# Patient Record
Sex: Male | Born: 1940 | Race: White | Hispanic: No | Marital: Married | Smoking: Never smoker
Health system: Southern US, Community
[De-identification: ages and names within clinical notes are randomized; demographics above are authoritative.]

## PROBLEM LIST (undated history)

## (undated) DIAGNOSIS — Z950 Presence of cardiac pacemaker: Secondary | ICD-10-CM

## (undated) DIAGNOSIS — I340 Nonrheumatic mitral (valve) insufficiency: Secondary | ICD-10-CM

## (undated) DIAGNOSIS — I341 Nonrheumatic mitral (valve) prolapse: Secondary | ICD-10-CM

## (undated) DIAGNOSIS — M1990 Unspecified osteoarthritis, unspecified site: Secondary | ICD-10-CM

## (undated) DIAGNOSIS — I10 Essential (primary) hypertension: Secondary | ICD-10-CM

## (undated) DIAGNOSIS — E785 Hyperlipidemia, unspecified: Secondary | ICD-10-CM

## (undated) DIAGNOSIS — F0280 Dementia in other diseases classified elsewhere without behavioral disturbance: Secondary | ICD-10-CM

## (undated) DIAGNOSIS — R55 Syncope and collapse: Secondary | ICD-10-CM

## (undated) DIAGNOSIS — G4733 Obstructive sleep apnea (adult) (pediatric): Secondary | ICD-10-CM

## (undated) DIAGNOSIS — J45909 Unspecified asthma, uncomplicated: Secondary | ICD-10-CM

## (undated) DIAGNOSIS — R011 Cardiac murmur, unspecified: Secondary | ICD-10-CM

## (undated) HISTORY — PX: CATARACT EXTRACTION, BILATERAL: SHX1313

## (undated) HISTORY — DX: Obstructive sleep apnea (adult) (pediatric): G47.33

## (undated) HISTORY — DX: Nonrheumatic mitral (valve) prolapse: I34.1

## (undated) HISTORY — PX: TONSILLECTOMY: SUR1361

## (undated) HISTORY — DX: Hyperlipidemia, unspecified: E78.5

## (undated) HISTORY — PX: TOTAL KNEE ARTHROPLASTY: SHX125

## (undated) HISTORY — PX: HERNIA REPAIR: SHX51

## (undated) HISTORY — PX: TONSILECTOMY, ADENOIDECTOMY, BILATERAL MYRINGOTOMY AND TUBES: SHX2538

## (undated) HISTORY — DX: Nonrheumatic mitral (valve) insufficiency: I34.0

---

## 1998-02-07 ENCOUNTER — Encounter: Payer: Self-pay | Admitting: Orthopedic Surgery

## 1998-02-07 ENCOUNTER — Ambulatory Visit (HOSPITAL_COMMUNITY): Admission: RE | Admit: 1998-02-07 | Discharge: 1998-02-07 | Payer: Self-pay | Admitting: Orthopedic Surgery

## 1998-02-09 ENCOUNTER — Encounter: Payer: Self-pay | Admitting: Orthopedic Surgery

## 2002-03-01 ENCOUNTER — Ambulatory Visit (HOSPITAL_BASED_OUTPATIENT_CLINIC_OR_DEPARTMENT_OTHER): Admission: RE | Admit: 2002-03-01 | Discharge: 2002-03-01 | Payer: Self-pay | Admitting: Orthopedic Surgery

## 2008-04-26 ENCOUNTER — Encounter (INDEPENDENT_AMBULATORY_CARE_PROVIDER_SITE_OTHER): Payer: Self-pay | Admitting: *Deleted

## 2010-03-29 ENCOUNTER — Encounter: Payer: Self-pay | Admitting: Occupational Medicine

## 2010-07-31 ENCOUNTER — Encounter: Payer: Self-pay | Admitting: *Deleted

## 2010-07-31 ENCOUNTER — Other Ambulatory Visit: Payer: Self-pay | Admitting: *Deleted

## 2010-07-31 DIAGNOSIS — E785 Hyperlipidemia, unspecified: Secondary | ICD-10-CM | POA: Insufficient documentation

## 2010-07-31 DIAGNOSIS — I341 Nonrheumatic mitral (valve) prolapse: Secondary | ICD-10-CM | POA: Insufficient documentation

## 2010-07-31 DIAGNOSIS — G4700 Insomnia, unspecified: Secondary | ICD-10-CM

## 2010-07-31 NOTE — Telephone Encounter (Signed)
Faxed signed rx back

## 2010-08-01 ENCOUNTER — Ambulatory Visit (INDEPENDENT_AMBULATORY_CARE_PROVIDER_SITE_OTHER): Payer: PRIVATE HEALTH INSURANCE | Admitting: Cardiology

## 2010-08-01 ENCOUNTER — Encounter: Payer: Self-pay | Admitting: Cardiology

## 2010-08-01 VITALS — BP 124/76 | HR 54 | Wt 167.0 lb

## 2010-08-01 DIAGNOSIS — I059 Rheumatic mitral valve disease, unspecified: Secondary | ICD-10-CM

## 2010-08-01 DIAGNOSIS — E785 Hyperlipidemia, unspecified: Secondary | ICD-10-CM

## 2010-08-01 DIAGNOSIS — I4510 Unspecified right bundle-branch block: Secondary | ICD-10-CM

## 2010-08-01 DIAGNOSIS — I341 Nonrheumatic mitral (valve) prolapse: Secondary | ICD-10-CM

## 2010-08-01 MED ORDER — ZOLPIDEM TARTRATE 10 MG PO TABS: 10.0000 mg | ORAL_TABLET | Freq: Every evening | ORAL | Status: DC | PRN

## 2010-08-01 NOTE — Progress Notes (Signed)
Alexander [name] Date of Birth:  23-Sep-1940 [name] / [name] [address]   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  The patient has a long history of known mitral valve prolapse with midsystolic click and late systolic murmur.  His last echocardiogram 08/31/03 showed mitral valve prolapse of the anterior leaflet with mild mitral regurgitation.  He has normal LV systolic and diastolic function.  The patient does not have any history of ischemic heart disease.  He had a normal stress Cardiolite on 11/21/1998.  The patient exercises regularly.  He's having no cardiovascular symptoms.  He does have a history of elevated serum cholesterol and previously had been on Lipitor which was stopped because of myalgias in the legs.  The patient is attempting to control his cholesterol with diet and exercise.The patient Had an EKG today which shows sinus bradycardia and a right bundle branch block.  The right bundle branch block is new since 2009.  Current Outpatient Prescriptions  Medication Sig Dispense Refill  . aspirin 81 MG tablet Take 81 mg by mouth daily.        . Multiple Vitamin (MULTIVITAMIN) tablet Take 1 tablet by mouth daily. Taking occ.      . Omega-3 Fatty Acids (FISH OIL PO) Take by mouth.        . zolpidem (AMBIEN) 10 MG tablet Take 1 tablet (10 mg total) by mouth at bedtime as needed.  30 tablet  3  . atorvastatin (LIPITOR) 10 MG tablet Take 10 mg by mouth daily.          Allergies  Allergen Reactions  . Nuts     hazelnuts    Patient Active Problem List  Diagnoses  . MVP (mitral valve prolapse)  . Hyperlipidemia  . Right bundle branch block    History  Smoking status  . Never Smoker   Smokeless tobacco  . Not on file    History  Alcohol Use     No family history on file.  Review of Systems: The patient denies any heat or  cold intolerance.  No weight gain or weight loss.  The patient denies headaches or blurry vision.  There is no cough or sputum production.  The patient denies dizziness.  There is no hematuria or hematochezia.  The patient denies any muscle aches or arthritis.  The patient denies any rash.  The patient denies frequent falling or instability.  There is no history of depression or anxiety.  All other systems were reviewed and are negative.   Physical Exam: Filed Vitals:   08/01/10 1048  BP: 124/76  Pulse: 54  The general appearance reveals a well-developed well-nourished gentleman in no distress.Pupils equal and reactive.   Extraocular Movements are full.  There is no scleral icterus.  The mouth and pharynx are normal.  The neck is supple.  The carotids reveal no bruits.  The jugular venous pressure is normal.  The thyroid is not enlarged.  There is no lymphadenopathy.The chest is clear to percussion and auscultation. There are no rales or rhonchi. Expansion of the chest is symmetrical.The precordium is quiet.  The first Sound is normal second sound is physiologically split he has a midsystolic click with a grade 2/6 late systolic murmur of mitral valve regurgitation.  No diastolic murmur.  No gallop or rub.The abdomen  is soft and nontender. Bowel sounds are normal. The liver and spleen are not enlarged. There Are no abdominal masses. There are no bruits.The pedal pulses are good.  There is no phlebitis or edema.  There is no cyanosis or clubbing.Strength is normal and symmetrical in all extremities.  There is no lateralizing weakness.  There are no sensory deficits.The skin is warm and dry.  There is no rash.  EKG shows sinus bradycardia and right bundle branch block.  The QRS axis is normal.    Assessment / Plan: Continue same medication which is daily baby aspirin.  No new medications prescribed.  The patient will get a chest x-ray at [name] and send [place] the results.  Recheck here in one  year for office visit and EKG

## 2010-08-01 NOTE — Assessment & Plan Note (Signed)
The patient brought in recent lab work.  He does have a history of hypercholesterolemia.  He is intolerant of statins which caused leg discomfort.  His total cholesterol is 245 with LDL of 133 but his HDL is 99.  Triglycerides are 65.  Thyroid function is normal.  He tries to maintain his ideal body weight and also watch his diet carefully.  He has elected not to try other statins.  His family history is negative for premature coronary disease.

## 2010-08-01 NOTE — Assessment & Plan Note (Signed)
The patient has a long history of mitral valve prolapse.  He has not been experiencing any symptoms relative to his mitral valve prolapse.  He denies any palpitations dizziness or syncope.  He has good exercise tolerance and no chest pain or shortness of breath.  He exercises regularly by going to the gym and he also plays golf twice a week.

## 2010-08-01 NOTE — Assessment & Plan Note (Signed)
Routine EKG today shows sinus bradycardia and he has a new right bundle-branch block not present on the previous EKG in 2009.  The patient is having no symptoms referable to his right bundle-branch block

## 2010-08-09 ENCOUNTER — Encounter: Payer: Self-pay | Admitting: Diagnostic Radiology

## 2011-03-12 ENCOUNTER — Other Ambulatory Visit: Payer: Self-pay

## 2011-03-12 DIAGNOSIS — G4700 Insomnia, unspecified: Secondary | ICD-10-CM

## 2011-03-12 MED ORDER — ZOLPIDEM TARTRATE 10 MG PO TABS: 10.0000 mg | ORAL_TABLET | Freq: Every evening | ORAL | Status: DC | PRN

## 2011-03-14 ENCOUNTER — Other Ambulatory Visit: Payer: Self-pay | Admitting: *Deleted

## 2011-03-14 DIAGNOSIS — G4700 Insomnia, unspecified: Secondary | ICD-10-CM

## 2011-03-14 MED ORDER — ZOLPIDEM TARTRATE 10 MG PO TABS: 10.0000 mg | ORAL_TABLET | Freq: Every evening | ORAL | Status: DC | PRN

## 2011-03-14 NOTE — Telephone Encounter (Signed)
Refill on zolpidem 

## 2011-07-30 ENCOUNTER — Other Ambulatory Visit: Payer: Self-pay | Admitting: *Deleted

## 2011-07-30 DIAGNOSIS — G4700 Insomnia, unspecified: Secondary | ICD-10-CM

## 2011-08-04 MED ORDER — ZOLPIDEM TARTRATE 10 MG PO TABS: 10.0000 mg | ORAL_TABLET | Freq: Every evening | ORAL | Status: DC | PRN

## 2011-09-03 ENCOUNTER — Ambulatory Visit (INDEPENDENT_AMBULATORY_CARE_PROVIDER_SITE_OTHER): Payer: PRIVATE HEALTH INSURANCE | Admitting: Cardiology

## 2011-09-03 ENCOUNTER — Encounter: Payer: Self-pay | Admitting: Cardiology

## 2011-09-03 VITALS — BP 126/78 | Ht 69.0 in | Wt 173.0 lb

## 2011-09-03 DIAGNOSIS — I4510 Unspecified right bundle-branch block: Secondary | ICD-10-CM

## 2011-09-03 DIAGNOSIS — I059 Rheumatic mitral valve disease, unspecified: Secondary | ICD-10-CM

## 2011-09-03 DIAGNOSIS — E785 Hyperlipidemia, unspecified: Secondary | ICD-10-CM

## 2011-09-03 DIAGNOSIS — I341 Nonrheumatic mitral (valve) prolapse: Secondary | ICD-10-CM

## 2011-09-03 NOTE — Assessment & Plan Note (Signed)
He brought in copies of his recent lab work for [place] to look at.  Although his LDL is slightly high, he has a very high HDL.  She will continue on careful diet and we do not need to add another statin at this point

## 2011-09-03 NOTE — Progress Notes (Signed)
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  He has a past history of mitral valve prolapse which is asymptomatic.  He has a history of right bundle branch block on his EKG.  He had an echocardiogram in 08/31/03 showing mitral valve prolapse of the anterior leaflet with mild mitral regurgitation and with normal LV systolic and normal diastolic function.  He had a normal stress Cardiolite on 11/21/1998.  He has had a previously elevated cholesterol but is intolerant of Lipitor and he is controlling his cholesterol with diet and exercise.  Current Outpatient Prescriptions  Medication Sig Dispense Refill  . aspirin 81 MG tablet Take 81 mg by mouth daily.        . Multiple Vitamin (MULTIVITAMIN) tablet Take 1 tablet by mouth daily. Taking occ.      . Omega-3 Fatty Acids (FISH OIL PO) Take by mouth.        . zolpidem (AMBIEN) 10 MG tablet Take 1 tablet (10 mg total) by mouth at bedtime as needed.  90 tablet  1    Allergies  Allergen Reactions  . Nuts     hazelnuts    Patient Active Problem List  Diagnosis  . MVP (mitral valve prolapse)  . Hyperlipidemia  . Right bundle branch block    History  Smoking status  . Never Smoker   Smokeless tobacco  . Not on file    History  Alcohol Use     No family history on file.  Review of Systems: The patient denies any heat or cold intolerance.  No weight gain or weight loss.  The patient denies headaches or blurry vision.  There is no cough or sputum production.  The patient denies dizziness.  There is no hematuria or hematochezia.  The patient denies any muscle aches or arthritis.  The patient denies any rash.  The patient denies frequent falling or instability.  There is no history of depression or anxiety.  All other systems were reviewed and  are negative.   Physical Exam: Filed Vitals:   09/03/11 1124  BP: 126/78   the general appearance reveals a well-developed well-nourished gentleman in no distress.The head and neck exam reveals pupils equal and reactive.  Extraocular movements are full.  There is no scleral icterus.  The mouth and pharynx are normal.  The neck is supple.  The carotids reveal no bruits.  The jugular venous pressure is normal.  The  thyroid is not enlarged.  There is no lymphadenopathy.  The chest is clear to percussion and auscultation.  There are no rales or rhonchi.  Expansion of the chest is symmetrical.  The precordium is quiet.  The first heart sound is normal.  The second heart sound is physiologically split.  There is a sharp midsystolic click followed by a late systolic apical murmur of mitral valve prolapse.  There is no abnormal lift or heave.  The abdomen is soft and nontender.  The bowel sounds are normal.  The liver and spleen are not enlarged.  There are no abdominal masses.  There are no abdominal bruits.  Extremities reveal good pedal pulses.  There is no phlebitis or edema.  There is no cyanosis or clubbing.  Strength is normal and symmetrical in all extremities.  There is no lateralizing weakness.  There are no sensory  deficits.  The skin is warm and dry.  There is no rash.   EKG shows sinus bradycardia and right bundle branch block, unchanged.  Assessment / Plan: Continue same medication.  Recheck in one year for office visit and EKG and we will plan to get a followup chest x-ray after his next visit also.  His last x-ray was done in the [facility]

## 2011-09-03 NOTE — Assessment & Plan Note (Signed)
The patient has not been having any cardiac symptoms.  He works out regularly at [employer] with no problems.  He plays golf on his afternoons off.  He is still working part-time up in the [employer]

## 2011-09-03 NOTE — Patient Instructions (Addendum)
Your physician recommends that you continue on your current medications as directed. Please refer to the Current Medication list given to you today.  Your physician wants you to follow-up in: 1 year. You will receive a reminder letter in the mail two months in advance. If you don't receive a letter, please call our office to schedule the follow-up appointment.  

## 2011-09-09 ENCOUNTER — Encounter: Payer: Self-pay | Admitting: Cardiology

## 2012-01-22 ENCOUNTER — Ambulatory Visit (AMBULATORY_SURGERY_CENTER): Payer: PRIVATE HEALTH INSURANCE | Admitting: *Deleted

## 2012-01-22 VITALS — Ht 70.0 in | Wt 180.0 lb

## 2012-01-22 DIAGNOSIS — Z1211 Encounter for screening for malignant neoplasm of colon: Secondary | ICD-10-CM

## 2012-01-22 MED ORDER — MOVIPREP 100 G PO SOLR: ORAL | Status: DC

## 2012-01-28 ENCOUNTER — Other Ambulatory Visit: Payer: Self-pay | Admitting: *Deleted

## 2012-01-28 DIAGNOSIS — G4700 Insomnia, unspecified: Secondary | ICD-10-CM

## 2012-01-28 MED ORDER — ZOLPIDEM TARTRATE 10 MG PO TABS: 10.0000 mg | ORAL_TABLET | Freq: Every evening | ORAL | Status: DC | PRN

## 2012-01-28 NOTE — Telephone Encounter (Signed)
Patient's wife called for refill of Ambien from Dr Patty Sermons. Will forward request to nurse and Dr for further rx approval.   Amado Coe, CMA

## 2012-02-12 ENCOUNTER — Ambulatory Visit (AMBULATORY_SURGERY_CENTER): Payer: PRIVATE HEALTH INSURANCE | Admitting: Internal Medicine

## 2012-02-12 ENCOUNTER — Other Ambulatory Visit: Payer: Self-pay

## 2012-02-12 ENCOUNTER — Encounter: Payer: Self-pay | Admitting: Internal Medicine

## 2012-02-12 VITALS — BP 125/73 | HR 55 | Temp 96.6°F | Resp 12 | Ht 70.0 in | Wt 180.0 lb

## 2012-02-12 DIAGNOSIS — Z1211 Encounter for screening for malignant neoplasm of colon: Secondary | ICD-10-CM

## 2012-02-12 MED ORDER — PEG-KCL-NACL-NASULF-NA ASC-C 100 G PO SOLR: 1.0000 | Freq: Once | ORAL | Status: AC

## 2012-02-12 MED ORDER — SODIUM CHLORIDE 0.9 % IV SOLN: 500.0000 mL | INTRAVENOUS | Status: DC

## 2012-02-12 NOTE — Progress Notes (Signed)
1027 a/ox3 pleased report to [employer]

## 2012-02-12 NOTE — Progress Notes (Signed)
No complaints noted in the recovery room. Maw   

## 2012-02-12 NOTE — Progress Notes (Signed)
Patient did not experience any of the following events: a burn prior to discharge; a fall within the facility; wrong site/side/patient/procedure/implant event; or a hospital transfer or hospital admission upon discharge from the facility. (G8907) Patient did not have preoperative order for IV antibiotic SSI prophylaxis. (G8918)  

## 2012-02-12 NOTE — Patient Instructions (Addendum)
Handouts were given to your care partner on diverticulosis and high fiber diet.  You may resume your current medications today.  Please call if any questions or concerns.    YOU HAD AN ENDOSCOPIC PROCEDURE TODAY AT THE [facility]: Refer to the procedure report that was given to you for any specific questions about what was found during the examination.  If the procedure report does not answer your questions, please call your gastroenterologist to clarify.  If you requested that your care partner not be given the details of your procedure findings, then the procedure report has been included in a sealed envelope for you to review at your convenience later.  YOU SHOULD EXPECT: Some feelings of bloating in the abdomen. Passage of more gas than usual.  Walking can help get rid of the air that was put into your GI tract during the procedure and reduce the bloating. If you had a lower endoscopy (such as a colonoscopy or flexible sigmoidoscopy) you may notice spotting of blood in your stool or on the toilet paper. If you underwent a bowel prep for your procedure, then you may not have a normal bowel movement for a few days.  DIET: Your first meal following the procedure should be a light meal and then it is ok to progress to your normal diet.  A half-sandwich or bowl of soup is an example of a good first meal.  Heavy or fried foods are harder to digest and may make you feel nauseous or bloated.  Likewise meals heavy in dairy and vegetables can cause extra gas to form and this can also increase the bloating.  Drink plenty of fluids but you should avoid alcoholic beverages for 24 hours.  ACTIVITY: Your care partner should take you home directly after the procedure.  You should plan to take it easy, moving slowly for the rest of the day.  You can resume normal activity the day after the procedure however you should NOT DRIVE or use heavy machinery for 24 hours (because of the sedation medicines used  during the test).    SYMPTOMS TO REPORT IMMEDIATELY: A gastroenterologist can be reached at any hour.  During normal business hours, 8:30 AM to 5:00 PM Monday through Friday, call [phone].  After hours and on weekends, please call the GI answering service at [phone] who will take a message and have the physician on call contact you.   Following lower endoscopy (colonoscopy or flexible sigmoidoscopy):  Excessive amounts of blood in the stool  Significant tenderness or worsening of abdominal pains  Swelling of the abdomen that is new, acute  Fever of 100F or higher    FOLLOW UP: If any biopsies were taken you will be contacted by phone or by letter within the next 1-3 weeks.  Call your gastroenterologist if you have not heard about the biopsies in 3 weeks.  Our staff will call the home number listed on your records the next business day following your procedure to check on you and address any questions or concerns that you may have at that time regarding the information given to you following your procedure. This is a courtesy call and so if there is no answer at the home number and we have not heard from you through the emergency physician on call, we will assume that you have returned to your regular daily activities without incident.  SIGNATURES/CONFIDENTIALITY: You and/or your care partner have signed paperwork which will be entered into your   electronic medical record.  These signatures attest to the fact that that the information above on your After Visit Summary has been reviewed and is understood.  Full responsibility of the confidentiality of this discharge information lies with you and/or your care-partner. 

## 2012-02-12 NOTE — Op Note (Signed)
[facility] [address] [place], [address]   COLONOSCOPY PROCEDURE REPORT  PATIENT: Alexander [name], Alexander [name]  MR#: [id number] BIRTHDATE: 17-Apr-1940 , 71  yrs. old GENDER: Male ENDOSCOPIST: Hart Carwin, MD REFERRED BY:  recall colonoscopy PROCEDURE DATE:  02/12/2012 PROCEDURE:   Colonoscopy, screening ASA CLASS:   Class I INDICATIONS:Average risk patient for colon cancer and prior screening colonoscopy in 06/2001 was normal. MEDICATIONS: MAC sedation, administered by CRNA and Propofol (Diprivan) 330 mg IV  DESCRIPTION OF PROCEDURE:   After the risks and benefits and of the procedure were explained, informed consent was obtained.  A digital rectal exam revealed no abnormalities of the rectum.    The LB [id number]  endoscope was introduced through the anus and advanced to the cecum, which was identified by both the appendix and ileocecal valve .  The quality of the prep was excellent, using MoviPrep .  The instrument was then slowly withdrawn as the colon was fully examined.     COLON FINDINGS: Mild diverticulosis was noted in the sigmoid colon. Retroflexed views revealed no abnormalities.     The scope was then withdrawn from the patient and the procedure completed.  COMPLICATIONS: There were no complications. ENDOSCOPIC IMPRESSION: Mild diverticulosis was noted in the sigmoid colon , several shallow diverticuli  RECOMMENDATIONS: High fiber diet   REPEAT EXAM: In 10 year(s)  for Colonoscopy.  cc:  _______________________________ eSignedHart Carwin, MD 02/12/2012 10:28 AM

## 2012-02-13 ENCOUNTER — Telehealth: Payer: Self-pay | Admitting: *Deleted

## 2012-02-13 NOTE — Telephone Encounter (Signed)
No answer, left message to call if questions or concerns. 

## 2012-07-22 ENCOUNTER — Other Ambulatory Visit: Payer: Self-pay | Admitting: *Deleted

## 2012-07-22 DIAGNOSIS — G4700 Insomnia, unspecified: Secondary | ICD-10-CM

## 2012-07-22 MED ORDER — ZOLPIDEM TARTRATE 10 MG PO TABS: 10.0000 mg | ORAL_TABLET | Freq: Every evening | ORAL | Status: DC | PRN

## 2012-09-16 ENCOUNTER — Ambulatory Visit (INDEPENDENT_AMBULATORY_CARE_PROVIDER_SITE_OTHER): Payer: PRIVATE HEALTH INSURANCE | Admitting: Cardiology

## 2012-09-16 ENCOUNTER — Encounter: Payer: Self-pay | Admitting: Cardiology

## 2012-09-16 VITALS — BP 126/78 | HR 60 | Ht 70.0 in | Wt 172.4 lb

## 2012-09-16 DIAGNOSIS — I341 Nonrheumatic mitral (valve) prolapse: Secondary | ICD-10-CM

## 2012-09-16 DIAGNOSIS — E785 Hyperlipidemia, unspecified: Secondary | ICD-10-CM

## 2012-09-16 DIAGNOSIS — I059 Rheumatic mitral valve disease, unspecified: Secondary | ICD-10-CM

## 2012-09-16 DIAGNOSIS — I4510 Unspecified right bundle-branch block: Secondary | ICD-10-CM

## 2012-09-16 NOTE — Assessment & Plan Note (Signed)
Patient is intolerant of statins.  He is maintaining his cholesterol levels with careful diet and exercise.  His weight is down 1 pound since last visit.  He will be getting fasting lab work next week and he will get [place] the results.

## 2012-09-16 NOTE — Progress Notes (Signed)
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 He has a past history of mitral valve prolapse which is asymptomatic. He has a history of right bundle branch block on his EKG. He had an echocardiogram in 08/31/03 showing mitral valve prolapse of the anterior leaflet with mild mitral regurgitation and with normal LV systolic and normal diastolic function. He had a normal stress Cardiolite on 11/21/1998. He has had a previously elevated cholesterol but is intolerant of Lipitor and he is controlling his cholesterol with diet and exercise.  Since last visit he has had no new cardiac symptoms.  He is working part-time 3 days a week in [employer] in [place].  Current Outpatient Prescriptions  Medication Sig Dispense Refill  . ibuprofen (ADVIL,MOTRIN) 200 MG tablet Take 400 mg by mouth as needed.      . Multiple Vitamin (MULTIVITAMIN) tablet Take 1 tablet by mouth daily. Taking occ.      . zolpidem (AMBIEN) 10 MG tablet Take 1 tablet (10 mg total) by mouth at bedtime as needed.  90 tablet  1   No current facility-administered medications for this visit.    Allergies  Allergen Reactions  . Nuts     hazelnuts    Patient Active Problem List   Diagnosis Date Noted  . Right bundle branch block 08/01/2010  . MVP (mitral valve prolapse)   . Hyperlipidemia     History  Smoking status  . Never Smoker   Smokeless tobacco  . Never Used    History  Alcohol Use  . 3.0 oz/week  . 5 Glasses of wine per week    No family history on file.  Review of Systems: The patient denies any heat or cold intolerance.  No weight gain or weight loss.  The patient denies headaches or blurry vision.  There is no cough or sputum production.  The patient denies dizziness.  There is no hematuria or  hematochezia.  The patient denies any muscle aches or arthritis.  The patient denies any rash.  The patient denies frequent falling or instability.  There is no history of depression or anxiety.  All other systems were reviewed and are negative.   Physical Exam: Filed Vitals:   09/16/12 0918  BP: 126/78  Pulse: 60   the general appearance reveals a well-developed well-nourished gentleman in no distress.The head and neck exam reveals pupils equal and reactive.  Extraocular movements are full.  There is no scleral icterus.  The mouth and pharynx are normal.  The neck is supple.  The carotids reveal no bruits.  The jugular venous pressure is normal.  The  thyroid is not enlarged.  There is no lymphadenopathy.  The chest is clear to percussion and auscultation.  There are no rales or rhonchi.  Expansion of the chest is symmetrical.  The precordium is quiet.  The first heart sound is normal.  The second heart sound is physiologically split.  There is a short midsystolic click followed by a grade 2/6 late systolic murmur of mitral valve prolapse. There is no abnormal lift or heave.  The abdomen is soft and nontender.  The bowel sounds are normal.  The liver and spleen are not enlarged.  There are no abdominal masses.  There are no abdominal bruits.  Extremities reveal good pedal pulses.  There  is no phlebitis or edema.  There is no cyanosis or clubbing.  Strength is normal and symmetrical in all extremities.  There is no lateralizing weakness.  There are no sensory deficits.  The skin is warm and dry.  There is no rash.   EKG shows normal sinus rhythm at 60 per minute.  There is a pattern of right bundle branch block unchanged since 09/03/11   Assessment / Plan: Continue on same regimen.  Recheck in one year for followup office visit and EKG.

## 2012-09-16 NOTE — Patient Instructions (Addendum)
Your physician recommends that you continue on your current medications as directed. Please refer to the Current Medication list given to you today.  Your physician wants you to follow-up in: 1 year. You will receive a reminder letter in the mail two months in advance. If you don't receive a letter, please call our office to schedule the follow-up appointment.  

## 2012-09-16 NOTE — Assessment & Plan Note (Signed)
No chest pain or shortness of breath.  No palpitations.  No dizziness or syncope.  He exercises vigorously without symptoms.  He uses an elliptical machine several times a week as well as free weights.  No treadmill because of a bad knee.

## 2012-09-16 NOTE — Assessment & Plan Note (Signed)
The patient has not been experiencing any symptoms from his right bundle branch block.  No palpitations or PVCs

## 2013-01-07 ENCOUNTER — Other Ambulatory Visit: Payer: Self-pay | Admitting: *Deleted

## 2013-01-07 DIAGNOSIS — G4700 Insomnia, unspecified: Secondary | ICD-10-CM

## 2013-01-07 MED ORDER — ZOLPIDEM TARTRATE 10 MG PO TABS: 10.0000 mg | ORAL_TABLET | Freq: Every evening | ORAL | Status: DC | PRN

## 2013-07-12 ENCOUNTER — Telehealth: Payer: Self-pay | Admitting: *Deleted

## 2013-07-12 DIAGNOSIS — G4700 Insomnia, unspecified: Secondary | ICD-10-CM

## 2013-07-12 NOTE — Telephone Encounter (Signed)
[facility] requests ambien refill for patient. Thanks, MI

## 2013-07-14 MED ORDER — ZOLPIDEM TARTRATE 10 MG PO TABS: 10.0000 mg | ORAL_TABLET | Freq: Every evening | ORAL | Status: DC | PRN

## 2013-07-15 ENCOUNTER — Other Ambulatory Visit: Payer: Self-pay | Admitting: *Deleted

## 2013-07-15 DIAGNOSIS — G4700 Insomnia, unspecified: Secondary | ICD-10-CM

## 2013-07-15 MED ORDER — ZOLPIDEM TARTRATE 10 MG PO TABS: 10.0000 mg | ORAL_TABLET | Freq: Every evening | ORAL | Status: DC | PRN

## 2013-09-02 ENCOUNTER — Encounter: Payer: Self-pay | Admitting: Cardiology

## 2013-09-07 ENCOUNTER — Encounter: Payer: Self-pay | Admitting: Cardiology

## 2013-09-07 ENCOUNTER — Ambulatory Visit (INDEPENDENT_AMBULATORY_CARE_PROVIDER_SITE_OTHER): Payer: PRIVATE HEALTH INSURANCE | Admitting: Cardiology

## 2013-09-07 VITALS — BP 126/76 | HR 62 | Ht 70.0 in | Wt 172.0 lb

## 2013-09-07 DIAGNOSIS — I059 Rheumatic mitral valve disease, unspecified: Secondary | ICD-10-CM

## 2013-09-07 DIAGNOSIS — E785 Hyperlipidemia, unspecified: Secondary | ICD-10-CM

## 2013-09-07 DIAGNOSIS — I341 Nonrheumatic mitral (valve) prolapse: Secondary | ICD-10-CM

## 2013-09-07 DIAGNOSIS — I4510 Unspecified right bundle-branch block: Secondary | ICD-10-CM

## 2013-09-07 NOTE — Assessment & Plan Note (Signed)
He is intolerant of statins.  His LDL remains elevated but his HDL is also high.  Continue prudent diet and exercise.  His weight is unchanged from last year

## 2013-09-07 NOTE — Assessment & Plan Note (Signed)
EKG today shows right bundle branch block unchanged.  No symptoms.

## 2013-09-07 NOTE — Assessment & Plan Note (Signed)
The patient has not been having any palpitations or chest pain.  No shortness of breath

## 2013-09-07 NOTE — Patient Instructions (Signed)
Your physician recommends that you continue on your current medications as directed. Please refer to the Current Medication list given to you today.  Your physician wants you to follow-up in: 1 year ov You will receive a reminder letter in the mail two months in advance. If you don't receive a letter, please call our office to schedule the follow-up appointment.  

## 2013-09-07 NOTE — Progress Notes (Signed)
Deatra Canter Date of Birth:  25-Aug-1940 [facility] [address] 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 He has a past history of mitral valve prolapse which is asymptomatic. He has a history of right bundle branch block on his EKG. He had an echocardiogram in 08/31/03 showing mitral valve prolapse of the anterior leaflet with mild mitral regurgitation and with normal LV systolic and normal diastolic function. He had a normal stress Cardiolite on 11/21/1998. He has had a previously elevated cholesterol but is intolerant of Lipitor and he is controlling his cholesterol with diet and exercise. Since last visit he has had no new cardiac symptoms. 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  Since last visit he has been in good health.  He saw a urologist about a rising PSA which came back down after a six-week treatment with antibiotics and Flomax. The patient has had some problems with his left elbow and is scheduled for orthopedic surgery on his left elbow tomorrow to be done by Dr. Amedeo Plenty.   Current Outpatient Prescriptions  Medication Sig Dispense Refill  . ibuprofen (ADVIL,MOTRIN) 200 MG tablet Take 400 mg by mouth as needed.      . Multiple Vitamin (MULTI VITAMIN DAILY PO) Take by mouth.      . zolpidem (AMBIEN) 10 MG tablet Take 1 tablet (10 mg total) by mouth at bedtime as needed.  90 tablet  1   No current facility-administered medications for this visit.    Allergies  Allergen Reactions  . Nuts     hazelnuts    Patient Active Problem List   Diagnosis Date Noted  . Right bundle branch block 08/01/2010  . MVP (mitral valve prolapse)   . Hyperlipidemia     History  Smoking status  . Never Smoker   Smokeless tobacco  . Never Used    History  Alcohol Use  . 3.0 oz/week  . 5 Glasses of wine per week     No family history on file.  Review of Systems: The patient denies any heat or cold intolerance.  No weight gain or weight loss.  The patient denies headaches or blurry vision.  There is no cough or sputum production.  The patient denies dizziness.  There is no hematuria or hematochezia.  The patient denies any muscle aches or arthritis.  The patient denies any rash.  The patient denies frequent falling or instability.  There is no history of depression or anxiety.  All other systems were reviewed and are negative.   Physical Exam: Filed Vitals:   09/07/13 1010  BP: 126/76  Pulse: 62   the general appearance reveals a well-developed well-nourished gentleman in no distress.The head and neck exam reveals pupils equal and reactive.  Extraocular movements are full.  There is no scleral icterus.  The mouth and pharynx are normal.  The neck is supple.  The carotids reveal no bruits.  The jugular venous pressure is normal.  The  thyroid is not enlarged.  There is no lymphadenopathy.  The chest is clear to percussion and auscultation.  There are no rales or rhonchi.  Expansion of the chest is symmetrical.  The precordium is quiet.  The first heart sound is normal.  The second heart sound is physiologically split.  There is a midsystolic click with grade 2/6 late  systolic murmur of mitral regurgitation consistent with mitral valve prolapse.  There is no abnormal lift or heave.  The abdomen is soft and nontender.  The bowel sounds are normal.  The liver and spleen are not enlarged.  There are no abdominal masses.  There are no abdominal bruits.  Extremities reveal good pedal pulses.  There is no phlebitis or edema.  There is no cyanosis or clubbing.  Strength is normal and symmetrical in all extremities.  There is no lateralizing weakness.  There are no sensory deficits.  The skin is warm and dry.  There is no rash.   EKG shows normal sinus rhythm and right bundle branch block and is unchanged from  09/20/12   Assessment / Plan: 1. mitral valve prolapse, asymptomatic 2. old right bundle branch 3. hypercholesterolemia.  Intolerant of statins. 4.  Left elbow pain.  Scheduled for surgery tomorrow.  Plan: Continue same medication.  Recheck in one year for office visit and EKG.  We also gave him a prescription for him to get a shingles shot and also a Prevnar 13 pneumonia shot at his convenience.

## 2014-03-09 ENCOUNTER — Other Ambulatory Visit: Payer: Self-pay | Admitting: Neurosurgery

## 2014-03-09 ENCOUNTER — Telehealth: Payer: Self-pay | Admitting: *Deleted

## 2014-03-09 ENCOUNTER — Other Ambulatory Visit: Payer: Self-pay | Admitting: Cardiology

## 2014-03-09 ENCOUNTER — Ambulatory Visit
Admission: RE | Admit: 2014-03-09 | Discharge: 2014-03-09 | Disposition: A | Discharge status: Home / Self Care | Payer: PRIVATE HEALTH INSURANCE | Source: Ambulatory Visit | Attending: Cardiology | Admitting: Cardiology

## 2014-03-09 DIAGNOSIS — M545 Low back pain, unspecified: Secondary | ICD-10-CM

## 2014-03-09 MED ORDER — METHYLPREDNISOLONE (PAK) 4 MG PO TABS: ORAL_TABLET | ORAL | Status: DC

## 2014-03-09 NOTE — Telephone Encounter (Signed)
Dr. Mare Ferrari calls today to have pt scheduled for outpatient MRI of the lumbar spine. Diagnosis: acute lumbar sciatica. Dr. Mare Ferrari ordered medrol dose pack to pt pharmacy Alexander [name]  I spoke with pt & he is aware of the MRI & the medrol dose pack.  Order placed & call to patient. Alexander Chin RN

## 2014-03-13 ENCOUNTER — Other Ambulatory Visit: Payer: Self-pay | Admitting: Neurosurgery

## 2014-03-13 DIAGNOSIS — M545 Low back pain, unspecified: Secondary | ICD-10-CM

## 2014-03-14 ENCOUNTER — Ambulatory Visit
Admission: RE | Admit: 2014-03-14 | Discharge: 2014-03-14 | Disposition: A | Discharge status: Home / Self Care | Payer: PRIVATE HEALTH INSURANCE | Source: Ambulatory Visit | Attending: Neurosurgery | Admitting: Neurosurgery

## 2014-03-14 DIAGNOSIS — M545 Low back pain, unspecified: Secondary | ICD-10-CM

## 2014-03-14 MED ORDER — METHYLPREDNISOLONE ACETATE 40 MG/ML INJ SUSP (RADIOLOG
120.0000 mg | Freq: Once | INTRAMUSCULAR | Status: AC
  Administered 2014-03-14: 120 mg via EPIDURAL

## 2014-03-14 MED ORDER — IOHEXOL 180 MG/ML  SOLN
1.0000 mL | Freq: Once | INTRAMUSCULAR | Status: AC | PRN
  Administered 2014-03-14: 1 mL via EPIDURAL

## 2014-03-14 NOTE — Discharge Instructions (Signed)

## 2014-03-15 ENCOUNTER — Other Ambulatory Visit: Payer: PRIVATE HEALTH INSURANCE

## 2014-03-17 ENCOUNTER — Inpatient Hospital Stay: Admission: RE | Admit: 2014-03-17 | Payer: PRIVATE HEALTH INSURANCE | Source: Ambulatory Visit

## 2014-04-18 ENCOUNTER — Other Ambulatory Visit: Payer: Self-pay | Admitting: Cardiology

## 2014-04-18 DIAGNOSIS — G4700 Insomnia, unspecified: Secondary | ICD-10-CM

## 2014-04-18 DIAGNOSIS — R110 Nausea: Secondary | ICD-10-CM

## 2014-04-18 MED ORDER — ONDANSETRON HCL 8 MG PO TABS: 8.0000 mg | ORAL_TABLET | Freq: Three times a day (TID) | ORAL | Status: DC | PRN

## 2014-04-18 MED ORDER — ZOLPIDEM TARTRATE 10 MG PO TABS: 10.0000 mg | ORAL_TABLET | Freq: Every evening | ORAL | Status: DC | PRN

## 2014-04-18 NOTE — Telephone Encounter (Signed)
Patient has been having nausea recently with pain medication he has been taking Discussed with  Dr. Mare Ferrari and ok to Rx Zofran 8 mg as needed for nausea and vomiting

## 2014-04-18 NOTE — Telephone Encounter (Signed)
New message      Talk to B and E.  She would not tell me what she wanted; however, she will not be available from 3-4:15.

## 2014-04-20 ENCOUNTER — Ambulatory Visit (INDEPENDENT_AMBULATORY_CARE_PROVIDER_SITE_OTHER): Payer: PRIVATE HEALTH INSURANCE | Admitting: Cardiology

## 2014-04-20 ENCOUNTER — Encounter: Payer: Self-pay | Admitting: Cardiology

## 2014-04-20 VITALS — BP 138/80 | HR 71 | Ht 70.0 in | Wt 170.0 lb

## 2014-04-20 DIAGNOSIS — I341 Nonrheumatic mitral (valve) prolapse: Secondary | ICD-10-CM

## 2014-04-20 NOTE — Patient Instructions (Signed)
Your physician recommends that you continue on your current medications as directed. Please refer to the Current Medication list given to you today.  Your physician wants you to follow-up in: 6 month ov/ekg  You will receive a reminder letter in the mail two months in advance. If you don't receive a letter, please call our office to schedule the follow-up appointment.   WATCH YOUR SALT INTAKE

## 2014-04-20 NOTE — Progress Notes (Signed)
Cardiology Office Note   Date:  04/20/2014   ID:  Alexander [name], DOB [birth year], MRN [id number]  PCP:  Alexander [name]  Cardiologist:   Alexander [name]   No chief complaint on file.     History of Present Illness: Alexander [name] is a 74 y.o. male who presents for a work in follow-up office visit.  He has a past history of mitral valve prolapse which is asymptomatic. He has a history of right bundle branch block on his EKG. He had an echocardiogram in 08/31/03 showing mitral valve prolapse of the anterior leaflet with mild mitral regurgitation and with normal LV systolic and normal diastolic function. He had a normal stress Cardiolite on 11/21/1998. He has had a previously elevated cholesterol but is intolerant of Lipitor and he is controlling his cholesterol with diet and exercise. Since last visit he has had no new cardiac symptoms. 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 Since we last saw him he developed severe back pain.  He was home at bedrest for 3 weeks and did not improve.  He finally underwent lumbar surgery by Dr. Vertell Limber with removal of a disc fragment that was pressing on the nerve.  During his illness he also has been on several rounds of Medrol Dosepak as well as epidural steroids.  He returned to work earlier this week.  He was found to have elevated blood pressure and comes in for evaluation.  Today his blood pressure is normal.  He is not on any blood pressure medications.  He has not been having any chest pain or shortness of breath.  He's been walking his dog about an hour a day for exercise.  His weight is down 2 pounds since last visit.  Past Medical History  Diagnosis Date  . MVP (mitral valve prolapse)     mild  . Hyperlipidemia     Past Surgical History  Procedure Laterality Date  . Tonsilectomy, adenoidectomy, bilateral myringotomy and tubes      as child  . Knee surgery      arthroscopic  left knee      Current Outpatient Prescriptions  Medication Sig Dispense Refill  . ibuprofen (ADVIL,MOTRIN) 200 MG tablet Take 400 mg by mouth as needed.    . Multiple Vitamin (MULTI VITAMIN DAILY PO) Take by mouth.    . ondansetron (ZOFRAN) 8 MG tablet Take 1 tablet (8 mg total) by mouth every 8 (eight) hours as needed for nausea or vomiting. 30 tablet 0  . zolpidem (AMBIEN) 10 MG tablet Take 1 tablet (10 mg total) by mouth at bedtime as needed. 90 tablet 1   No current facility-administered medications for this visit.    Allergies:   Nuts    Social History:  The patient  reports that he has never smoked. He has never used smokeless tobacco. He reports that he drinks about 3.0 oz of alcohol per week. He reports that he does not use illicit drugs.   Family History:  The patient's family history is not on file.    ROS:  Please see the history of present illness.   Otherwise, review of systems are positive for none.   All other systems are reviewed and negative.    PHYSICAL EXAM: VS:  BP 138/80 mmHg  Pulse 71  Ht 5\' 10"  (1.778 m)  Wt 170 lb (77.111 kg)  BMI 24.39 kg/m2 , BMI Body mass index is 24.39 kg/(m^2). GEN:  Well nourished, well developed, in no acute distress HEENT: normal Neck: no JVD, carotid bruits, or masses Cardiac: RRR; no , rubs, or gallops,no edema.  There is a grade 2/6 mid and late systolic murmur of mitral regurgitation at apex  Respiratory:  clear to auscultation bilaterally, normal work of breathing GI: soft, nontender, nondistended, + BS MS: no deformity or atrophy Skin: warm and dry, no rash Neuro:  Strength and sensation are intact Psych: euthymic mood, full affect   EKG:  EKG is ordered today. The ekg ordered today demonstrates normal sinus rhythm and right bundle branch block   Recent Labs: No results found for requested labs within last 365 days.    Lipid Panel No results found for: CHOL, TRIG, HDL, CHOLHDL, VLDL, LDLCALC, LDLDIRECT    Wt Readings  from Last 3 Encounters:  04/20/14 170 lb (77.111 kg)  09/07/13 172 lb (78.019 kg)  09/16/12 172 lb 6.4 oz (78.2 kg)         ASSESSMENT AND PLAN:  1.  Labile hypertension probably secondary to recent exogenous steroids, improving on its own as steroid effect wears off. 2.  Mitral valve prolapse 3.  Right bundle branch block, chronic 4.  Insomnia 5.  Hyperlipidemia   Current medicines are reviewed at length with the patient today.  The patient does not have concerns regarding medicines.  The following changes have been made:  no change  Labs/ tests ordered today include:  Orders Placed This Encounter  Procedures  . EKG 12-Lead     Disposition:   FU with Alexander [name] in 6 months for office visit and EKG.  Continue on a low salt diet.  Blood pressure will be expected to continue to improve without additional medication.   Signed, Alexander [name]  04/20/2014 1:36 PM    [facility] [place], [place], [place]  [address] Phone: [phone]; Fax: [phone]

## 2014-08-09 ENCOUNTER — Encounter: Payer: Self-pay | Admitting: Cardiology

## 2014-08-09 ENCOUNTER — Ambulatory Visit (INDEPENDENT_AMBULATORY_CARE_PROVIDER_SITE_OTHER): Payer: PRIVATE HEALTH INSURANCE | Admitting: Cardiology

## 2014-08-09 VITALS — BP 142/78 | HR 69 | Ht 70.0 in | Wt 172.1 lb

## 2014-08-09 DIAGNOSIS — E785 Hyperlipidemia, unspecified: Secondary | ICD-10-CM | POA: Diagnosis not present

## 2014-08-09 DIAGNOSIS — I10 Essential (primary) hypertension: Secondary | ICD-10-CM

## 2014-08-09 DIAGNOSIS — I341 Nonrheumatic mitral (valve) prolapse: Secondary | ICD-10-CM

## 2014-08-09 DIAGNOSIS — I4510 Unspecified right bundle-branch block: Secondary | ICD-10-CM | POA: Diagnosis not present

## 2014-08-09 MED ORDER — ROSUVASTATIN CALCIUM 5 MG PO TABS: 5.0000 mg | ORAL_TABLET | Freq: Every day | ORAL | Status: DC

## 2014-08-09 MED ORDER — LOSARTAN POTASSIUM 25 MG PO TABS: 25.0000 mg | ORAL_TABLET | Freq: Every day | ORAL | Status: DC

## 2014-08-09 NOTE — Progress Notes (Signed)
Cardiology Office Note   Date:  08/09/2014   ID:  Alexander [name], DOB 1940/12/12, MRN [id number]  PCP:  Warren Danes, MD  Cardiologist: Darlin Coco MD  No chief complaint on file.     History of Present Illness: Alexander [name] is a 74 y.o. male who presents for cardiology follow-up  He has a past history of mitral valve prolapse which is asymptomatic. He has a history of right bundle branch block on his EKG. He had an echocardiogram in 08/31/03 showing mitral valve prolapse of the anterior leaflet with mild mitral regurgitation and with normal LV systolic and normal diastolic function. He had a normal stress Cardiolite on 11/21/1998. He has had a previously elevated cholesterol but is intolerant of Lipitor and he is controlling his cholesterol with diet and exercise. Since last visit he has had no new cardiac symptoms.  He brought in recent labs dated 08/08/14.  He has hypercholesterolemia with LDL 127.  His HDL is also nicely high at 102.  Patient is normal.  He is interested in going on a low-dose statin.  Lipitor previously caused myalgias. His blood pressure has been remaining borderline high.  He is no longer on steroids for his back pain. His back surgeon does not want him to exercise for 6 months postop.  He has not exercised now for the past 4 months. Past Medical History  Diagnosis Date  . MVP (mitral valve prolapse)     mild  . Hyperlipidemia     Past Surgical History  Procedure Laterality Date  . Tonsilectomy, adenoidectomy, bilateral myringotomy and tubes      as child  . Knee surgery      arthroscopic  left knee     Current Outpatient Prescriptions  Medication Sig Dispense Refill  . ibuprofen (ADVIL,MOTRIN) 200 MG tablet Take 400 mg by mouth as needed (for pain).     . ondansetron (ZOFRAN) 8 MG tablet Take 1 tablet (8 mg total) by mouth every 8 (eight) hours as needed for nausea or vomiting. 30 tablet 0  . zolpidem (AMBIEN) 10 MG tablet Take 10 mg  by mouth at bedtime as needed for sleep (for sleep).    . losartan (COZAAR) 25 MG tablet Take 1 tablet (25 mg total) by mouth daily. 30 tablet 5  . rosuvastatin (CRESTOR) 5 MG tablet Take 1 tablet (5 mg total) by mouth daily. 30 tablet 5   No current facility-administered medications for this visit.    Allergies:   Nuts    Social History:  The patient  reports that he has never smoked. He has never used smokeless tobacco. He reports that he drinks about 3.0 oz of alcohol per week. He reports that he does not use illicit drugs.   Family History:  The patient's family history includes Alzheimer's disease in his mother; Hypertension in his father.    ROS:  Please see the history of present illness.   Otherwise, review of systems are positive for none.   All other systems are reviewed and negative.    PHYSICAL EXAM: VS:  BP 142/78 mmHg  Pulse 69  Ht 5\' 10"  (1.778 m)  Wt 172 lb 1.9 oz (78.073 kg)  BMI 24.70 kg/m2 , BMI Body mass index is 24.7 kg/(m^2). GEN: Well nourished, well developed, in no acute distress HEENT: normal Neck: no JVD, carotid bruits, or masses Cardiac: RRR; there is a grade 2/6 late systolic murmur of mitral valve prolapse at apex Respiratory:  clear  to auscultation bilaterally, normal work of breathing GI: soft, nontender, nondistended, + BS MS: no deformity or atrophy Skin: warm and dry, no rash Neuro:  Strength and sensation are intact Psych: euthymic mood, full affect   EKG:  EKG is ordered today. The ekg ordered today demonstrates normal sinus rhythm.  Right bundle branch block.   Recent Labs: No results found for requested labs within last 365 days.    Lipid Panel No results found for: CHOL, TRIG, HDL, CHOLHDL, VLDL, LDLCALC, LDLDIRECT    Wt Readings from Last 3 Encounters:  08/09/14 172 lb 1.9 oz (78.073 kg)  04/20/14 170 lb (77.111 kg)  09/07/13 172 lb (78.019 kg)        ASSESSMENT AND PLAN:  ASSESSMENT AND PLAN:  1. Essential  hypertension.  Systolic blood pressures are staying above 140 2. Mitral valve prolapse 3. Right bundle branch block, chronic 4. Insomnia 5. Hyperlipidemia   Current medicines are reviewed at length with the patient today. The patient does not have concerns regarding medicines.  The following changes have been made: Begin losartan 25 mg daily and Crestor 5 mg daily  Labs/ tests ordered today include:  Orders Placed This Encounter  Procedures  . EKG 12-Lead           Disposition: Recheck in 3 months for follow-up office visit.  He will get fasting lipids and electrolytes ahead of time and hepatic function tests.         Orders Placed This Encounter  Procedures  . EKG 12-Lead       Signed, Darlin Coco MD 08/09/2014 5:19 PM    [facility] [place], [place], [place]  [address] Phone: [phone]; Fax: [phone]

## 2014-08-09 NOTE — Patient Instructions (Signed)
Medication Instructions:  START LOSARTAN 25 MG ONE DAILY  START CRESTOR 5 MG ONE DAILY  Labwork: NONE  Testing/Procedures: NOND  Follow-Up: Your physician recommends that you schedule a follow-up appointment in: 3 MONTH OV

## 2014-10-03 ENCOUNTER — Other Ambulatory Visit: Payer: Self-pay | Admitting: Cardiology

## 2014-10-03 DIAGNOSIS — G4700 Insomnia, unspecified: Secondary | ICD-10-CM

## 2014-11-14 ENCOUNTER — Encounter: Payer: Self-pay | Admitting: *Deleted

## 2014-11-15 ENCOUNTER — Encounter: Payer: Self-pay | Admitting: Cardiology

## 2014-11-15 ENCOUNTER — Ambulatory Visit (INDEPENDENT_AMBULATORY_CARE_PROVIDER_SITE_OTHER): Payer: PRIVATE HEALTH INSURANCE | Admitting: Cardiology

## 2014-11-15 VITALS — BP 118/68 | HR 82 | Ht 69.5 in | Wt 173.6 lb

## 2014-11-15 DIAGNOSIS — E785 Hyperlipidemia, unspecified: Secondary | ICD-10-CM

## 2014-11-15 DIAGNOSIS — I341 Nonrheumatic mitral (valve) prolapse: Secondary | ICD-10-CM | POA: Diagnosis not present

## 2014-11-15 DIAGNOSIS — I4510 Unspecified right bundle-branch block: Secondary | ICD-10-CM | POA: Diagnosis not present

## 2014-11-15 NOTE — Progress Notes (Signed)
Cardiology Office Note   Date:  11/15/2014   ID:  Alexander [name], DOB December 20, 1940, MRN [id number]  PCP:  Warren Danes, MD  Cardiologist: Darlin Coco MD  No chief complaint on file.     History of Present Illness: Alexander [name] is a 74 y.o. male who presents for follow-up office visit.  He has a past history of mitral valve prolapse which is asymptomatic. He has a history of right bundle branch block on his EKG. He had an echocardiogram in 08/31/03 showing mitral valve prolapse of the anterior leaflet with mild mitral regurgitation and with normal LV systolic and normal diastolic function. He had a normal stress Cardiolite on 11/21/1998. He has had a previously elevated cholesterol but is intolerant of Lipitor and he is controlling his cholesterol with diet and exercise and he is able to tolerate Crestor 5 mg daily. Since last visit he has had no new cardiac symptoms. He is not having any myalgias from the Crestor He brought in recent labs dated 08/08/14. He has hypercholesterolemia with LDL 127. His HDL is also nicely high at 102. Patient is normal. He is interested in going on a low-dose statin. Lipitor previously caused myalgias. His blood pressure has been remaining borderline high. He is no longer on steroids for his back pain. His back surgeon does not want him to exercise for 6 months postop. He has not exercised now for the past 4 months.  Past Medical History  Diagnosis Date  . MVP (mitral valve prolapse)     mild  . Hyperlipidemia     Past Surgical History  Procedure Laterality Date  . Tonsilectomy, adenoidectomy, bilateral myringotomy and tubes      as child  . Total knee arthroplasty Left      Current Outpatient Prescriptions  Medication Sig Dispense Refill  . ibuprofen (ADVIL,MOTRIN) 200 MG tablet Take 400 mg by mouth as needed (for pain).     Marland Kitchen losartan (COZAAR) 25 MG tablet Take 1 tablet (25 mg total) by mouth daily. 30 tablet 5  .  ondansetron (ZOFRAN) 8 MG tablet Take 1 tablet (8 mg total) by mouth every 8 (eight) hours as needed for nausea or vomiting. 30 tablet 0  . rosuvastatin (CRESTOR) 5 MG tablet Take 1 tablet (5 mg total) by mouth daily. 30 tablet 5  . zolpidem (AMBIEN) 10 MG tablet Take 10 mg by mouth at bedtime as needed for sleep (sleep).     No current facility-administered medications for this visit.    Allergies:   Nuts    Social History:  The patient  reports that he has never smoked. He has never used smokeless tobacco. He reports that he drinks about 3.0 oz of alcohol per week. He reports that he does not use illicit drugs.   Family History:  The patient's family history includes Alzheimer's disease in his mother; Hypertension in his father.    ROS:  Please see the history of present illness.   Otherwise, review of systems are positive for none.   All other systems are reviewed and negative.    PHYSICAL EXAM: VS:  BP 118/68 mmHg  Pulse 82  Ht 5' 9.5" (1.765 m)  Wt 173 lb 9.6 oz (78.744 kg)  BMI 25.28 kg/m2  SpO2 97% , BMI Body mass index is 25.28 kg/(m^2). GEN: Well nourished, well developed, in no acute distress HEENT: normal Neck: no JVD, carotid bruits, or masses Cardiac: RRR; there is a midsystolic click followed by a  grade 2/6 late systolic murmur of mitral regurgitation Respiratory:  clear to auscultation bilaterally, normal work of breathing GI: soft, nontender, nondistended, + BS MS: no deformity or atrophy Skin: warm and dry, no rash Neuro:  Strength and sensation are intact Psych: euthymic mood, full affect   EKG:  EKG is not ordered today.    Recent Labs: No results found for requested labs within last 365 days.  He brought in outside labs which are satisfactory.  His LDL is 72 and HDL 93 and total cholesterol 178.  Options studies are normal.  Renal function and potassium are normal on losartan which was begun at his last visit for high blood pressure.  Lipid Panel No  results found for: CHOL, TRIG, HDL, CHOLHDL, VLDL, LDLCALC, LDLDIRECT    Wt Readings from Last 3 Encounters:  11/15/14 173 lb 9.6 oz (78.744 kg)  08/09/14 172 lb 1.9 oz (78.073 kg)  04/20/14 170 lb (77.111 kg)      Other studies Reviewed: Outside labs from [facility] dated 11/14/2014   ASSESSMENT AND PLAN:  1. Essential hypertension. Good response to low-dose losartan.  Blood pressure now normal 2. Mitral valve prolapse, asymptomatic 3. Right bundle branch block, chronic 4. Insomnia 5. Hyperlipidemia, good response to Crestor   Current medicines are reviewed at length with the patient today. The patient does not have concerns regarding medicines.  The following changes have been made: Begin losartan 25 mg daily and Crestor 5 mg daily   Current medicines are reviewed at length with the patient today.  The patient does not have concerns regarding medicines.  The following changes have been made:  no change  Labs/ tests ordered today include:  No orders of the defined types were placed in this encounter.    Disposition: Continue current medication.  Recheck in one year for office visit and EKG.  Normally he brings in his labs from [employer] where he works part-time  Signed, Darlin Coco MD 11/15/2014 6:06 PM    [facility], [place], [place]  [address] Phone: [phone]; Fax: [phone]

## 2014-11-15 NOTE — Patient Instructions (Signed)
Medication Instructions:  Your physician recommends that you continue on your current medications as directed. Please refer to the Current Medication list given to you today.  Labwork: none  Testing/Procedures: none  Follow-Up: Your physician wants you to follow-up in: 1 year ov/ekg You will receive a reminder letter in the mail two months in advance. If you don't receive a letter, please call our office to schedule the follow-up appointment.     

## 2014-12-22 ENCOUNTER — Other Ambulatory Visit: Payer: Self-pay | Admitting: *Deleted

## 2014-12-22 DIAGNOSIS — G4700 Insomnia, unspecified: Secondary | ICD-10-CM

## 2014-12-23 MED ORDER — ZOLPIDEM TARTRATE 10 MG PO TABS: 10.0000 mg | ORAL_TABLET | Freq: Every evening | ORAL | Status: DC | PRN

## 2015-02-28 ENCOUNTER — Other Ambulatory Visit: Payer: Self-pay | Admitting: Cardiology

## 2015-05-02 ENCOUNTER — Other Ambulatory Visit: Payer: Self-pay | Admitting: Cardiology

## 2015-05-02 DIAGNOSIS — G4700 Insomnia, unspecified: Secondary | ICD-10-CM

## 2015-10-23 ENCOUNTER — Other Ambulatory Visit: Payer: Self-pay | Admitting: General Surgery

## 2016-04-30 ENCOUNTER — Other Ambulatory Visit: Payer: Self-pay | Admitting: Internal Medicine

## 2016-04-30 DIAGNOSIS — I341 Nonrheumatic mitral (valve) prolapse: Secondary | ICD-10-CM

## 2016-05-21 ENCOUNTER — Ambulatory Visit (HOSPITAL_COMMUNITY): Payer: PRIVATE HEALTH INSURANCE | Attending: Cardiovascular Disease

## 2016-05-21 ENCOUNTER — Other Ambulatory Visit: Payer: Self-pay

## 2016-05-21 DIAGNOSIS — I341 Nonrheumatic mitral (valve) prolapse: Secondary | ICD-10-CM | POA: Diagnosis not present

## 2016-05-21 DIAGNOSIS — I517 Cardiomegaly: Secondary | ICD-10-CM | POA: Diagnosis not present

## 2016-05-21 DIAGNOSIS — I340 Nonrheumatic mitral (valve) insufficiency: Secondary | ICD-10-CM | POA: Diagnosis present

## 2016-05-21 DIAGNOSIS — I4510 Unspecified right bundle-branch block: Secondary | ICD-10-CM | POA: Diagnosis not present

## 2016-05-21 DIAGNOSIS — E785 Hyperlipidemia, unspecified: Secondary | ICD-10-CM | POA: Diagnosis not present

## 2016-09-03 ENCOUNTER — Ambulatory Visit (INDEPENDENT_AMBULATORY_CARE_PROVIDER_SITE_OTHER): Payer: Commercial Managed Care - PPO | Admitting: Cardiology

## 2016-09-03 ENCOUNTER — Encounter: Payer: Self-pay | Admitting: Cardiology

## 2016-09-03 VITALS — BP 124/62 | HR 61 | Ht 69.5 in | Wt 172.0 lb

## 2016-09-03 DIAGNOSIS — E784 Other hyperlipidemia: Secondary | ICD-10-CM

## 2016-09-03 DIAGNOSIS — I10 Essential (primary) hypertension: Secondary | ICD-10-CM | POA: Diagnosis not present

## 2016-09-03 DIAGNOSIS — E7849 Other hyperlipidemia: Secondary | ICD-10-CM

## 2016-09-03 DIAGNOSIS — I4510 Unspecified right bundle-branch block: Secondary | ICD-10-CM

## 2016-09-03 DIAGNOSIS — I341 Nonrheumatic mitral (valve) prolapse: Secondary | ICD-10-CM | POA: Diagnosis not present

## 2016-09-03 NOTE — Patient Instructions (Signed)

## 2016-09-03 NOTE — Progress Notes (Signed)
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 The patient has been followed by Dr. Mare [name] for years. He continues to work 3 days a week. He does avid cardio exercise 3 times a week and denies any shortness of breath, chest pain. He has never experienced palpitations dizziness or syncope. He had an echocardiogram performed in March of this year that showed normal LVEF 03-21% grade 1 diastolic dysfunction, mitral valve prolapse with only trivial mitral regurgitation, moderately dilated left atrium, normal sites and function of the right ventricle and normal right-sided pressures. Patient denies lower extremity edema orthopnea or paroxysmal nocturnal dyspnea. He has been tolerating Crestor 5 mg daily well.  Past Medical History:  Diagnosis Date  . Hyperlipidemia   . MVP (mitral valve prolapse)    mild    Past Surgical History:  Procedure Laterality Date  . TONSILECTOMY, ADENOIDECTOMY, BILATERAL MYRINGOTOMY AND TUBES     as child  . TOTAL KNEE ARTHROPLASTY Left     Current Medications: Outpatient Medications Prior to Visit  Medication Sig Dispense Refill  . ibuprofen (ADVIL,MOTRIN) 200 MG tablet Take 400 mg by mouth as needed (for pain).     Marland Kitchen losartan (COZAAR) 25 MG tablet TAKE ONE TABLET BY MOUTH ONCE DAILY 30 tablet 10  . ondansetron (ZOFRAN) 8 MG tablet Take 1 tablet (8 mg total) by mouth every 8 (eight) hours as needed for nausea or vomiting. 30 tablet 0  . rosuvastatin (CRESTOR) 5 MG tablet TAKE ONE TABLET EACH DAY 30 tablet 10  . zolpidem (AMBIEN) 10 MG tablet TAKE ONE TABLET AT BEDTIME AS NEEDED 90 tablet 0    No facility-administered medications prior to visit.     Allergies:   Nuts   Social History   Social History  . Marital status: Married    Spouse name: N/A  . Number of children: N/A  . Years of education: N/A   Social History Main Topics  . Smoking status: Never Smoker  . Smokeless tobacco: Never Used  . Alcohol use 3.0 oz/week    5 Glasses of wine per week  . Drug use: No  . Sexual activity: Not Asked   Other Topics Concern  . None   Social History Narrative  . None    Family History:  The patient's family history includes Alzheimer's disease in his mother; Hypertension in his father.   ROS:   Please see the history of present illness.    ROS All other systems reviewed and are negative.  PHYSICAL EXAM:   VS:  BP 124/62   Pulse 61   Ht 5' 9.5" (1.765 m)   Wt 172 lb (78 kg)   BMI 25.04 kg/m    GEN: Well nourished, well developed, in no acute distress  HEENT: normal  Neck: no JVD, carotid bruits, or masses Cardiac: RRR; 2 out of 6 mid to end murmurs, rubs, or gallops,no edema  Respiratory:  clear to auscultation bilaterally, normal work of breathing GI: soft, nontender, nondistended, + BS MS: no deformity or atrophy  Skin: warm and dry, no rash Neuro:  Alert and Oriented x 3,  Strength and sensation are intact Psych: euthymic mood, full affect  Wt Readings from Last 3 Encounters:  09/03/16 172 lb (78 kg)  11/15/14 173 lb 9.6 oz (78.7 kg)  08/09/14 172 lb 1.9 oz (78.1 kg)    Studies/Labs Reviewed:   EKG:  EKG is ordered today.  The ekg ordered today demonstrates Sinus rhythm with right bundle branch block. This is unchanged from prior EKG in 2016. This was personally reviewed.  Recent Labs: No results found for requested labs within last 8760 hours.   Lipid Panel No results found for: CHOL, TRIG, HDL, CHOLHDL, VLDL, LDLCALC, LDLDIRECT  Additional studies/ records that were reviewed today include:   TTE: 05/2016  - Left ventricle: The cavity size  was mildly dilated. Systolic   function was normal. The estimated ejection fraction was in the   range of 60% to 65%. Wall motion was normal; there were no   regional wall motion abnormalities. Doppler parameters are   consistent with abnormal left ventricular relaxation (grade 1   diastolic dysfunction). Doppler parameters are consistent with   indeterminate ventricular filling pressure. - Aortic valve: Transvalvular velocity was within the normal range.   There was no stenosis. There was no regurgitation. 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 Mobility was not   restricted. Prolapse, involving the anterior leaflet and the   posterior leaflet. - Left atrium: The atrium was moderately dilated. - Right ventricle: The cavity size was normal. Wall thickness was   normal. Systolic function was normal. - Atrial septum: No defect or patent foramen ovale was identified   by color flow Doppler.   ASSESSMENT:    1. MVP (mitral valve prolapse)   2. Other hyperlipidemia   3. Right bundle branch block   4. Essential hypertension     PLAN:  In order of problems listed above:  1. MVP, 2 out of 6 and systolic murmur, there is only trivial mitral regurgitation, he is completely asymptomatic and has no palpitations no dyspnea on exertion, we will plan to repeat echocardiogram in 2 years unless his symptoms change. 2. Right bundle branch block, chronic unchanged from prior. 3. Hypertension - well controlled 4. Hyperlipidemia, on Crestor 5 mg daily, followed by Dr. Henrene Pastor knee, all lipids at goal in 2016 and per patient also this year.    Medication Adjustments/Labs and Tests Ordered: Current medicines are reviewed at length with the patient today.  Concerns regarding medicines are outlined above.  Medication changes, Labs and Tests ordered today are listed in the Patient Instructions below. Patient Instructions   Medication Instructions:   Your physician recommends that you continue on your current medications as directed. Please refer to the Current Medication list given to you today.    Follow-Up:  Your physician wants you to follow-up in: [place] will receive a reminder letter in the mail two months in advance. If you don't receive a letter, please call our office to schedule the follow-up appointment.        If you need a refill on your cardiac medications before your next appointment, please call your pharmacy.       Signed, Alexander Dawley, MD  09/03/2016 4:10 PM    [facility] [address], [place], [place]  [address] Phone: [phone]; Fax: [phone]

## 2017-03-24 ENCOUNTER — Telehealth: Payer: Self-pay | Admitting: Cardiology

## 2017-03-24 DIAGNOSIS — I341 Nonrheumatic mitral (valve) prolapse: Secondary | ICD-10-CM

## 2017-03-24 DIAGNOSIS — E7849 Other hyperlipidemia: Secondary | ICD-10-CM

## 2017-03-24 DIAGNOSIS — I4510 Unspecified right bundle-branch block: Secondary | ICD-10-CM

## 2017-03-24 NOTE — Telephone Encounter (Signed)
New message    Patient calling states he spoke with Dr [name] about having a test done for Calcuim scoring? There is no order for testing?

## 2017-03-24 NOTE — Telephone Encounter (Signed)
Spoke with the pt and he would like to proceed with getting a calcium score done.  Informed the pt that I will place the order in the system and have a [facility] scheduler call him back to arrange this appt.  Pt states he would like for this to be scheduled for sometime after Apr 20, 2017. Informed the pt that I will endorse this to the schedulers. Also endorsed to the pt that its $150 out of pocket cost.  Pt verbalized understanding and agrees with this plan.

## 2017-03-24 NOTE — Telephone Encounter (Signed)
-----   Message from [name], MD sent at 03/23/2017  1:07 PM EST ----- Regarding: RE: calcium score That would be great, Please order at any time, just tell him its 150 $ out of pocket  ----- Message ----- From: Nuala Alpha, LPN Sent: 7/71/1657  12:59 PM To: [name], MD Subject: calcium score                                  This pt wants to have a calcium score done, just as his wife did with [place] recently.  He's due to see you in June for his yearly, and wanted to know should he get this done now or wait until then? Please advise!  Thanks,   [facility]

## 2017-03-25 NOTE — Telephone Encounter (Signed)
Pts calcium score is scheduled for 04/22/17 at 0830. Pt made aware of appt date and time by CT scheduling.

## 2017-04-22 ENCOUNTER — Ambulatory Visit (INDEPENDENT_AMBULATORY_CARE_PROVIDER_SITE_OTHER)
Admission: RE | Admit: 2017-04-22 | Discharge: 2017-04-22 | Disposition: A | Discharge status: Home / Self Care | Payer: Self-pay | Source: Ambulatory Visit | Attending: Cardiology | Admitting: Cardiology

## 2017-04-22 DIAGNOSIS — E7849 Other hyperlipidemia: Secondary | ICD-10-CM

## 2017-04-22 DIAGNOSIS — I341 Nonrheumatic mitral (valve) prolapse: Secondary | ICD-10-CM

## 2017-04-22 DIAGNOSIS — I4510 Unspecified right bundle-branch block: Secondary | ICD-10-CM

## 2017-05-05 ENCOUNTER — Telehealth: Payer: Self-pay | Admitting: Cardiology

## 2017-05-05 DIAGNOSIS — R911 Solitary pulmonary nodule: Secondary | ICD-10-CM

## 2017-05-05 NOTE — Telephone Encounter (Signed)
New message  Pt verbalized that he is calling for the RN or Dr.Nelson  Pt said that he had a CT done and that his left lung showed some nodules and   He wan tot know if previous cardiologist Dr.Brackbill had it noted.

## 2017-05-05 NOTE — Telephone Encounter (Signed)
Will route this concern to Dr [name] for further review of previous CT.  Will follow-up with the pt accordingly thereafter.

## 2017-05-06 DIAGNOSIS — R911 Solitary pulmonary nodule: Secondary | ICD-10-CM | POA: Insufficient documentation

## 2017-05-06 NOTE — Telephone Encounter (Signed)
Spoke with the pt and informed him of Dr Francesca Oman recommendations.  Informed the pt that we could do a chest CT w/o contrast for a year out for follow-up, if he would be interested in this.  Pt states he would like to have a follow-up chest CT W/O done in 1 year, for follow-up.  Informed the pt that I will place the order in the system and send our [facility] schedulers a message to call the pt back and tentatively schedule this for 1 year out.  Pt verbalized understanding and agrees with this plan.

## 2017-05-06 NOTE — Telephone Encounter (Signed)
CT scan report states  Left lower lobe 5 mm pulmonary nodule. No follow-up needed if patient is low-risk. Non-contrast chest CT can be considered in 12 months if patient is high-risk. Based on the fact that he has never smoked, he is considered a low risk and no follow up is needed, if he wants to repeat it in 12 months just to be sure, that would be ok as well.

## 2017-05-22 NOTE — Telephone Encounter (Signed)
RE: chest CT without contrast to be done in 1 year out  Received: Collins, NT  Signe Colt, Charlcie Cradle, LPN        Karlene Einstein   It is up to the patient if he wants to schedule now or next year. I have called this pt 3 times to schedule his CT however he still has not called me back.   Thank you   Erline Levine

## 2017-06-17 DIAGNOSIS — I700 Atherosclerosis of aorta: Secondary | ICD-10-CM | POA: Diagnosis not present

## 2017-06-17 DIAGNOSIS — Z125 Encounter for screening for malignant neoplasm of prostate: Secondary | ICD-10-CM | POA: Diagnosis not present

## 2017-06-17 DIAGNOSIS — E7849 Other hyperlipidemia: Secondary | ICD-10-CM | POA: Diagnosis not present

## 2017-06-17 DIAGNOSIS — Z6825 Body mass index (BMI) 25.0-25.9, adult: Secondary | ICD-10-CM | POA: Diagnosis not present

## 2017-06-17 DIAGNOSIS — M1990 Unspecified osteoarthritis, unspecified site: Secondary | ICD-10-CM | POA: Diagnosis not present

## 2017-06-17 DIAGNOSIS — R82998 Other abnormal findings in urine: Secondary | ICD-10-CM | POA: Diagnosis not present

## 2017-06-17 DIAGNOSIS — H6123 Impacted cerumen, bilateral: Secondary | ICD-10-CM | POA: Diagnosis not present

## 2017-06-17 DIAGNOSIS — I77810 Thoracic aortic ectasia: Secondary | ICD-10-CM | POA: Diagnosis not present

## 2017-06-17 DIAGNOSIS — I10 Essential (primary) hypertension: Secondary | ICD-10-CM | POA: Diagnosis not present

## 2017-06-17 DIAGNOSIS — I341 Nonrheumatic mitral (valve) prolapse: Secondary | ICD-10-CM | POA: Diagnosis not present

## 2017-06-17 DIAGNOSIS — Z0000 Encounter for general adult medical examination without abnormal findings: Secondary | ICD-10-CM | POA: Diagnosis not present

## 2017-06-17 DIAGNOSIS — R918 Other nonspecific abnormal finding of lung field: Secondary | ICD-10-CM | POA: Diagnosis not present

## 2017-06-22 DIAGNOSIS — Z1212 Encounter for screening for malignant neoplasm of rectum: Secondary | ICD-10-CM | POA: Diagnosis not present

## 2017-08-05 ENCOUNTER — Telehealth: Payer: Self-pay | Admitting: Cardiology

## 2017-08-05 NOTE — Telephone Encounter (Signed)
Dr Alphonzo Cruise received a letter to come in to see Dr [name] but no appt was available. Pt wanted me to sent message to Dr [name] to see what does he need to do b/c he only wants to see her.

## 2017-08-05 NOTE — Telephone Encounter (Signed)
Pt scheduled to see Dr [name] for next Monday 08/10/17 at 0920.  Pt aware to arrive 15 mins prior to this appt.  Pt verbalized understanding and agrees with appt date and time.

## 2017-08-10 ENCOUNTER — Ambulatory Visit (INDEPENDENT_AMBULATORY_CARE_PROVIDER_SITE_OTHER): Payer: Medicare Other | Admitting: Cardiology

## 2017-08-10 ENCOUNTER — Encounter: Payer: Self-pay | Admitting: Cardiology

## 2017-08-10 VITALS — BP 120/80 | HR 57 | Ht 69.0 in | Wt 167.8 lb

## 2017-08-10 DIAGNOSIS — I341 Nonrheumatic mitral (valve) prolapse: Secondary | ICD-10-CM | POA: Diagnosis not present

## 2017-08-10 DIAGNOSIS — I4510 Unspecified right bundle-branch block: Secondary | ICD-10-CM | POA: Diagnosis not present

## 2017-08-10 DIAGNOSIS — I2584 Coronary atherosclerosis due to calcified coronary lesion: Secondary | ICD-10-CM

## 2017-08-10 DIAGNOSIS — I2510 Atherosclerotic heart disease of native coronary artery without angina pectoris: Secondary | ICD-10-CM

## 2017-08-10 NOTE — Progress Notes (Signed)
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 The patient has been followed by Dr. Mare Ferrari for years. He continues to work 3 days a week. He does avid cardio exercise 3 times a week and denies any shortness of breath, chest pain. He has never experienced palpitations dizziness or syncope. He had an echocardiogram performed in March of this year that showed normal LVEF 46-27% grade 1 diastolic dysfunction, mitral valve prolapse with only trivial mitral regurgitation, moderately dilated left atrium, normal sites and function of the right ventricle and normal right-sided pressures. Patient denies lower extremity edema orthopnea or paroxysmal nocturnal dyspnea. He has been tolerating Crestor 5 mg daily well.  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  No lower extremity edema orthopnea proximal nocturnal dyspnea no claudications.  He is tolerating Crestor well.  Past Medical History:  Diagnosis Date  . Hyperlipidemia   . MVP (mitral valve prolapse)    mild   Past Surgical History:  Procedure Laterality Date  . TONSILECTOMY, ADENOIDECTOMY, BILATERAL MYRINGOTOMY AND TUBES     as child  . TOTAL KNEE ARTHROPLASTY Left    Current Medications: Outpatient Medications Prior to Visit  Medication Sig Dispense Refill  . ibuprofen (ADVIL,MOTRIN) 200 MG tablet Take 400 mg by mouth as needed (for pain).     Marland Kitchen losartan (COZAAR) 25 MG tablet TAKE ONE TABLET BY MOUTH ONCE DAILY 30 tablet 10  .  ondansetron (ZOFRAN) 8 MG tablet Take 1 tablet (8 mg total) by mouth every 8 (eight) hours as needed for nausea or vomiting. 30 tablet 0  . rosuvastatin (CRESTOR) 5 MG tablet TAKE ONE TABLET EACH DAY 30 tablet 10  . zolpidem (AMBIEN) 10 MG tablet TAKE ONE TABLET AT BEDTIME AS NEEDED 90 tablet 0   No facility-administered medications prior to visit.     Allergies:   Nuts   Social History   Socioeconomic History  . Marital status: Married    Spouse name: Not on file  . Number of children: Not on file  . Years of education: Not on file  . Highest education level: Not on file  Occupational History  . Not on file  Social Needs  . Financial resource strain: Not on file  . Food insecurity:    Worry: Not on file    Inability: Not on file  . Transportation needs:    Medical: Not on file    Non-medical: Not on file  Tobacco Use  . Smoking status: Never Smoker  . Smokeless tobacco: Never Used  Substance and Sexual Activity  . Alcohol use: Yes    Alcohol/week: 3.0 oz    Types: 5 Glasses of wine per week  . Drug use: No  . Sexual activity: Not on file  Lifestyle  . Physical activity:    Days per week: Not on file    Minutes per session: Not on file  . Stress: Not on file  Relationships  . Social connections:  Talks on phone: Not on file    Gets together: Not on file    Attends religious service: Not on file    Active member of club or organization: Not on file    Attends meetings of clubs or organizations: Not on file    Relationship status: Not on file  Other Topics Concern  . Not on file  Social History Narrative  . Not on file    Family History:  The patient's family history includes Alzheimer's disease in his mother; Hypertension in his father.   ROS:   Please see the history of present illness.    ROS All other systems reviewed and are negative.  PHYSICAL EXAM:   VS:  BP 120/80   Pulse (!) 57   Ht 5\' 9"  (1.753 m)   Wt 167 lb 12.8 oz (76.1 kg)   SpO2 97%    BMI 24.78 kg/m    GEN: Well nourished, well developed, in no acute distress  HEENT: normal  Neck: no JVD, carotid bruits, or masses Cardiac: RRR; 2 out of 6 mid to end murmurs, rubs, or gallops,no edema  Respiratory:  clear to auscultation bilaterally, normal work of breathing GI: soft, nontender, nondistended, + BS MS: no deformity or atrophy  Skin: warm and dry, no rash Neuro:  Alert and Oriented x 3, Strength and sensation are intact Psych: euthymic mood, full affect  Wt Readings from Last 3 Encounters:  08/10/17 167 lb 12.8 oz (76.1 kg)  09/03/16 172 lb (78 kg)  11/15/14 173 lb 9.6 oz (78.7 kg)    Studies/Labs Reviewed:   EKG:  EKG is ordered today.  It shows sinus bradycardia, right bundle branch block, unchanged from prior.  This was personally reviewed.  Recent Labs: No results found for requested labs within last 8760 hours.   Lipid Panel No results found for: CHOL, TRIG, HDL, CHOLHDL, VLDL, LDLCALC, LDLDIRECT  Additional studies/ records that were reviewed today include:   TTE: 05/2016  - Left ventricle: The cavity size was mildly dilated. Systolic   function was normal. The estimated ejection fraction was in the   range of 60% to 65%. Wall motion was normal; there were no   regional wall motion abnormalities. Doppler parameters are   consistent with abnormal left ventricular relaxation (grade 1   diastolic dysfunction). Doppler parameters are consistent with   indeterminate ventricular filling pressure. - Aortic valve: Transvalvular velocity was within the normal range.   There was no stenosis. There was no regurgitation. 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 Mobility was not   restricted. Prolapse, involving the anterior leaflet and the   posterior leaflet. - Left atrium: The atrium was moderately dilated. - Right ventricle: The cavity size was normal. Wall thickness was   normal.  Systolic function was normal. - Atrial septum: No defect or patent foramen ovale was identified   by color flow Doppler.   ASSESSMENT:    1. MVP (mitral valve prolapse)   2. Right bundle branch block   3. Coronary artery calcification     PLAN:  In order of problems listed above:  1. MVP, 2 out of 6 and systolic murmur, there is only trivial mitral regurgitation, he is completely asymptomatic and has no palpitations no dyspnea on exertion, we will plan to repeat echocardiogram in 2 years unless his symptoms change.  I would repeat echocardiogram prior to the next annual exam in 2020. 2.  Right bundle branch block, chronic unchanged from prior. 3. Hypertension - well controlled 4. Hyperlipidemia, on Crestor 5 mg daily, followed by Dr. Henrene Pastor knee, all lipids at goal in 2016 and per patient also this year. 5. Coronary calcium score of 94. This was 75 percentile for age and sex matched control.  He is already on Crestor, I would continue the same management given low percentile for age and sex high level of exercise and good diet. 6.  Ascending aortic diameter of 39 mm, he is explained that this is still within normal limits, we will recheck on his echocardiogram next year.    Medication Adjustments/Labs and Tests Ordered: Current medicines are reviewed at length with the patient today.  Concerns regarding medicines are outlined above.  Medication changes, Labs and Tests ordered today are listed in the Patient Instructions below. Patient Instructions  Medication Instructions:   Your physician recommends that you continue on your current medications as directed. Please refer to the Current Medication list given to you today.     Testing/Procedures:  Your physician has requested that you have an echocardiogram. Echocardiography is a painless test that uses sound waves to create images of your heart. It provides your doctor with information about the size and shape of your heart and how  well your heart's chambers and valves are working. This procedure takes approximately one hour. There are no restrictions for this procedure. PLEASE SCHEDULE THIS PRIOR TO YOUR ONE YEAR FOLLOW-UP APPOINTMENT WITH DR [name]     Follow-Up:  Your physician wants you to follow-up in: [place] will receive a reminder letter in the mail two months in advance. If you don't receive a letter, please call our office to schedule the follow-up appointment. PLEASE HAVE YOUR ECHO SCHEDULED PRIOR TO YOUR ONE YEAR FOLLOW-UP APPOINTMENT WITH DR [name]        If you need a refill on your cardiac medications before your next appointment, please call your pharmacy.      Signed, [name], MD  08/10/2017 10:33 AM    [facility] [address], [place], [place]  [address] Phone: [phone]; Fax: [phone]

## 2017-08-10 NOTE — Patient Instructions (Signed)
Medication Instructions:   Your physician recommends that you continue on your current medications as directed. Please refer to the Current Medication list given to you today.     Testing/Procedures:  Your physician has requested that you have an echocardiogram. Echocardiography is a painless test that uses sound waves to create images of your heart. It provides your doctor with information about the size and shape of your heart and how well your heart's chambers and valves are working. This procedure takes approximately one hour. There are no restrictions for this procedure. PLEASE SCHEDULE THIS PRIOR TO YOUR ONE YEAR FOLLOW-UP APPOINTMENT WITH DR [name]     Follow-Up:  Your physician wants you to follow-up in: [place] will receive a reminder letter in the mail two months in advance. If you don't receive a letter, please call our office to schedule the follow-up appointment. PLEASE HAVE YOUR ECHO SCHEDULED PRIOR TO YOUR ONE YEAR FOLLOW-UP APPOINTMENT WITH DR [name]        If you need a refill on your cardiac medications before your next appointment, please call your pharmacy.

## 2017-08-31 DIAGNOSIS — L821 Other seborrheic keratosis: Secondary | ICD-10-CM | POA: Diagnosis not present

## 2017-08-31 DIAGNOSIS — D692 Other nonthrombocytopenic purpura: Secondary | ICD-10-CM | POA: Diagnosis not present

## 2017-08-31 DIAGNOSIS — D23112 Other benign neoplasm of skin of right lower eyelid, including canthus: Secondary | ICD-10-CM | POA: Diagnosis not present

## 2017-10-10 ENCOUNTER — Emergency Department (HOSPITAL_COMMUNITY)
Admission: EM | Admit: 2017-10-10 | Discharge: 2017-10-10 | Disposition: A | Discharge status: Home / Self Care | Payer: Medicare Other | Source: Home / Self Care | Attending: Emergency Medicine | Admitting: Emergency Medicine

## 2017-10-10 ENCOUNTER — Other Ambulatory Visit: Payer: Self-pay

## 2017-10-10 ENCOUNTER — Encounter (HOSPITAL_COMMUNITY): Payer: Self-pay | Admitting: *Deleted

## 2017-10-10 DIAGNOSIS — I4510 Unspecified right bundle-branch block: Secondary | ICD-10-CM | POA: Diagnosis not present

## 2017-10-10 DIAGNOSIS — S20211A Contusion of right front wall of thorax, initial encounter: Secondary | ICD-10-CM | POA: Diagnosis not present

## 2017-10-10 DIAGNOSIS — I10 Essential (primary) hypertension: Secondary | ICD-10-CM

## 2017-10-10 DIAGNOSIS — S0101XA Laceration without foreign body of scalp, initial encounter: Secondary | ICD-10-CM | POA: Diagnosis not present

## 2017-10-10 DIAGNOSIS — Z96652 Presence of left artificial knee joint: Secondary | ICD-10-CM | POA: Insufficient documentation

## 2017-10-10 DIAGNOSIS — E785 Hyperlipidemia, unspecified: Secondary | ICD-10-CM | POA: Diagnosis not present

## 2017-10-10 DIAGNOSIS — Z66 Do not resuscitate: Secondary | ICD-10-CM | POA: Diagnosis not present

## 2017-10-10 DIAGNOSIS — R55 Syncope and collapse: Secondary | ICD-10-CM | POA: Insufficient documentation

## 2017-10-10 DIAGNOSIS — I341 Nonrheumatic mitral (valve) prolapse: Secondary | ICD-10-CM | POA: Diagnosis not present

## 2017-10-10 HISTORY — DX: Essential (primary) hypertension: I10

## 2017-10-10 LAB — TROPONIN I: Troponin I: <0.03 ng/mL (ref <0.03)

## 2017-10-10 LAB — CBC
HCT: 39.2 % (ref 39.0–52.0)
Hemoglobin: 13.4 g/dL (ref 13.0–17.0)
MCH: 31.7 pg (ref 26.0–34.0)
MCHC: 34.2 g/dL (ref 30.0–36.0)
MCV: 92.7 fL (ref 78.0–100.0)
Platelets: 210 10*3/uL (ref 150–400)
RBC: 4.23 MIL/uL (ref 4.22–5.81)
RDW: 13.4 % (ref 11.5–15.5)
WBC: 8.1 10*3/uL (ref 4.0–10.5)

## 2017-10-10 LAB — URINALYSIS, ROUTINE W REFLEX MICROSCOPIC
Bilirubin Urine: NEGATIVE
Glucose, UA: NEGATIVE mg/dL
Hgb urine dipstick: NEGATIVE
Ketones, ur: NEGATIVE mg/dL
Leukocytes, UA: NEGATIVE
Nitrite: NEGATIVE
Protein, ur: NEGATIVE mg/dL
Specific Gravity, Urine: 1.021 (ref 1.005–1.030)
pH: 5 (ref 5.0–8.0)

## 2017-10-10 LAB — BASIC METABOLIC PANEL
Anion gap: 8 (ref 5–15)
BUN: 19 mg/dL (ref 8–23)
CO2: 27 mmol/L (ref 22–32)
Calcium: 9 mg/dL (ref 8.9–10.3)
Chloride: 106 mmol/L (ref 98–111)
Creatinine, Ser: 0.84 mg/dL (ref 0.61–1.24)
GFR calc Af Amer: >60 mL/min (ref >60)
GFR calc non Af Amer: >60 mL/min (ref >60)
Glucose, Bld: 102 mg/dL — ABNORMAL HIGH (ref 70–99)
Potassium: 3.8 mmol/L (ref 3.5–5.1)
Sodium: 141 mmol/L (ref 135–145)

## 2017-10-10 LAB — CBG MONITORING, ED: Glucose-Capillary: 92 mg/dL (ref 70–99)

## 2017-10-10 NOTE — ED Triage Notes (Signed)
Pt arrives ambulatory to triage. Reports he was working outside this morning and felt like he was passing out so he lowered himself to the ground and laid down, the sensation subsided. Later in the afternoon he was in a standing position and had a witnessed syncopal episode lasting approx 4-5 minutes. He currently denies any pain at this time or any other symptoms. Alert, oriented and clear speech.

## 2017-10-10 NOTE — ED Provider Notes (Signed)
[facility] DEPT Provider Note   CSN: [id number] Arrival date & time: 10/10/17  1904     History   Chief Complaint Chief Complaint  Patient presents with  . Loss of Consciousness    HPI Alexander [name] is a 77 y.o. male.  77 yo M with a chief complaint of a syncopal event.  The patient had a near syncopal event this morning and then passed out this afternoon.  This morning the patient was working on something in a hot area and he felt lightheaded and then he laid down on until the symptoms resolved.  This afternoon he was at a wedding ceremony started feeling like he may pass out and then did.  Was unconscious for short time had some mild tremors.  No confusion afterwards except to the events.  Denies headache neck pain chest pain abdominal pain shortness of breath cough fevers vomiting or diarrhea.  Denies dark stool or blood in stool.  No issue like this before.  The history is provided by the patient.  Loss of Consciousness   This is a new problem. The current episode started less than 1 hour ago. The problem occurs rarely. The problem has been resolved. There was no loss of consciousness. Pertinent negatives include abdominal pain, chest pain, confusion, congestion, fever, headaches, palpitations and vomiting. He has tried nothing for the symptoms. The treatment provided no relief.    Past Medical History:  Diagnosis Date  . Hyperlipidemia   . Hypertension   . MVP (mitral valve prolapse)    mild    Patient Active Problem List   Diagnosis Date Noted  . Left lower lobe pulmonary nodule 05/06/2017  . Right bundle branch block 08/01/2010  . MVP (mitral valve prolapse)   . Hyperlipidemia     Past Surgical History:  Procedure Laterality Date  . TONSILECTOMY, ADENOIDECTOMY, BILATERAL MYRINGOTOMY AND TUBES     as child  . TOTAL KNEE ARTHROPLASTY Left         Home Medications    Prior to Admission medications   Medication Sig Start  Date End Date Taking? Authorizing Provider  ibuprofen (ADVIL,MOTRIN) 200 MG tablet Take 400 mg by mouth as needed (for pain).     [provider]  losartan (COZAAR) 25 MG tablet TAKE ONE TABLET BY MOUTH ONCE DAILY 02/28/15   Darlin Coco, MD  ondansetron (ZOFRAN) 8 MG tablet Take 1 tablet (8 mg total) by mouth every 8 (eight) hours as needed for nausea or vomiting. 04/18/14   Darlin Coco, MD  rosuvastatin (CRESTOR) 5 MG tablet TAKE ONE TABLET EACH DAY 02/28/15   Darlin Coco, MD  zolpidem (AMBIEN) 10 MG tablet TAKE ONE TABLET AT BEDTIME AS NEEDED 05/02/15   Darlin Coco, MD    Family History Family History  Problem Relation Age of Onset  . Alzheimer's disease Mother   . Hypertension Father     Social History Social History   Tobacco Use  . Smoking status: Never Smoker  . Smokeless tobacco: Never Used  Substance Use Topics  . Alcohol use: Yes    Alcohol/week: 3.0 oz    Types: 5 Glasses of wine per week  . Drug use: No     Allergies   Nuts   Review of Systems Review of Systems  Constitutional: Negative for chills and fever.  HENT: Negative for congestion and facial swelling.   Eyes: Negative for discharge and visual disturbance.  Respiratory: Negative for shortness of breath.   Cardiovascular:  Positive for syncope. Negative for chest pain and palpitations.  Gastrointestinal: Negative for abdominal pain, diarrhea and vomiting.  Musculoskeletal: Negative for arthralgias and myalgias.  Skin: Negative for color change and rash.  Neurological: Positive for syncope. Negative for tremors and headaches.  Psychiatric/Behavioral: Negative for confusion and dysphoric mood.     Physical Exam Updated Vital Signs BP (!) 149/58 (BP Location: Right Arm)   Pulse (!) 53   Temp (!) 97.5 F (36.4 C) (Oral)   Resp 16   SpO2 97%   Physical Exam  Constitutional: He is oriented to person, place, and time. He appears well-developed and well-nourished.  HENT:   Head: Normocephalic and atraumatic.  Eyes: Pupils are equal, round, and reactive to light. EOM are normal.  Neck: Normal range of motion. Neck supple. No JVD present.  Cardiovascular: Normal rate and regular rhythm. Exam reveals no gallop and no friction rub.  Murmur (4/6 murmur best over the mitral pole) heard. Pulmonary/Chest: No respiratory distress. He has no wheezes.  Abdominal: He exhibits no distension and no mass. There is no tenderness. There is no rebound and no guarding.  Musculoskeletal: Normal range of motion.  Neurological: He is alert and oriented to person, place, and time.  Skin: No rash noted. No pallor.  Psychiatric: He has a normal mood and affect. His behavior is normal.  Nursing note and vitals reviewed.    ED Treatments / Results  Labs (all labs ordered are listed, but only abnormal results are displayed) Labs Reviewed  BASIC METABOLIC PANEL - Abnormal; Notable for the following components:      Result Value   Glucose, Bld 102 (*)    All other components within normal limits  CBC  URINALYSIS, ROUTINE W REFLEX MICROSCOPIC  TROPONIN I  CBG MONITORING, ED    EKG EKG Interpretation  Date/Time:  Saturday October 10 2017 19:38:26 EDT Ventricular Rate:  58 PR Interval:    QRS Duration: 140 QT Interval:  437 QTC Calculation: 430 R Axis:   93 Text Interpretation:  Sinus rhythm RBBB and LPFB Baseline wander in lead(s) I II aVR aVF No old tracing to compare Confirmed by Deno Etienne [phone]) on 10/10/2017 8:13:18 PM   Radiology No results found.  Procedures Procedures (including critical care time)  Medications Ordered in ED Medications - No data to display   Initial Impression / Assessment and Plan / ED Course  I have reviewed the triage vital signs and the nursing notes.  Pertinent labs & imaging results that were available during my care of the patient were reviewed by me and considered in my medical decision making (see chart for details).     77  yo M with a chief complaint of a syncopal event.  Most likely this is vasovagal by history.  Labs here are reassuring glucose mildly elevated otherwise metabolic panel is benign.  UA negative for dehydration or infection.  CBC without anemia.  His EKG initially seemed concerning however it is unchanged over the course of at least the year when I look back at his old radiology visits.  Patient now feels back to his baseline.  We will have him take fluids by mouth as he is declined IV access.  Check a troponin.  Trop negative, patient continues to feel well.  D/c home.   11:32 PM:  I have discussed the diagnosis/risks/treatment options with the patient and family and believe the pt to be eligible for discharge home to follow-up with PCP, cards. We also discussed  returning to the ED immediately if new or worsening sx occur. We discussed the sx which are most concerning (e.g., sudden worsening pain, fever, inability to tolerate by mouth) that necessitate immediate return. Medications administered to the patient during their visit and any new prescriptions provided to the patient are listed below.  Medications given during this visit Medications - No data to display    The patient appears reasonably screen and/or stabilized for discharge and I doubt any other medical condition or other [facility] requiring further screening, evaluation, or treatment in the ED at this time prior to discharge.    Final Clinical Impressions(s) / ED Diagnoses   Final diagnoses:  Vasovagal syncope    ED Discharge Orders    None       Deno Etienne, DO 10/10/17 2332

## 2017-10-10 NOTE — ED Notes (Signed)
Lab added troponin on.

## 2017-10-10 NOTE — Discharge Instructions (Signed)
Eat and drink well for the next couple of days.  Return for worsening symptoms, chest pain, abdominal aipn.

## 2017-10-11 ENCOUNTER — Other Ambulatory Visit: Payer: Self-pay

## 2017-10-11 ENCOUNTER — Encounter (HOSPITAL_COMMUNITY): Payer: Self-pay | Admitting: Emergency Medicine

## 2017-10-11 ENCOUNTER — Observation Stay (HOSPITAL_COMMUNITY): Payer: Medicare Other

## 2017-10-11 ENCOUNTER — Inpatient Hospital Stay (HOSPITAL_COMMUNITY)
Admission: EM | Admit: 2017-10-11 | Discharge: 2017-10-13 | DRG: 244 | Disposition: A | Discharge status: Home / Self Care | Payer: Medicare Other | Attending: Cardiology | Admitting: Cardiology

## 2017-10-11 DIAGNOSIS — Z95818 Presence of other cardiac implants and grafts: Secondary | ICD-10-CM

## 2017-10-11 DIAGNOSIS — E785 Hyperlipidemia, unspecified: Secondary | ICD-10-CM | POA: Diagnosis present

## 2017-10-11 DIAGNOSIS — Z66 Do not resuscitate: Secondary | ICD-10-CM | POA: Diagnosis present

## 2017-10-11 DIAGNOSIS — I455 Other specified heart block: Secondary | ICD-10-CM

## 2017-10-11 DIAGNOSIS — S20219A Contusion of unspecified front wall of thorax, initial encounter: Secondary | ICD-10-CM

## 2017-10-11 DIAGNOSIS — S299XXA Unspecified injury of thorax, initial encounter: Secondary | ICD-10-CM | POA: Diagnosis not present

## 2017-10-11 DIAGNOSIS — I10 Essential (primary) hypertension: Secondary | ICD-10-CM | POA: Diagnosis not present

## 2017-10-11 DIAGNOSIS — S0990XA Unspecified injury of head, initial encounter: Secondary | ICD-10-CM | POA: Diagnosis not present

## 2017-10-11 DIAGNOSIS — I4510 Unspecified right bundle-branch block: Principal | ICD-10-CM | POA: Diagnosis present

## 2017-10-11 DIAGNOSIS — I341 Nonrheumatic mitral (valve) prolapse: Secondary | ICD-10-CM

## 2017-10-11 DIAGNOSIS — S20211A Contusion of right front wall of thorax, initial encounter: Secondary | ICD-10-CM | POA: Diagnosis not present

## 2017-10-11 DIAGNOSIS — Z96652 Presence of left artificial knee joint: Secondary | ICD-10-CM | POA: Diagnosis present

## 2017-10-11 DIAGNOSIS — R55 Syncope and collapse: Secondary | ICD-10-CM | POA: Diagnosis not present

## 2017-10-11 DIAGNOSIS — S0101XA Laceration without foreign body of scalp, initial encounter: Secondary | ICD-10-CM | POA: Diagnosis not present

## 2017-10-11 DIAGNOSIS — Z79899 Other long term (current) drug therapy: Secondary | ICD-10-CM

## 2017-10-11 HISTORY — DX: Syncope and collapse: R55

## 2017-10-11 MED ORDER — ENOXAPARIN SODIUM 40 MG/0.4ML ~~LOC~~ SOLN
40.0000 mg | SUBCUTANEOUS | Status: DC
  Filled 2017-10-11: qty 0.4

## 2017-10-11 MED ORDER — SODIUM CHLORIDE 0.9 % IV SOLN
INTRAVENOUS | Status: AC
  Administered 2017-10-11 – 2017-10-12 (×2): via INTRAVENOUS

## 2017-10-11 MED ORDER — ONDANSETRON HCL 4 MG PO TABS: 4.0000 mg | ORAL_TABLET | Freq: Four times a day (QID) | ORAL | Status: DC | PRN

## 2017-10-11 MED ORDER — SENNOSIDES-DOCUSATE SODIUM 8.6-50 MG PO TABS: 1.0000 | ORAL_TABLET | Freq: Every evening | ORAL | Status: DC | PRN

## 2017-10-11 MED ORDER — SODIUM CHLORIDE 0.9% FLUSH: 3.0000 mL | Freq: Two times a day (BID) | INTRAVENOUS | Status: DC

## 2017-10-11 MED ORDER — ZOLPIDEM TARTRATE 5 MG PO TABS
5.0000 mg | ORAL_TABLET | Freq: Every evening | ORAL | Status: DC | PRN
  Administered 2017-10-13: 5 mg via ORAL
  Filled 2017-10-11: qty 1

## 2017-10-11 MED ORDER — ONDANSETRON HCL 4 MG/2ML IJ SOLN: 4.0000 mg | Freq: Four times a day (QID) | INTRAMUSCULAR | Status: DC | PRN

## 2017-10-11 MED ORDER — LIDOCAINE HCL (PF) 1 % IJ SOLN
5.0000 mL | Freq: Once | INTRAMUSCULAR | Status: AC
  Administered 2017-10-11: 5 mL
  Filled 2017-10-11: qty 5

## 2017-10-11 NOTE — Progress Notes (Signed)
Pt requesting to walk the halls. MD paged. Stated that it would be better to wait until tomorrow after tests and/or cardiology visits the patient. Pt made aware.

## 2017-10-11 NOTE — H&P (Signed)
History and Physical    Alexander [name] KNL:[id number] DOB: 01-Jul-1940 DOA: 10/11/2017  PCP: [name], MD Patient coming from: home  Chief Complaint: recurrent syncope  HPI: Alexander [name] is a very pleasant 77 y.o. male with medical history significant MV prolapse, htn, chronic RBBB, hyperlipidemia presents to the emergency Department chief complaint of multiple syncope episodes. [facility] are asked to admit  Information is obtained from the patient and the chart.patient reports having 4 syncopal episodes in the last 24 hours. He states he went [place] yesterday evening same. Workup consisted of lap work, troponin, EKG all negative so he went home. He reports that one of the episodes was after he was moving the grill on his porch. He states it was hot he felt lightheaded he sat down and the symptoms soft. Later that day he was singing at a wedding he said he "felt like I was given a pass out" and then he did. Reportedly was unconscious for only a short period of time had some mild tremors. He denies any headache dizziness visual disturbances. No report of any confusion after these events. He denies chest pain palpitation shortness of breath diaphoresis nausea vomiting. There is no incontinence. One of the episodes occurred when he was a passenger in a car. This morning he awakened with his bathroom and according to him just woke up on the floor. He has no recollection of the time just before he passed out. He feels like he struck his chest is he's having some pain in the right anterior chest. In addition he has a laceration on top of his head. He states he's been eating and drinking normally no dysuria hematuria frequency or urgency. No diarrhea constipation melena or bright red blood per rectum. Been no change in his medications  Of not daughter reports she checked orthostatics at home and he was not orthostatic   ED Course: in the emergency department he's afebrile  hemodynamically stable and not hypoxic.  Review of Systems: As per HPI otherwise all other systems reviewed and are negative.   Ambulatory Status: he lives at home with his wife he is a retired Doctor, general practice. Independent with ADLs  Past Medical History:  Diagnosis Date  . Hyperlipidemia   . Hypertension   . MVP (mitral valve prolapse)    mild  . Syncope     Past Surgical History:  Procedure Laterality Date  . TONSILECTOMY, ADENOIDECTOMY, BILATERAL MYRINGOTOMY AND TUBES     as child  . TOTAL KNEE ARTHROPLASTY Left     Social History   Socioeconomic History  . Marital status: Married    Spouse name: Not on file  . Number of children: Not on file  . Years of education: Not on file  . Highest education level: Not on file  Occupational History  . Not on file  Social Needs  . Financial resource strain: Not on file  . Food insecurity:    Worry: Not on file    Inability: Not on file  . Transportation needs:    Medical: Not on file    Non-medical: Not on file  Tobacco Use  . Smoking status: Never Smoker  . Smokeless tobacco: Never Used  Substance and Sexual Activity  . Alcohol use: Yes    Alcohol/week: 3.0 oz    Types: 5 Glasses of wine per week  . Drug use: No  . Sexual activity: Not on file  Lifestyle  . Physical activity:    Days per  week: Not on file    Minutes per session: Not on file  . Stress: Not on file  Relationships  . Social connections:    Talks on phone: Not on file    Gets together: Not on file    Attends religious service: Not on file    Active member of club or organization: Not on file    Attends meetings of clubs or organizations: Not on file    Relationship status: Not on file  . Intimate partner violence:    Fear of current or ex partner: Not on file    Emotionally abused: Not on file    Physically abused: Not on file    Forced sexual activity: Not on file  Other Topics Concern  . Not on file  Social History Narrative  . Not on  file    Allergies  Allergen Reactions  . Nuts Hives, Itching and Swelling    Hazelnuts; tongue swelling    Family History  Problem Relation Age of Onset  . Alzheimer's disease Mother   . Hypertension Father     Prior to Admission medications   Medication Sig Start Date End Date Taking? Authorizing Provider  ibuprofen (ADVIL,MOTRIN) 200 MG tablet Take 400 mg by mouth as needed (for pain).     [provider]  losartan (COZAAR) 25 MG tablet TAKE ONE TABLET BY MOUTH ONCE DAILY 02/28/15   Darlin Coco, MD  ondansetron (ZOFRAN) 8 MG tablet Take 1 tablet (8 mg total) by mouth every 8 (eight) hours as needed for nausea or vomiting. 04/18/14   Darlin Coco, MD  rosuvastatin (CRESTOR) 5 MG tablet TAKE ONE TABLET EACH DAY 02/28/15   Darlin Coco, MD  zolpidem (AMBIEN) 10 MG tablet TAKE ONE TABLET AT BEDTIME AS NEEDED 05/02/15   Darlin Coco, MD    Physical Exam: Vitals:   10/11/17 1415 10/11/17 1420 10/11/17 1422 10/11/17 1430  BP:    134/69  Pulse: 65 (!) 54 (!) 57 (!) 53  Resp: 15 17 17 11   Temp:      TempSrc:      SpO2: 100% 99% 98% 99%     General:  Appears calm and comfortable sitting up in bed, 3cm laceration top of head with staples Eyes:  PERRL, EOMI, normal lids, iris ENT:  grossly normal hearing, lips & tongue, mucous membranes of his mouth are moist and pink Neck:  no LAD, masses or thyromegaly Cardiovascular:  Bradycardia but regular, +murmur. No LE edema.  Respiratory:  CTA bilaterally, no w/r/r. Normal respiratory effort. Abdomen:  soft, ntnd, positive bowel sounds throughout no guarding or rebounding Skin:  no rash or induration seen on limited exam, all abrasion right cheek just under the eye Musculoskeletal:  grossly normal tone BUE/BLE, good ROM, no bony abnormality Psychiatric:  grossly normal mood and affect, speech fluent and appropriate, AOx3 Neurologic:  CN 2-12 grossly intact, moves all extremities in coordinated fashion, sensation  intact, alert and oriented 3 speech clear facial symmetry bilateral grip 5 out of 5 lower extremity strength 5 out of 5 tongue midline  Labs on Admission: I have personally reviewed following labs and imaging studies  CBC: Recent Labs  Lab 10/10/17 1957  WBC 8.1  HGB 13.4  HCT 39.2  MCV 92.7  PLT 376   Basic Metabolic Panel: Recent Labs  Lab 10/10/17 1957  NA 141  K 3.8  CL 106  CO2 27  GLUCOSE 102*  BUN 19  CREATININE 0.84  CALCIUM 9.0  GFR: CrCl cannot be calculated (Unknown ideal weight.). Liver Function Tests: No results for input(s): AST, ALT, ALKPHOS, BILITOT, PROT, ALBUMIN in the last 168 hours. No results for input(s): LIPASE, AMYLASE in the last 168 hours. No results for input(s): AMMONIA in the last 168 hours. Coagulation Profile: No results for input(s): INR, PROTIME in the last 168 hours. Cardiac Enzymes: Recent Labs  Lab 10/10/17 1957  TROPONINI <0.03   BNP (last 3 results) No results for input(s): PROBNP in the last 8760 hours. HbA1C: No results for input(s): HGBA1C in the last 72 hours. CBG: Recent Labs  Lab 10/10/17 1945  GLUCAP 92   Lipid Profile: No results for input(s): CHOL, HDL, LDLCALC, TRIG, CHOLHDL, LDLDIRECT in the last 72 hours. Thyroid Function Tests: No results for input(s): TSH, T4TOTAL, FREET4, T3FREE, THYROIDAB in the last 72 hours. Anemia Panel: No results for input(s): VITAMINB12, FOLATE, FERRITIN, TIBC, IRON, RETICCTPCT in the last 72 hours. Urine analysis:    Component Value Date/Time   COLORURINE YELLOW 10/10/2017 2032   APPEARANCEUR CLEAR 10/10/2017 2032   LABSPEC 1.021 10/10/2017 2032   PHURINE 5.0 10/10/2017 2032   GLUCOSEU NEGATIVE 10/10/2017 2032   HGBUR NEGATIVE 10/10/2017 2032   BILIRUBINUR NEGATIVE 10/10/2017 2032   [place] NEGATIVE 10/10/2017 2032   PROTEINUR NEGATIVE 10/10/2017 2032   NITRITE NEGATIVE 10/10/2017 2032   LEUKOCYTESUR NEGATIVE 10/10/2017 2032    Creatinine Clearance: CrCl cannot  be calculated (Unknown ideal weight.).  Sepsis Labs: @LABRCNTIP (procalcitonin:4,lacticidven:4) )No results found for this or any previous visit (from the past 240 hour(s)).   Radiological Exams on Admission: No results found.  EKG: Independently reviewed. Sinus bradycardia Right bundle branch block Abnormal ECG No significant change since last tracing  Assessment/Plan Principal Problem:   Syncope Active Problems:   MVP (mitral valve prolapse)   Right bundle branch block   Hypertension   Hyperlipidemia   #1. Syncope. Multiple episodes over the last 24 hours. No prodome. No recent illness. Etiology unclear. He does have a history of mitral valve prolapse chronic right bundle branch block. EKG without acute changes. No s/sx infection, no metabolic derangement. Neuro exam unremarkable.  -admit to telemetry -obtain orthostatic vital sign -obtain 2decho -CT head -gentle IV fluid -may need cards follow up and/or 30 day holter monitor  #2. Hypertension. Controlled in the emergency department. Home meds include losartan -continue losartan  3. Hyperlipidemia. Home meds include crestor.  -continue crestor  #4. Chest pain after fall. Troponin neg yesterday -follow chest/rib xray -supportive therapy -mobilize  #5. mv prolapse. Echo 05/2016 with EF 71%, systolic function normal wall motion normal grade 1 diastolic dysfunction. -echo as noted above    DVT prophylaxis: lovenox  Code Status: dnr  Family Communication: daughter at bedside  Disposition Plan: home  Consults called: none  Admission status: obs   Dyanne Carrel M MD [facility]  If 7PM-7AM, please contact night-coverage [email or web address] Password [email or web address]  10/11/2017, 2:48 PM

## 2017-10-11 NOTE — Progress Notes (Signed)
Dr Josefa Half daughter, Caryl Pina MD, calls at shift change. Both the patient and daughter request that the pt be allowed to walk down the hall to induce syncopal episode so that it can occur while pt is being monitored.   Pt/family informed that we cannot 'induce' syncope without orders from a physician.   As this note is being entered, pt calls to oncoming nurse requesting to walk down the hall. Oncoming nurse discussing with charge nurse.

## 2017-10-11 NOTE — Progress Notes (Signed)
Report received from ED 

## 2017-10-11 NOTE — ED Notes (Signed)
Patient transported to X-ray 

## 2017-10-11 NOTE — ED Triage Notes (Signed)
Pt presents to ED for assessment after having 4 syncopal episodes in 24 hours.  Patient seen last night at [facility] for same, delta trops performed, CT, EKGs, all negative.  Pt with no hx except RBBB.  Patient hit his head today after last syncopal episode with 77mm lac to his anterior head noted, bleeding controlled.  Pt's daughter medical personnel, states orthostatics at home were normal, each episode lasts approx 2-4 minutes, can happen after standing or while sitting, no blood thinners.  Patient, when he fell today, also hit his right chest, with some sensitivity with palpation.  Patient/daughter requested no protocol orders until patient is seen by EDP.
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
## 2017-10-11 NOTE — Progress Notes (Signed)
[place]. No Diet Order, ? cardiology consult? no callback from NP earlier page.

## 2017-10-11 NOTE — Progress Notes (Signed)
Patient's daughter at bedside states that the orthostatics were negative at home this morning; some resistance in suggesting they be done again. Will ask tech to try, prior to initiating the NS infusion.

## 2017-10-11 NOTE — Progress Notes (Signed)
Page to K.Black NP regarding orders for patient.

## 2017-10-12 ENCOUNTER — Encounter (HOSPITAL_COMMUNITY)
Admission: EM | Disposition: A | Discharge status: Home / Self Care | Payer: Self-pay | Source: Home / Self Care | Attending: Cardiology

## 2017-10-12 ENCOUNTER — Observation Stay (HOSPITAL_BASED_OUTPATIENT_CLINIC_OR_DEPARTMENT_OTHER): Payer: Medicare Other

## 2017-10-12 DIAGNOSIS — J90 Pleural effusion, not elsewhere classified: Secondary | ICD-10-CM | POA: Diagnosis not present

## 2017-10-12 DIAGNOSIS — Z66 Do not resuscitate: Secondary | ICD-10-CM | POA: Diagnosis not present

## 2017-10-12 DIAGNOSIS — I10 Essential (primary) hypertension: Secondary | ICD-10-CM | POA: Diagnosis not present

## 2017-10-12 DIAGNOSIS — I361 Nonrheumatic tricuspid (valve) insufficiency: Secondary | ICD-10-CM | POA: Diagnosis not present

## 2017-10-12 DIAGNOSIS — Z96652 Presence of left artificial knee joint: Secondary | ICD-10-CM | POA: Diagnosis not present

## 2017-10-12 DIAGNOSIS — E785 Hyperlipidemia, unspecified: Secondary | ICD-10-CM | POA: Diagnosis not present

## 2017-10-12 DIAGNOSIS — I455 Other specified heart block: Secondary | ICD-10-CM | POA: Diagnosis not present

## 2017-10-12 DIAGNOSIS — I4510 Unspecified right bundle-branch block: Secondary | ICD-10-CM | POA: Diagnosis not present

## 2017-10-12 DIAGNOSIS — I495 Sick sinus syndrome: Secondary | ICD-10-CM | POA: Diagnosis not present

## 2017-10-12 DIAGNOSIS — I341 Nonrheumatic mitral (valve) prolapse: Secondary | ICD-10-CM | POA: Diagnosis not present

## 2017-10-12 DIAGNOSIS — Z79899 Other long term (current) drug therapy: Secondary | ICD-10-CM | POA: Diagnosis not present

## 2017-10-12 HISTORY — PX: PACEMAKER IMPLANT: EP1218

## 2017-10-12 LAB — ECHOCARDIOGRAM COMPLETE
Height: 69 in
Weight: 2648 oz

## 2017-10-12 LAB — GLUCOSE, CAPILLARY: Glucose-Capillary: 99 mg/dL (ref 70–99)

## 2017-10-12 LAB — MAGNESIUM: Magnesium: 2 mg/dL (ref 1.7–2.4)

## 2017-10-12 LAB — SURGICAL PCR SCREEN
MRSA, PCR: NEGATIVE
Staphylococcus aureus: NEGATIVE

## 2017-10-12 LAB — D-DIMER, QUANTITATIVE (NOT AT ARMC): D-Dimer, Quant: 0.85 ug/mL-FEU — ABNORMAL HIGH (ref 0.00–0.50)

## 2017-10-12 LAB — TSH: TSH: 1.345 u[IU]/mL (ref 0.350–4.500)

## 2017-10-12 SURGERY — PACEMAKER IMPLANT

## 2017-10-12 MED ORDER — GENTAMICIN SULFATE 40 MG/ML IJ SOLN
INTRAMUSCULAR | Status: AC
  Filled 2017-10-12: qty 2

## 2017-10-12 MED ORDER — CHLORHEXIDINE GLUCONATE 4 % EX LIQD
60.0000 mL | Freq: Once | CUTANEOUS | Status: DC
  Administered 2017-10-12: 4 via TOPICAL

## 2017-10-12 MED ORDER — MIDAZOLAM HCL 5 MG/5ML IJ SOLN
INTRAMUSCULAR | Status: AC
  Filled 2017-10-12: qty 5

## 2017-10-12 MED ORDER — SODIUM CHLORIDE 0.9 % IV SOLN: INTRAVENOUS | Status: DC

## 2017-10-12 MED ORDER — SODIUM CHLORIDE 0.9% FLUSH: 3.0000 mL | INTRAVENOUS | Status: DC | PRN

## 2017-10-12 MED ORDER — CEFAZOLIN SODIUM-DEXTROSE 1-4 GM/50ML-% IV SOLN
1.0000 g | Freq: Four times a day (QID) | INTRAVENOUS | Status: AC
  Administered 2017-10-12 – 2017-10-13 (×3): 1 g via INTRAVENOUS
  Filled 2017-10-12 (×3): qty 50

## 2017-10-12 MED ORDER — IOPAMIDOL (ISOVUE-370) INJECTION 76%
INTRAVENOUS | Status: AC
  Filled 2017-10-12: qty 50

## 2017-10-12 MED ORDER — CEFAZOLIN SODIUM-DEXTROSE 2-4 GM/100ML-% IV SOLN
INTRAVENOUS | Status: AC
  Filled 2017-10-12: qty 100

## 2017-10-12 MED ORDER — CEFAZOLIN SODIUM-DEXTROSE 2-4 GM/100ML-% IV SOLN
2.0000 g | INTRAVENOUS | Status: AC
  Administered 2017-10-12: 2 g via INTRAVENOUS

## 2017-10-12 MED ORDER — FENTANYL CITRATE (PF) 100 MCG/2ML IJ SOLN
INTRAMUSCULAR | Status: AC
  Filled 2017-10-12: qty 2

## 2017-10-12 MED ORDER — HEPARIN (PORCINE) IN NACL 1000-0.9 UT/500ML-% IV SOLN
INTRAVENOUS | Status: AC
  Filled 2017-10-12: qty 500

## 2017-10-12 MED ORDER — LIDOCAINE HCL (PF) 1 % IJ SOLN
INTRAMUSCULAR | Status: DC | PRN
  Administered 2017-10-12: 60 mL

## 2017-10-12 MED ORDER — IOPAMIDOL (ISOVUE-370) INJECTION 76%
INTRAVENOUS | Status: DC | PRN
  Administered 2017-10-12: 15 mL via INTRAVENOUS

## 2017-10-12 MED ORDER — MIDAZOLAM HCL 5 MG/5ML IJ SOLN
INTRAMUSCULAR | Status: DC | PRN
  Administered 2017-10-12 (×3): 1 mg via INTRAVENOUS

## 2017-10-12 MED ORDER — SODIUM CHLORIDE 0.9 % IV SOLN
80.0000 mg | INTRAVENOUS | Status: AC
  Administered 2017-10-12: 80 mg

## 2017-10-12 MED ORDER — LIDOCAINE HCL 1 % IJ SOLN
INTRAMUSCULAR | Status: AC
  Filled 2017-10-12: qty 60

## 2017-10-12 MED ORDER — ACETAMINOPHEN 325 MG PO TABS
325.0000 mg | ORAL_TABLET | ORAL | Status: DC | PRN
  Administered 2017-10-12 – 2017-10-13 (×3): 650 mg via ORAL
  Filled 2017-10-12 (×3): qty 2

## 2017-10-12 MED ORDER — SODIUM CHLORIDE 0.9% FLUSH: 3.0000 mL | Freq: Two times a day (BID) | INTRAVENOUS | Status: DC

## 2017-10-12 MED ORDER — HEPARIN (PORCINE) IN NACL 1000-0.9 UT/500ML-% IV SOLN
INTRAVENOUS | Status: DC | PRN
  Administered 2017-10-12: 500 mL

## 2017-10-12 MED ORDER — FENTANYL CITRATE (PF) 100 MCG/2ML IJ SOLN
INTRAMUSCULAR | Status: DC | PRN
  Administered 2017-10-12 (×2): 25 ug via INTRAVENOUS
  Administered 2017-10-12: 12.5 ug via INTRAVENOUS

## 2017-10-12 MED ORDER — CHLORHEXIDINE GLUCONATE 4 % EX LIQD
60.0000 mL | Freq: Once | CUTANEOUS | Status: DC
  Filled 2017-10-12: qty 15

## 2017-10-12 MED ORDER — SODIUM CHLORIDE 0.9 % IV SOLN: 250.0000 mL | INTRAVENOUS | Status: DC

## 2017-10-12 SURGICAL SUPPLY — 9 items
CABLE SURGICAL S-101-97-12 (CABLE) ×3 IMPLANT
IPG PACE AZUR XT DR MRI W1DR01 (Pacemaker) ×1 IMPLANT
KIT MICROPUNCTURE NIT STIFF (SHEATH) ×3 IMPLANT
LEAD CAPSURE NOVUS 5076-52CM (Lead) ×3 IMPLANT
LEAD CAPSURE NOVUS 5076-58CM (Lead) ×3 IMPLANT
PACE AZURE XT DR MRI W1DR01 (Pacemaker) ×3 IMPLANT
PAD DEFIB LIFELINK (PAD) ×3 IMPLANT
SHEATH CLASSIC 7F (SHEATH) ×6 IMPLANT
TRAY PACEMAKER INSERTION (PACKS) ×3 IMPLANT

## 2017-10-12 NOTE — Progress Notes (Signed)
Pt back from cath lab. Pt A&Ox4, left arm in sling, surgicial incision to left chest is CDI. No complaints at this time. Pt educated about post pacemaker precautions. Pt understands without assistance. Updated with POC and post surgery instructions. Pt resting in bed. VSS, WCTM.

## 2017-10-12 NOTE — Progress Notes (Signed)
  Echocardiogram 2D Echocardiogram has been performed.  Alexander [name] 10/12/2017, 10:33 AM
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
## 2017-10-12 NOTE — Plan of Care (Signed)
  Problem: Clinical Measurements: Goal: Respiratory complications will improve Outcome: Progressing   Problem: Activity: Goal: Risk for activity intolerance will decrease Outcome: Progressing   Problem: Nutrition: Goal: Adequate nutrition will be maintained Outcome: Progressing   Problem: Pain Managment: Goal: General experience of comfort will improve Outcome: Progressing   Problem: Skin Integrity: Goal: Risk for impaired skin integrity will decrease Outcome: Progressing   

## 2017-10-12 NOTE — Progress Notes (Signed)
Patient is in cath lab to get pacemaker for syncope/pauses.  Plan per cardiology. Eulogio Bear DO

## 2017-10-12 NOTE — Progress Notes (Signed)
Pt had 13 second pause followed by a 9 second pause. Notified by CCMD. Pt was asleep but was easily aroused. MD notified.

## 2017-10-13 ENCOUNTER — Encounter (HOSPITAL_COMMUNITY): Payer: Self-pay | Admitting: Cardiology

## 2017-10-13 ENCOUNTER — Inpatient Hospital Stay (HOSPITAL_COMMUNITY): Payer: Medicare Other

## 2017-10-13 LAB — GLUCOSE, CAPILLARY: Glucose-Capillary: 91 mg/dL (ref 70–99)

## 2017-10-13 NOTE — Discharge Summary (Addendum)
ELECTROPHYSIOLOGY PROCEDURE DISCHARGE SUMMARY    Patient ID: Alexander [name],  MRN: [id number], DOB/AGE: 1940-04-13 77 y.o.  Admit date: 10/11/2017 Discharge date: 10/13/2017  Primary Care Physician: [name], MD Primary Cardiologist: Dr. [name] Electrophysiologist: new, Dr. Curt Bears  Primary Discharge Diagnosis:  1. Recurrent syncope 2. Sinus arrest  Secondary Discharge Diagnosis:  1. HTN 2. HLD 3. RBBB  Allergies  Allergen Reactions  . Nuts Hives, Itching and Swelling    Possible reaction to hazelnuts (tongue swelling)     Procedures This Admission:  1.  Implantation of a MDT dual chamber PPM on 10/12/17 by Dr Curt Bears.  The patient received a  There were no immediate post procedure complications. 2.  CXR on 10/13/17 demonstrated no pneumothorax status post device implantation.   Brief HPI: Alexander [name] is a 77 y.o. male was 2/2 to recurrent syncope.  He had between Sat and Sunday the patient has had 3 full syncopal events, and one near syncope.  The first he attributed to being out in the hot weather, cleaning his grill, felt lightheaded, sat , though persisted and layed down until it passed.  He suspected being dehydrate.  The second he was standing at a wedding, and fainted with little if any warning, was helped to ended up seated on the pew.  The third on the way home from the wedding as a passenger.  His wife was driving, she said he made a noise, and was out, had some shaking arm movements, and then was awake.  After this they went to [facility] ER, was evaluate with no clear abnormalities went home.  Yesterday AM, fainted in the bathroom, striking his head.  (Head CT without ICH/injury)    Hospital Course:  The patient was brought to [facility] admitted, required laceration repair in the ER.  The 3 episodes of syncope had no warning, the patient reported no post syncope symptoms, felt confused about what had happened in that moment though no nausea, no persistent  weakness, no CP or SOB.  He had no pre-syncopal symptoms or warning either.  The patient had traveld the week prior, d-dimer was minimally elevated, though no symptoms, exam findings, EKG or echo suggestions of PE.  TTE noted normal LVEF 60-65%, bileaflet MVP with moderate myxomatous changes and posteriorly directed jet There was mild to moderate regurgitation.  He follows with Dr. [name] who is aware of echo findings and patient will continue f/u with her as usual.  Overnight he had an episode of S arrest, 13 followed by 10 seconds, and total of 25 seconds prior to returning to NSR from his scape rhythm.  Though this was nocturnal, given his presentation of recurrent syncope that sounded of Carrier syncope, in d/w the patient (who is a recently retired Doctor, general practice, his wife and his daughter (a Market researcher [place] MD), PPM implant vs monitoring was discussed, and decided to proceed with PPM implantation.  No reversible causes were found and underwent implantation of a PPM with details as outlined above.  He was monitored on telemetry overnight which demonstrated SR/ occ A pacing.  His resting Sinus HR pre-op was in the 50's, his base pacing rate reduced to 50bpm.  Left chest was without hematoma or ecchymosis.  The device was interrogated and found to be functioning normally.  CXR was obtained and demonstrated no pneumothorax status post device implantation.  Wound care, arm mobility, and restrictions were reviewed with the patient.  The patient feels well this morning, no  SOB no CP, he was examined by Dr. Curt Bears and considered stable for discharge to home.   Discussed staple removal to be done in 7-10 days.  To keep this wound clean and dry as well. The patient was instructed no driving for 6 months, made aware of [place] law.   Physical Exam: Vitals:   10/12/17 1900 10/12/17 1950 10/12/17 2320 10/13/17 0338  BP: 125/60 131/75 (!) 120/57 118/62  Pulse: (!) 59 61 66 61  Resp:  18 16 18     Temp:  97.7 F (36.5 C) 97.8 F (36.6 C) (!) 97.5 F (36.4 C)  TempSrc:  Oral Oral Oral  SpO2: 97% 95% 96% 96%  Weight:    170 lb 4.8 oz (77.2 kg)  Height:        GEN- The patient is well appearing, alert and oriented x 3 today.   HEENT: normocephalic, scalp laceration is clean dry, staples intact; sclera clear, conjunctiva pink; hearing intact; oropharynx clear; neck supple, no JVP Lungs- CTA b/, normal work of breathing.  No wheezes, rales, rhonchi Heart- RRR, no murmurs, rubs or gallops, PMI not laterally displaced GI- soft, non-tender, non-distended Extremities- no clubbing, cyanosis, or edema MS- no significant deformity or atrophy Skin- warm and dry, no rash or lesion, left chest without hematoma/ecchymosis Psych- euthymic mood, full affect Neuro- no gross deficits   Labs:   Lab Results  Component Value Date   WBC 8.1 10/10/2017   HGB 13.4 10/10/2017   HCT 39.2 10/10/2017   MCV 92.7 10/10/2017   PLT 210 10/10/2017    Recent Labs  Lab 10/10/17 1957  NA 141  K 3.8  CL 106  CO2 27  BUN 19  CREATININE 0.84  CALCIUM 9.0  GLUCOSE 102*    Discharge Medications:  Allergies as of 10/13/2017      Reactions   Nuts Hives, Itching, Swelling   Possible reaction to hazelnuts (tongue swelling)      Medication List    STOP taking these medications   ondansetron 8 MG tablet Commonly known as:  ZOFRAN     TAKE these medications   ibuprofen 200 MG tablet Commonly known as:  ADVIL,MOTRIN Take 400 mg by mouth daily as needed for headache (pain).   losartan 25 MG tablet Commonly known as:  COZAAR TAKE ONE TABLET BY MOUTH ONCE DAILY   multivitamin with minerals Tabs tablet Take 1 tablet by mouth daily.   rosuvastatin 5 MG tablet Commonly known as:  CRESTOR TAKE ONE TABLET EACH DAY   zolpidem 10 MG tablet Commonly known as:  AMBIEN TAKE ONE TABLET AT BEDTIME AS NEEDED What changed:    how much to take  how to take this  when to take this        Disposition:  Home Discharge Instructions    Diet - low sodium heart healthy   Complete by:  As directed    Increase activity slowly   Complete by:  As directed      Follow-up Information    [facility] Follow up on 10/22/2017.   Specialty:  Cardiology Why:  9:00AM, wound check visit Contact information: [address] [place]       Constance Haw, MD Follow up on 01/12/2018.   Specialty:  Cardiology Why:  11:30AM Contact information: [place] [address] [phone]           Duration of Discharge  Encounter: Greater than 30 minutes including physician time.  Signed, Tommye Standard, PA-C 10/13/2017 9:45 AM   I have seen and examined this patient with Tommye Standard.  Agree with above, note added to reflect my findings.  On exam, RRR, no murmurs, lungs clear.  Letter to the hospital after multiple episodes of syncope.  He was found to have sinus arrest with a 23-second pause overnight.  He had a Medtronic dual-chamber pacemaker implanted.  Chest x-ray and interrogation without issue.  Plan for discharge with follow-up in device clinic.  Will M. Camnitz MD 10/13/2017 10:51 AM

## 2017-10-13 NOTE — Discharge Instructions (Signed)
Keep head/scalp laceration/wound clean and dry Please call your primary are doctor to have staples removed 7-10 days (from placement)      Supplemental Discharge Instructions for  Pacemaker/Defibrillator Patients  Activity No heavy lifting or vigorous activity with your left/right arm for 6 to 8 weeks.  Do not raise your left/right arm above your head for one week.  Gradually raise your affected arm as drawn below.              10/16/17                      10/17/17                    10/18/17                 10/19/17 __  NO DRIVING for 6 months.  WOUND CARE - Keep the wound area clean and dry.  Do not get this area wet, no showers until cleared to at your wound check visit . - The tape/steri-strips on your wound will fall off; do not pull them off.  No bandage is needed on the site.  DO  NOT apply any creams, oils, or ointments to the wound area. - If you notice any drainage or discharge from the wound, any swelling or bruising at the site, or you develop a fever > 101? F after you are discharged home, call the office at once.  Special Instructions - You are still able to use cellular telephones; use the ear opposite the side where you have your pacemaker/defibrillator.  Avoid carrying your cellular phone near your device. - When traveling through airports, show security personnel your identification card to avoid being screened in the metal detectors.  Ask the security personnel to use the hand wand. - Avoid arc welding equipment, MRI testing (magnetic resonance imaging), TENS units (transcutaneous nerve stimulators).  Call the office for questions about other devices. - Avoid electrical appliances that are in poor condition or are not properly grounded. - Microwave ovens are safe to be near or to operate.  Additional information for defibrillator patients should your device go off: - If your device goes off ONCE and you feel fine afterward, notify the device clinic nurses. - If your  device goes off ONCE and you do not feel well afterward, call 911. - If your device goes off TWICE, call 911. - If your device goes off THREE times in one day, call 911.  DO NOT DRIVE YOURSELF OR A FAMILY MEMBER WITH A DEFIBRILLATOR TO THE HOSPITAL--CALL 911.

## 2017-10-22 ENCOUNTER — Ambulatory Visit (INDEPENDENT_AMBULATORY_CARE_PROVIDER_SITE_OTHER): Payer: Medicare Other | Admitting: *Deleted

## 2017-10-22 ENCOUNTER — Telehealth: Payer: Self-pay | Admitting: *Deleted

## 2017-10-22 DIAGNOSIS — Z950 Presence of cardiac pacemaker: Secondary | ICD-10-CM | POA: Diagnosis not present

## 2017-10-22 DIAGNOSIS — I455 Other specified heart block: Secondary | ICD-10-CM

## 2017-10-22 LAB — CUP PACEART INCLINIC DEVICE CHECK
Battery Remaining Longevity: 171 mo
Battery Voltage: 3.18 V
Brady Statistic AP VP Percent: 0.01 %
Brady Statistic AP VS Percent: 22.78 %
Brady Statistic AS VP Percent: 0.03 %
Brady Statistic AS VS Percent: 77.18 %
Brady Statistic RA Percent Paced: 22.97 %
Brady Statistic RV Percent Paced: 0.04 %
Date Time Interrogation Session: 20190815100355
Implantable Lead Implant Date: 20190805
Implantable Lead Implant Date: 20190805
Implantable Lead Location: 753859
Implantable Lead Location: 753860
Implantable Lead Model: 5076
Implantable Lead Model: 5076
Implantable Pulse Generator Implant Date: 20190805
Lead Channel Impedance Value: 342 Ohm
Lead Channel Impedance Value: 437 Ohm
Lead Channel Impedance Value: 456 Ohm
Lead Channel Impedance Value: 646 Ohm
Lead Channel Pacing Threshold Amplitude: 0.5 V
Lead Channel Pacing Threshold Amplitude: 0.75 V
Lead Channel Pacing Threshold Pulse Width: 0.4 ms
Lead Channel Pacing Threshold Pulse Width: 0.4 ms
Lead Channel Sensing Intrinsic Amplitude: 5.1 mV
Lead Channel Sensing Intrinsic Amplitude: 6.9 mV
Lead Channel Setting Pacing Amplitude: 3.5 V
Lead Channel Setting Pacing Amplitude: 3.5 V
Lead Channel Setting Pacing Pulse Width: 0.4 ms
Lead Channel Setting Sensing Sensitivity: 1.2 mV

## 2017-10-22 NOTE — Telephone Encounter (Signed)
Patient made aware. He is appreciative of information and denies questions or concerns at this time.

## 2017-10-22 NOTE — Telephone Encounter (Signed)
-----   Message from Will Meredith Leeds, MD sent at 10/22/2017  1:13 PM EDT ----- Regarding: RE: driving restrictions He should be fine to drive with his pacemaker in place. ----- Message ----- From: Mechele Dawley, RN Sent: 10/22/2017  11:05 AM EDT To: Constance Haw, MD Subject: driving restrictions                           Dr. Curt Bears--  Please advise when patient can resume driving. D/C instructions say 6 months per [place] DMV (implanted for sinus arrest and syncope). Is this accurate?  Thanks, Raquel Sarna

## 2017-10-22 NOTE — Progress Notes (Signed)
Wound check appointment. Steri-strips removed. Wound without redness or edema. Incision edges approximated, wound well healed. Normal device function. RA threshold, sensing, and impedance consistent with implant measurements. RV threshold, sensing, and impedance trends stable since implant, R-waves measured 6.13mV at implant through device, 4.9-5.43mV in office today. Device programmed at 3.5V with auto capture programmed on for extra safety margin until 3 month visit. Histogram distribution appropriate for patient and level of activity. No mode switches or high ventricular rates noted. Patient educated about wound care, arm mobility, lifting restrictions, and [facility] app. ROV with WC on 01/12/18.  Patient requests clarification regarding driving restrictions. Will review with Dr. Curt Bears.

## 2017-10-26 ENCOUNTER — Ambulatory Visit: Payer: Medicare Other

## 2017-11-12 ENCOUNTER — Encounter: Payer: Self-pay | Admitting: Cardiology

## 2017-11-13 ENCOUNTER — Encounter: Payer: Self-pay | Admitting: Cardiology

## 2017-11-13 ENCOUNTER — Ambulatory Visit (INDEPENDENT_AMBULATORY_CARE_PROVIDER_SITE_OTHER): Payer: Medicare Other | Admitting: Cardiology

## 2017-11-13 VITALS — BP 146/64 | HR 73 | Ht 69.0 in | Wt 175.4 lb

## 2017-11-13 DIAGNOSIS — I2584 Coronary atherosclerosis due to calcified coronary lesion: Secondary | ICD-10-CM | POA: Diagnosis not present

## 2017-11-13 DIAGNOSIS — E7849 Other hyperlipidemia: Secondary | ICD-10-CM | POA: Diagnosis not present

## 2017-11-13 DIAGNOSIS — I10 Essential (primary) hypertension: Secondary | ICD-10-CM | POA: Diagnosis not present

## 2017-11-13 DIAGNOSIS — I2510 Atherosclerotic heart disease of native coronary artery without angina pectoris: Secondary | ICD-10-CM

## 2017-11-13 DIAGNOSIS — Z950 Presence of cardiac pacemaker: Secondary | ICD-10-CM | POA: Diagnosis not present

## 2017-11-13 DIAGNOSIS — I341 Nonrheumatic mitral (valve) prolapse: Secondary | ICD-10-CM | POA: Diagnosis not present

## 2017-11-13 DIAGNOSIS — I4510 Unspecified right bundle-branch block: Secondary | ICD-10-CM

## 2017-11-13 NOTE — Patient Instructions (Signed)
Medication Instructions:   Your physician recommends that you continue on your current medications as directed. Please refer to the Current Medication list given to you today.    Testing/Procedures:  Your physician has requested that you have an echocardiogram. Echocardiography is a painless test that uses sound waves to create images of your heart. It provides your doctor with information about the size and shape of your heart and how well your heart's chambers and valves are working. This procedure takes approximately one hour. There are no restrictions for this procedure.  PER DR NELSON SCHEDULE THIS TODAY--FOR 6 MONTHS OUT PRIOR TO HIS 6 MONTH FOLLOW-UP APPOINTMENT WITH HER      Follow-Up:  Your physician wants you to follow-up in: [place] will receive a reminder letter in the mail two months in advance. If you don't receive a letter, please call our office to schedule the follow-up appointment.          If you need a refill on your cardiac medications before your next appointment, please call your pharmacy.

## 2017-11-13 NOTE — Progress Notes (Signed)
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 The patient has been followed by Dr. Mare Ferrari for years. He does avid cardio exercise 3 times a week, plays golf, travels. He has retired in April 2019.  I saw him in June 2019 and he was doing great.  He presented to the hospital on October 11, 2017 with 3 episodes of syncope, he was diagnosed with sinus arrest and 15-second pause and he was implanted MDT dual chamber PPM on 10/12/17 by Dr Curt Bears.  There were no immediate post procedure complications.  He is coming after many months, he states that he feels great, he is just tired from not being able to exercise, he just started using a treadmill yesterday, he was instructed to not to use his left arm for sports for 6 weeks post implant, he denies any fever or chills, no discharge from the implantation site, otherwise completely asymptomatic.  He had an echocardiogram performed in the hospital that showed moderate mitral regurgitation.   Past Medical History:  Diagnosis Date  . Hyperlipidemia   . Hypertension   . MVP (mitral valve prolapse)    mild  . Syncope    Past Surgical History:  Procedure Laterality Date  . PACEMAKER IMPLANT N/A 10/12/2017   Procedure: PACEMAKER IMPLANT;  Surgeon: Constance Haw, MD;  Location: [facility];  Service: Cardiovascular;  Laterality: N/A;  . TONSILECTOMY, ADENOIDECTOMY, BILATERAL MYRINGOTOMY AND TUBES     as child  . TOTAL KNEE ARTHROPLASTY Left    Current Medications: Outpatient Medications Prior to Visit  Medication Sig Dispense Refill  . ibuprofen (ADVIL,MOTRIN) 200  MG tablet Take 400 mg by mouth daily as needed for headache (pain).     Marland Kitchen losartan (COZAAR) 25 MG tablet TAKE ONE TABLET BY MOUTH ONCE DAILY 30 tablet 10  . Multiple Vitamin (MULTIVITAMIN WITH MINERALS) TABS tablet Take 1 tablet by mouth daily.    . rosuvastatin (CRESTOR) 5 MG tablet TAKE ONE TABLET EACH DAY 30 tablet 10  . zolpidem (AMBIEN) 10 MG tablet TAKE ONE TABLET AT BEDTIME AS NEEDED (Patient taking differently: TAKE ONE TABLET AT BEDTIME AS NEEDED FOR SLEEP) 90 tablet 0   No facility-administered medications prior to visit.     Allergies:   Nuts   Social History   Socioeconomic History  . Marital status: Married    Spouse name: Not on file  . Number of children: Not on file  . Years of education: Not on file  . Highest education level: Not on file  Occupational History  . Not on file  Social Needs  . Financial resource strain: Not on file  . Food insecurity:    Worry: Not on file    Inability: Not on file  . Transportation needs:    Medical: Not on file    Non-medical: Not on file  Tobacco Use  . Smoking status: Never Smoker  . Smokeless tobacco: Never Used  Substance and Sexual Activity  . Alcohol use: Yes    Alcohol/week: 5.0 standard drinks    Types: 5 Glasses of wine per week  .  Drug use: No  . Sexual activity: Not on file  Lifestyle  . Physical activity:    Days per week: Not on file    Minutes per session: Not on file  . Stress: Not on file  Relationships  . Social connections:    Talks on phone: Not on file    Gets together: Not on file    Attends religious service: Not on file    Active member of club or organization: Not on file    Attends meetings of clubs or organizations: Not on file    Relationship status: Not on file  Other Topics Concern  . Not on file  Social History Narrative  . Not on file    Family History:  The patient's family history includes Alzheimer's disease in his mother; Hypertension in his father.   ROS:   Please see the  history of present illness.    ROS All other systems reviewed and are negative.  PHYSICAL EXAM:   VS:  BP (!) 146/64   Pulse 73   Ht 5\' 9"  (1.753 m)   Wt 175 lb 6.4 oz (79.6 kg)   SpO2 98%   BMI 25.90 kg/m    GEN: Well nourished, well developed, in no acute distress  HEENT: normal  Neck: no JVD, carotid bruits, or masses Cardiac: RRR; 2 out of 6 mid to end murmurs, rubs, or gallops,no edema  Respiratory:  clear to auscultation bilaterally, normal work of breathing GI: soft, nontender, nondistended, + BS MS: no deformity or atrophy  Skin: warm and dry, no rash Neuro:  Alert and Oriented x 3, Strength and sensation are intact Psych: euthymic mood, full affect  Wt Readings from Last 3 Encounters:  11/13/17 175 lb 6.4 oz (79.6 kg)  10/13/17 170 lb 4.8 oz (77.2 kg)  08/10/17 167 lb 12.8 oz (76.1 kg)    Studies/Labs Reviewed:   EKG:  EKG is ordered today.  It shows sinus bradycardia, right bundle branch block, unchanged from prior.  This was personally reviewed.  Recent Labs: 10/10/2017: BUN 19; Creatinine, Ser 0.84; Hemoglobin 13.4; Platelets 210; Potassium 3.8; Sodium 141 10/12/2017: Magnesium 2.0; TSH 1.345   Lipid Panel No results found for: CHOL, TRIG, HDL, CHOLHDL, VLDL, LDLCALC, LDLDIRECT  Additional studies/ records that were reviewed today include:   TTE: 10/12/2017 - Left ventricle: The cavity size was normal. Wall thickness was   increased in a pattern of mild LVH. Systolic function was normal.   The estimated ejection fraction was in the range of 60% to 65%.   Left ventricular diastolic function parameters were normal. - Mitral valve: Bileaflet MVP with moderate myxomatous changes and   posteriorly directed jet There was mild to moderate   regurgitation.   ASSESSMENT:    1. MVP (mitral valve prolapse)   2. Essential hypertension   3. Right bundle branch block   4. Cardiac pacemaker in situ   5. Other hyperlipidemia     PLAN:  In order of problems listed  above:  1. Status post pacemaker implantation for sinus arrest, he can start doing cardio without use of arms now and start playing golf in 2 weeks.  Pacemakers working well. 2. MVP, his most recent echocardiogram shows bilateral prolapse, leaflets are clearly myxomatous, there is no obvious calcification, he is jet is posteriorly directed and degree of regurgitation appears moderate possibly moderate to severe.  He has normal size of the left atrium and normal right-sided pressure.  I would repeat his echo  in 6 months, once his MR appears severe we will arrange for TEE and repair by Dr. [name] when via minithoracotomy 3. Right bundle branch block, chronic unchanged from prior. 4. Hypertension - well controlled, he would like to try discontinue losartan, he is on minimal dose, he is advised that preferably for afterload reduction for mitral regurgitation is a good drug for him but if he prefers to be without this drug he can try to discontinue and unless his blood pressure goes over 130/80 he does not need to restart it.  5. Hyperlipidemia, on Crestor 5 mg daily, followed by Dr. Henrene Pastor knee, all lipids at goal in 2016 and per patient also this year. 5. Coronary calcium score of 94. This was 10 percentile for age and sex  matched control.  He is already on Crestor, I would continue the same management given low percentile for age and sex high level of exercise and good diet. 6.  Ascending aortic diameter of 39 mm, he is explained that this is still within normal limits, we will recheck on his echocardiogram next year.  Follow-up in 6 months, with echocardiogram prior to that visit.  Medication Adjustments/Labs and Tests Ordered: Current medicines are reviewed at length with the patient today.  Concerns regarding medicines are outlined above.  Medication changes, Labs and Tests ordered today are listed in the Patient Instructions below. Patient Instructions  Medication Instructions:   Your physician  recommends that you continue on your current medications as directed. Please refer to the Current Medication list given to you today.    Testing/Procedures:  Your physician has requested that you have an echocardiogram. Echocardiography is a painless test that uses sound waves to create images of your heart. It provides your doctor with information about the size and shape of your heart and how well your heart's chambers and valves are working. This procedure takes approximately one hour. There are no restrictions for this procedure.  PER DR Anyi Fels SCHEDULE THIS TODAY--FOR 6 MONTHS OUT PRIOR TO HIS 6 MONTH FOLLOW-UP APPOINTMENT WITH HER      Follow-Up:  Your physician wants you to follow-up in: [place] will receive a reminder letter in the mail two months in advance. If you don't receive a letter, please call our office to schedule the follow-up appointment.          If you need a refill on your cardiac medications before your next appointment, please call your pharmacy.      Signed, [name], MD  11/13/2017 12:39 PM    [facility] [address], [place], [place]  [address] Phone: [phone]; Fax: [phone]

## 2017-11-17 DIAGNOSIS — Z23 Encounter for immunization: Secondary | ICD-10-CM | POA: Diagnosis not present

## 2018-01-05 ENCOUNTER — Telehealth: Payer: Self-pay | Admitting: Cardiology

## 2018-01-05 ENCOUNTER — Ambulatory Visit (INDEPENDENT_AMBULATORY_CARE_PROVIDER_SITE_OTHER): Payer: Medicare Other | Admitting: *Deleted

## 2018-01-05 DIAGNOSIS — I455 Other specified heart block: Secondary | ICD-10-CM | POA: Diagnosis not present

## 2018-01-05 NOTE — Telephone Encounter (Signed)
Patient called and stated that he had an event today where he almost passed out. He said it last about 3 minutes. He stated that he feels fine today but he has experienced this same feeling 3 times today. Remote transmission received. Pt aware that a Device Tech RN will review and call him back with the results.

## 2018-01-05 NOTE — Telephone Encounter (Signed)
Remote reviewed. Presenting rhythm: AsVs62bpm. No AT/AF episodes recorded. (1) VT-NS episode x 5bts on 12/29/17. 25%Ap/<0.1%Vp. Stable lead measurements. Normal device function.  Called to inform patient about the results of the remote transmission. Patient and wife could not believe that patient did not have an episode during the time of his presyncopal sx's. Wife requested that patient f/u with the device clinic today. Patient added to the schedule. Patient to arrive in 5 minutes.

## 2018-01-06 ENCOUNTER — Other Ambulatory Visit: Payer: Self-pay | Admitting: Cardiology

## 2018-01-06 ENCOUNTER — Telehealth: Payer: Self-pay | Admitting: Cardiology

## 2018-01-06 NOTE — Telephone Encounter (Signed)
Noted a message sent from EP Scheduler that per device team and Dr Curt Bears, he needs to see Dr [name] at her very next available, to address these issues.  Dr Francesca Oman scheduler will add him to her scheduled tomorrow at 0900, to follow-up on this. This is an open slot.  Will send to make Dr [name] aware of this as well.

## 2018-01-06 NOTE — Telephone Encounter (Signed)
Dr. [name] is aware that pt will come in to see her tomorrow at 0900.  This was previously scheduled.

## 2018-01-06 NOTE — Telephone Encounter (Signed)
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  Pt states device checked him and everything was normal, he had no arrhythmias.  Pt states that now being his device is good, he is still periodically having dizzy spells, which are relieved by sitting down for a few minutes.  Pt states he is wondering if this could be his BP causing his issue of feeling lightheaded and dizzy.  Pt states he takes his pressures every morning, and he usually runs in the 130s/60s, but at his last OV Dr [name] wanted him to run in the 867E systolic and lower. Pt states he is currently running at around 116/60, which he states he has never been.  Informed the pt that this is an acceptable and normal pressure.  Asked the pt if he is hydrating appropriately, and pushing fluids when he feels his "pressures drop?"  Pt replied he is not drinking fluids as he should.  Advised the pt to push fluids and stay very hydrated.  Advised the pt that if he could, try obtaining orthostatic BPs on himself, and call them into the office or mychart them to Dr [name] and myself.  Pt states that's a great idea and he will attempt to at least obtain his pressure from a seated position to a standing position (pt is very aware how to properly do orthostatics).   Pt states that otherwise, he is completely asymptomatic from a cardiac perspective. Informed the pt that Dr [name] is out of the office but I will route this message to her for further review and recommendation.  Informed the pt that I will follow-up thereafter. Pt verbalized understanding and agrees with this plan.

## 2018-01-06 NOTE — Telephone Encounter (Signed)
Please can you find spot on Alexander [name]'s or [place] schedule? I am not in clinic till 11/18. Thank you, KN

## 2018-01-06 NOTE — Telephone Encounter (Signed)
° °  Patient calling, request specifically to speak with nurse Ivy Please call

## 2018-01-07 ENCOUNTER — Ambulatory Visit (INDEPENDENT_AMBULATORY_CARE_PROVIDER_SITE_OTHER): Payer: Medicare Other | Admitting: Cardiology

## 2018-01-07 ENCOUNTER — Other Ambulatory Visit: Payer: Self-pay | Admitting: Cardiology

## 2018-01-07 ENCOUNTER — Encounter: Payer: Self-pay | Admitting: *Deleted

## 2018-01-07 ENCOUNTER — Ambulatory Visit (INDEPENDENT_AMBULATORY_CARE_PROVIDER_SITE_OTHER): Payer: Medicare Other

## 2018-01-07 VITALS — HR 95 | Ht 69.0 in | Wt 173.0 lb

## 2018-01-07 DIAGNOSIS — Z950 Presence of cardiac pacemaker: Secondary | ICD-10-CM | POA: Diagnosis not present

## 2018-01-07 DIAGNOSIS — R55 Syncope and collapse: Secondary | ICD-10-CM

## 2018-01-07 DIAGNOSIS — I2584 Coronary atherosclerosis due to calcified coronary lesion: Secondary | ICD-10-CM

## 2018-01-07 DIAGNOSIS — I2510 Atherosclerotic heart disease of native coronary artery without angina pectoris: Secondary | ICD-10-CM | POA: Diagnosis not present

## 2018-01-07 DIAGNOSIS — I341 Nonrheumatic mitral (valve) prolapse: Secondary | ICD-10-CM | POA: Diagnosis not present

## 2018-01-07 DIAGNOSIS — I340 Nonrheumatic mitral (valve) insufficiency: Secondary | ICD-10-CM | POA: Diagnosis not present

## 2018-01-07 NOTE — Patient Instructions (Addendum)
Medication Instructions:  1) DISCONTINUE Losartan  If you need a refill on your cardiac medications before your next appointment, please call your pharmacy.   Lab work: None If you have labs (blood work) drawn today and your tests are completely normal, you will receive your results only by: Marland Kitchen MyChart Message (if you have MyChart) OR . A paper copy in the mail If you have any lab test that is abnormal or we need to change your treatment, we will call you to review the results.  Testing/Procedures:  Your physician recommends that you wear a 24 hour blood pressure monitor.  Your physician has requested that you have an echocardiogram in early February 2020 prior to seeing Dr. [name]. Echocardiography is a painless test that uses sound waves to create images of your heart. It provides your doctor with information about the size and shape of your heart and how well your heart's chambers and valves are working. This procedure takes approximately one hour. There are no restrictions for this procedure.    Follow-Up: At [facility], you and your health needs are our priority.  As part of our continuing mission to provide you with exceptional heart care, we have created designated Provider Care Teams.  These Care Teams include your primary Cardiologist (physician) and Advanced Practice Providers (APPs -  Physician Assistants and Nurse Practitioners) who all work together to provide you with the care you need, when you need it. You will need a follow up appointment in 3 months.  Please call our office 2 months in advance to schedule this appointment.  You may see [name], MD or one of the following Advanced Practice Providers on your designated Care Team:   [place], PA-C Melina Copa, PA-C . Ermalinda Barrios, PA-C  Any Other Special Instructions Will Be Listed Below (If Applicable).

## 2018-01-07 NOTE — Progress Notes (Signed)
Cardiology Office Note    Date:  01/07/2018   ID:  Alexander [name], DOB [birth year], MRN [id number]  PCP:  [name], MD  Cardiologist:  [name], MD   Chief complain: Follow up for MVP, HLP  History of Present Illness:  Alexander [name] is a 77 y.o. 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 The patient has been followed by Dr. Mare Ferrari for years. He does avid cardio exercise 3 times a week, plays golf, travels. He has retired in April 2019.  I saw him in June 2019 and he was doing great.  He presented to the hospital on October 11, 2017 with 3 episodes of syncope, he was diagnosed with sinus arrest and 15-second pause and he was implanted MDT dual chamber PPM on 10/12/17 by Dr Curt Bears.  There were no immediate post procedure complications.  He is coming after many months, he states that he feels great, he is just tired from not being able to exercise, he just started using a treadmill yesterday, he was instructed to not to use his left arm for sports for 6 weeks post implant, he denies any fever or chills, no discharge from the implantation site, otherwise completely asymptomatic.  He had an echocardiogram performed in the hospital that showed moderate mitral regurgitation.  01/07/2018 -the patient is coming for an extra visit after 3 presyncopal episodes in 1 day not associated with full syncope or fall, they happen while he was walking around the house and every time he stood up and just did not feel well, they resolved after several minutes while he was sitting down.  He recently visited [place] was hiking and feeling well.  As a result of his symptoms he underwent pacemaker interrogation on Tuesday of this week with findings of normally functioning pacemaker no pauses no pacemaker failure.  Past Medical History:  Diagnosis Date  . Hyperlipidemia   . Hypertension    . MVP (mitral valve prolapse)    mild  . Syncope    Past Surgical History:  Procedure Laterality Date  . PACEMAKER IMPLANT N/A 10/12/2017   Procedure: PACEMAKER IMPLANT;  Surgeon: Constance Haw, MD;  Location: [facility];  Service: Cardiovascular;  Laterality: N/A;  . TONSILECTOMY, ADENOIDECTOMY, BILATERAL MYRINGOTOMY AND TUBES     as child  . TOTAL KNEE ARTHROPLASTY Left    Current Medications: Outpatient Medications Prior to Visit  Medication Sig Dispense Refill  . ibuprofen (ADVIL,MOTRIN) 200 MG tablet Take 400 mg by mouth daily as needed for headache (pain).     Marland Kitchen losartan (COZAAR) 25 MG tablet TAKE ONE TABLET BY MOUTH ONCE DAILY 30 tablet 10  . Multiple Vitamin (MULTIVITAMIN WITH MINERALS) TABS tablet Take 1 tablet by mouth daily.    . rosuvastatin (CRESTOR) 5 MG tablet TAKE ONE TABLET EACH DAY 30 tablet 10  . zolpidem (AMBIEN) 10 MG tablet TAKE ONE TABLET AT BEDTIME AS NEEDED 90 tablet 0   No facility-administered medications prior to visit.     Allergies:   Nuts   Social History   Socioeconomic History  . Marital status: Married    Spouse name: Not on file  . Number of children: Not on file  . Years of education: Not on file  . Highest education level: Not on file  Occupational History  . Not on file  Social Needs  . Financial resource strain: Not on file  .  Food insecurity:    Worry: Not on file    Inability: Not on file  . Transportation needs:    Medical: Not on file    Non-medical: Not on file  Tobacco Use  . Smoking status: Never Smoker  . Smokeless tobacco: Never Used  Substance and Sexual Activity  . Alcohol use: Yes    Alcohol/week: 5.0 standard drinks    Types: 5 Glasses of wine per week  . Drug use: No  . Sexual activity: Not on file  Lifestyle  . Physical activity:    Days per week: Not on file    Minutes per session: Not on file  . Stress: Not on file  Relationships  . Social connections:    Talks on phone: Not on file     Gets together: Not on file    Attends religious service: Not on file    Active member of club or organization: Not on file    Attends meetings of clubs or organizations: Not on file    Relationship status: Not on file  Other Topics Concern  . Not on file  Social History Narrative  . Not on file    Family History:  The patient's family history includes Alzheimer's disease in his mother; Hypertension in his father.   ROS:   Please see the history of present illness.    ROS All other systems reviewed and are negative.  PHYSICAL EXAM:   VS:  Pulse 95   Ht 5\' 9"  (1.753 m)   Wt 173 lb (78.5 kg)   BMI 25.55 kg/m    GEN: Well nourished, well developed, in no acute distress  HEENT: normal  Neck: no JVD, carotid bruits, or masses Cardiac: RRR; 2 out of 6 mid to end murmurs, rubs, or gallops,no edema  Respiratory:  clear to auscultation bilaterally, normal work of breathing GI: soft, nontender, nondistended, + BS MS: no deformity or atrophy  Skin: warm and dry, no rash Neuro:  Alert and Oriented x 3, Strength and sensation are intact Psych: euthymic mood, full affect  Wt Readings from Last 3 Encounters:  01/07/18 173 lb (78.5 kg)  11/13/17 175 lb 6.4 oz (79.6 kg)  10/13/17 170 lb 4.8 oz (77.2 kg)    Studies/Labs Reviewed:   EKG:  EKG is ordered today.  It shows atrial paced rhythm, right bundle branch block, unchanged from prior.  This was personally reviewed.  Recent Labs: 10/10/2017: BUN 19; Creatinine, Ser 0.84; Hemoglobin 13.4; Platelets 210; Potassium 3.8; Sodium 141 10/12/2017: Magnesium 2.0; TSH 1.345   Lipid Panel No results found for: CHOL, TRIG, HDL, CHOLHDL, VLDL, LDLCALC, LDLDIRECT  Additional studies/ records that were reviewed today include:   TTE: 10/12/2017 - Left ventricle: The cavity size was normal. Wall thickness was   increased in a pattern of mild LVH. Systolic function was normal.   The estimated ejection fraction was in the range of 60% to 65%.   Left  ventricular diastolic function parameters were normal. - Mitral valve: Bileaflet MVP with moderate myxomatous changes and   posteriorly directed jet There was mild to moderate   regurgitation.   ASSESSMENT:    1. Syncope, unspecified syncope type   2. MVP (mitral valve prolapse)   3. Cardiac pacemaker in situ     PLAN:  In order of problems listed above:  1. Recurrent syncope and presyncope, seems to be related to orthostatic hypotension's, orthostatic vitals abnormal today, advised about drinking plenty of water and an electrolyte drink  after prolonged exercise, also I will discontinue losartan, will send him home with a blood pressure monitor. 2. Status post pacemaker implantation for sinus arrest, pacemaker interrogated on Tuesday and functioning well.   3. MVP, his most recent echocardiogram shows bilateral prolapse, leaflets are clearly myxomatous, there is no obvious calcification, he is jet is posteriorly directed and degree of regurgitation appears moderate possibly moderate to severe.  He has normal size of the left atrium and normal right-sided pressure.  I would repeat his echo in 3 months, once his MR appears severe we will arrange for TEE and repair by Dr. [name] when via minithoracotomy 4. Right bundle branch block, chronic unchanged from prior. 5. Hypertension -as above, discontinue losartan. 6. Hyperlipidemia, on Crestor 5 mg daily, followed by Dr. Henrene Pastor knee, all lipids at goal in 2016 and per patient also this year. 5. Coronary calcium score of 94. This was 29 percentile for age and sex  matched control.  He is already on Crestor, I would continue the same management given low percentile for age and sex high level of exercise and good diet. 6.  Ascending aortic diameter of 39 mm, he is explained that this is still within normal limits, we will recheck on his echocardiogram next year.  Follow-up in 3 months, with echocardiogram prior to that visit.  Medication  Adjustments/Labs and Tests Ordered: Current medicines are reviewed at length with the patient today.  Concerns regarding medicines are outlined above.  Medication changes, Labs and Tests ordered today are listed in the Patient Instructions below. Patient Instructions  Medication Instructions:  1) DISCONTINUE Losartan  If you need a refill on your cardiac medications before your next appointment, please call your pharmacy.   Lab work: None If you have labs (blood work) drawn today and your tests are completely normal, you will receive your results only by: Marland Kitchen MyChart Message (if you have MyChart) OR . A paper copy in the mail If you have any lab test that is abnormal or we need to change your treatment, we will call you to review the results.  Testing/Procedures: Your physician recommends that you wear a 24 hour blood pressure monitor.  Follow-Up: At [facility], you and your health needs are our priority.  As part of our continuing mission to provide you with exceptional heart care, we have created designated Provider Care Teams.  These Care Teams include your primary Cardiologist (physician) and Advanced Practice Providers (APPs -  Physician Assistants and Nurse Practitioners) who all work together to provide you with the care you need, when you need it. You will need a follow up appointment in 3 months.  Please call our office 2 months in advance to schedule this appointment.  You may see [name], MD or one of the following Advanced Practice Providers on your designated Care Team:   [place], PA-C Melina Copa, PA-C . Ermalinda Barrios, PA-C  Any Other Special Instructions Will Be Listed Below (If Applicable).       Signed, [name], MD  01/07/2018 9:56 AM    [address], [place], [place]  [address] Phone: [phone]; Fax: [phone]

## 2018-01-07 NOTE — Progress Notes (Signed)
Patient ID: Alexander [name], male   DOB: 12/07/1940, 77 y.o.   MRN: [id number] 24 hour ambulatory blood pressure monitor applied using standard adult cuff

## 2018-01-08 LAB — CUP PACEART INCLINIC DEVICE CHECK
Battery Remaining Longevity: 167 mo
Battery Voltage: 3.15 V
Brady Statistic AP VP Percent: 0.01 %
Brady Statistic AP VS Percent: 26.47 %
Brady Statistic AS VP Percent: 0.03 %
Brady Statistic AS VS Percent: 73.48 %
Brady Statistic RA Percent Paced: 26.73 %
Brady Statistic RV Percent Paced: 0.05 %
Date Time Interrogation Session: 20191029200941
Implantable Lead Implant Date: 20190805
Implantable Lead Implant Date: 20190805
Implantable Lead Location: 753859
Implantable Lead Location: 753860
Implantable Lead Model: 5076
Implantable Lead Model: 5076
Implantable Pulse Generator Implant Date: 20190805
Lead Channel Impedance Value: 323 Ohm
Lead Channel Impedance Value: 399 Ohm
Lead Channel Impedance Value: 456 Ohm
Lead Channel Impedance Value: 513 Ohm
Lead Channel Pacing Threshold Amplitude: 0.5 V
Lead Channel Pacing Threshold Amplitude: 0.625 V
Lead Channel Pacing Threshold Pulse Width: 0.4 ms
Lead Channel Pacing Threshold Pulse Width: 0.4 ms
Lead Channel Sensing Intrinsic Amplitude: 4.75 mV
Lead Channel Sensing Intrinsic Amplitude: 4.75 mV
Lead Channel Sensing Intrinsic Amplitude: 5.375 mV
Lead Channel Sensing Intrinsic Amplitude: 6.375 mV
Lead Channel Setting Pacing Amplitude: 2 V
Lead Channel Setting Pacing Amplitude: 2.5 V
Lead Channel Setting Pacing Pulse Width: 0.4 ms
Lead Channel Setting Sensing Sensitivity: 1.2 mV

## 2018-01-08 NOTE — Progress Notes (Signed)
Device check in clinic by industry. See scanned report for details.  (1) VT-NS episode, WC aware. Base rate increased to 60bpm (per patient/wife request), Outputs decreased to chronic settings, 2.0/2.5V. Follow up: 3 months per [facility]

## 2018-01-12 ENCOUNTER — Encounter: Payer: Medicare Other | Admitting: Cardiology

## 2018-01-25 ENCOUNTER — Telehealth: Payer: Self-pay | Admitting: *Deleted

## 2018-01-25 NOTE — Telephone Encounter (Signed)
Dr. [name] reviewed the pts 24 hour BP report and noted that his BP values were acceptable,if he had no symptoms.  Per Dr [name], if the pt had no symptoms and is still asymptomatic, then he should continue his current medication regimen.  Spoke with the pt and endorsed to him his BP report.  Per the pt, he states he is feeling much better since stopping the Losartan, and has had no issues at all, including feeling pre-syncopal or having any syncopal episodes.  Informed the pt that being he has remained asymptomatic, he should continue his current med regimen.  Pt verbalized understanding and agrees with this plan.  Will route this message to Dr [name] as a general [place].

## 2018-03-22 ENCOUNTER — Encounter: Payer: Self-pay | Admitting: Cardiology

## 2018-03-22 ENCOUNTER — Ambulatory Visit (INDEPENDENT_AMBULATORY_CARE_PROVIDER_SITE_OTHER): Payer: Medicare Other | Admitting: Cardiology

## 2018-03-22 VITALS — BP 132/82 | HR 68 | Ht 69.0 in | Wt 168.0 lb

## 2018-03-22 DIAGNOSIS — I10 Essential (primary) hypertension: Secondary | ICD-10-CM | POA: Diagnosis not present

## 2018-03-22 DIAGNOSIS — E785 Hyperlipidemia, unspecified: Secondary | ICD-10-CM

## 2018-03-22 DIAGNOSIS — I455 Other specified heart block: Secondary | ICD-10-CM | POA: Diagnosis not present

## 2018-03-22 NOTE — Patient Instructions (Addendum)
Medication Instructions:  Your physician recommends that you continue on your current medications as directed. Please refer to the Current Medication list given to you today.  *If you need a refill on your cardiac medications before your next appointment, please call your pharmacy*  Labwork: None ordered  Testing/Procedures: None ordered  Follow-Up: Remote monitoring is used to monitor your Pacemaker or ICD from home. This monitoring reduces the number of office visits required to check your device to one time per year. It allows [place] to keep an eye on the functioning of your device to ensure it is working properly. You are scheduled for a device check from home on 06/21/2018. You may send your transmission at any time that day. If you have a wireless device, the transmission will be sent automatically. After your physician reviews your transmission, you will receive a postcard with your next transmission date.  Your physician wants you to follow-up in: 9 months with Dr. Curt Bears.  You will receive a reminder letter in the mail two months in advance. If you don't receive a letter, please call our office to schedule the follow-up appointment.  Thank you for choosing [facility]!!   Trinidad Curet, RN [phone]

## 2018-03-22 NOTE — Progress Notes (Signed)
Electrophysiology Office Note   Date:  03/22/2018   ID:  Alexander [name], DOB 06/04/1940, MRN [id number]  PCP:  Alexander Infante, MD  Cardiologist:  Alexander [name]  Primary Electrophysiologist:  Alexander Meredith Leeds, MD    No chief complaint on file.    History of Present Illness: Alexander [name] is a 78 y.o. male who is being seen today for the evaluation of sinus arrest at the request of Alexander Infante, MD. Presenting today for electrophysiology evaluation.  He has a history of hypertension, hyperlipidemia, mitral valve prolapse, and syncope.  He was admitted to the hospital after episodes of syncope and was noted to have sinus arrest.  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  Today, he denies symptoms of palpitations, chest pain, shortness of breath, orthopnea, PND, lower extremity edema, claudication, dizziness, presyncope, syncope, bleeding, or neurologic sequela. The patient is tolerating medications without difficulties.    Past Medical History:  Diagnosis Date  . Hyperlipidemia   . Hypertension   . MVP (mitral valve prolapse)    mild  . Syncope    Past Surgical History:  Procedure Laterality Date  . PACEMAKER IMPLANT N/A 10/12/2017   Procedure: PACEMAKER IMPLANT;  Surgeon: Alexander Haw, MD;  Location: [facility];  Service: Cardiovascular;  Laterality: N/A;  . TONSILECTOMY, ADENOIDECTOMY, BILATERAL MYRINGOTOMY AND TUBES     as child  . TOTAL KNEE ARTHROPLASTY Left      Current Outpatient Medications  Medication Sig Dispense Refill  . ibuprofen (ADVIL,MOTRIN) 200 MG tablet Take 400 mg by mouth daily as needed for headache (pain).     . Multiple Vitamin (MULTIVITAMIN WITH MINERALS) TABS tablet Take 1 tablet by mouth daily.    . rosuvastatin (CRESTOR) 5 MG tablet TAKE ONE TABLET EACH DAY 30 tablet 10  . zolpidem (AMBIEN) 10 MG tablet TAKE ONE TABLET AT BEDTIME AS NEEDED 90 tablet 0   No current facility-administered medications for this  visit.     Allergies:   Nuts   Social History:  The patient  reports that he has never smoked. He has never used smokeless tobacco. He reports current alcohol use of about 5.0 standard drinks of alcohol per week. He reports that he does not use drugs.   Family History:  The patient's family history includes Alzheimer's disease in his mother; Hypertension in his father.    ROS:  Please see the history of present illness.   Otherwise, review of systems is positive for none.   All other systems are reviewed and negative.    PHYSICAL EXAM: VS:  BP 132/82   Pulse 68   Ht 5\' 9"  (1.753 m)   Wt 168 lb (76.2 kg)   BMI 24.81 kg/m  , BMI Body mass index is 24.81 kg/m. GEN: Well nourished, well developed, in no acute distress  HEENT: normal  Neck: no JVD, carotid bruits, or masses Cardiac: RRR; no murmurs, rubs, or gallops,no edema  Respiratory:  clear to auscultation bilaterally, normal work of breathing GI: soft, nontender, nondistended, + BS MS: no deformity or atrophy  Skin: warm and dry, pocket is well healed Neuro:  Strength and sensation are intact Psych: euthymic mood, full affect  EKG:  EKG is ordered today. Personal review of the ekg ordered shows SR, RBBB, 1dAVB  Device interrogation is reviewed today in detail.  See PaceArt for details.   Recent Labs: 10/10/2017: BUN 19; Creatinine, Ser 0.84; Hemoglobin 13.4; Platelets 210; Potassium 3.8; Sodium 141 10/12/2017: Magnesium  2.0; TSH 1.345    Lipid Panel  No results found for: CHOL, TRIG, HDL, CHOLHDL, VLDL, LDLCALC, LDLDIRECT   Wt Readings from Last 3 Encounters:  03/22/18 168 lb (76.2 kg)  01/07/18 173 lb (78.5 kg)  11/13/17 175 lb 6.4 oz (79.6 kg)      Other studies Reviewed: Additional studies/ records that were reviewed today include: TTE 10/12/17  Review of the above records today demonstrates:  - Left ventricle: The cavity size was normal. Wall thickness was   increased in a pattern of mild LVH. Systolic  function was normal.   The estimated ejection fraction was in the range of 60% to 65%.   Left ventricular diastolic function parameters were normal. - Mitral valve: Bileaflet MVP with moderate myxomatous changes and   posteriorly directed jet There was mild to moderate   regurgitation.   ASSESSMENT AND PLAN:  1.  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  Functioning appropriately.  No changes.  2.  Hypertension: Well-controlled.  No changes.  3.  Hyperlipidemia: Continue Crestor  Current medicines are reviewed at length with the patient today.   The patient does not have concerns regarding his medicines.  The following changes were made today:  none  Labs/ tests ordered today include:  Orders Placed This Encounter  Procedures  . EKG 12-Lead     Disposition:   FU with Alexander [name] 9 months  Signed, Alexander Meredith Leeds, MD  03/22/2018 11:23 AM     [facility] [address] [place] [address] [phone] (office) [phone] (fax)

## 2018-03-23 LAB — CUP PACEART INCLINIC DEVICE CHECK
Battery Remaining Longevity: 163 mo
Battery Voltage: 3.12 V
Brady Statistic AP VP Percent: 0.03 %
Brady Statistic AP VS Percent: 59.17 %
Brady Statistic AS VP Percent: 0.02 %
Brady Statistic AS VS Percent: 40.78 %
Brady Statistic RA Percent Paced: 59.62 %
Brady Statistic RV Percent Paced: 0.05 %
Date Time Interrogation Session: 20200113161626
Implantable Lead Implant Date: 20190805
Implantable Lead Implant Date: 20190805
Implantable Lead Location: 753859
Implantable Lead Location: 753860
Implantable Lead Model: 5076
Implantable Lead Model: 5076
Implantable Pulse Generator Implant Date: 20190805
Lead Channel Impedance Value: 323 Ohm
Lead Channel Impedance Value: 361 Ohm
Lead Channel Impedance Value: 380 Ohm
Lead Channel Impedance Value: 456 Ohm
Lead Channel Pacing Threshold Amplitude: 0.5 V
Lead Channel Pacing Threshold Amplitude: 0.625 V
Lead Channel Pacing Threshold Pulse Width: 0.4 ms
Lead Channel Pacing Threshold Pulse Width: 0.4 ms
Lead Channel Sensing Intrinsic Amplitude: 3.875 mV
Lead Channel Sensing Intrinsic Amplitude: 4.375 mV
Lead Channel Sensing Intrinsic Amplitude: 5 mV
Lead Channel Sensing Intrinsic Amplitude: 6.75 mV
Lead Channel Setting Pacing Amplitude: 2 V
Lead Channel Setting Pacing Amplitude: 2.5 V
Lead Channel Setting Pacing Pulse Width: 0.4 ms
Lead Channel Setting Sensing Sensitivity: 1.2 mV

## 2018-05-03 ENCOUNTER — Ambulatory Visit (HOSPITAL_COMMUNITY): Payer: Medicare Other | Attending: Cardiology

## 2018-05-03 ENCOUNTER — Ambulatory Visit (INDEPENDENT_AMBULATORY_CARE_PROVIDER_SITE_OTHER)
Admission: RE | Admit: 2018-05-03 | Discharge: 2018-05-03 | Disposition: A | Discharge status: Home / Self Care | Payer: Medicare Other | Source: Ambulatory Visit | Attending: Cardiology | Admitting: Cardiology

## 2018-05-03 ENCOUNTER — Telehealth: Payer: Self-pay

## 2018-05-03 DIAGNOSIS — I341 Nonrheumatic mitral (valve) prolapse: Secondary | ICD-10-CM | POA: Diagnosis not present

## 2018-05-03 DIAGNOSIS — I10 Essential (primary) hypertension: Secondary | ICD-10-CM | POA: Diagnosis not present

## 2018-05-03 DIAGNOSIS — R911 Solitary pulmonary nodule: Secondary | ICD-10-CM

## 2018-05-03 NOTE — Telephone Encounter (Signed)
-----   Message from [name], MD sent at 05/03/2018 11:57 AM EST ----- 1. 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 No further routine CT follow-up is required for this nodule.

## 2018-05-03 NOTE — Telephone Encounter (Signed)
Notes recorded by Frederik Schmidt, RN on 05/03/2018 at 12:17 PM EST [id number] 2/24 ------

## 2018-05-03 NOTE — Telephone Encounter (Signed)
Informed patient of CT findings per Dr [name].  He verbalized understanding.

## 2018-05-03 NOTE — Telephone Encounter (Signed)
New Message   Patient returning Michael's call about results.

## 2018-05-03 NOTE — Telephone Encounter (Signed)
Notes recorded by Frederik Schmidt, RN on 05/03/2018 at 12:47 PM EST The patient has been notified of the result and verbalized understanding. All questions (if any) were answered. Frederik Schmidt, RN 05/03/2018 12:47 PM

## 2018-05-07 ENCOUNTER — Other Ambulatory Visit: Payer: Medicare Other

## 2018-05-14 ENCOUNTER — Other Ambulatory Visit (HOSPITAL_COMMUNITY): Payer: Medicare Other

## 2018-05-24 ENCOUNTER — Other Ambulatory Visit: Payer: Self-pay

## 2018-05-24 ENCOUNTER — Encounter: Payer: Self-pay | Admitting: Cardiology

## 2018-05-24 ENCOUNTER — Ambulatory Visit (INDEPENDENT_AMBULATORY_CARE_PROVIDER_SITE_OTHER): Payer: Medicare Other | Admitting: Cardiology

## 2018-05-24 VITALS — BP 136/76 | HR 71 | Ht 69.0 in | Wt 172.4 lb

## 2018-05-24 DIAGNOSIS — Z950 Presence of cardiac pacemaker: Secondary | ICD-10-CM | POA: Diagnosis not present

## 2018-05-24 DIAGNOSIS — I341 Nonrheumatic mitral (valve) prolapse: Secondary | ICD-10-CM

## 2018-05-24 DIAGNOSIS — I10 Essential (primary) hypertension: Secondary | ICD-10-CM

## 2018-05-24 DIAGNOSIS — I340 Nonrheumatic mitral (valve) insufficiency: Secondary | ICD-10-CM

## 2018-05-24 LAB — CBC WITH DIFFERENTIAL/PLATELET
Basophils Absolute: 0 10*3/uL (ref 0.0–0.2)
Basos: 0 %
EOS (ABSOLUTE): 0.2 10*3/uL (ref 0.0–0.4)
Eos: 2 %
Hematocrit: 42.6 % (ref 37.5–51.0)
Hemoglobin: 14.4 g/dL (ref 13.0–17.7)
Immature Grans (Abs): 0 10*3/uL (ref 0.0–0.1)
Immature Granulocytes: 0 %
Lymphocytes Absolute: 2.2 10*3/uL (ref 0.7–3.1)
Lymphs: 31 %
MCH: 31.2 pg (ref 26.6–33.0)
MCHC: 33.8 g/dL (ref 31.5–35.7)
MCV: 92 fL (ref 79–97)
Monocytes Absolute: 0.9 10*3/uL (ref 0.1–0.9)
Monocytes: 13 %
Neutrophils Absolute: 3.7 10*3/uL (ref 1.4–7.0)
Neutrophils: 54 %
Platelets: 196 10*3/uL (ref 150–450)
RBC: 4.62 x10E6/uL (ref 4.14–5.80)
RDW: 12.7 % (ref 11.6–15.4)
WBC: 7.1 10*3/uL (ref 3.4–10.8)

## 2018-05-24 LAB — COMPREHENSIVE METABOLIC PANEL
ALT: 25 IU/L (ref 0–44)
AST: 32 IU/L (ref 0–40)
Albumin/Globulin Ratio: 2.3 — ABNORMAL HIGH (ref 1.2–2.2)
Albumin: 4.3 g/dL (ref 3.7–4.7)
Alkaline Phosphatase: 62 IU/L (ref 39–117)
BUN/Creatinine Ratio: 13 (ref 10–24)
BUN: 13 mg/dL (ref 8–27)
Bilirubin Total: 0.7 mg/dL (ref 0.0–1.2)
CO2: 25 mmol/L (ref 20–29)
Calcium: 9.4 mg/dL (ref 8.6–10.2)
Chloride: 104 mmol/L (ref 96–106)
Creatinine, Ser: 0.98 mg/dL (ref 0.76–1.27)
GFR calc Af Amer: 84 mL/min/{1.73_m2} (ref >59)
GFR calc non Af Amer: 73 mL/min/{1.73_m2} (ref >59)
Globulin, Total: 1.9 g/dL (ref 1.5–4.5)
Glucose: 90 mg/dL (ref 65–99)
Potassium: 4.1 mmol/L (ref 3.5–5.2)
Sodium: 145 mmol/L — ABNORMAL HIGH (ref 134–144)
Total Protein: 6.2 g/dL (ref 6.0–8.5)

## 2018-05-24 LAB — LIPID PANEL
Chol/HDL Ratio: 1.9 ratio (ref 0.0–5.0)
Cholesterol, Total: 165 mg/dL (ref 100–199)
HDL: 85 mg/dL (ref >39)
LDL Calculated: 65 mg/dL (ref 0–99)
Triglycerides: 77 mg/dL (ref 0–149)
VLDL Cholesterol Cal: 15 mg/dL (ref 5–40)

## 2018-05-24 LAB — TSH: TSH: 2.78 u[IU]/mL (ref 0.450–4.500)

## 2018-05-24 NOTE — Patient Instructions (Signed)
Medication Instructions:  Your provider recommends that you continue on your current medications as directed. Please refer to the Current Medication list given to you today.   If you need a refill on your cardiac medications before your next appointment, please call your pharmacy.   Lab work: TODAY: CMET, Lipids, CBC, TSH If you have labs (blood work) drawn today and your tests are completely normal, you will receive your results only by: Marland Kitchen MyChart Message (if you have MyChart) OR . A paper copy in the mail If you have any lab test that is abnormal or we need to change your treatment, we will call you to review the results.  Testing/Procedures: Your provider has requested that you have an echocardiogram 6 months prior to your office visit. Echocardiography is a painless test that uses sound waves to create images of your heart. It provides your doctor with information about the size and shape of your heart and how well your heart's chambers and valves are working. This procedure takes approximately one hour. There are no restrictions for this procedure.  Follow-Up: At [facility], you and your health needs are our priority.  As part of our continuing mission to provide you with exceptional heart care, we have created designated Provider Care Teams.  These Care Teams include your primary Cardiologist (physician) and Advanced Practice Providers (APPs -  Physician Assistants and Nurse Practitioners) who all work together to provide you with the care you need, when you need it. You will need a follow up appointment in 6 months.  Please call our office 2 months in advance to schedule this appointment.  You may see [name], MD or one of the following Advanced Practice Providers on your designated Care Team:   [place], PA-C Melina Copa, PA-C . Ermalinda Barrios, PA-C

## 2018-05-24 NOTE — Progress Notes (Signed)
Cardiology Office Note    Date:  05/24/2018   ID:  BECKY BERBERIAN, DOB [birth year], MRN [id number]  PCP:  [name], MD  Cardiologist:  [name], MD   Chief complain: Follow up for MVP, HLP  History of Present Illness:  Alexander [name] is a 78 y.o. 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 The patient has been followed by Dr. Mare Ferrari for years. He does avid cardio exercise 3 times a week, plays golf, travels. He has retired in April 2019.  I saw him in June 2019 and he was doing great.  He presented to the hospital on October 11, 2017 with 3 episodes of syncope, he was diagnosed with sinus arrest and 15-second pause and he was implanted MDT dual chamber PPM on 10/12/17 by Dr Curt Bears.  There were no immediate post procedure complications.  He is coming after many months, he states that he feels great, he is just tired from not being able to exercise, he just started using a treadmill yesterday, he was instructed to not to use his left arm for sports for 6 weeks post implant, he denies any fever or chills, no discharge from the implantation site, otherwise completely asymptomatic.  He had an echocardiogram performed in the hospital that showed moderate mitral regurgitation.  01/07/2018 -the patient is coming for an extra visit after 3 presyncopal episodes in 1 day not associated with full syncope or fall, they happen while he was walking around the house and every time he stood up and just did not feel well, they resolved after several minutes while he was sitting down.  He recently visited [place] was hiking and feeling well.  As a result of his symptoms he underwent pacemaker interrogation on Tuesday of this week with findings of normally functioning pacemaker no pauses no pacemaker failure.  05/24/2018 -this is 3 months follow-up, the patient is doing great, he has no  problem with his pacemaker, no fever chills or erythema in in the pacemaker pocket.  He continues to play golf 3-4 times a week without any limitations, denies any orthopnea proximal nocturnal dyspnea or dyspnea on exertion.  No lower extremity edema.  He tolerates Crestor well.  Past Medical History:  Diagnosis Date  . Hyperlipidemia   . Hypertension   . MVP (mitral valve prolapse)    mild  . Syncope    Past Surgical History:  Procedure Laterality Date  . PACEMAKER IMPLANT N/A 10/12/2017   Procedure: PACEMAKER IMPLANT;  Surgeon: Constance Haw, MD;  Location: [facility];  Service: Cardiovascular;  Laterality: N/A;  . TONSILECTOMY, ADENOIDECTOMY, BILATERAL MYRINGOTOMY AND TUBES     as child  . TOTAL KNEE ARTHROPLASTY Left    Current Medications: Outpatient Medications Prior to Visit  Medication Sig Dispense Refill  . ibuprofen (ADVIL,MOTRIN) 200 MG tablet Take 400 mg by mouth daily as needed for headache (pain).     . Multiple Vitamin (MULTIVITAMIN WITH MINERALS) TABS tablet Take 1 tablet by mouth daily.    . rosuvastatin (CRESTOR) 5 MG tablet TAKE ONE TABLET EACH DAY 30 tablet 10  . zolpidem (AMBIEN) 10 MG tablet TAKE ONE TABLET AT BEDTIME AS NEEDED 90 tablet 0   No facility-administered medications prior to visit.     Allergies:   Nuts   Social History   Socioeconomic History  . Marital status: Married    Spouse name: Not  on file  . Number of children: Not on file  . Years of education: Not on file  . Highest education level: Not on file  Occupational History  . Not on file  Social Needs  . Financial resource strain: Not on file  . Food insecurity:    Worry: Not on file    Inability: Not on file  . Transportation needs:    Medical: Not on file    Non-medical: Not on file  Tobacco Use  . Smoking status: Never Smoker  . Smokeless tobacco: Never Used  Substance and Sexual Activity  . Alcohol use: Yes    Alcohol/week: 5.0 standard drinks    Types: 5  Glasses of wine per week  . Drug use: No  . Sexual activity: Not on file  Lifestyle  . Physical activity:    Days per week: Not on file    Minutes per session: Not on file  . Stress: Not on file  Relationships  . Social connections:    Talks on phone: Not on file    Gets together: Not on file    Attends religious service: Not on file    Active member of club or organization: Not on file    Attends meetings of clubs or organizations: Not on file    Relationship status: Not on file  Other Topics Concern  . Not on file  Social History Narrative  . Not on file    Family History:  The patient's family history includes Alzheimer's disease in his mother; Hypertension in his father.   ROS:   Please see the history of present illness.    ROS All other systems reviewed and are negative.  PHYSICAL EXAM:   VS:  BP 136/76   Pulse 71   Ht 5\' 9"  (1.753 m)   Wt 172 lb 6.4 oz (78.2 kg)   SpO2 95%   BMI 25.46 kg/m    GEN: Well nourished, well developed, in no acute distress  HEENT: normal  Neck: no JVD, carotid bruits, or masses Cardiac: RRR; 2 out of 6 mid to end murmurs, rubs, or gallops,no edema  Respiratory:  clear to auscultation bilaterally, normal work of breathing GI: soft, nontender, nondistended, + BS MS: no deformity or atrophy  Skin: warm and dry, no rash Neuro:  Alert and Oriented x 3, Strength and sensation are intact Psych: euthymic mood, full affect  Wt Readings from Last 3 Encounters:  05/24/18 172 lb 6.4 oz (78.2 kg)  03/22/18 168 lb (76.2 kg)  01/07/18 173 lb (78.5 kg)    Studies/Labs Reviewed:   EKG:  EKG is ordered today.  It shows atrial paced rhythm, right bundle branch block, unchanged from prior.  This was personally reviewed.  Recent Labs: 10/10/2017: BUN 19; Creatinine, Ser 0.84; Hemoglobin 13.4; Platelets 210; Potassium 3.8; Sodium 141 10/12/2017: Magnesium 2.0; TSH 1.345   Lipid Panel No results found for: CHOL, TRIG, HDL, CHOLHDL, VLDL, LDLCALC,  LDLDIRECT  Additional studies/ records that were reviewed today include:   TTE: 10/12/2017 - Left ventricle: The cavity size was normal. Wall thickness was   increased in a pattern of mild LVH. Systolic function was normal.   The estimated ejection fraction was in the range of 60% to 65%.   Left ventricular diastolic function parameters were normal. - Mitral valve: Bileaflet MVP with moderate myxomatous changes and   posteriorly directed jet There was mild to moderate   regurgitation.   ASSESSMENT:    1. Essential hypertension  2. Mitral valve insufficiency, unspecified etiology   3. MVP (mitral valve prolapse)   4. Cardiac pacemaker in situ     PLAN:  In order of problems listed above:  1. Recurrent syncope and presyncope, seems to be related to orthostatic hypotension's, this has resolved. 2. Status post pacemaker implantation for sinus arrest, pacemaker interrogated on Tuesday and functioning well.   3. MVP, his most recent echocardiogram shows bilateral prolapse, leaflets are clearly myxomatous, there is no obvious calcification, he is jet is posteriorly directed and degree of regurgitation appears moderate possibly moderate to severe.  His MR is still not severely he is completely asymptomatic, we will follow closely, repeat echo again in August 2020 and see the patient in September.   4. He has normal size of the left atrium and normal right-sided pressure.  I would repeat his echo in 3 months, once his MR appears severe we will arrange for TEE and repair by Dr. [name] when via minithoracotomy 5. Hypertension -well-controlled. Hyperlipidemia, on Crestor 5 mg daily, we will check lipids and liver enzymes today.    6. Coronary calcium score of 94. This was 51 percentile for age and sex matched control.  He is already on Crestor, I would continue the same management given low percentile for age and sex high level of exercise and good diet.  6.  Ascending aortic diameter of 39 mm,  he is explained that this is still within normal limits, we will recheck on his echocardiogram next year.  Follow-up in 6 months, with echocardiogram prior to that visit.  Obtain labs today.  Medication Adjustments/Labs and Tests Ordered: Current medicines are reviewed at length with the patient today.  Concerns regarding medicines are outlined above.  Medication changes, Labs and Tests ordered today are listed in the Patient Instructions below. Patient Instructions  Medication Instructions:  Your provider recommends that you continue on your current medications as directed. Please refer to the Current Medication list given to you today.   If you need a refill on your cardiac medications before your next appointment, please call your pharmacy.   Lab work: TODAY: CMET, Lipids, CBC, TSH If you have labs (blood work) drawn today and your tests are completely normal, you will receive your results only by: Marland Kitchen MyChart Message (if you have MyChart) OR . A paper copy in the mail If you have any lab test that is abnormal or we need to change your treatment, we will call you to review the results.  Testing/Procedures: Your provider has requested that you have an echocardiogram 6 months prior to your office visit. Echocardiography is a painless test that uses sound waves to create images of your heart. It provides your doctor with information about the size and shape of your heart and how well your heart's chambers and valves are working. This procedure takes approximately one hour. There are no restrictions for this procedure.  Follow-Up: At [facility], you and your health needs are our priority.  As part of our continuing mission to provide you with exceptional heart care, we have created designated Provider Care Teams.  These Care Teams include your primary Cardiologist (physician) and Advanced Practice Providers (APPs -  Physician Assistants and Nurse Practitioners) who all work together to  provide you with the care you need, when you need it. You will need a follow up appointment in 6 months.  Please call our office 2 months in advance to schedule this appointment.  You may see [name], MD  or one of the following Advanced Practice Providers on your designated Care Team:   Lyda Jester, PA-C Melina Copa, PA-C . Ermalinda Barrios, PA-C      Signed, [name], MD  05/24/2018 8:40 AM    [address], [place],   [address] Phone: [phone]; Fax: [phone]

## 2018-05-25 ENCOUNTER — Telehealth: Payer: Self-pay

## 2018-05-25 NOTE — Telephone Encounter (Signed)
-----   Message from Nuala Alpha, LPN sent at 05/08/3141  9:25 AM EDT -----  ----- Message ----- From: [name], MD Sent: 05/25/2018   9:19 AM EDT To: Nuala Alpha, LPN  Normal labs including CMP, CBC, TSH, excellent lipids

## 2018-06-21 ENCOUNTER — Ambulatory Visit (INDEPENDENT_AMBULATORY_CARE_PROVIDER_SITE_OTHER): Payer: Medicare Other | Admitting: *Deleted

## 2018-06-21 ENCOUNTER — Other Ambulatory Visit: Payer: Self-pay

## 2018-06-21 DIAGNOSIS — R55 Syncope and collapse: Secondary | ICD-10-CM | POA: Diagnosis not present

## 2018-06-21 DIAGNOSIS — I455 Other specified heart block: Secondary | ICD-10-CM

## 2018-06-21 LAB — CUP PACEART REMOTE DEVICE CHECK
Battery Remaining Longevity: 159 mo
Battery Voltage: 3.08 V
Brady Statistic AP VP Percent: 0.03 %
Brady Statistic AP VS Percent: 50.79 %
Brady Statistic AS VP Percent: 0.02 %
Brady Statistic AS VS Percent: 49.17 %
Brady Statistic RA Percent Paced: 51.17 %
Brady Statistic RV Percent Paced: 0.05 %
Date Time Interrogation Session: 20200412234834
Implantable Lead Implant Date: 20190805
Implantable Lead Implant Date: 20190805
Implantable Lead Location: 753859
Implantable Lead Location: 753860
Implantable Lead Model: 5076
Implantable Lead Model: 5076
Implantable Pulse Generator Implant Date: 20190805
Lead Channel Impedance Value: 323 Ohm
Lead Channel Impedance Value: 342 Ohm
Lead Channel Impedance Value: 380 Ohm
Lead Channel Impedance Value: 418 Ohm
Lead Channel Pacing Threshold Amplitude: 0.625 V
Lead Channel Pacing Threshold Amplitude: 0.75 V
Lead Channel Pacing Threshold Pulse Width: 0.4 ms
Lead Channel Pacing Threshold Pulse Width: 0.4 ms
Lead Channel Sensing Intrinsic Amplitude: 4.125 mV
Lead Channel Sensing Intrinsic Amplitude: 4.125 mV
Lead Channel Sensing Intrinsic Amplitude: 5.125 mV
Lead Channel Sensing Intrinsic Amplitude: 5.125 mV
Lead Channel Setting Pacing Amplitude: 2 V
Lead Channel Setting Pacing Amplitude: 2.5 V
Lead Channel Setting Pacing Pulse Width: 0.4 ms
Lead Channel Setting Sensing Sensitivity: 1.2 mV

## 2018-07-02 NOTE — Progress Notes (Signed)
Remote pacemaker transmission.   

## 2018-07-07 DIAGNOSIS — R82998 Other abnormal findings in urine: Secondary | ICD-10-CM | POA: Diagnosis not present

## 2018-07-07 DIAGNOSIS — I10 Essential (primary) hypertension: Secondary | ICD-10-CM | POA: Diagnosis not present

## 2018-07-07 DIAGNOSIS — Z125 Encounter for screening for malignant neoplasm of prostate: Secondary | ICD-10-CM | POA: Diagnosis not present

## 2018-07-07 DIAGNOSIS — E7849 Other hyperlipidemia: Secondary | ICD-10-CM | POA: Diagnosis not present

## 2018-07-14 DIAGNOSIS — Z0000 Encounter for general adult medical examination without abnormal findings: Secondary | ICD-10-CM | POA: Diagnosis not present

## 2018-07-14 DIAGNOSIS — I10 Essential (primary) hypertension: Secondary | ICD-10-CM | POA: Diagnosis not present

## 2018-07-14 DIAGNOSIS — I4510 Unspecified right bundle-branch block: Secondary | ICD-10-CM | POA: Diagnosis not present

## 2018-07-14 DIAGNOSIS — R918 Other nonspecific abnormal finding of lung field: Secondary | ICD-10-CM | POA: Diagnosis not present

## 2018-07-14 DIAGNOSIS — M1990 Unspecified osteoarthritis, unspecified site: Secondary | ICD-10-CM | POA: Diagnosis not present

## 2018-07-14 DIAGNOSIS — R55 Syncope and collapse: Secondary | ICD-10-CM | POA: Diagnosis not present

## 2018-07-14 DIAGNOSIS — Z1331 Encounter for screening for depression: Secondary | ICD-10-CM | POA: Diagnosis not present

## 2018-07-14 DIAGNOSIS — E785 Hyperlipidemia, unspecified: Secondary | ICD-10-CM | POA: Diagnosis not present

## 2018-07-14 DIAGNOSIS — I700 Atherosclerosis of aorta: Secondary | ICD-10-CM | POA: Diagnosis not present

## 2018-07-14 DIAGNOSIS — H6123 Impacted cerumen, bilateral: Secondary | ICD-10-CM | POA: Diagnosis not present

## 2018-07-14 DIAGNOSIS — I77810 Thoracic aortic ectasia: Secondary | ICD-10-CM | POA: Diagnosis not present

## 2018-07-14 DIAGNOSIS — I341 Nonrheumatic mitral (valve) prolapse: Secondary | ICD-10-CM | POA: Diagnosis not present

## 2018-08-11 ENCOUNTER — Telehealth: Payer: Self-pay | Admitting: Cardiology

## 2018-08-11 ENCOUNTER — Other Ambulatory Visit (HOSPITAL_COMMUNITY): Payer: Self-pay | Admitting: *Deleted

## 2018-08-11 NOTE — Telephone Encounter (Signed)
Patient called and stated that today before lunch time he felt dizzy, nauseated and a little confused, and he smelt something. He stated that he wants to know if there were any episodes on his device. Instructed pt to send a manual transmission. Helped him send the transmission. Call him back at [phone]. Transmission received.

## 2018-08-11 NOTE — Telephone Encounter (Signed)
Spoke with patient. He is feeling better now. No further symptoms. Pt denies stroke like symptoms or loss of consciousness. He denies unilateral weakness or slurred speech. He states his "confusion" was more about what was causing his symptoms, rather than "being confused".   He took his BP and HR afterward and reports they were stable.  Pt knows to call back with any further symptom or if he needs to send another transmission. All questions answered.    Alexander [name] [address] [place], PA-C 08/11/2018 1:31 PM

## 2018-09-20 ENCOUNTER — Ambulatory Visit (INDEPENDENT_AMBULATORY_CARE_PROVIDER_SITE_OTHER): Payer: Medicare Other | Admitting: *Deleted

## 2018-09-20 DIAGNOSIS — I4510 Unspecified right bundle-branch block: Secondary | ICD-10-CM

## 2018-09-20 DIAGNOSIS — I455 Other specified heart block: Secondary | ICD-10-CM

## 2018-09-21 LAB — CUP PACEART REMOTE DEVICE CHECK
Battery Remaining Longevity: 154 mo
Battery Voltage: 3.05 V
Brady Statistic AP VP Percent: 0.03 %
Brady Statistic AP VS Percent: 55.72 %
Brady Statistic AS VP Percent: 0.02 %
Brady Statistic AS VS Percent: 44.23 %
Brady Statistic RA Percent Paced: 56.15 %
Brady Statistic RV Percent Paced: 0.04 %
Date Time Interrogation Session: 20200714081653
Implantable Lead Implant Date: 20190805
Implantable Lead Implant Date: 20190805
Implantable Lead Location: 753859
Implantable Lead Location: 753860
Implantable Lead Model: 5076
Implantable Lead Model: 5076
Implantable Pulse Generator Implant Date: 20190805
Lead Channel Impedance Value: 304 Ohm
Lead Channel Impedance Value: 342 Ohm
Lead Channel Impedance Value: 361 Ohm
Lead Channel Impedance Value: 418 Ohm
Lead Channel Pacing Threshold Amplitude: 0.625 V
Lead Channel Pacing Threshold Amplitude: 0.75 V
Lead Channel Pacing Threshold Pulse Width: 0.4 ms
Lead Channel Pacing Threshold Pulse Width: 0.4 ms
Lead Channel Sensing Intrinsic Amplitude: 3.25 mV
Lead Channel Sensing Intrinsic Amplitude: 3.25 mV
Lead Channel Sensing Intrinsic Amplitude: 4.625 mV
Lead Channel Sensing Intrinsic Amplitude: 4.625 mV
Lead Channel Setting Pacing Amplitude: 2 V
Lead Channel Setting Pacing Amplitude: 2.5 V
Lead Channel Setting Pacing Pulse Width: 0.4 ms
Lead Channel Setting Sensing Sensitivity: 1.2 mV

## 2018-10-01 NOTE — Progress Notes (Signed)
Remote pacemaker transmission.   

## 2018-10-28 DIAGNOSIS — Z23 Encounter for immunization: Secondary | ICD-10-CM | POA: Diagnosis not present

## 2018-10-29 ENCOUNTER — Telehealth: Payer: Self-pay | Admitting: Cardiology

## 2018-10-29 NOTE — Telephone Encounter (Signed)
New message    Patient states he is returning a call from M.Dapp, RN. Request to discuss CT results and plan of care

## 2018-10-29 NOTE — Telephone Encounter (Signed)
Reviewed CT results from 05/03/18 with patient. He verbalized understanding.

## 2018-11-08 DIAGNOSIS — H903 Sensorineural hearing loss, bilateral: Secondary | ICD-10-CM | POA: Diagnosis not present

## 2018-11-08 DIAGNOSIS — H6123 Impacted cerumen, bilateral: Secondary | ICD-10-CM | POA: Diagnosis not present

## 2018-11-08 DIAGNOSIS — Z974 Presence of external hearing-aid: Secondary | ICD-10-CM | POA: Diagnosis not present

## 2018-11-22 ENCOUNTER — Telehealth: Payer: Self-pay | Admitting: Cardiology

## 2018-11-22 NOTE — Telephone Encounter (Signed)
Pt states he had an episode and felt weird. I had him send a manual transmission. I let him talk with the device nurse to review his transmission.

## 2018-11-22 NOTE — Telephone Encounter (Signed)
Pt is calling into the office today, to say over the weekend on Saturday around 11 am, he had an episode of dizziness and was concerned it could've been related to his pacemaker. Pt states he uses the pacemaker remote check APP from his mobile device, and his cell was near the device, and was inquiring if this picked up anything on our end.  Pt states it lasted only a few minutes, then went away, but given his history, he was quite concerned.  Informed the pt that I will call our device team right now and see if they were able to pick up anything on their end, and call him right back.  Also went ahead and moved his appt with Dr [name] from 10/6 to tomorrow 9/15 at 1040, for he was originally due to be seen in September, and to have him thoroughly assessed, given his reported weekend event. Pt agreed to this appt time for tomorrow.    Spoke with our device team, and unfortunately they are unable to see anything from this weekend with his pacemaker, given he uses the mobile APP, and would manually need to send that transmission in.  Did inform the pt that being he is coming in to see Dr. [name] tomorrow, the device RN advised that he go ahead and send in a manual transmission now, so that we can have this available for review at his visit tomorrow.  Device team will be calling him now to provide him step-by-step instruction on how to send this through his mobile device APP.  Pt verbalized understanding and agrees with this plan. Pt was more than gracious for all the assistance provided.  Will send this message to Dr [name] as an Juluis Rainier, to make her aware that he will be coming into the office to see her tomorrow.

## 2018-11-22 NOTE — Telephone Encounter (Signed)
Pt called and wanted to speak to Karlene Einstein  About a personal matter. He would like Ivy to call him back as soon as she can

## 2018-11-22 NOTE — Progress Notes (Signed)
Cardiology Office Note    Date:  11/23/2018   ID:  Alexander [name], DOB 05/28/1940, MRN [id number]  PCP:  [name], MD  Cardiologist:  [name], MD   Chief complain: Dizziness  History of Present Illness:  Alexander [name] is a 78 y.o. 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 The patient has been followed by Dr. Mare Ferrari for years. He does avid cardio exercise 3 times a week, plays golf, travels. He has retired in April 2019.  I saw him in June 2019 and he was doing great.  He presented to the hospital on October 11, 2017 with 3 episodes of syncope, he was diagnosed with sinus arrest and 15-second pause and he was implanted MDT dual chamber PPM on 10/12/17 by Dr Curt Bears.  There were no immediate post procedure complications.  He is coming after many months, he states that he feels great, he is just tired from not being able to exercise, he just started using a treadmill yesterday, he was instructed to not to use his left arm for sports for 6 weeks post implant, he denies any fever or chills, no discharge from the implantation site, otherwise completely asymptomatic.  He had an echocardiogram performed in the hospital that showed moderate mitral regurgitation.  01/07/2018 -the patient is coming for an extra visit after 3 presyncopal episodes in 1 day not associated with full syncope or fall, they happen while he was walking around the house and every time he stood up and just did not feel well, they resolved after several minutes while he was sitting down.  He recently visited [place] was hiking and feeling well.  As a result of his symptoms he underwent pacemaker interrogation on Tuesday of this week with findings of normally functioning pacemaker no pauses no pacemaker failure.  05/24/2018 -this is 3 months follow-up, the patient is doing great, he has no problem with  his pacemaker, no fever chills or erythema in in the pacemaker pocket.  He continues to play golf 3-4 times a week without any limitations, denies any orthopnea proximal nocturnal dyspnea or dyspnea on exertion.  No lower extremity edema.  He tolerates Crestor well.  11/23/2018 - the patient is coming after 6 months, he continues to have episodes of dizziness, he had two episode on Saturday 11/20/2018 but no events was recorded, transient events reported did not coincide with events on transmissions. He felt unusual with no CP, SOB, syncope, or palpitations.  He has not had any episodes for months prior to that and none since Saturday.  He states that he is not very good with hydration or eating breakfast, these have been late morning, he continues to be very acting and plays golf several times a week, he has no chest pain shortness of breath while playing but frequently experiences slight dizziness when he gets up while golfing.  Review obtain orthostatic vitals today, laying down blood pressure 140/82, heart rate 52, sitting up heart rate 60 blood pressure 160/84, standing up blood pressure 156/86, heart rate 66, standing up after 5 minutes blood pressure 156/88, heart rate 62.  No symptoms.   Past Medical History:  Diagnosis Date  . Hyperlipidemia   . Hypertension   . MVP (mitral valve prolapse)    mild  . Syncope    Past Surgical History:  Procedure Laterality Date  . PACEMAKER IMPLANT N/A 10/12/2017   Procedure: PACEMAKER  IMPLANT;  Surgeon: Constance Haw, MD;  Location: [facility];  Service: Cardiovascular;  Laterality: N/A;  . TONSILECTOMY, ADENOIDECTOMY, BILATERAL MYRINGOTOMY AND TUBES     as child  . TOTAL KNEE ARTHROPLASTY Left    Current Medications: Outpatient Medications Prior to Visit  Medication Sig Dispense Refill  . ibuprofen (ADVIL,MOTRIN) 200 MG tablet Take 400 mg by mouth daily as needed for headache (pain).     . Multiple Vitamin (MULTIVITAMIN WITH MINERALS)  TABS tablet Take 1 tablet by mouth daily.    . rosuvastatin (CRESTOR) 5 MG tablet TAKE ONE TABLET EACH DAY 30 tablet 10  . zolpidem (AMBIEN) 10 MG tablet TAKE ONE TABLET AT BEDTIME AS NEEDED 90 tablet 0   No facility-administered medications prior to visit.     Allergies:   Nuts   Social History   Socioeconomic History  . Marital status: Married    Spouse name: Not on file  . Number of children: Not on file  . Years of education: Not on file  . Highest education level: Not on file  Occupational History  . Not on file  Social Needs  . Financial resource strain: Not on file  . Food insecurity    Worry: Not on file    Inability: Not on file  . Transportation needs    Medical: Not on file    Non-medical: Not on file  Tobacco Use  . Smoking status: Never Smoker  . Smokeless tobacco: Never Used  Substance and Sexual Activity  . Alcohol use: Yes    Alcohol/week: 5.0 standard drinks    Types: 5 Glasses of wine per week  . Drug use: No  . Sexual activity: Not on file  Lifestyle  . Physical activity    Days per week: Not on file    Minutes per session: Not on file  . Stress: Not on file  Relationships  . Social Herbalist on phone: Not on file    Gets together: Not on file    Attends religious service: Not on file    Active member of club or organization: Not on file    Attends meetings of clubs or organizations: Not on file    Relationship status: Not on file  Other Topics Concern  . Not on file  Social History Narrative  . Not on file    Family History:  The patient's family history includes Alzheimer's disease in his mother; Hypertension in his father.   ROS:   Please see the history of present illness.    ROS All other systems reviewed and are negative.  PHYSICAL EXAM:   VS:  BP 132/82   Pulse 71   Ht 5\' 10"  (1.778 m)   Wt 174 lb 1.9 oz (79 kg)   SpO2 96%   BMI 24.98 kg/m    GEN: Well nourished, well developed, in no acute distress  HEENT:  normal  Neck: no JVD, carotid bruits, or masses Cardiac: RRR; 4 out of 6 mid to end murmurs, rubs, or gallops,no edema  Respiratory:  clear to auscultation bilaterally, normal work of breathing GI: soft, nontender, nondistended, + BS MS: no deformity or atrophy  Skin: warm and dry, no rash Neuro:  Alert and Oriented x 3, Strength and sensation are intact Psych: euthymic mood, full affect  Wt Readings from Last 3 Encounters:  11/23/18 174 lb 1.9 oz (79 kg)  05/24/18 172 lb 6.4 oz (78.2 kg)  03/22/18 168 lb (76.2  kg)    Studies/Labs Reviewed:   EKG:  EKG is ordered today.  It shows atrial paced rhythm, right bundle branch block, unchanged from prior.  This was personally reviewed.  Recent Labs: 05/24/2018: ALT 25; BUN 13; Creatinine, Ser 0.98; Hemoglobin 14.4; Platelets 196; Potassium 4.1; Sodium 145; TSH 2.780   Lipid Panel    Component Value Date/Time   CHOL 165 05/24/2018 0835   TRIG 77 05/24/2018 0835   HDL 85 05/24/2018 0835   CHOLHDL 1.9 05/24/2018 0835   LDLCALC 65 05/24/2018 0835    Additional studies/ records that were reviewed today include:   TTE: 10/12/2017 - Left ventricle: The cavity size was normal. Wall thickness was   increased in a pattern of mild LVH. Systolic function was normal.   The estimated ejection fraction was in the range of 60% to 65%.   Left ventricular diastolic function parameters were normal. - Mitral valve: Bileaflet MVP with moderate myxomatous changes and   posteriorly directed jet There was mild to moderate   regurgitation.   ASSESSMENT:    1. Right bundle branch block   2. Syncope, unspecified syncope type   3. MVP (mitral valve prolapse)     PLAN:  In order of problems listed above:  1. Recurrent presyncope, pacemaker interrogation did not show any episodes that could be responsible for his symptoms, he is orthostatic vital signs are normal.  He is encouraged to increase his level of hydration and eat small amount of fluid  through side the day to avoid hypoglycemia.  I am going to obtain carotid ultrasound as he does not have any on file.  Otherwise would not proceed with any other work-up such as brain MRI at this point unless this is recurrent event. 2. Status post pacemaker implantation for sinus arrest, pacemaker interrogated on Tuesday and functioning well.  EKG today shows atrial paced rhythm with right bundle branch block, unchanged from prior. 3. MVP, his most recent echocardiogram shows bilateral prolapse, leaflets are clearly myxomatous, there is no obvious calcification, he is jet is posteriorly directed and degree of regurgitation appears moderate possibly moderate to severe.  His last echo was done in January 2020, he is murmur appears slightly worse today however he is completely asymptomatic has the same functional capacity and no shortness of breath, will reevaluate echo prior to the next visit in 6 months.  He has normal size of the left atrium and normal right-sided pressure. once his MR appears severe we will arrange for TEE and repair by Dr. [name] when via minithoracotomy. 4. Hypertension -well-controlled. 5. Hyperlipidemia, on Crestor 5 mg daily, that is well-tolerated, will continue the same management and check labs prior to the next visit. 6. Coronary calcium score of 94. This was 34 percentile for age and sex matched control.  He is already on Crestor, I would continue the same management given low percentile for age and sex high level of exercise and good diet.  7.  Ascending aortic diameter of 39 mm, he is explained that this is still within normal limits, we will recheck on his echocardiogram next year.  Follow-up in 6 months, with echocardiogram prior to that visit.  Obtain labs today.  Medication Adjustments/Labs and Tests Ordered: Current medicines are reviewed at length with the patient today.  Concerns regarding medicines are outlined above.  Medication changes, Labs and Tests ordered today  are listed in the Patient Instructions below. Patient Instructions  Medication Instructions:  Your physician recommends that you continue on  your current medications as directed. Please refer to the Current Medication list given to you today.  If you need a refill on your cardiac medications before your next appointment, please call your pharmacy.   Lab work: Your physician recommends that you return for lab work in: [place]... CBC, CMET, TSH, LIPIDS  If you have labs (blood work) drawn today and your tests are completely normal, you will receive your results only by: Marland Kitchen MyChart Message (if you have MyChart) OR . A paper copy in the mail If you have any lab test that is abnormal or we need to change your treatment, we will call you to review the results.  Testing/Procedures: IN 6 MONTHS WITH LABWORK  Your physician has requested that you have an echocardiogram. Echocardiography is a painless test that uses sound waves to create images of your heart. It provides your doctor with information about the size and shape of your heart and how well your heart's chambers and valves are working. This procedure takes approximately one hour. There are no restrictions for this procedure.  Your physician has requested that you have a carotid duplex. This test is an ultrasound of the carotid arteries in your neck. It looks at blood flow through these arteries that supply the brain with blood. Allow one hour for this exam. There are no restrictions or special instructions.      Follow-Up: At [facility], you and your health needs are our priority.  As part of our continuing mission to provide you with exceptional heart care, we have created designated Provider Care Teams.  These Care Teams include your primary Cardiologist (physician) and Advanced Practice Providers (APPs -  Physician Assistants and Nurse Practitioners) who all work together to provide you with the care you need, when you need it. You  will need a follow up appointment in 6 months.  Please call our office 2 months in advance to schedule this appointment.  You may see [name], MD or one of the following Advanced Practice Providers on your designated Care Team:   [place], PA-C Melina Copa, PA-C . Ermalinda Barrios, PA-C  Any Other Special Instructions Will Be Listed Below (If Applicable).       Signed, [name], MD  11/23/2018 12:09 PM    [address], [place], [place]  [address] Phone: [phone]; Fax: [phone]

## 2018-11-22 NOTE — Telephone Encounter (Signed)
I Let the pt talk with Jenny Reichmann, RN

## 2018-11-22 NOTE — Telephone Encounter (Signed)
Spoke with patient. Remote transmission sent and reviewed. No events recorded on 11/20/18 when felt unusual with no CP, SOB, syncope, or palpitations. Transient events reported did not coincide with events on transmissions. F/U appointment with Dr [name] scheduled for 11/23/2018.

## 2018-11-23 ENCOUNTER — Ambulatory Visit (INDEPENDENT_AMBULATORY_CARE_PROVIDER_SITE_OTHER): Payer: Medicare Other | Admitting: Cardiology

## 2018-11-23 ENCOUNTER — Encounter: Payer: Self-pay | Admitting: Cardiology

## 2018-11-23 ENCOUNTER — Other Ambulatory Visit: Payer: Self-pay

## 2018-11-23 ENCOUNTER — Ambulatory Visit (HOSPITAL_COMMUNITY)
Admission: RE | Admit: 2018-11-23 | Discharge: 2018-11-23 | Disposition: A | Discharge status: Home / Self Care | Payer: Medicare Other | Source: Ambulatory Visit | Attending: Cardiovascular Disease | Admitting: Cardiovascular Disease

## 2018-11-23 VITALS — BP 132/82 | HR 71 | Ht 70.0 in | Wt 174.1 lb

## 2018-11-23 DIAGNOSIS — R55 Syncope and collapse: Secondary | ICD-10-CM | POA: Diagnosis not present

## 2018-11-23 DIAGNOSIS — I10 Essential (primary) hypertension: Secondary | ICD-10-CM

## 2018-11-23 DIAGNOSIS — I4510 Unspecified right bundle-branch block: Secondary | ICD-10-CM | POA: Diagnosis not present

## 2018-11-23 DIAGNOSIS — I340 Nonrheumatic mitral (valve) insufficiency: Secondary | ICD-10-CM | POA: Diagnosis not present

## 2018-11-23 DIAGNOSIS — I341 Nonrheumatic mitral (valve) prolapse: Secondary | ICD-10-CM | POA: Diagnosis not present

## 2018-11-23 DIAGNOSIS — Z950 Presence of cardiac pacemaker: Secondary | ICD-10-CM

## 2018-11-23 DIAGNOSIS — E785 Hyperlipidemia, unspecified: Secondary | ICD-10-CM

## 2018-11-23 DIAGNOSIS — E7849 Other hyperlipidemia: Secondary | ICD-10-CM

## 2018-11-23 NOTE — Patient Instructions (Signed)
Medication Instructions:  Your physician recommends that you continue on your current medications as directed. Please refer to the Current Medication list given to you today.  If you need a refill on your cardiac medications before your next appointment, please call your pharmacy.   Lab work: Your physician recommends that you return for lab work in: [place]... CBC, CMET, TSH, LIPIDS  If you have labs (blood work) drawn today and your tests are completely normal, you will receive your results only by: Marland Kitchen MyChart Message (if you have MyChart) OR . A paper copy in the mail If you have any lab test that is abnormal or we need to change your treatment, we will call you to review the results.  Testing/Procedures: IN 6 MONTHS WITH LABWORK  Your physician has requested that you have an echocardiogram. Echocardiography is a painless test that uses sound waves to create images of your heart. It provides your doctor with information about the size and shape of your heart and how well your heart's chambers and valves are working. This procedure takes approximately one hour. There are no restrictions for this procedure.  Your physician has requested that you have a carotid duplex. This test is an ultrasound of the carotid arteries in your neck. It looks at blood flow through these arteries that supply the brain with blood. Allow one hour for this exam. There are no restrictions or special instructions.      Follow-Up: At [facility], you and your health needs are our priority.  As part of our continuing mission to provide you with exceptional heart care, we have created designated Provider Care Teams.  These Care Teams include your primary Cardiologist (physician) and Advanced Practice Providers (APPs -  Physician Assistants and Nurse Practitioners) who all work together to provide you with the care you need, when you need it. You will need a follow up appointment in 6 months.  Please call our office  2 months in advance to schedule this appointment.  You may see [name], MD or one of the following Advanced Practice Providers on your designated Care Team:   [place], PA-C Melina Copa, PA-C . Ermalinda Barrios, PA-C  Any Other Special Instructions Will Be Listed Below (If Applicable).

## 2018-12-02 DIAGNOSIS — M25562 Pain in left knee: Secondary | ICD-10-CM | POA: Diagnosis not present

## 2018-12-14 ENCOUNTER — Ambulatory Visit: Payer: Medicare Other | Admitting: Cardiology

## 2018-12-21 ENCOUNTER — Ambulatory Visit (INDEPENDENT_AMBULATORY_CARE_PROVIDER_SITE_OTHER): Payer: Medicare Other | Admitting: *Deleted

## 2018-12-21 DIAGNOSIS — I455 Other specified heart block: Secondary | ICD-10-CM

## 2018-12-21 DIAGNOSIS — I4510 Unspecified right bundle-branch block: Secondary | ICD-10-CM

## 2018-12-21 LAB — CUP PACEART REMOTE DEVICE CHECK
Battery Remaining Longevity: 152 mo
Battery Voltage: 3.03 V
Brady Statistic AP VP Percent: 0.03 %
Brady Statistic AP VS Percent: 59.58 %
Brady Statistic AS VP Percent: 0.02 %
Brady Statistic AS VS Percent: 40.38 %
Brady Statistic RA Percent Paced: 60.55 %
Brady Statistic RV Percent Paced: 0.05 %
Date Time Interrogation Session: 20201013013308
Implantable Lead Implant Date: 20190805
Implantable Lead Implant Date: 20190805
Implantable Lead Location: 753859
Implantable Lead Location: 753860
Implantable Lead Model: 5076
Implantable Lead Model: 5076
Implantable Pulse Generator Implant Date: 20190805
Lead Channel Impedance Value: 323 Ohm
Lead Channel Impedance Value: 342 Ohm
Lead Channel Impedance Value: 380 Ohm
Lead Channel Impedance Value: 418 Ohm
Lead Channel Pacing Threshold Amplitude: 0.5 V
Lead Channel Pacing Threshold Amplitude: 0.625 V
Lead Channel Pacing Threshold Pulse Width: 0.4 ms
Lead Channel Pacing Threshold Pulse Width: 0.4 ms
Lead Channel Sensing Intrinsic Amplitude: 3.625 mV
Lead Channel Sensing Intrinsic Amplitude: 3.625 mV
Lead Channel Sensing Intrinsic Amplitude: 5.75 mV
Lead Channel Sensing Intrinsic Amplitude: 5.75 mV
Lead Channel Setting Pacing Amplitude: 2 V
Lead Channel Setting Pacing Amplitude: 2.5 V
Lead Channel Setting Pacing Pulse Width: 0.4 ms
Lead Channel Setting Sensing Sensitivity: 1.2 mV

## 2018-12-22 ENCOUNTER — Telehealth: Payer: Self-pay | Admitting: Cardiology

## 2018-12-22 NOTE — Telephone Encounter (Signed)
LMOM. Call office to answer questions and assess patient.

## 2018-12-22 NOTE — Telephone Encounter (Signed)
Will send this message to Dr. [name] as an Juluis Rainier, and to our device clinic, not Sharman Cheek RN, for he is inquiring about his device.

## 2018-12-22 NOTE — Telephone Encounter (Signed)
New message     Patient want Dr [name] to know that 2 days ago while in the [employer] store, he felt "fainty".  He sat down and checked his pulse.  He was in sinus rhythum.  A few minutes later, he was fine.  He has left a voicemail message for Garth Bigness, the device nurse but he wanted Dr [name] to know.  You do not have to call him back.

## 2018-12-23 NOTE — Telephone Encounter (Signed)
Reviewed transmission received on 12/20/18 at 20:33. Normal PPM function. Presenting rhythm AS/VS 60s. No AT/AF episodes. 1 ventricular high rate episode on 12/05/18 at 07:28--1:1 SVT, ~15sec duration per plot, V rates hovering around 150-171bpm. Histograms appropriate. AP 60.6%, VP <0.1%.  Spoke with patient. He reports an episode of sudden dizziness on Monday, 12/20/18 between 15:00-16:00. He was standing at the counter in the [employer] store, had to sit down suddenly. No syncope. Dizziness lasted <2 min, symptoms resolved and HR normal by the time patient checked it. Explained no arrhythmias recorded by PPM correlate with this event.  Pt reports he does not drink enough fluids, wife is always reminding him to drink more. BPs run [id number] systolic at home. Reviewed medications--patient reports he takes a BP med. Listed prescription names off bottles at home, explained rosuvastatin is for cholesterol, not BP. Reviewed historic meds, noted pt previously on losartan but it was d/c in 12/2017 due to orthostatic hypotension. Pt then stated, "I think that's what's in this bottle, I may have refilled this bottle with the wrong medicine." Encouraged pt to review with his pharmacist to confirm if the contents of his bottle are rosuvastatin or losartan. Pt verbalizes agreement with plan and will contact pharmacy. He denies any additional questions at this time.  Routed to [facility], LPN, to follow back up with pt.

## 2018-12-29 NOTE — Telephone Encounter (Signed)
Spoke back with the pt and all his meds correlate with what we have on file.  He is in clear understanding of what he is to take.  Pt had no further questions at this time.  Pt was more than gracious for all the assistance provided.

## 2018-12-31 NOTE — Progress Notes (Signed)
Remote pacemaker transmission.   

## 2019-01-04 DIAGNOSIS — H903 Sensorineural hearing loss, bilateral: Secondary | ICD-10-CM | POA: Diagnosis not present

## 2019-02-07 ENCOUNTER — Other Ambulatory Visit: Payer: Self-pay | Admitting: Orthopedic Surgery

## 2019-02-07 ENCOUNTER — Telehealth: Payer: Self-pay | Admitting: Cardiology

## 2019-02-07 ENCOUNTER — Telehealth: Payer: Self-pay | Admitting: *Deleted

## 2019-02-07 ENCOUNTER — Other Ambulatory Visit (HOSPITAL_COMMUNITY): Payer: Self-pay | Admitting: Orthopedic Surgery

## 2019-02-07 DIAGNOSIS — M25562 Pain in left knee: Secondary | ICD-10-CM

## 2019-02-07 NOTE — Telephone Encounter (Signed)
Can we add him at 12:40 on Thursday? Thank you, KN

## 2019-02-07 NOTE — Telephone Encounter (Signed)
See separate phone note opened 02/07/19.

## 2019-02-07 NOTE — Telephone Encounter (Signed)
Pt wants a note stating he is MRI capable. I let him talk with device nurse Raquel Sarna, RN.

## 2019-02-07 NOTE — Telephone Encounter (Signed)
Pt c/o Shortness Of Breath: STAT if SOB developed within the last 24 hours or pt is noticeably SOB on the phone  1. Are you currently SOB (can you hear that pt is SOB on the phone)? No  2. How long have you been experiencing SOB? 3 days ago  3. Are you SOB when sitting or when up moving around? Moving around  4. Are you currently experiencing any other symptoms? Lightheaded, dizzy, cant lift light weights.  Patient states he had difficulty lifting luggage he should be able to cary with one hand over Thanksgiving break. His daughters advised him to have his pace maker checked. Would like to see Dr. Curt Bears soon but did not see any available appts except for DOD days next week.

## 2019-02-07 NOTE — Telephone Encounter (Signed)
Spoke with patient regarding MRI compatibility with his PPM. Pt's device and leads are MRI-conditional. Pt able to find his [employer] ID card, advised he will need to show this to have MRI performed. Pt attempting to get MRI scheduled at [facility] in the next few days prior to f/u with Dr. Maureen Ralphs later this week. Advised to call back if he needs any additional info in the meantime. Direct DC number given. No further questions at this time.

## 2019-02-07 NOTE — Telephone Encounter (Signed)
Patient's wife called back. Asked if we have any ability to help facilitate process for obtaining an MRI of Dr. Josefa Half knee as Dr. Peri Maris office is sending the orders to [facility] and now they have to wait for scheduling. Explained that our office does not actually coordinate the MRIs. Advised to find out from Dr. Peri Maris office if there is a scheduling phone number for [facility]. Pt's wife verbalizes understanding.  She also requested that patient be worked in to see Dr. [name] or Dr. Curt Bears sooner due to his episode of SOB and dizziness last week (see other phone note opened 02/07/19). Pt currently scheduled to see Dr. Curt Bears on 02/24/19. Explained that as pt declined to see anyone other than Drs. [name] and [employer], there are limited options, will forward message to Dr. Curt Bears and Venida Jarvis, RN, for any additional recommendations. Pt's wife in agreement with plan and thanked me for my call.

## 2019-02-07 NOTE — Telephone Encounter (Signed)
The pt states he was experiencing SOB Saturday for about an hour. I had him send a manual transmission with his home monitor for the nurse to review.

## 2019-02-07 NOTE — Telephone Encounter (Signed)
Reviewed manual PPM transmission. Normal device function. No episodes.  Spoke with patient. Pt reports he experienced about an hour of dizziness and some SOB and weakness [place]. Sudden onset/offset. No associated chest pain or syncope. Typically able to walk/run 5 miles, but suddenly found this activity very difficult. Didn't have access to BP cuff. Back at baseline since then.   Reassured pt that PPM function is normal. Pt verbalizes understanding, states he would like to discuss with Dr. [name] regarding other possible causes. Pt interested in f/u with Dr. [name] or APP if possible. Advised I will route message back to triage team to determine if any availability. Message also sent to EP scheduler to setup appointment with Dr. Curt Bears per recall. Pt in agreement with plan, no further questions at this time.

## 2019-02-07 NOTE — Telephone Encounter (Signed)
New Message:     Dr Alphonzo Cruise called and  Alexander [name] he need to be seen. He does not want to see anybody but Dr [name]. He said he would like to see her on Thursday and would like for to work him in please. I offered him appt to see DOD, but he did not want that appointment.  He saidt 4 days ago he had a lot of episodes of shortness of breath that last about an hour. Pt wants to be checked  out before he have knee surgery, very concerned about this.Marland Kitchen

## 2019-02-07 NOTE — Telephone Encounter (Signed)
Called patient who states he does want to be seen ASAP. However, pt does not want to be seen by anyone but Dr. [name]. Pt is aware that Dr. [name] does not have any openings in the immediate future but he feels as if Dr. Francesca Oman nurse can work him in. He would like to speak to [facility] or her nurse. I advised pt that they were both out of the office today. Pt states he will wait for them to come back. Pt denies any symptoms at this time. He denies CP, SOB, N/V, swelling or weight gain. Pt states he feels great at the moment but his SOB comes and goes but does not seem to be related to exertion. Routing to [employer] for Medical illustrator. Pt has been made aware to call us back if any other concerns present.

## 2019-02-08 DIAGNOSIS — M25562 Pain in left knee: Secondary | ICD-10-CM | POA: Diagnosis not present

## 2019-02-08 NOTE — Telephone Encounter (Signed)
Spoke to the pt and scheduled him for this Thursday 12/3 at 12:40 pm, as advised by Dr. [name].  Advised the pt to wear his facial mask to the appt and during the entire duration of this appt. Advised the pt to arrive 15 mins prior to this appt. Advised the pt that if his symptoms worsen between now and his appt date, then he should seek immediate medical care.  Pt verbalized understanding and agrees with this plan. Pt states he is resting well as this time.

## 2019-02-10 ENCOUNTER — Ambulatory Visit (INDEPENDENT_AMBULATORY_CARE_PROVIDER_SITE_OTHER): Payer: Medicare Other | Admitting: Cardiology

## 2019-02-10 ENCOUNTER — Other Ambulatory Visit: Payer: Self-pay

## 2019-02-10 ENCOUNTER — Ambulatory Visit (HOSPITAL_COMMUNITY): Payer: Medicare Other | Attending: Cardiovascular Disease

## 2019-02-10 ENCOUNTER — Encounter: Payer: Self-pay | Admitting: Cardiology

## 2019-02-10 VITALS — BP 142/78 | HR 73 | Ht 69.5 in | Wt 175.8 lb

## 2019-02-10 DIAGNOSIS — I2510 Atherosclerotic heart disease of native coronary artery without angina pectoris: Secondary | ICD-10-CM | POA: Diagnosis not present

## 2019-02-10 DIAGNOSIS — Z950 Presence of cardiac pacemaker: Secondary | ICD-10-CM | POA: Diagnosis not present

## 2019-02-10 DIAGNOSIS — I341 Nonrheumatic mitral (valve) prolapse: Secondary | ICD-10-CM | POA: Diagnosis not present

## 2019-02-10 DIAGNOSIS — I340 Nonrheumatic mitral (valve) insufficiency: Secondary | ICD-10-CM | POA: Diagnosis not present

## 2019-02-10 DIAGNOSIS — Z45018 Encounter for adjustment and management of other part of cardiac pacemaker: Secondary | ICD-10-CM

## 2019-02-10 DIAGNOSIS — E7849 Other hyperlipidemia: Secondary | ICD-10-CM

## 2019-02-10 DIAGNOSIS — I2584 Coronary atherosclerosis due to calcified coronary lesion: Secondary | ICD-10-CM

## 2019-02-10 DIAGNOSIS — R0602 Shortness of breath: Secondary | ICD-10-CM | POA: Insufficient documentation

## 2019-02-10 DIAGNOSIS — E785 Hyperlipidemia, unspecified: Secondary | ICD-10-CM | POA: Diagnosis not present

## 2019-02-10 LAB — ECHOCARDIOGRAM COMPLETE
Height: 69.5 in
Weight: 2812.8 oz

## 2019-02-10 NOTE — Progress Notes (Signed)
Cardiology Office Note    Date:  02/10/2019   ID:  Alexander [name], DOB 1941/01/24, MRN [id number]  PCP:  [name], MD  Cardiologist:  [name], MD   Chief complain: DOE  History of Present Illness:  Alexander [name] is a 78 y.o. 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 The patient has been followed by Dr. Mare Ferrari for years. He was he used to perform cardio exercise 3 times a week and play golf. He has retired in April 2019.   He presented to the hospital on October 11, 2017 with 3 episodes of syncope, he was diagnosed with sinus arrest and 15-second pause and he was implanted MDT dual chamber PPM on 10/12/17 by Dr Curt Bears.  There were no immediate post procedure complications.  He was afterwards experiencing more presyncopal episodes mostly associated with dehydration and resolved with good hydration.  He continued to exercise daily until start of Covid when he was unable to go to gym.  Over the summer he noticed some fatigue, his wife states that she is concerned about some subtle memory loss.  Last Saturday he had an episode of sudden onset shortness of breath that lasted for hour and a half.  It was not associated with exertion, there was no chest pain no palpitation dizziness or syncope.  He denies any exertional dyspnea however he is not doing any significant activity right now.  The patient is having significant pain in his left knee, he is undergoing cardiac MRI on Monday and will most probably require surgery by Dr. Wynelle Link.  Past Medical History:  Diagnosis Date  . Hyperlipidemia   . Hypertension   . MVP (mitral valve prolapse)    mild  . Syncope    Past Surgical History:  Procedure Laterality Date  . PACEMAKER IMPLANT N/A 10/12/2017   Procedure: PACEMAKER IMPLANT;  Surgeon: Constance Haw, MD;  Location: [facility];  Service: Cardiovascular;  Laterality: N/A;  . TONSILECTOMY, ADENOIDECTOMY, BILATERAL MYRINGOTOMY AND TUBES     as child  . TOTAL KNEE ARTHROPLASTY Left    Current Medications: Outpatient Medications Prior to Visit  Medication Sig Dispense Refill  . ibuprofen (ADVIL,MOTRIN) 200 MG tablet Take 400 mg by mouth daily as needed for headache (pain).     . Multiple Vitamin (MULTIVITAMIN WITH MINERALS) TABS tablet Take 1 tablet by mouth daily.    . rosuvastatin (CRESTOR) 5 MG tablet TAKE ONE TABLET EACH DAY 30 tablet 10  . zolpidem (AMBIEN) 10 MG tablet TAKE ONE TABLET AT BEDTIME AS NEEDED 90 tablet 0   No facility-administered medications prior to visit.     Allergies:   Nuts   Social History   Socioeconomic History  . Marital status: Married    Spouse name: Not on file  . Number of children: Not on file  . Years of education: Not on file  . Highest education level: Not on file  Occupational History  . Not on file  Social Needs  . Financial resource strain: Not on file  . Food insecurity    Worry: Not on file    Inability: Not on file  . Transportation needs    Medical: Not on file    Non-medical: Not on file  Tobacco Use  . Smoking status: Never Smoker  . Smokeless tobacco: Never Used  Substance and Sexual Activity  .  Alcohol use: Yes    Alcohol/week: 5.0 standard drinks    Types: 5 Glasses of wine per week  . Drug use: No  . Sexual activity: Not on file  Lifestyle  . Physical activity    Days per week: Not on file    Minutes per session: Not on file  . Stress: Not on file  Relationships  . Social Herbalist on phone: Not on file    Gets together: Not on file    Attends religious service: Not on file    Active member of club or organization: Not on file    Attends meetings of clubs or organizations: Not on file    Relationship status: Not on file  Other Topics Concern  . Not on file  Social History Narrative  . Not on file    Family  History:  The patient's family history includes Alzheimer's disease in his mother; Hypertension in his father.   ROS:   Please see the history of present illness.    ROS All other systems reviewed and are negative.  PHYSICAL EXAM:   VS:  BP (!) 142/78   Pulse 73   Ht 5' 9.5" (1.765 m)   Wt 175 lb 12.8 oz (79.7 kg)   SpO2 97%   BMI 25.59 kg/m    GEN: Well nourished, well developed, in no acute distress  HEENT: normal  Neck: no JVD, carotid bruits, or masses Cardiac: RRR; 5 out of 6 holosystolic murmur, rubs, or gallops,no edema  Respiratory:  clear to auscultation bilaterally, normal work of breathing GI: soft, nontender, nondistended, + BS MS: no deformity or atrophy  Skin: warm and dry, no rash Neuro:  Alert and Oriented x 3, Strength and sensation are intact Psych: euthymic mood, full affect  Wt Readings from Last 3 Encounters:  02/10/19 175 lb 12.8 oz (79.7 kg)  11/23/18 174 lb 1.9 oz (79 kg)  05/24/18 172 lb 6.4 oz (78.2 kg)    Studies/Labs Reviewed:   EKG:  EKG is ordered today.  It shows atrial paced rhythm, right bundle branch block, unchanged from prior.  This was personally reviewed.  Recent Labs: 05/24/2018: ALT 25; BUN 13; Creatinine, Ser 0.98; Hemoglobin 14.4; Platelets 196; Potassium 4.1; Sodium 145; TSH 2.780   Lipid Panel    Component Value Date/Time   CHOL 165 05/24/2018 0835   TRIG 77 05/24/2018 0835   HDL 85 05/24/2018 0835   CHOLHDL 1.9 05/24/2018 0835   LDLCALC 65 05/24/2018 0835    Additional studies/ records that were reviewed today include:   TTE: 10/12/2017 - Left ventricle: The cavity size was normal. Wall thickness was   increased in a pattern of mild LVH. Systolic function was normal.   The estimated ejection fraction was in the range of 60% to 65%.   Left ventricular diastolic function parameters were normal. - Mitral valve: Bileaflet MVP with moderate myxomatous changes and   posteriorly directed jet There was mild to moderate    regurgitation.   ASSESSMENT:    1. SOB (shortness of breath)   2. MVP (mitral valve prolapse)   3. Mitral valve insufficiency, unspecified etiology   4. Other hyperlipidemia   5. Hyperlipidemia, unspecified hyperlipidemia type   6. Cardiac pacemaker in situ   7. Encounter for interrogation of cardiac pacemaker   8. Coronary artery calcification     PLAN:  In order of problems listed above:  1. Shortness of breath, acute onset, I have reviewed pacemaker  interrogation he has no episodes of atrial fibrillation or other tachyarrhythmias.  I feel that this is related to worsening mitral regurgitation.  He is murmur is pretty significant, however repeat echocardiogram performed today in a clinic that I have personally reviewed shows moderate mitral regurgitation.  Mitral leaflets are myxomatous with bileaflet prolapse and the jet hits the posterior wall however on this transthoracic echo the jet is not very prominent.  Right-sided pressures are borderline at 30 mmHg.  LVEF is 55 to 60%. 2. Preoperative evaluation for knee repair/replacement, I would encouraged the patient undergoes his knee surgery and we will proceed with TEE to further evaluate his mitral valve afterwards.  If severe we will refer to Dr. [name] for a repair. 3. Status post pacemaker implantation for sinus arrest, pacemaker interrogated on Monday and functioning well.  EKG today shows atrial paced rhythm with right bundle branch block, unchanged from prior. 4. MVP, mitral regurgitation, as above. 5. Hypertension -well-controlled. 6. Hyperlipidemia, given the fact that he is showing signs of memory impairment, we will try to discontinue Crestor and see if he has any improvement of his symptoms, if he does we will refer him for PCSK9 inhibitors. 7. Coronary calcium score of 94. This was 65 percentile for age and sex matched control.   8. Ascending aortic diameter of 39 mm, he is explained that this is still within normal limits,  stable on today's echo.  Follow-up in 3 months, obtain labs today.  Medication Adjustments/Labs and Tests Ordered: Current medicines are reviewed at length with the patient today.  Concerns regarding medicines are outlined above.  Medication changes, Labs and Tests ordered today are listed in the Patient Instructions below. Patient Instructions  Medication Instructions:   Your physician recommends that you continue on your current medications as directed. Please refer to the Current Medication list given to you today.  *If you need a refill on your cardiac medications before your next appointment, please call your pharmacy*   Lab Work:  TODAY--CMET, CBC W DIFF, TSH, LIPIDS, AND PRO-BNP  If you have labs (blood work) drawn today and your tests are completely normal, you will receive your results only by: Marland Kitchen MyChart Message (if you have MyChart) OR . A paper copy in the mail If you have any lab test that is abnormal or we need to change your treatment, we will call you to review the results.   Testing/Procedures:  Your physician has requested that you have an echocardiogram. Echocardiography is a painless test that uses sound waves to create images of your heart. It provides your doctor with information about the size and shape of your heart and how well your heart's chambers and valves are working. This procedure takes approximately one hour. There are no restrictions for this procedure. TODAY WITH BILLY IN ROOM # 2 AT HIS 1:50 PM SLOT PER DR. [name]    Follow-Up: At [facility], you and your health needs are our priority.  As part of our continuing mission to provide you with exceptional heart care, we have created designated Provider Care Teams.  These Care Teams include your primary Cardiologist (physician) and Advanced Practice Providers (APPs -  Physician Assistants and Nurse Practitioners) who all work together to provide you with the care you need, when you need it.  Your  next appointment:   SCHEDULING ADD PATIENT TO DR. Francesca Oman SCHEDULE FOR May 12, 2019 AT 11:00 AM END SLOT   The format for your next appointment:   In Person  Provider:   [name], MD      Signed, [name], MD  02/10/2019 5:58 PM    [address], [place]  [address] Phone: [phone]; Fax: [phone]

## 2019-02-10 NOTE — Patient Instructions (Addendum)
Medication Instructions:   Your physician recommends that you continue on your current medications as directed. Please refer to the Current Medication list given to you today.  *If you need a refill on your cardiac medications before your next appointment, please call your pharmacy*   Lab Work:  TODAY--CMET, CBC W DIFF, TSH, LIPIDS, AND PRO-BNP  If you have labs (blood work) drawn today and your tests are completely normal, you will receive your results only by: Marland Kitchen MyChart Message (if you have MyChart) OR . A paper copy in the mail If you have any lab test that is abnormal or we need to change your treatment, we will call you to review the results.   Testing/Procedures:  Your physician has requested that you have an echocardiogram. Echocardiography is a painless test that uses sound waves to create images of your heart. It provides your doctor with information about the size and shape of your heart and how well your heart's chambers and valves are working. This procedure takes approximately one hour. There are no restrictions for this procedure. TODAY WITH BILLY IN ROOM # 2 AT HIS 1:50 PM SLOT PER DR. [name]    Follow-Up: At [facility], you and your health needs are our priority.  As part of our continuing mission to provide you with exceptional heart care, we have created designated Provider Care Teams.  These Care Teams include your primary Cardiologist (physician) and Advanced Practice Providers (APPs -  Physician Assistants and Nurse Practitioners) who all work together to provide you with the care you need, when you need it.  Your next appointment:   SCHEDULING ADD PATIENT TO DR. Lennie Hummer FOR May 12, 2019 AT 11:00 AM END SLOT   The format for your next appointment:   In Person  Provider:   [name], MD

## 2019-02-11 ENCOUNTER — Telehealth: Payer: Self-pay | Admitting: Cardiology

## 2019-02-11 LAB — CBC WITH DIFFERENTIAL/PLATELET
Basophils Absolute: 0 10*3/uL (ref 0.0–0.2)
Basos: 1 %
EOS (ABSOLUTE): 0.2 10*3/uL (ref 0.0–0.4)
Eos: 2 %
Hematocrit: 40.7 % (ref 37.5–51.0)
Hemoglobin: 13.8 g/dL (ref 13.0–17.7)
Immature Grans (Abs): 0 10*3/uL (ref 0.0–0.1)
Immature Granulocytes: 0 %
Lymphocytes Absolute: 2 10*3/uL (ref 0.7–3.1)
Lymphs: 27 %
MCH: 31.9 pg (ref 26.6–33.0)
MCHC: 33.9 g/dL (ref 31.5–35.7)
MCV: 94 fL (ref 79–97)
Monocytes Absolute: 1 10*3/uL — ABNORMAL HIGH (ref 0.1–0.9)
Monocytes: 14 %
Neutrophils Absolute: 4.2 10*3/uL (ref 1.4–7.0)
Neutrophils: 56 %
Platelets: 198 10*3/uL (ref 150–450)
RBC: 4.32 x10E6/uL (ref 4.14–5.80)
RDW: 12.6 % (ref 11.6–15.4)
WBC: 7.4 10*3/uL (ref 3.4–10.8)

## 2019-02-11 LAB — LIPID PANEL
Chol/HDL Ratio: 2.1 ratio (ref 0.0–5.0)
Cholesterol, Total: 169 mg/dL (ref 100–199)
HDL: 81 mg/dL (ref >39)
LDL Chol Calc (NIH): 73 mg/dL (ref 0–99)
Triglycerides: 80 mg/dL (ref 0–149)
VLDL Cholesterol Cal: 15 mg/dL (ref 5–40)

## 2019-02-11 LAB — COMPREHENSIVE METABOLIC PANEL
ALT: 21 IU/L (ref 0–44)
AST: 23 IU/L (ref 0–40)
Albumin/Globulin Ratio: 2.6 — ABNORMAL HIGH (ref 1.2–2.2)
Albumin: 4.5 g/dL (ref 3.7–4.7)
Alkaline Phosphatase: 64 IU/L (ref 39–117)
BUN/Creatinine Ratio: 20 (ref 10–24)
BUN: 17 mg/dL (ref 8–27)
Bilirubin Total: 0.5 mg/dL (ref 0.0–1.2)
CO2: 27 mmol/L (ref 20–29)
Calcium: 9.3 mg/dL (ref 8.6–10.2)
Chloride: 104 mmol/L (ref 96–106)
Creatinine, Ser: 0.87 mg/dL (ref 0.76–1.27)
GFR calc Af Amer: 95 mL/min/{1.73_m2} (ref >59)
GFR calc non Af Amer: 82 mL/min/{1.73_m2} (ref >59)
Globulin, Total: 1.7 g/dL (ref 1.5–4.5)
Glucose: 94 mg/dL (ref 65–99)
Potassium: 4.4 mmol/L (ref 3.5–5.2)
Sodium: 143 mmol/L (ref 134–144)
Total Protein: 6.2 g/dL (ref 6.0–8.5)

## 2019-02-11 LAB — PRO B NATRIURETIC PEPTIDE: NT-Pro BNP: 139 pg/mL (ref 0–486)

## 2019-02-11 LAB — TSH: TSH: 1.31 u[IU]/mL (ref 0.450–4.500)

## 2019-02-11 NOTE — Telephone Encounter (Signed)
[name], MD  Nuala Alpha, LPN        His mitral regurgitation remains moderate, LVEF remains normal, I would recommend that he proceeds with his knee surgery and we will arrange for a TEE afterwards.         Dr. [name] already called the pt and wife and endorsed to them both his echo results.

## 2019-02-11 NOTE — Telephone Encounter (Signed)
Follow up    Patients and his wife is returning call. She states that after you speak with her spouse to call her and give her the information as well.

## 2019-02-11 NOTE — Telephone Encounter (Signed)
New Message    Pt and his wife are calling to speak with Karlene Einstein  Pts wife is asking to call her number with results of echo   Please call

## 2019-02-14 ENCOUNTER — Other Ambulatory Visit: Payer: Self-pay

## 2019-02-14 ENCOUNTER — Ambulatory Visit (HOSPITAL_COMMUNITY)
Admission: RE | Admit: 2019-02-14 | Discharge: 2019-02-14 | Disposition: A | Discharge status: Home / Self Care | Payer: Medicare Other | Source: Ambulatory Visit | Attending: Orthopedic Surgery | Admitting: Orthopedic Surgery

## 2019-02-14 DIAGNOSIS — M25562 Pain in left knee: Secondary | ICD-10-CM | POA: Insufficient documentation

## 2019-02-15 ENCOUNTER — Ambulatory Visit: Payer: Medicare Other | Admitting: Cardiology

## 2019-02-16 ENCOUNTER — Telehealth: Payer: Self-pay | Admitting: *Deleted

## 2019-02-16 ENCOUNTER — Ambulatory Visit (HOSPITAL_COMMUNITY): Payer: Medicare Other

## 2019-02-16 NOTE — Telephone Encounter (Signed)
   [facility] Pre-operative Risk Assessment    Request for surgical clearance:  1. What type of surgery is being performed? LEFT TOTAL KNEE ARTHROPLASTY   2. When is this surgery scheduled? 02/23/19   3. What type of clearance is required (medical clearance vs. Pharmacy clearance to hold med vs. Both)? MEDICAL  4. Are there any medications that need to be held prior to surgery and how long? NONE LISTED   5. Practice name and name of physician performing surgery? [facility]; DR. FRANK ALUISIO   6. What is your office phone number [phone]    7.   What is your office fax number [phone] ATTN: KELLY HANCOCK  8.   Anesthesia type (None, local, MAC, general) ? NOT LISTED; CHOICE?   Alexander [name] 02/16/2019, 8:18 AM  _________________________________________________________________   (provider comments below)

## 2019-02-16 NOTE — Telephone Encounter (Signed)
   Primary Cardiologist: [name], MD  Chart reviewed as part of pre-operative protocol coverage. Pt was evaluated by Dr [name] 02/10/2019 in the office. Given past medical history and time since last visit, based on ACC/AHA guidelines, Alexander [name] would be at acceptable risk for the planned procedure without further cardiovascular testing.   I will route this recommendation to the requesting party via Epic fax function and remove from pre-op pool.  Please call with questions.  Kerin Ransom, PA-C 02/16/2019, 8:52 AM

## 2019-02-18 DIAGNOSIS — Z974 Presence of external hearing-aid: Secondary | ICD-10-CM | POA: Diagnosis not present

## 2019-02-18 DIAGNOSIS — H6121 Impacted cerumen, right ear: Secondary | ICD-10-CM | POA: Diagnosis not present

## 2019-02-18 DIAGNOSIS — H9113 Presbycusis, bilateral: Secondary | ICD-10-CM | POA: Diagnosis not present

## 2019-02-18 NOTE — Patient Instructions (Addendum)
DUE TO COVID-19 ONLY ONE VISITOR IS ALLOWED TO COME WITH YOU AND STAY IN THE WAITING ROOM ONLY DURING PRE OP AND PROCEDURE DAY OF SURGERY. THE 1 VISITOR MAY VISIT WITH YOU AFTER SURGERY IN YOUR PRIVATE ROOM DURING VISITING HOURS ONLY!  YOU NEED TO HAVE A COVID 19 TEST ON_______ @_______ , THIS TEST MUST BE DONE BEFORE SURGERY, COME  Alexander [name], Alexander [name] , [id number].  (Alexander [name]) ONCE YOUR COVID TEST IS COMPLETED, PLEASE BEGIN THE QUARANTINE INSTRUCTIONS AS OUTLINED IN YOUR HANDOUT.                Alexander [name]    Your procedure is scheduled on: 02-23-19    Report to [facility] Main  Entrance    Report to Admitting at 11:45 PM     Call this number if you have problems the morning of surgery [phone]    Remember: NO SOLID FOOD AFTER MIDNIGHT THE NIGHT PRIOR TO SURGERY.    NOTHING BY MOUTH EXCEPT CLEAR LIQUIDS UNTIL 11:15 PM . PLEASE FINISH ENSURE DRINK PER SURGEON ORDER  WHICH NEEDS TO  BE COMPLETED AT 11:15 PM .   CLEAR LIQUID DIET   Foods Allowed                                                                     Foods Excluded  Coffee and tea, regular and decaf                             liquids that you cannot  Plain Jell-O any favor except red or purple             see through such as: Fruit ices (not with fruit pulp)                                     milk, soups, orange juice  Iced Popsicles                                    All solid food Carbonated beverages, regular and diet                                    Cranberry, grape and apple juices Sports drinks like Gatorade Lightly seasoned clear broth or consume(fat free) Sugar, honey syrup  Sample Menu Breakfast                                Lunch                                     Supper Cranberry juice                    Beef broth  Chicken broth Jell-O                                     Grape juice                           Apple juice Coffee  or tea                        Jell-O                                      Popsicle                                                Coffee or tea                        Coffee or tea  _____________________________________________________________________     Take these medicines the morning of surgery with A SIP OF WATER: Rosuvastatin (Crestor)    BRUSH YOUR TEETH MORNING OF SURGERY AND RINSE YOUR MOUTH OUT, NO CHEWING GUM CANDY OR MINTS.                                 You may not have any metal on your body including hair pins and              piercings     Do not wear jewelry, cologne, lotions, powders or deodorant          .              Men may shave face and neck.   Do not bring valuables to the hospital. Alexander [name].  Contacts, dentures or bridgework may not be worn into surgery.  You may bring an overnight bag    Special Instructions: N/A              Please read over the following fact sheets you were given: _____________________________________________________________________             [facility] - Preparing for Surgery Before surgery, you can play an important role.  Because skin is not sterile, your skin needs to be as free of germs as possible.  You can reduce the number of germs on your skin by washing with CHG (chlorahexidine gluconate) soap before surgery.  CHG is an antiseptic cleaner which kills germs and bonds with the skin to continue killing germs even after washing. Please DO NOT use if you have an allergy to CHG or antibacterial soaps.  If your skin becomes reddened/irritated stop using the CHG and inform your nurse when you arrive at Short Stay. Do not shave (including legs and underarms) for at least 48 hours prior to the first CHG shower.  You may shave your face/neck. Please follow these instructions carefully:  1.  Shower with CHG Soap the night before surgery and the  morning of Surgery.  2.  If you  choose to wash your hair, wash your hair first as usual with your  normal  shampoo.  3.  After you shampoo, rinse your hair and body thoroughly to remove the  shampoo.                            4.  Use CHG as you would any other liquid soap.  You can apply chg directly  to the skin and wash                       Gently with a scrungie or clean washcloth.  5.  Apply the CHG Soap to your body ONLY FROM THE NECK DOWN.   Do not use on face/ open                           Wound or open sores. Avoid contact with eyes, ears mouth and genitals (private parts).                       Wash face,  Genitals (private parts) with your normal soap.             6.  Wash thoroughly, paying special attention to the area where your surgery  will be performed.  7.  Thoroughly rinse your body with warm water from the neck down.  8.  DO NOT shower/wash with your normal soap after using and rinsing off  the CHG Soap.                9.  Pat yourself dry with a clean towel.            10.  Wear clean pajamas.            11.  Place clean sheets on your bed the night of your first shower and do not  sleep with pets. Day of Surgery : Do not apply any lotions/deodorants the morning of surgery.  Please wear clean clothes to the hospital/surgery center.  FAILURE TO FOLLOW THESE INSTRUCTIONS MAY RESULT IN THE CANCELLATION OF YOUR SURGERY PATIENT SIGNATURE_________________________________  NURSE SIGNATURE__________________________________  ________________________________________________________________________   Alexander [name]  An incentive spirometer is a tool that can help keep your lungs clear and active. This tool measures how well you are filling your lungs with each breath. Taking long deep breaths may help reverse or decrease the chance of developing breathing (pulmonary) problems (especially infection) following:  A long period of time when you are unable to move or be active. BEFORE THE PROCEDURE   If  the spirometer includes an indicator to show your best effort, your nurse or respiratory therapist will set it to a desired goal.  If possible, sit up straight or lean slightly forward. Try not to slouch.  Hold the incentive spirometer in an upright position. INSTRUCTIONS FOR USE  1. Sit on the edge of your bed if possible, or sit up as far as you can in bed or on a chair. 2. Hold the incentive spirometer in an upright position. 3. Breathe out normally. 4. Place the mouthpiece in your mouth and seal your lips tightly around it. 5. Breathe in slowly and as deeply as possible, raising the piston or the ball toward the top of the column. 6. Hold your breath for 3-5 seconds or for as long as possible. Allow the piston or ball to fall  to the bottom of the column. 7. Remove the mouthpiece from your mouth and breathe out normally. 8. Rest for a few seconds and repeat Steps 1 through 7 at least 10 times every 1-2 hours when you are awake. Take your time and take a few normal breaths between deep breaths. 9. The spirometer may include an indicator to show your best effort. Use the indicator as a goal to work toward during each repetition. 10. After each set of 10 deep breaths, practice coughing to be sure your lungs are clear. If you have an incision (the cut made at the time of surgery), support your incision when coughing by placing a pillow or rolled up towels firmly against it. Once you are able to get out of bed, walk around indoors and cough well. You may stop using the incentive spirometer when instructed by your caregiver.  RISKS AND COMPLICATIONS  Take your time so you do not get dizzy or light-headed.  If you are in pain, you may need to take or ask for pain medication before doing incentive spirometry. It is harder to take a deep breath if you are having pain. AFTER USE  Rest and breathe slowly and easily.  It can be helpful to keep track of a log of your progress. Your caregiver can  provide you with a simple table to help with this. If you are using the spirometer at home, follow these instructions: [place] IF:   You are having difficultly using the spirometer.  You have trouble using the spirometer as often as instructed.  Your pain medication is not giving enough relief while using the spirometer.  You develop fever of 100.5 F (38.1 C) or higher. SEEK IMMEDIATE MEDICAL CARE IF:   You cough up bloody sputum that had not been present before.  You develop fever of 102 F (38.9 C) or greater.  You develop worsening pain at or near the incision site. MAKE SURE YOU:   Understand these instructions.  Will watch your condition.  Will get help right away if you are not doing well or get worse. Document Released: 07/07/2006 Document Revised: 05/19/2011 Document Reviewed: 09/07/2006 [employer] Patient Information 2014 [employer], [place].   ________________________________________________________________________  WHAT IS A BLOOD TRANSFUSION? Blood Transfusion Information  A transfusion is the replacement of blood or some of its parts. Blood is made up of multiple cells which provide different functions.  Red blood cells carry oxygen and are used for blood loss replacement.  White blood cells fight against infection.  Platelets control bleeding.  Plasma helps clot blood.  Other blood products are available for specialized needs, such as hemophilia or other clotting disorders. BEFORE THE TRANSFUSION  Who gives blood for transfusions?   Healthy volunteers who are fully evaluated to make sure their blood is safe. This is blood bank blood. Transfusion therapy is the safest it has ever been in the practice of medicine. Before blood is taken from a donor, a complete history is taken to make sure that person has no history of diseases nor engages in risky social behavior (examples are intravenous drug use or sexual activity with multiple partners). The  donor's travel history is screened to minimize risk of transmitting infections, such as malaria. The donated blood is tested for signs of infectious diseases, such as HIV and hepatitis. The blood is then tested to be sure it is compatible with you in order to minimize the chance of a transfusion reaction. If you or a relative donates  blood, this is often done in anticipation of surgery and is not appropriate for emergency situations. It takes many days to process the donated blood. RISKS AND COMPLICATIONS Although transfusion therapy is very safe and saves many lives, the main dangers of transfusion include:   Getting an infectious disease.  Developing a transfusion reaction. This is an allergic reaction to something in the blood you were given. Every precaution is taken to prevent this. The decision to have a blood transfusion has been considered carefully by your caregiver before blood is given. Blood is not given unless the benefits outweigh the risks. AFTER THE TRANSFUSION  Right after receiving a blood transfusion, you will usually feel much better and more energetic. This is especially true if your red blood cells have gotten low (anemic). The transfusion raises the level of the red blood cells which carry oxygen, and this usually causes an energy increase.  The nurse administering the transfusion will monitor you carefully for complications. HOME CARE INSTRUCTIONS  No special instructions are needed after a transfusion. You may find your energy is better. Speak with your caregiver about any limitations on activity for underlying diseases you may have. SEEK MEDICAL CARE IF:   Your condition is not improving after your transfusion.  You develop redness or irritation at the intravenous (IV) site. SEEK IMMEDIATE MEDICAL CARE IF:  Any of the following symptoms occur over the next 12 hours:  Shaking chills.  You have a temperature by mouth above 102 F (38.9 C), not controlled by  medicine.  Chest, back, or muscle pain.  People around you feel you are not acting correctly or are confused.  Shortness of breath or difficulty breathing.  Dizziness and fainting.  You get a rash or develop hives.  You have a decrease in urine output.  Your urine turns a dark color or changes to pink, red, or brown. Any of the following symptoms occur over the next 10 days:  You have a temperature by mouth above 102 F (38.9 C), not controlled by medicine.  Shortness of breath.  Weakness after normal activity.  The white part of the eye turns yellow (jaundice).  You have a decrease in the amount of urine or are urinating less often.  Your urine turns a dark color or changes to pink, red, or brown. Document Released: 02/22/2000 Document Revised: 05/19/2011 Document Reviewed: 10/11/2007 [facility] Patient Information 2014 [place], [place].  _______________________________________________________________________

## 2019-02-19 ENCOUNTER — Other Ambulatory Visit (HOSPITAL_COMMUNITY)
Admission: RE | Admit: 2019-02-19 | Discharge: 2019-02-19 | Disposition: A | Discharge status: Home / Self Care | Payer: Medicare Other | Source: Ambulatory Visit | Attending: Orthopedic Surgery | Admitting: Orthopedic Surgery

## 2019-02-19 DIAGNOSIS — Z01812 Encounter for preprocedural laboratory examination: Secondary | ICD-10-CM | POA: Insufficient documentation

## 2019-02-19 DIAGNOSIS — Z20828 Contact with and (suspected) exposure to other viral communicable diseases: Secondary | ICD-10-CM | POA: Insufficient documentation

## 2019-02-20 LAB — NOVEL CORONAVIRUS, NAA (HOSP ORDER, SEND-OUT TO REF LAB; TAT 18-24 HRS): SARS-CoV-2, NAA: NOT DETECTED

## 2019-02-22 ENCOUNTER — Other Ambulatory Visit: Payer: Self-pay

## 2019-02-22 ENCOUNTER — Encounter (HOSPITAL_COMMUNITY): Payer: Self-pay

## 2019-02-22 ENCOUNTER — Encounter (HOSPITAL_COMMUNITY)
Admission: RE | Admit: 2019-02-22 | Discharge: 2019-02-22 | Disposition: A | Discharge status: Home / Self Care | Payer: Medicare Other | Source: Ambulatory Visit | Attending: Orthopedic Surgery | Admitting: Orthopedic Surgery

## 2019-02-22 DIAGNOSIS — I10 Essential (primary) hypertension: Secondary | ICD-10-CM | POA: Insufficient documentation

## 2019-02-22 DIAGNOSIS — Z79899 Other long term (current) drug therapy: Secondary | ICD-10-CM | POA: Insufficient documentation

## 2019-02-22 DIAGNOSIS — Z01812 Encounter for preprocedural laboratory examination: Secondary | ICD-10-CM | POA: Insufficient documentation

## 2019-02-22 DIAGNOSIS — M1712 Unilateral primary osteoarthritis, left knee: Secondary | ICD-10-CM | POA: Insufficient documentation

## 2019-02-22 DIAGNOSIS — Z950 Presence of cardiac pacemaker: Secondary | ICD-10-CM | POA: Insufficient documentation

## 2019-02-22 DIAGNOSIS — I4510 Unspecified right bundle-branch block: Secondary | ICD-10-CM | POA: Insufficient documentation

## 2019-02-22 DIAGNOSIS — E785 Hyperlipidemia, unspecified: Secondary | ICD-10-CM | POA: Insufficient documentation

## 2019-02-22 LAB — COMPREHENSIVE METABOLIC PANEL
ALT: 23 U/L (ref 0–44)
AST: 25 U/L (ref 15–41)
Albumin: 4.1 g/dL (ref 3.5–5.0)
Alkaline Phosphatase: 53 U/L (ref 38–126)
Anion gap: 9 (ref 5–15)
BUN: 20 mg/dL (ref 8–23)
CO2: 27 mmol/L (ref 22–32)
Calcium: 9.4 mg/dL (ref 8.9–10.3)
Chloride: 105 mmol/L (ref 98–111)
Creatinine, Ser: 0.96 mg/dL (ref 0.61–1.24)
GFR calc Af Amer: >60 mL/min (ref >60)
GFR calc non Af Amer: >60 mL/min (ref >60)
Glucose, Bld: 102 mg/dL — ABNORMAL HIGH (ref 70–99)
Potassium: 4.4 mmol/L (ref 3.5–5.1)
Sodium: 141 mmol/L (ref 135–145)
Total Bilirubin: 1 mg/dL (ref 0.3–1.2)
Total Protein: 6.5 g/dL (ref 6.5–8.1)

## 2019-02-22 LAB — PROTIME-INR
INR: 0.9 (ref 0.8–1.2)
Prothrombin Time: 12.1 seconds (ref 11.4–15.2)

## 2019-02-22 LAB — CBC
HCT: 44 % (ref 39.0–52.0)
Hemoglobin: 14.8 g/dL (ref 13.0–17.0)
MCH: 32.6 pg (ref 26.0–34.0)
MCHC: 33.6 g/dL (ref 30.0–36.0)
MCV: 96.9 fL (ref 80.0–100.0)
Platelets: 172 10*3/uL (ref 150–400)
RBC: 4.54 MIL/uL (ref 4.22–5.81)
RDW: 12.8 % (ref 11.5–15.5)
WBC: 7.4 10*3/uL (ref 4.0–10.5)
nRBC: 0 % (ref 0.0–0.2)

## 2019-02-22 LAB — APTT: aPTT: 36 seconds (ref 24–36)

## 2019-02-22 LAB — ABO/RH: ABO/RH(D): O POS

## 2019-02-22 LAB — SURGICAL PCR SCREEN
MRSA, PCR: NEGATIVE
Staphylococcus aureus: NEGATIVE

## 2019-02-22 MED ORDER — BUPIVACAINE LIPOSOME 1.3 % IJ SUSP
20.0000 mL | Freq: Once | INTRAMUSCULAR | Status: DC
  Filled 2019-02-22: qty 20

## 2019-02-22 NOTE — H&P (Signed)
TOTAL KNEE ADMISSION H&P  Patient is being admitted for left total knee arthroplasty.  Subjective:  Chief Complaint:left knee pain.  HPI: Alexander [name], 78 y.o. male, has a history of pain and functional disability in the left knee due to arthritis and has failed non-surgical conservative treatments for greater than 12 weeks to includeNSAID's and/or analgesics, corticosteriod injections, flexibility and strengthening excercises, use of assistive devices, and activity modification.  Onset of symptoms was gradual, starting 5 years ago with gradually worsening course since that time. The patient noted no past surgery on the left knee(s).  Patient currently rates pain in the left knee(s) at 8 out of 10 with activity. Patient has night pain, worsening of pain with activity and weight bearing, pain that interferes with activities of daily living, pain with passive range of motion, crepitus, and joint swelling.  Patient has evidence of periarticular osteophytes and joint space narrowing by imaging studies.There is no active infection.  Patient Active Problem List   Diagnosis Date Noted  . Sinus arrest   . Hypertension   . Syncope   . Left lower lobe pulmonary nodule 05/06/2017  . Right bundle branch block 08/01/2010  . MVP (mitral valve prolapse)   . Hyperlipidemia    Past Medical History:  Diagnosis Date  . Hyperlipidemia   . Hypertension   . MVP (mitral valve prolapse)    mild  . Syncope     Past Surgical History:  Procedure Laterality Date  . HERNIA REPAIR    . PACEMAKER IMPLANT N/A 10/12/2017   Procedure: PACEMAKER IMPLANT;  Surgeon: Constance Haw, MD;  Location: [facility];  Service: Cardiovascular;  Laterality: N/A;  . TONSILECTOMY, ADENOIDECTOMY, BILATERAL MYRINGOTOMY AND TUBES     as child  . TOTAL KNEE ARTHROPLASTY Left     Current Facility-Administered Medications  Medication Dose Route Frequency Provider Last Rate Last Admin  . [START ON 02/23/2019]  bupivacaine liposome (EXPAREL) 1.3 % injection 266 mg  20 mL Other Once Gaynelle Arabian, MD       Current Outpatient Medications  Medication Sig Dispense Refill Last Dose  . acetaminophen (TYLENOL) 500 MG tablet Take 500-1,000 mg by mouth every 6 (six) hours as needed for mild pain.     Marland Kitchen ibuprofen (ADVIL,MOTRIN) 200 MG tablet Take 400 mg by mouth daily as needed (Knee pain).      . Multiple Vitamin (MULTIVITAMIN WITH MINERALS) TABS tablet Take 1 tablet by mouth daily.     Marland Kitchen zolpidem (AMBIEN) 10 MG tablet TAKE ONE TABLET AT BEDTIME AS NEEDED (Patient taking differently: Take 10 mg by mouth at bedtime as needed for sleep. ) 90 tablet 0   . rosuvastatin (CRESTOR) 5 MG tablet TAKE ONE TABLET EACH DAY (Patient taking differently: Take 5 mg by mouth daily. ) 30 tablet 10    Allergies  Allergen Reactions  . Nuts Hives, Itching and Swelling    Possible reaction to hazelnuts (tongue swelling)    Social History   Tobacco Use  . Smoking status: Never Smoker  . Smokeless tobacco: Never Used  Substance Use Topics  . Alcohol use: Yes    Alcohol/week: 5.0 standard drinks    Types: 5 Glasses of wine per week    Family History  Problem Relation Age of Onset  . Alzheimer's disease Mother   . Hypertension Father      Review of Systems  Constitutional: Positive for activity change. Negative for appetite change, chills, diaphoresis, fatigue, fever and unexpected weight change.  HENT: Negative.   Eyes: Negative.   Respiratory: Negative.   Cardiovascular: Negative.   Gastrointestinal: Negative.   Endocrine: Negative.   Genitourinary: Negative.   Musculoskeletal: Positive for arthralgias, gait problem, joint swelling and myalgias. Negative for back pain, neck pain and neck stiffness.  Skin: Negative.   Allergic/Immunologic: Negative.   Hematological: Negative.   Psychiatric/Behavioral: Negative.     Objective:  Physical Exam  Constitutional: He is oriented to person, place, and time. He  appears well-developed and well-nourished. No distress.  HENT:  Head: Normocephalic and atraumatic.  Eyes: Conjunctivae and EOM are normal.  Cardiovascular: Normal rate, regular rhythm, normal heart sounds and intact distal pulses.  No murmur heard. Respiratory: Effort normal and breath sounds normal. No respiratory distress. He has no wheezes.  GI: He exhibits no distension. There is no abdominal tenderness.  Musculoskeletal:     Cervical back: Normal range of motion.     Comments: Antalgic gait pattern with the use of a cane.  Left Knee Exam: Moderate effusion. Varus deformity. Range of motion is 5-130 degrees. No crepitus on range of motion of the knee. No medial joint line tenderness. Significant lateral joint line tenderness. Stable knee.  Neurological: He is alert and oriented to person, place, and time. He has normal strength. No sensory deficit.  Skin: No rash noted. He is not diaphoretic. No erythema.  Psychiatric: He has a normal mood and affect. His behavior is normal.    Vital signs in last 24 hours: Temp:  [97.7 F (36.5 C)] 97.7 F (36.5 C) (12/15 1056) Pulse Rate:  [64] 64 (12/15 1056) Resp:  [16] 16 (12/15 1056) BP: (136)/(75) 136/75 (12/15 1056) SpO2:  [97 %] 97 % (12/15 1056) Weight:  [77.7 kg] 77.7 kg (12/15 1056)  Labs:   Estimated body mass index is 24.93 kg/m as calculated from the following:   Height as of 02/22/19: 5' 9.5" (1.765 m).   Weight as of 02/22/19: 77.7 kg.   Imaging Review Plain radiographs demonstrate severe degenerative joint disease of the left knee(s). The overall alignment ismild varus. The bone quality appears to be good for age and reported activity level.    Assessment/Plan:  End stage primary osteoarthritis, left knee   The patient history, physical examination, clinical judgment of the provider and imaging studies are consistent with end stage degenerative joint disease of the left knee(s) and total knee arthroplasty is  deemed medically necessary. The treatment options including medical management, injection therapy arthroscopy and arthroplasty were discussed at length. The risks and benefits of total knee arthroplasty were presented and reviewed. The risks due to aseptic loosening, infection, stiffness, patella tracking problems, thromboembolic complications and other imponderables were discussed. The patient acknowledged the explanation, agreed to proceed with the plan and consent was signed. Patient is being admitted for inpatient treatment for surgery, pain control, PT, OT, prophylactic antibiotics, VTE prophylaxis, progressive ambulation and ADL's and discharge planning. The patient is planning to be discharged  home.    Anticipated LOS equal to or greater than 2 midnights due to - Age 68 and older with one or more of the following:  - Obesity  - Expected need for hospital services (PT, OT, Nursing) required for safe  discharge  - Anticipated need for postoperative skilled nursing care or inpatient rehab  - Active co-morbidities: MVP and arrhythmia  OR   - Unanticipated findings during/Post Surgery: None  - Patient is a high risk of re-admission due to: None    Risks and  benefits of the surgery were discussed with the patient and Dr.Aluisio at their previous office visit, and the patient has elected to move forward with the aforementioned surgery. Post-operative care plans were discussed with the patient today.  Therapy Plans: outpatient therapy at [facility] Disposition: Home with wife Planned DVT prophylaxis: Xarelto 10mg  daily DME needed: walker, 3-n-1, ice machine PCP: Dr. Joylene Draft Cardio: Dr. [name]- clearance received Other: wants Rx sent in 12/17; wants prescriptions for Miralax and Colace Wants to stay until Friday 12/18 regardless  Instructed patient on which meds to stop prior to surgery  Ardeen Jourdain, PA-C

## 2019-02-22 NOTE — Progress Notes (Signed)
Anesthesia Chart Review   Case: [id number] Date/Time: 02/23/19 1400   Procedure: TOTAL KNEE ARTHROPLASTY (Left Knee) - 63min   Anesthesia type: Choice   Pre-op diagnosis: left knee osteoarthritis   Location: [facility] ROOM 09 / [facility]   Surgeons: Gaynelle Arabian, MD      DISCUSSION:78 y.o. never smoker with h/o HLD, HTN, pacemaker in place (last check 12/21/2018, device orders pending), left knee OA scheduled for above procedure 02/23/2019 with Dr. Gaynelle Arabian.   Pt cleared by cardiology 02/16/2019.  Per Kerin Ransom, PA-C, "Pt was evaluated by Dr [name] 02/10/2019 in the office. Given past medical history and time since last visit, based on ACC/AHA guidelines, Alexander [name] would be at acceptable risk for the planned procedure without further cardiovascular testing."  Anticipate pt can proceed with planned procedure barring acute status change.   VS: BP 136/75 (BP Location: Left Arm)   Pulse 64   Temp 36.5 C (Oral)   Resp 16   Ht 5' 9.5" (1.765 m)   Wt 77.7 kg   SpO2 97%   BMI 24.93 kg/m   PROVIDERS: [name], MD is PCP   [name], MD is Cardiologist  LABS: Labs reviewed: Acceptable for surgery. (all labs ordered are listed, but only abnormal results are displayed)  Labs Reviewed  COMPREHENSIVE METABOLIC PANEL - Abnormal; Notable for the following components:      Result Value   Glucose, Bld 102 (*)    All other components within normal limits  SURGICAL PCR SCREEN  APTT  CBC  PROTIME-INR  TYPE AND SCREEN  ABO/RH     IMAGES: VAS US Carotid 11/23/2018 Summary: Right Carotid: Velocities in the right ICA are consistent with a 1-39% stenosis.  Left Carotid: Velocities in the left ICA are consistent with a 1-39% stenosis.  Vertebrals:  Bilateral vertebral arteries demonstrate antegrade flow. Subclavians: Normal flow hemodynamics were seen in bilateral subclavian              arteries.  EKG: 02/10/2019 Rate 63 bpm  Electronic atrial pacemaker Right  bundle branch block   CV: Echo 02/10/2019 IMPRESSIONS   1. Left ventricular ejection fraction, by visual estimation, is 55 to 60%. The left ventricle has normal function. There is no left ventricular hypertrophy.  2. Elevated left ventricular end-diastolic pressure.  3. Left ventricular diastolic parameters are consistent with Grade II diastolic dysfunction (pseudonormalization).  4. Mildly dilated left ventricular internal cavity size.  5. Global right ventricle has normal systolic function.The right ventricular size is normal. No increase in right ventricular wall thickness.  6. Left atrial size was normal.  7. Right atrial size was normal.  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 No evidence of mitral stenosis.  9. Mild bileaflet mitral valve prolapse. 10. The tricuspid valve is normal in structure. Tricuspid valve regurgitation is trivial. 11. The aortic valve is tricuspid. Aortic valve regurgitation is not visualized. No evidence of aortic valve sclerosis or stenosis. 12. The pulmonic valve was normal in structure. Pulmonic valve regurgitation is not visualized. 13. Moderately elevated pulmonary artery systolic pressure. 14. A pacer wire is visualized.  Past Medical History:  Diagnosis Date  . Hyperlipidemia   . Hypertension   . MVP (mitral valve prolapse)    mild  . Syncope     Past Surgical History:  Procedure Laterality Date  . HERNIA REPAIR    . PACEMAKER IMPLANT N/A 10/12/2017   Procedure: PACEMAKER IMPLANT;  Surgeon: Constance Haw, MD;  Location: [facility];  Service: Cardiovascular;  Laterality: N/A;  . TONSILECTOMY, ADENOIDECTOMY, BILATERAL MYRINGOTOMY AND TUBES     as child  . TOTAL KNEE ARTHROPLASTY Left     MEDICATIONS: . acetaminophen (TYLENOL) 500 MG tablet  . ibuprofen (ADVIL,MOTRIN) 200 MG tablet  . Multiple Vitamin (MULTIVITAMIN WITH MINERALS) TABS tablet  . rosuvastatin (CRESTOR) 5 MG tablet  . zolpidem  (AMBIEN) 10 MG tablet   No current facility-administered medications for this encounter.   Derrill Memo ON 02/23/2019] bupivacaine liposome (EXPAREL) 1.3 % injection 266 mg   Maia Plan [facility] Pre-Surgical Testing [phone] 02/22/19  4:26 PM

## 2019-02-22 NOTE — Progress Notes (Signed)
PCP -  Janee Morn MD Cardiologist - Dr. [name] MD  Chest x-ray - none EKG - 12/03/202 Stress Test - 02/10/2019 ECHO - 02/10/2019 Cardiac Cath - none  Sleep Study - none CPAP - none  Fasting Blood Sugar - 0 Checks Blood Sugar _____ times a day  Blood Thinner Instructions: none Aspirin Instructions: Last Dose:   Anesthesia review:   Patient denies shortness of breath, fever, cough and chest pain at PAT appointment   Patient verbalized understanding of instructions that were given to them at the PAT appointment. Patient was also instructed that they will need to review over the PAT instructions again at home before surgery.

## 2019-02-23 ENCOUNTER — Encounter (HOSPITAL_COMMUNITY)
Admission: RE | Disposition: A | Discharge status: Home / Self Care | Payer: Self-pay | Source: Home / Self Care | Attending: Orthopedic Surgery

## 2019-02-23 ENCOUNTER — Inpatient Hospital Stay (HOSPITAL_COMMUNITY): Payer: Medicare Other | Admitting: Physician Assistant

## 2019-02-23 ENCOUNTER — Other Ambulatory Visit: Payer: Self-pay

## 2019-02-23 ENCOUNTER — Encounter (HOSPITAL_COMMUNITY): Payer: Self-pay | Admitting: Orthopedic Surgery

## 2019-02-23 ENCOUNTER — Inpatient Hospital Stay (HOSPITAL_COMMUNITY)
Admission: RE | Admit: 2019-02-23 | Discharge: 2019-02-27 | DRG: 470 | Disposition: A | Discharge status: Home / Self Care | Payer: Medicare Other | Attending: Orthopedic Surgery | Admitting: Orthopedic Surgery

## 2019-02-23 ENCOUNTER — Inpatient Hospital Stay (HOSPITAL_COMMUNITY): Payer: Medicare Other | Admitting: Certified Registered Nurse Anesthetist

## 2019-02-23 DIAGNOSIS — F411 Generalized anxiety disorder: Secondary | ICD-10-CM

## 2019-02-23 DIAGNOSIS — I341 Nonrheumatic mitral (valve) prolapse: Secondary | ICD-10-CM | POA: Diagnosis present

## 2019-02-23 DIAGNOSIS — M1710 Unilateral primary osteoarthritis, unspecified knee: Secondary | ICD-10-CM | POA: Diagnosis present

## 2019-02-23 DIAGNOSIS — E785 Hyperlipidemia, unspecified: Secondary | ICD-10-CM | POA: Diagnosis present

## 2019-02-23 DIAGNOSIS — G251 Drug-induced tremor: Secondary | ICD-10-CM | POA: Diagnosis not present

## 2019-02-23 DIAGNOSIS — Z79899 Other long term (current) drug therapy: Secondary | ICD-10-CM

## 2019-02-23 DIAGNOSIS — Z91018 Allergy to other foods: Secondary | ICD-10-CM

## 2019-02-23 DIAGNOSIS — F4541 Pain disorder exclusively related to psychological factors: Secondary | ICD-10-CM

## 2019-02-23 DIAGNOSIS — I10 Essential (primary) hypertension: Secondary | ICD-10-CM | POA: Diagnosis present

## 2019-02-23 DIAGNOSIS — M179 Osteoarthritis of knee, unspecified: Secondary | ICD-10-CM | POA: Diagnosis present

## 2019-02-23 DIAGNOSIS — Z8249 Family history of ischemic heart disease and other diseases of the circulatory system: Secondary | ICD-10-CM

## 2019-02-23 DIAGNOSIS — M1712 Unilateral primary osteoarthritis, left knee: Principal | ICD-10-CM | POA: Diagnosis present

## 2019-02-23 DIAGNOSIS — Z9181 History of falling: Secondary | ICD-10-CM

## 2019-02-23 DIAGNOSIS — M25762 Osteophyte, left knee: Secondary | ICD-10-CM | POA: Diagnosis present

## 2019-02-23 DIAGNOSIS — Y92230 Patient room in hospital as the place of occurrence of the external cause: Secondary | ICD-10-CM | POA: Diagnosis not present

## 2019-02-23 DIAGNOSIS — T402X5A Adverse effect of other opioids, initial encounter: Secondary | ICD-10-CM | POA: Diagnosis not present

## 2019-02-23 HISTORY — PX: TOTAL KNEE ARTHROPLASTY: SHX125

## 2019-02-23 LAB — TYPE AND SCREEN
ABO/RH(D): O POS
Antibody Screen: NEGATIVE

## 2019-02-23 SURGERY — ARTHROPLASTY, KNEE, TOTAL
Anesthesia: Spinal | Site: Knee | Laterality: Left

## 2019-02-23 MED ORDER — HYDROMORPHONE HCL 1 MG/ML IJ SOLN
0.5000 mg | Freq: Once | INTRAMUSCULAR | Status: AC
  Administered 2019-02-23: 0.5 mg via INTRAVENOUS
  Filled 2019-02-23: qty 0.5

## 2019-02-23 MED ORDER — ONDANSETRON HCL 4 MG/2ML IJ SOLN
INTRAMUSCULAR | Status: DC | PRN
  Administered 2019-02-23: 4 mg via INTRAVENOUS

## 2019-02-23 MED ORDER — TRANEXAMIC ACID-NACL 1000-0.7 MG/100ML-% IV SOLN
1000.0000 mg | INTRAVENOUS | Status: AC
  Administered 2019-02-23: 1000 mg via INTRAVENOUS
  Filled 2019-02-23: qty 100

## 2019-02-23 MED ORDER — PHENOL 1.4 % MT LIQD: 1.0000 | OROMUCOSAL | Status: DC | PRN

## 2019-02-23 MED ORDER — SODIUM CHLORIDE (PF) 0.9 % IJ SOLN
INTRAMUSCULAR | Status: AC
  Filled 2019-02-23: qty 50

## 2019-02-23 MED ORDER — RIVAROXABAN 10 MG PO TABS
10.0000 mg | ORAL_TABLET | Freq: Every day | ORAL | Status: DC
  Administered 2019-02-24 – 2019-02-27 (×4): 10 mg via ORAL
  Filled 2019-02-23 (×4): qty 1

## 2019-02-23 MED ORDER — SODIUM CHLORIDE 0.9 % IV SOLN: INTRAVENOUS | Status: DC

## 2019-02-23 MED ORDER — METOCLOPRAMIDE HCL 5 MG/ML IJ SOLN: 5.0000 mg | Freq: Three times a day (TID) | INTRAMUSCULAR | Status: DC | PRN

## 2019-02-23 MED ORDER — MIDAZOLAM HCL 2 MG/2ML IJ SOLN
1.0000 mg | INTRAMUSCULAR | Status: AC
  Administered 2019-02-23 (×2): 1 mg via INTRAVENOUS
  Filled 2019-02-23: qty 2

## 2019-02-23 MED ORDER — ONDANSETRON HCL 4 MG/2ML IJ SOLN
INTRAMUSCULAR | Status: AC
  Filled 2019-02-23: qty 2

## 2019-02-23 MED ORDER — TRAMADOL HCL 50 MG PO TABS
50.0000 mg | ORAL_TABLET | Freq: Four times a day (QID) | ORAL | Status: DC | PRN
  Administered 2019-02-24: 50 mg via ORAL
  Administered 2019-02-24 – 2019-02-27 (×6): 100 mg via ORAL
  Filled 2019-02-23 (×7): qty 2

## 2019-02-23 MED ORDER — DEXAMETHASONE SODIUM PHOSPHATE 10 MG/ML IJ SOLN
10.0000 mg | Freq: Once | INTRAMUSCULAR | Status: AC
  Administered 2019-02-24: 10 mg via INTRAVENOUS
  Filled 2019-02-23: qty 1

## 2019-02-23 MED ORDER — ONDANSETRON HCL 4 MG/2ML IJ SOLN: 4.0000 mg | Freq: Four times a day (QID) | INTRAMUSCULAR | Status: DC | PRN

## 2019-02-23 MED ORDER — METOCLOPRAMIDE HCL 5 MG PO TABS: 5.0000 mg | ORAL_TABLET | Freq: Three times a day (TID) | ORAL | Status: DC | PRN

## 2019-02-23 MED ORDER — MORPHINE SULFATE (PF) 2 MG/ML IV SOLN
0.5000 mg | INTRAVENOUS | Status: DC | PRN
  Administered 2019-02-23 – 2019-02-24 (×5): 1 mg via INTRAVENOUS
  Filled 2019-02-23 (×6): qty 1

## 2019-02-23 MED ORDER — ONDANSETRON HCL 4 MG PO TABS: 4.0000 mg | ORAL_TABLET | Freq: Four times a day (QID) | ORAL | Status: DC | PRN

## 2019-02-23 MED ORDER — LACTATED RINGERS IV SOLN: INTRAVENOUS | Status: DC

## 2019-02-23 MED ORDER — FENTANYL CITRATE (PF) 100 MCG/2ML IJ SOLN
25.0000 ug | INTRAMUSCULAR | Status: DC | PRN
  Administered 2019-02-23 (×2): 50 ug via INTRAVENOUS

## 2019-02-23 MED ORDER — GABAPENTIN 100 MG PO CAPS
200.0000 mg | ORAL_CAPSULE | Freq: Three times a day (TID) | ORAL | Status: DC
  Administered 2019-02-23 – 2019-02-27 (×11): 200 mg via ORAL
  Filled 2019-02-23 (×12): qty 2

## 2019-02-23 MED ORDER — POLYETHYLENE GLYCOL 3350 17 G PO PACK
17.0000 g | PACK | Freq: Every day | ORAL | Status: DC | PRN
  Administered 2019-02-26: 17 g via ORAL
  Filled 2019-02-23: qty 1

## 2019-02-23 MED ORDER — STERILE WATER FOR IRRIGATION IR SOLN
Status: DC | PRN
  Administered 2019-02-23: 2000 mL

## 2019-02-23 MED ORDER — OXYCODONE HCL 5 MG PO TABS
5.0000 mg | ORAL_TABLET | ORAL | Status: DC | PRN
  Administered 2019-02-23 (×2): 10 mg via ORAL
  Filled 2019-02-23 (×3): qty 2

## 2019-02-23 MED ORDER — BUPIVACAINE LIPOSOME 1.3 % IJ SUSP
INTRAMUSCULAR | Status: DC | PRN
  Administered 2019-02-23: 20 mL

## 2019-02-23 MED ORDER — DEXAMETHASONE SODIUM PHOSPHATE 10 MG/ML IJ SOLN
INTRAMUSCULAR | Status: AC
  Filled 2019-02-23: qty 1

## 2019-02-23 MED ORDER — METHOCARBAMOL 500 MG PO TABS
500.0000 mg | ORAL_TABLET | Freq: Four times a day (QID) | ORAL | Status: DC | PRN
  Administered 2019-02-23 – 2019-02-27 (×10): 500 mg via ORAL
  Filled 2019-02-23 (×12): qty 1

## 2019-02-23 MED ORDER — SODIUM CHLORIDE (PF) 0.9 % IJ SOLN
INTRAMUSCULAR | Status: AC
  Filled 2019-02-23: qty 10

## 2019-02-23 MED ORDER — OXYCODONE HCL 5 MG/5ML PO SOLN: 5.0000 mg | Freq: Once | ORAL | Status: DC | PRN

## 2019-02-23 MED ORDER — DOCUSATE SODIUM 100 MG PO CAPS
100.0000 mg | ORAL_CAPSULE | Freq: Two times a day (BID) | ORAL | Status: DC
  Administered 2019-02-23 – 2019-02-27 (×8): 100 mg via ORAL
  Filled 2019-02-23 (×9): qty 1

## 2019-02-23 MED ORDER — DIPHENHYDRAMINE HCL 12.5 MG/5ML PO ELIX: 12.5000 mg | ORAL_SOLUTION | ORAL | Status: DC | PRN

## 2019-02-23 MED ORDER — DEXAMETHASONE SODIUM PHOSPHATE 10 MG/ML IJ SOLN
8.0000 mg | Freq: Once | INTRAMUSCULAR | Status: AC
  Administered 2019-02-23: 8 mg via INTRAVENOUS

## 2019-02-23 MED ORDER — METHOCARBAMOL 500 MG IVPB - SIMPLE MED
INTRAVENOUS | Status: AC
  Filled 2019-02-23: qty 50

## 2019-02-23 MED ORDER — PROPOFOL 10 MG/ML IV BOLUS
INTRAVENOUS | Status: DC | PRN
  Administered 2019-02-23: 20 mg via INTRAVENOUS

## 2019-02-23 MED ORDER — METHOCARBAMOL 500 MG IVPB - SIMPLE MED
500.0000 mg | Freq: Four times a day (QID) | INTRAVENOUS | Status: DC | PRN
  Administered 2019-02-23: 500 mg via INTRAVENOUS
  Filled 2019-02-23: qty 50

## 2019-02-23 MED ORDER — ROPIVACAINE HCL 5 MG/ML IJ SOLN
INTRAMUSCULAR | Status: DC | PRN
  Administered 2019-02-23: 30 mL via PERINEURAL

## 2019-02-23 MED ORDER — PROPOFOL 500 MG/50ML IV EMUL
INTRAVENOUS | Status: AC
  Filled 2019-02-23: qty 50

## 2019-02-23 MED ORDER — ZOLPIDEM TARTRATE 5 MG PO TABS
5.0000 mg | ORAL_TABLET | Freq: Every evening | ORAL | Status: DC | PRN
  Administered 2019-02-23 – 2019-02-27 (×2): 5 mg via ORAL
  Filled 2019-02-23 (×2): qty 1

## 2019-02-23 MED ORDER — FENTANYL CITRATE (PF) 100 MCG/2ML IJ SOLN
50.0000 ug | INTRAMUSCULAR | Status: AC
  Administered 2019-02-23 (×2): 50 ug via INTRAVENOUS
  Filled 2019-02-23: qty 2

## 2019-02-23 MED ORDER — CEFAZOLIN SODIUM-DEXTROSE 2-4 GM/100ML-% IV SOLN
2.0000 g | INTRAVENOUS | Status: AC
  Administered 2019-02-23: 2 g via INTRAVENOUS
  Filled 2019-02-23: qty 100

## 2019-02-23 MED ORDER — ACETAMINOPHEN 10 MG/ML IV SOLN
1000.0000 mg | Freq: Four times a day (QID) | INTRAVENOUS | Status: DC
  Administered 2019-02-23: 1000 mg via INTRAVENOUS
  Filled 2019-02-23: qty 100

## 2019-02-23 MED ORDER — TRANEXAMIC ACID-NACL 1000-0.7 MG/100ML-% IV SOLN
1000.0000 mg | Freq: Once | INTRAVENOUS | Status: AC
  Administered 2019-02-23: 1000 mg via INTRAVENOUS
  Filled 2019-02-23: qty 100

## 2019-02-23 MED ORDER — SODIUM CHLORIDE (PF) 0.9 % IJ SOLN
INTRAMUSCULAR | Status: DC | PRN
  Administered 2019-02-23: 60 mL

## 2019-02-23 MED ORDER — CHLORHEXIDINE GLUCONATE 4 % EX LIQD: 60.0000 mL | Freq: Once | CUTANEOUS | Status: DC

## 2019-02-23 MED ORDER — MENTHOL 3 MG MT LOZG: 1.0000 | LOZENGE | OROMUCOSAL | Status: DC | PRN

## 2019-02-23 MED ORDER — OXYCODONE HCL 5 MG PO TABS: 5.0000 mg | ORAL_TABLET | Freq: Once | ORAL | Status: DC | PRN

## 2019-02-23 MED ORDER — PROPOFOL 500 MG/50ML IV EMUL
INTRAVENOUS | Status: DC | PRN
  Administered 2019-02-23: 100 ug/kg/min via INTRAVENOUS

## 2019-02-23 MED ORDER — BISACODYL 10 MG RE SUPP
10.0000 mg | Freq: Every day | RECTAL | Status: DC | PRN
  Administered 2019-02-27: 10 mg via RECTAL
  Filled 2019-02-23: qty 1

## 2019-02-23 MED ORDER — POVIDONE-IODINE 10 % EX SWAB
2.0000 "application " | Freq: Once | CUTANEOUS | Status: AC
  Administered 2019-02-23: 2 via TOPICAL

## 2019-02-23 MED ORDER — CEFAZOLIN SODIUM-DEXTROSE 2-4 GM/100ML-% IV SOLN
2.0000 g | Freq: Four times a day (QID) | INTRAVENOUS | Status: AC
  Administered 2019-02-23 – 2019-02-24 (×2): 2 g via INTRAVENOUS
  Filled 2019-02-23 (×2): qty 100

## 2019-02-23 MED ORDER — FENTANYL CITRATE (PF) 100 MCG/2ML IJ SOLN
INTRAMUSCULAR | Status: AC
  Filled 2019-02-23: qty 2

## 2019-02-23 MED ORDER — ONDANSETRON HCL 4 MG/2ML IJ SOLN: 4.0000 mg | Freq: Once | INTRAMUSCULAR | Status: DC | PRN

## 2019-02-23 MED ORDER — SODIUM CHLORIDE 0.9 % IR SOLN
Status: DC | PRN
  Administered 2019-02-23: 1000 mL

## 2019-02-23 MED ORDER — ACETAMINOPHEN 500 MG PO TABS
1000.0000 mg | ORAL_TABLET | Freq: Four times a day (QID) | ORAL | Status: AC
  Administered 2019-02-23 – 2019-02-24 (×4): 1000 mg via ORAL
  Filled 2019-02-23 (×4): qty 2

## 2019-02-23 MED ORDER — FLEET ENEMA 7-19 GM/118ML RE ENEM: 1.0000 | ENEMA | Freq: Once | RECTAL | Status: DC | PRN

## 2019-02-23 SURGICAL SUPPLY — 61 items
ATTUNE MED DOME PAT 41 KNEE (Knees) ×1 IMPLANT
ATTUNE MED DOME PAT 41MM KNEE (Knees) ×1 IMPLANT
ATTUNE PS FEM LT SZ 7 CEM KNEE (Femur) ×2 IMPLANT
ATTUNE PSRP INSR SZ7 10 KNEE (Insert) ×1 IMPLANT
ATTUNE PSRP INSR SZ7 10MM KNEE (Insert) ×1 IMPLANT
BAG ZIPLOCK 12X15 (MISCELLANEOUS) ×3 IMPLANT
BASE TIBIAL ROT PLAT SZ 7 KNEE (Knees) IMPLANT
BLADE SAG 18X100X1.27 (BLADE) ×3 IMPLANT
BLADE SAW SGTL 11.0X1.19X90.0M (BLADE) ×3 IMPLANT
BLADE SURG SZ10 CARB STEEL (BLADE) ×6 IMPLANT
BNDG ELASTIC 6X5.8 VLCR STR LF (GAUZE/BANDAGES/DRESSINGS) ×3 IMPLANT
BOWL SMART MIX CTS (DISPOSABLE) ×3 IMPLANT
CEMENT HV SMART SET (Cement) ×6 IMPLANT
CLOSURE WOUND 1/2 X4 (GAUZE/BANDAGES/DRESSINGS) ×1
COVER SURGICAL LIGHT HANDLE (MISCELLANEOUS) ×3 IMPLANT
COVER WAND RF STERILE (DRAPES) IMPLANT
CUFF TOURN SGL QUICK 34 (TOURNIQUET CUFF) ×2
CUFF TRNQT CYL 34X4.125X (TOURNIQUET CUFF) ×1 IMPLANT
DECANTER SPIKE VIAL GLASS SM (MISCELLANEOUS) ×3 IMPLANT
DRAPE U-SHAPE 47X51 STRL (DRAPES) ×3 IMPLANT
DRSG ADAPTIC 3X8 NADH LF (GAUZE/BANDAGES/DRESSINGS) ×3 IMPLANT
DRSG PAD ABDOMINAL 8X10 ST (GAUZE/BANDAGES/DRESSINGS) ×3 IMPLANT
DURAPREP 26ML APPLICATOR (WOUND CARE) ×3 IMPLANT
ELECT REM PT RETURN 15FT ADLT (MISCELLANEOUS) ×3 IMPLANT
EVACUATOR 1/8 PVC DRAIN (DRAIN) ×3 IMPLANT
GAUZE SPONGE 4X4 12PLY STRL (GAUZE/BANDAGES/DRESSINGS) ×3 IMPLANT
GLOVE BIO SURGEON STRL SZ7 (GLOVE) ×3 IMPLANT
GLOVE BIO SURGEON STRL SZ8 (GLOVE) ×3 IMPLANT
GLOVE BIOGEL PI IND STRL 6.5 (GLOVE) ×1 IMPLANT
GLOVE BIOGEL PI IND STRL 7.0 (GLOVE) ×1 IMPLANT
GLOVE BIOGEL PI IND STRL 8 (GLOVE) ×1 IMPLANT
GLOVE BIOGEL PI INDICATOR 6.5 (GLOVE) ×2
GLOVE BIOGEL PI INDICATOR 7.0 (GLOVE) ×2
GLOVE BIOGEL PI INDICATOR 8 (GLOVE) ×2
GLOVE SURG SS PI 6.5 STRL IVOR (GLOVE) ×3 IMPLANT
GOWN STRL REUS W/TWL LRG LVL3 (GOWN DISPOSABLE) ×9 IMPLANT
HANDPIECE INTERPULSE COAX TIP (DISPOSABLE) ×2
HOLDER FOLEY CATH W/STRAP (MISCELLANEOUS) IMPLANT
IMMOBILIZER KNEE 20 (SOFTGOODS) ×3
IMMOBILIZER KNEE 20 THIGH 36 (SOFTGOODS) ×1 IMPLANT
KIT TURNOVER KIT A (KITS) IMPLANT
MANIFOLD NEPTUNE II (INSTRUMENTS) ×3 IMPLANT
NS IRRIG 1000ML POUR BTL (IV SOLUTION) ×3 IMPLANT
PACK TOTAL KNEE CUSTOM (KITS) ×3 IMPLANT
PADDING CAST COTTON 6X4 STRL (CAST SUPPLIES) ×5 IMPLANT
PENCIL SMOKE EVACUATOR (MISCELLANEOUS) IMPLANT
PIN DRILL FIX HALF THREAD (BIT) ×2 IMPLANT
PIN STEINMAN FIXATION KNEE (PIN) ×2 IMPLANT
PROTECTOR NERVE ULNAR (MISCELLANEOUS) ×3 IMPLANT
SET HNDPC FAN SPRY TIP SCT (DISPOSABLE) ×1 IMPLANT
STRIP CLOSURE SKIN 1/2X4 (GAUZE/BANDAGES/DRESSINGS) ×3 IMPLANT
SUT MNCRL AB 4-0 PS2 18 (SUTURE) ×3 IMPLANT
SUT STRATAFIX 0 PDS 27 VIOLET (SUTURE) ×3
SUT VIC AB 2-0 CT1 27 (SUTURE) ×6
SUT VIC AB 2-0 CT1 TAPERPNT 27 (SUTURE) ×3 IMPLANT
SUTURE STRATFX 0 PDS 27 VIOLET (SUTURE) ×1 IMPLANT
TIBIAL BASE ROT PLAT SZ 7 KNEE (Knees) ×3 IMPLANT
TRAY FOLEY MTR SLVR 16FR STAT (SET/KITS/TRAYS/PACK) ×3 IMPLANT
WATER STERILE IRR 1000ML POUR (IV SOLUTION) ×6 IMPLANT
WRAP KNEE MAXI GEL POST OP (GAUZE/BANDAGES/DRESSINGS) ×3 IMPLANT
YANKAUER SUCT BULB TIP 10FT TU (MISCELLANEOUS) ×3 IMPLANT

## 2019-02-23 NOTE — Progress Notes (Signed)
Patient pain at 7-10 after pain medications  given. [facility] paged Dr Veverly Fells on call, orders received. Bethann Punches RN

## 2019-02-23 NOTE — Discharge Instructions (Addendum)
Dr. Gaynelle Arabian Total Joint Specialist [facility] [address], [place], [place] [address] [phone]  TOTAL KNEE REPLACEMENT POSTOPERATIVE DIRECTIONS  Knee Rehabilitation, Guidelines Following Surgery  Results after knee surgery are often greatly improved when you follow the exercise, range of motion and muscle strengthening exercises prescribed by your doctor. Safety measures are also important to protect the knee from further injury. Any time any of these exercises cause you to have increased pain or swelling in your knee joint, decrease the amount until you are comfortable again and slowly increase them. If you have problems or questions, call your caregiver or physical therapist for advice.   HOME CARE INSTRUCTIONS  . Remove items at home which could result in a fall. This includes throw rugs or furniture in walking pathways.   ICE to the affected knee every three hours for 30 minutes at a time and then as needed for pain and swelling.  Continue to use ice on the knee for pain and swelling from surgery. You may notice swelling that will progress down to the foot and ankle.  This is normal after surgery.  Elevate the leg when you are not up walking on it.    Continue to use the breathing machine which will help keep your temperature down.  It is common for your temperature to cycle up and down following surgery, especially at night when you are not up moving around and exerting yourself.  The breathing machine keeps your lungs expanded and your temperature down.  Do not place pillow under knee, focus on keeping the knee straight while resting  DIET You may resume your previous home diet once your are discharged from the hospital.  DRESSING / WOUND CARE / SHOWERING You may change your dressing 3-5 days after surgery.  Then change the dressing every day with sterile gauze.  Please use good hand washing techniques before changing the dressing.  Do not use any lotions  or creams on the incision until instructed by your surgeon. You may start showering once you are discharged home but do not submerge the incision under water. Just pat the incision dry and apply a dry gauze dressing on daily. Change the surgical dressing daily and reapply a dry dressing each time.  ACTIVITY Walk with your walker as instructed. Use walker as long as suggested by your caregivers. Avoid periods of inactivity such as sitting longer than an hour when not asleep. This helps prevent blood clots.  You may resume a sexual relationship in one month or when given the OK by your doctor.  You may return to work once you are cleared by your doctor.  Do not drive a car for 6 weeks or until released by you surgeon.  Do not drive while taking narcotics.  WEIGHT BEARING Weight bearing as tolerated with assist device (walker, cane, etc) as directed, use it as long as suggested by your surgeon or therapist, typically at least 4-6 weeks.  POSTOPERATIVE CONSTIPATION PROTOCOL Constipation - defined medically as fewer than three stools per week and severe constipation as less than one stool per week.  One of the most common issues patients have following surgery is constipation.  Even if you have a regular bowel pattern at home, your normal regimen is likely to be disrupted due to multiple reasons following surgery.  Combination of anesthesia, postoperative narcotics, change in appetite and fluid intake all can affect your bowels.  In order to avoid complications following surgery, here are some  recommendations in order to help you during your recovery period.  Colace (docusate) - Pick up an over-the-counter form of Colace or another stool softener and take twice a day as long as you are requiring postoperative pain medications.  Take with a full glass of water daily.  If you experience loose stools or diarrhea, hold the colace until you stool forms back up.  If your symptoms do not get better within 1  week or if they get worse, check with your doctor.  Dulcolax (bisacodyl) - Pick up over-the-counter and take as directed by the product packaging as needed to assist with the movement of your bowels.  Take with a full glass of water.  Use this product as needed if not relieved by Colace only.   MiraLax (polyethylene glycol) - Pick up over-the-counter to have on hand.  MiraLax is a solution that will increase the amount of water in your bowels to assist with bowel movements.  Take as directed and can mix with a glass of water, juice, soda, coffee, or tea.  Take if you go more than two days without a movement. Do not use MiraLax more than once per day. Call your doctor if you are still constipated or irregular after using this medication for 7 days in a row.  If you continue to have problems with postoperative constipation, please contact the office for further assistance and recommendations.  If you experience "the worst abdominal pain ever" or develop nausea or vomiting, please contact the office immediatly for further recommendations for treatment.  ITCHING If you experience itching with your medications, try taking only a single pain pill, or even half a pain pill at a time.  You can also use Benadryl over the counter for itching or also to help with sleep.   TED HOSE STOCKINGS Wear the elastic stockings on both legs for three weeks following surgery during the day but you may remove then at night for sleeping.  MEDICATIONS See your medication summary on the "After Visit Summary" that the nursing staff will review with you prior to discharge.  You may have some home medications which will be placed on hold until you complete the course of blood thinner medication.  It is important for you to complete the blood thinner medication as prescribed by your surgeon.  Continue your approved medications as instructed at time of discharge.  Information on my medicine - XARELTO (Rivaroxaban)  Why was  Xarelto prescribed for you? Xarelto was prescribed for you to reduce the risk of blood clots forming after orthopedic surgery. The medical term for these abnormal blood clots is venous thromboembolism (VTE).  What do you need to know about xarelto ? Take your Xarelto ONCE DAILY at the same time every day. You may take it either with or without food.  If you have difficulty swallowing the tablet whole, you may crush it and mix in applesauce just prior to taking your dose.  Take Xarelto exactly as prescribed by your doctor and DO NOT stop taking Xarelto without talking to the doctor who prescribed the medication.  Stopping without other VTE prevention medication to take the place of Xarelto may increase your risk of developing a clot.  After discharge, you should have regular check-up appointments with your healthcare provider that is prescribing your Xarelto.    What do you do if you miss a dose? If you miss a dose, take it as soon as you remember on the same day then continue your regularly  scheduled once daily regimen the next day. Do not take two doses of Xarelto on the same day.   Important Safety Information A possible side effect of Xarelto is bleeding. You should call your healthcare provider right away if you experience any of the following: ? Bleeding from an injury or your nose that does not stop. ? Unusual colored urine (red or dark brown) or unusual colored stools (red or black). ? Unusual bruising for unknown reasons. ? A serious fall or if you hit your head (even if there is no bleeding).  Some medicines may interact with Xarelto and might increase your risk of bleeding while on Xarelto. To help avoid this, consult your healthcare provider or pharmacist prior to using any new prescription or non-prescription medications, including herbals, vitamins, non-steroidal anti-inflammatory drugs (NSAIDs) and supplements.  This website has more information on Xarelto:  [email or web address]-benson.[email or web address]    PRECAUTIONS If you experience chest pain or shortness of breath - call 911 immediately for transfer to the hospital emergency department.  If you develop a fever greater that 101 F, purulent drainage from wound, increased redness or drainage from wound, foul odor from the wound/dressing, or calf pain - CONTACT YOUR SURGEON.                                                   FOLLOW-UP APPOINTMENTS Make sure you keep all of your appointments after your operation with your surgeon and caregivers. You should call the office at the above phone number and make an appointment for approximately two weeks after the date of your surgery or on the date instructed by your surgeon outlined in the "After Visit Summary".  RANGE OF MOTION AND STRENGTHENING EXERCISES  Rehabilitation of the knee is important following a knee injury or an operation. After just a few days of immobilization, the muscles of the thigh which control the knee become weakened and shrink (atrophy). Knee exercises are designed to build up the tone and strength of the thigh muscles and to improve knee motion. Often times heat used for twenty to thirty minutes before working out will loosen up your tissues and help with improving the range of motion but do not use heat for the first two weeks following surgery. These exercises can be done on a training (exercise) mat, on the floor, on a table or on a bed. Use what ever works the best and is most comfortable for you Knee exercises include:  . Leg Lifts - While your knee is still immobilized in a splint or cast, you can do straight leg raises. Lift the leg to 60 degrees, hold for 3 sec, and slowly lower the leg. Repeat 10-20 times 2-3 times daily. Perform this exercise against resistance later as your knee gets better.  Javier Docker and Hamstring Sets - Tighten up the muscle on the front of the thigh (Quad) and hold for 5-10 sec. Repeat this 10-20 times hourly. Hamstring sets are done by  pushing the foot backward against an object and holding for 5-10 sec. Repeat as with quad sets.   Leg Slides: Lying on your back, slowly slide your foot toward your buttocks, bending your knee up off the floor (only go as far as is comfortable). Then slowly slide your foot back down until your leg is flat on the floor again.  Angel Wings: Lying  on your back spread your legs to the side as far apart as you can without causing discomfort.  A rehabilitation program following serious knee injuries can speed recovery and prevent re-injury in the future due to weakened muscles. Contact your doctor or a physical therapist for more information on knee rehabilitation.   IF YOU ARE TRANSFERRED TO A SKILLED REHAB FACILITY If the patient is transferred to a skilled rehab facility following release from the hospital, a list of the current medications will be sent to the facility for the patient to continue.  When discharged from the skilled rehab facility, please have the facility set up the patient's [place] prior to being released. Also, the skilled facility will be responsible for providing the patient with their medications at time of release from the facility to include their pain medication, the muscle relaxants, and their blood thinner medication. If the patient is still at the rehab facility at time of the two week follow up appointment, the skilled rehab facility will also need to assist the patient in arranging follow up appointment in our office and any transportation needs.  MAKE SURE YOU:  . Understand these instructions.  . Get help right away if you are not doing well or get worse.    Pick up stool softner and laxative for home use following surgery while on pain medications. Do not submerge incision under water. Please use good hand washing techniques while changing dressing each day. May shower starting three days after surgery. Please use a clean towel to pat the incision  dry following showers. Continue to use ice for pain and swelling after surgery. Do not use any lotions or creams on the incision until instructed by your surgeon. ___________________________________________

## 2019-02-23 NOTE — Anesthesia Procedure Notes (Signed)
Anesthesia Regional Block: Adductor canal block   Pre-Anesthetic Checklist: ,, timeout performed, Correct Patient, Correct Site, Correct Laterality, Correct Procedure, Correct Position, site marked, Risks and benefits discussed,  Surgical consent,  Pre-op evaluation,  At surgeon's request and post-op pain management  Laterality: Left  Prep: chloraprep       Needles:  Injection technique: Single-shot  Needle Type: Echogenic Stimulator Needle     Needle Length: 10cm  Needle Gauge: 21     Additional Needles:   Procedures:,,,, ultrasound used (permanent image in chart),,,,  Narrative:  Start time: 02/23/2019 12:50 PM End time: 02/23/2019 12:53 PM Injection made incrementally with aspirations every 5 mL.  Performed by: Personally  Anesthesiologist: Lidia Collum, MD  Additional Notes: Monitors applied. Injection made in 5cc increments. No resistance to injection. Good needle visualization. Patient tolerated procedure well.

## 2019-02-23 NOTE — Anesthesia Procedure Notes (Signed)
Spinal  Patient location during procedure: OR Staffing Performed: anesthesiologist  Anesthesiologist: Saivion Goettel E, MD Preanesthetic Checklist Completed: patient identified, IV checked, risks and benefits discussed, surgical consent, monitors and equipment checked, pre-op evaluation and timeout performed Spinal Block Patient position: sitting Prep: DuraPrep and site prepped and draped Patient monitoring: continuous pulse ox, blood pressure and heart rate Approach: midline Location: L3-4 Injection technique: single-shot Needle Needle type: Pencan  Needle gauge: 24 G Needle length: 9 cm Additional Notes Functioning IV was confirmed and monitors were applied. Sterile prep and drape, including hand hygiene and sterile gloves were used. The patient was positioned and the spine was prepped. The skin was anesthetized with lidocaine.  Free flow of clear CSF was obtained prior to injecting local anesthetic into the CSF. The needle was carefully withdrawn. The patient tolerated the procedure well.      

## 2019-02-23 NOTE — Interval H&P Note (Signed)
History and Physical Interval Note:  02/23/2019 12:50 PM  Alexander [name]  has presented today for surgery, with the diagnosis of left knee osteoarthritis.  The various methods of treatment have been discussed with the patient and family. After consideration of risks, benefits and other options for treatment, the patient has consented to  Procedure(s) with comments: TOTAL KNEE ARTHROPLASTY (Left) - 3min as a surgical intervention.  The patient's history has been reviewed, patient examined, no change in status, stable for surgery.  I have reviewed the patient's chart and labs.  Questions were answered to the patient's satisfaction.     Pilar Plate Cheyanne Lamison

## 2019-02-23 NOTE — Care Plan (Signed)
Ortho Bundle Case Management Note  Patient Details  Name: Alexander [name] MRN: [id number] Date of Birth: [birth year]  L TKA on 02-23-19 DCP:  Home with spouse.  2 story home with elevator.  3-4 ste. DME   RW and 3-in-1 ordered through [place] PT:  [facility].  PT eval scheduled on 02-28-19.                DME Arranged:  Walker rolling, 3-N-1 DME Agency:  [employer]  HH Arranged:  NA HH Agency:  NA  Additional Comments: Please contact me with any questions of if this plan should need to change.  Marianne Sofia, RN,CCM [facility]  [phone] 02/23/2019, 1:28 PM

## 2019-02-23 NOTE — Op Note (Signed)
OPERATIVE REPORT-TOTAL KNEE ARTHROPLASTY   Pre-operative diagnosis- Osteoarthritis  Left knee(s)  Post-operative diagnosis- Osteoarthritis Left knee(s)  Procedure-  Left  Total Knee Arthroplasty (Depuy Attune)  Surgeon- Dione Plover. Shakti Fleer, MD  Assistant- Theresa Duty, PA-C   Anesthesia-  Adductor canal block and spinal  EBL-20 mL   Drains Hemovac  Tourniquet time-  Total Tourniquet Time Documented: Thigh (Left) - 47 minutes Total: Thigh (Left) - 47 minutes     Complications- None  Condition-PACU - hemodynamically stable.   Brief Clinical Note   Alexander [name] is a 78 y.o. year old male with end stage OA of his left knee with progressively worsening pain and dysfunction. He has constant pain, with activity and at rest and significant functional deficits with difficulties even with ADLs. He has had extensive non-op management including analgesics, injections of cortisone and viscosupplements, and home exercise program, but remains in significant pain with significant dysfunction. Radiographs show bone on bone arthritis medial and patellofemoral. He presents now for left Total Knee Arthroplasty.    Procedure in detail---   The patient is brought into the operating room and positioned supine on the operating table. After successful administration of  Adductor canal block and spinal,   a tourniquet is placed high on the  Left thigh(s) and the lower extremity is prepped and draped in the usual sterile fashion. Time out is performed by the operating team and then the  Left lower extremity is wrapped in Esmarch, knee flexed and the tourniquet inflated to 300 mmHg.       A midline incision is made with a ten blade through the subcutaneous tissue to the level of the extensor mechanism. A fresh blade is used to make a medial parapatellar arthrotomy. Soft tissue over the proximal medial tibia is subperiosteally elevated to the joint line with a knife and into the semimembranosus bursa  with a Cobb elevator. Soft tissue over the proximal lateral tibia is elevated with attention being paid to avoiding the patellar tendon on the tibial tubercle. The patella is everted, knee flexed 90 degrees and the ACL and PCL are removed. Findings are bone on bone medial and patellofemoral with large global osteophytes.        The drill is used to create a starting hole in the distal femur and the canal is thoroughly irrigated with sterile saline to remove the fatty contents. The 5 degree Left  valgus alignment guide is placed into the femoral canal and the distal femoral cutting block is pinned to remove 9 mm off the distal femur. Resection is made with an oscillating saw.      The tibia is subluxed forward and the menisci are removed. The extramedullary alignment guide is placed referencing proximally at the medial aspect of the tibial tubercle and distally along the second metatarsal axis and tibial crest. The block is pinned to remove 80mm off the more deficient medial  side. Resection is made with an oscillating saw. Size 7is the most appropriate size for the tibia and the proximal tibia is prepared with the modular drill and keel punch for that size.      The femoral sizing guide is placed and size 7 is most appropriate. Rotation is marked off the epicondylar axis and confirmed by creating a rectangular flexion gap at 90 degrees. The size 7 cutting block is pinned in this rotation and the anterior, posterior and chamfer cuts are made with the oscillating saw. The intercondylar block is then placed and that cut  is made.      Trial size 7 tibial component, trial size 7 posterior stabilized femur and a 10  mm posterior stabilized rotating platform insert trial is placed. Full extension is achieved with excellent varus/valgus and anterior/posterior balance throughout full range of motion. The patella is everted and thickness measured to be 27  mm. Free hand resection is taken to 15 mm, a 41 template is placed,  lug holes are drilled, trial patella is placed, and it tracks normally. Osteophytes are removed off the posterior femur with the trial in place. All trials are removed and the cut bone surfaces prepared with pulsatile lavage. Cement is mixed and once ready for implantation, the size 7 tibial implant, size  7 posterior stabilized femoral component, and the size 41 patella are cemented in place and the patella is held with the clamp. The trial insert is placed and the knee held in full extension. The Exparel (20 ml mixed with 60 ml saline) is injected into the extensor mechanism, posterior capsule, medial and lateral gutters and subcutaneous tissues.  All extruded cement is removed and once the cement is hard the permanent 10 mm posterior stabilized rotating platform insert is placed into the tibial tray.      The wound is copiously irrigated with saline solution and the extensor mechanism closed over a hemovac drain with #1 V-loc suture. The tourniquet is released for a total tourniquet time of 43  minutes. Flexion against gravity is 140 degrees and the patella tracks normally. Subcutaneous tissue is closed with 2.0 vicryl and subcuticular with running 4.0 Monocryl. The incision is cleaned and dried and steri-strips and a bulky sterile dressing are applied. The limb is placed into a knee immobilizer and the patient is awakened and transported to recovery in stable condition.      Please note that a surgical assistant was a medical necessity for this procedure in order to perform it in a safe and expeditious manner. Surgical assistant was necessary to retract the ligaments and vital neurovascular structures to prevent injury to them and also necessary for proper positioning of the limb to allow for anatomic placement of the prosthesis.   Dione Plover Lakayla Barrington, MD    02/23/2019, 2:31 PM

## 2019-02-23 NOTE — Transfer of Care (Signed)
Immediate Anesthesia Transfer of Care Note  Patient: Alexander [name]  Procedure(s) Performed: TOTAL KNEE ARTHROPLASTY (Left Knee)  Patient Location: PACU  Anesthesia Type:Spinal  Level of Consciousness: awake and patient cooperative  Airway & Oxygen Therapy: Patient Spontanous Breathing and Patient connected to face mask  Post-op Assessment: Report given to RN and Post -op Vital signs reviewed and stable  Post vital signs: Reviewed and stable  Last Vitals:  Vitals Value Taken Time  BP 114/65 02/23/19 1453  Temp    Pulse 60 02/23/19 1455  Resp 12 02/23/19 1455  SpO2 98 % 02/23/19 1455  Vitals shown include unvalidated device data.  Last Pain:  Vitals:   02/23/19 1256  TempSrc:   PainSc: 0-No pain      Patients Stated Pain Goal: 4 ([id number])  Complications: No apparent anesthesia complications

## 2019-02-23 NOTE — Anesthesia Preprocedure Evaluation (Addendum)
Anesthesia Evaluation  Patient identified by MRN, date of birth, ID band Patient awake    Reviewed: Allergy & Precautions, NPO status , Patient's Chart, lab work & pertinent test results  History of Anesthesia Complications Negative for: history of anesthetic complications  Airway Mallampati: II  TM Distance: >3 FB Neck ROM: Full    Dental   Pulmonary neg pulmonary ROS,    Pulmonary exam normal        Cardiovascular hypertension, Normal cardiovascular exam+ pacemaker      Neuro/Psych negative neurological ROS  negative psych ROS   GI/Hepatic negative GI ROS, Neg liver ROS,   Endo/Other  negative endocrine ROS  Renal/GU negative Renal ROS  negative genitourinary   Musculoskeletal  (+) Arthritis , Osteoarthritis,    Abdominal   Peds  Hematology negative hematology ROS (+)   Anesthesia Other Findings Pt cleared by cardiology 02/16/2019.  Per Kerin Ransom, PA-C, "Pt was evaluated by Dr [name] 02/10/2019 in the office. Given past medical history and time since last visit, based on ACC/AHA guidelines, Alexander [name] would be at acceptable risk for the planned procedure without further cardiovascular testing."  Echo 02/10/19: EF 55-60%, grade 2 dd, mild MR  Reproductive/Obstetrics                            Anesthesia Physical Anesthesia Plan  ASA: II  Anesthesia Plan: Spinal   Post-op Pain Management:    Induction:   PONV Risk Score and Plan: 1 and Propofol infusion, Treatment may vary due to age or medical condition, Ondansetron and TIVA  Airway Management Planned: Nasal Cannula and Simple Face Mask  Additional Equipment: None  Intra-op Plan:   Post-operative Plan:   Informed Consent: I have reviewed the patients History and Physical, chart, labs and discussed the procedure including the risks, benefits and alternatives for the proposed anesthesia with the patient or authorized  representative who has indicated his/her understanding and acceptance.       Plan Discussed with:   Anesthesia Plan Comments:        Anesthesia Quick Evaluation

## 2019-02-23 NOTE — Anesthesia Postprocedure Evaluation (Signed)
Anesthesia Post Note  Patient: Alexander [name]  Procedure(s) Performed: TOTAL KNEE ARTHROPLASTY (Left Knee)     Patient location during evaluation: PACU Anesthesia Type: Spinal Level of consciousness: oriented and awake and alert Pain management: pain level controlled Vital Signs Assessment: post-procedure vital signs reviewed and stable Respiratory status: spontaneous breathing, respiratory function stable and nonlabored ventilation Cardiovascular status: blood pressure returned to baseline and stable Postop Assessment: no headache, no backache, no apparent nausea or vomiting and spinal receding Anesthetic complications: no    Last Vitals:  Vitals:   02/23/19 1530 02/23/19 1545  BP: (!) 97/51 107/75  Pulse: 60 60  Resp: 12 16  Temp:    SpO2: 95% 97%    Last Pain:  Vitals:   02/23/19 1545  TempSrc:   PainSc: 4                  Lidia Collum

## 2019-02-23 NOTE — Progress Notes (Signed)
AssistedDr. Carolyn Witman with left, ultrasound guided, adductor canal block. Side rails up, monitors on throughout procedure. See vital signs in flow sheet. Tolerated Procedure well.  

## 2019-02-23 NOTE — Plan of Care (Signed)
Plan of care 

## 2019-02-24 ENCOUNTER — Encounter

## 2019-02-24 ENCOUNTER — Encounter: Payer: Medicare Other | Admitting: Cardiology

## 2019-02-24 ENCOUNTER — Encounter: Payer: Self-pay | Admitting: *Deleted

## 2019-02-24 LAB — BASIC METABOLIC PANEL
Anion gap: 7 (ref 5–15)
BUN: 17 mg/dL (ref 8–23)
CO2: 28 mmol/L (ref 22–32)
Calcium: 8.5 mg/dL — ABNORMAL LOW (ref 8.9–10.3)
Chloride: 100 mmol/L (ref 98–111)
Creatinine, Ser: 1 mg/dL (ref 0.61–1.24)
GFR calc Af Amer: >60 mL/min (ref >60)
GFR calc non Af Amer: >60 mL/min (ref >60)
Glucose, Bld: 200 mg/dL — ABNORMAL HIGH (ref 70–99)
Potassium: 3.8 mmol/L (ref 3.5–5.1)
Sodium: 135 mmol/L (ref 135–145)

## 2019-02-24 LAB — CBC
HCT: 38.5 % — ABNORMAL LOW (ref 39.0–52.0)
Hemoglobin: 12.5 g/dL — ABNORMAL LOW (ref 13.0–17.0)
MCH: 31.4 pg (ref 26.0–34.0)
MCHC: 32.5 g/dL (ref 30.0–36.0)
MCV: 96.7 fL (ref 80.0–100.0)
Platelets: 156 10*3/uL (ref 150–400)
RBC: 3.98 MIL/uL — ABNORMAL LOW (ref 4.22–5.81)
RDW: 12.6 % (ref 11.5–15.5)
WBC: 11.2 10*3/uL — ABNORMAL HIGH (ref 4.0–10.5)
nRBC: 0 % (ref 0.0–0.2)

## 2019-02-24 MED ORDER — POLYETHYLENE GLYCOL 3350 17 G PO PACK: 17.0000 g | PACK | Freq: Every day | ORAL | 0 refills | Status: DC | PRN

## 2019-02-24 MED ORDER — OXYCODONE HCL 5 MG PO TABS: 5.0000 mg | ORAL_TABLET | Freq: Four times a day (QID) | ORAL | 0 refills | Status: DC | PRN

## 2019-02-24 MED ORDER — DOCUSATE SODIUM 100 MG PO CAPS: 100.0000 mg | ORAL_CAPSULE | Freq: Two times a day (BID) | ORAL | 0 refills | Status: DC

## 2019-02-24 MED ORDER — GABAPENTIN 100 MG PO CAPS: 200.0000 mg | ORAL_CAPSULE | Freq: Three times a day (TID) | ORAL | 0 refills | Status: DC

## 2019-02-24 MED ORDER — ONDANSETRON HCL 4 MG PO TABS: 4.0000 mg | ORAL_TABLET | Freq: Four times a day (QID) | ORAL | 0 refills | Status: DC | PRN

## 2019-02-24 MED ORDER — METHOCARBAMOL 500 MG PO TABS: 500.0000 mg | ORAL_TABLET | Freq: Four times a day (QID) | ORAL | 0 refills | Status: DC | PRN

## 2019-02-24 MED ORDER — RIVAROXABAN 10 MG PO TABS: 10.0000 mg | ORAL_TABLET | Freq: Every day | ORAL | 0 refills | Status: DC

## 2019-02-24 MED ORDER — OXYCODONE HCL 5 MG PO TABS
5.0000 mg | ORAL_TABLET | ORAL | Status: DC | PRN
  Administered 2019-02-24: 10 mg via ORAL
  Administered 2019-02-24 – 2019-02-25 (×7): 15 mg via ORAL
  Administered 2019-02-26 (×3): 5 mg via ORAL
  Filled 2019-02-24 (×4): qty 3
  Filled 2019-02-24: qty 1
  Filled 2019-02-24 (×2): qty 3
  Filled 2019-02-24: qty 1
  Filled 2019-02-24 (×2): qty 3
  Filled 2019-02-24: qty 1

## 2019-02-24 MED ORDER — TRAMADOL HCL 50 MG PO TABS: 50.0000 mg | ORAL_TABLET | Freq: Four times a day (QID) | ORAL | 0 refills | Status: DC | PRN

## 2019-02-24 MED ORDER — SODIUM CHLORIDE 0.9 % IV BOLUS
250.0000 mL | Freq: Once | INTRAVENOUS | Status: AC
  Administered 2019-02-24: 250 mL via INTRAVENOUS

## 2019-02-24 NOTE — Evaluation (Signed)
Physical Therapy Evaluation Patient Details Name: Alexander [name] MRN: [id number] DOB: 1941-01-28 Today's Date: 02/24/2019   History of Present Illness  Pt L TKR  Clinical Impression  Pt s.p L TKR and presents with decreased L LE strength/ROM and post op pain limiting functional mobility.  Pt should progress to dc home with family assist.  Pt reports he does have OP PT appt set up but not sure of the date.    Follow Up Recommendations Follow surgeon's recommendation for DC plan and follow-up therapies    Equipment Recommendations  None recommended by PT    Recommendations for Other Services       Precautions / Restrictions Precautions Precautions: Fall;Knee Required Braces or Orthoses: Knee Immobilizer - Left Knee Immobilizer - Left: Discontinue once straight leg raise with < 10 degree lag Restrictions Weight Bearing Restrictions: No Other Position/Activity Restrictions: WBAT      Mobility  Bed Mobility               General bed mobility comments: Pt up in chair and requests back to same  Transfers Overall transfer level: Needs assistance Equipment used: Rolling walker (2 wheeled) Transfers: Sit to/from Stand Sit to Stand: Min assist         General transfer comment: cues for LE management and use of UEs to self assist.  Assist to bring wt up and fwd and to control descent  Ambulation/Gait Ambulation/Gait assistance: Min assist;Min guard Gait Distance (Feet): 80 Feet Assistive device: Rolling walker (2 wheeled) Gait Pattern/deviations: Step-to pattern;Decreased step length - right;Decreased step length - left;Shuffle;Trunk flexed Gait velocity: decr   General Gait Details: cues for sequence, posture and position from [employer]            Wheelchair Mobility    Modified Rankin (Stroke Patients Only)       Balance Overall balance assessment: Mild deficits observed, not formally tested                                           Pertinent Vitals/Pain Pain Assessment: 0-10 Pain Score: 6  Pain Location: L knee Pain Descriptors / Indicators: Aching;Sore Pain Intervention(s): Limited activity within patient's tolerance;Monitored during session;Premedicated before session;Ice applied    Home Living Family/patient expects to be discharged to:: Private residence Living Arrangements: Spouse/significant other Available Help at Discharge: Family Type of Home: House Home Access: Stairs to enter   Technical brewer of Steps: 1 Home Layout: One level Home Equipment: Environmental consultant - 2 wheels;Cane - single point;Bedside commode      Prior Function Level of Independence: Independent               Hand Dominance        Extremity/Trunk Assessment   Upper Extremity Assessment Upper Extremity Assessment: Overall WFL for tasks assessed    Lower Extremity Assessment Lower Extremity Assessment: LLE deficits/detail LLE Deficits / Details: 3/5 quads with IND SLR; AAROM at knee -8 - 50    Cervical / Trunk Assessment Cervical / Trunk Assessment: Normal  Communication   Communication: No difficulties  Cognition Arousal/Alertness: Awake/alert Behavior During Therapy: WFL for tasks assessed/performed Overall Cognitive Status: Within Functional Limits for tasks assessed  General Comments      Exercises Total Joint Exercises Ankle Circles/Pumps: AROM;Both;15 reps;Supine Quad Sets: AROM;Both;10 reps;Supine Heel Slides: AAROM;Left;15 reps;Supine Straight Leg Raises: AAROM;AROM;Left;10 reps;Supine   Assessment/Plan    PT Assessment Patient needs continued PT services  PT Problem List Decreased strength;Decreased range of motion;Decreased activity tolerance;Decreased mobility;Decreased knowledge of use of DME;Pain       PT Treatment Interventions DME instruction;Gait training;Stair training;Functional mobility training;Therapeutic  activities;Therapeutic exercise;Patient/family education    PT Goals (Current goals can be found in the Care Plan section)  Acute Rehab PT Goals Patient Stated Goal: RegaiN IND PT Goal Formulation: With patient Time For Goal Achievement: 03/03/19 Potential to Achieve Goals: Good    Frequency 7X/week   Barriers to discharge        Co-evaluation               AM-PAC PT "6 Clicks" Mobility  Outcome Measure Help needed turning from your back to your side while in a flat bed without using bedrails?: A Little Help needed moving from lying on your back to sitting on the side of a flat bed without using bedrails?: A Little Help needed moving to and from a bed to a chair (including a wheelchair)?: A Little Help needed standing up from a chair using your arms (e.g., wheelchair or bedside chair)?: A Little Help needed to walk in hospital room?: A Little Help needed climbing 3-5 steps with a railing? : A Little 6 Click Score: 18    End of Session Equipment Utilized During Treatment: Gait belt Activity Tolerance: Patient tolerated treatment well Patient left: in chair;with call bell/phone within reach;with chair alarm set Nurse Communication: Mobility status PT Visit Diagnosis: Difficulty in walking, not elsewhere classified (R26.2)    Time: [id number] PT Time Calculation (min) (ACUTE ONLY): 30 min   Charges:   PT Evaluation $PT Eval Low Complexity: 1 Low PT Treatments $Therapeutic Exercise: 8-22 mins        Debe Coder PT Acute Rehabilitation Services Pager [phone] Office [phone]   Andrey Hoobler 02/24/2019, 12:11 PM

## 2019-02-24 NOTE — Progress Notes (Signed)
   Subjective: 1 Day Post-Op Procedure(s) (LRB): TOTAL KNEE ARTHROPLASTY (Left) Patient reports pain as moderate.   Patient seen in rounds with Dr. Wynelle Link. Patient is well, and has had no acute complaints or problems other than pain in the left knee. No SOB or chest pain. Foley to be removed this morning. Good urine output so far. Positive flatus.   Plan is to go Home after hospital stay.  Objective: Vital signs in last 24 hours: Temp:  [97.4 F (36.3 C)-98.3 F (36.8 C)] 98.3 F (36.8 C) (12/17 0642) Pulse Rate:  [59-71] 63 (12/17 0642) Resp:  [12-18] 16 (12/17 0642) BP: (95-148)/(46-84) 108/55 (12/17 0642) SpO2:  [95 %-100 %] 98 % (12/17 [id number]) Weight:  [77.7 kg-79 kg] 79 kg (12/16 1609)  Intake/Output from previous day:  Intake/Output Summary (Last 24 hours) at 02/24/2019 0751 Last data filed at 02/24/2019 0600 Gross per 24 hour  Intake 3425.53 ml  Output 1755 ml  Net 1670.53 ml     Labs: Recent Labs    02/22/19 1230 02/24/19 0234  HGB 14.8 12.5*   Recent Labs    02/22/19 1230 02/24/19 0234  WBC 7.4 11.2*  RBC 4.54 3.98*  HCT 44.0 38.5*  PLT 172 156   Recent Labs    02/22/19 1230 02/24/19 0234  NA 141 135  K 4.4 3.8  CL 105 100  CO2 27 28  BUN 20 17  CREATININE 0.96 1.00  GLUCOSE 102* 200*  CALCIUM 9.4 8.5*   Recent Labs    02/22/19 1230  INR 0.9    EXAM General - Patient is Alert and Oriented Extremity - Neurologically intact Intact pulses distally Dorsiflexion/Plantar flexion intact Compartment soft Dressing - dressing C/D/I Motor Function - intact, moving foot and toes well on exam.  Hemovac pulled without difficulty.  Past Medical History:  Diagnosis Date  . Hyperlipidemia   . Hypertension   . MVP (mitral valve prolapse)    mild  . Syncope     Assessment/Plan: 1 Day Post-Op Procedure(s) (LRB): TOTAL KNEE ARTHROPLASTY (Left) Principal Problem:   OA (osteoarthritis) of knee Active Problems:   Osteoarthritis of left  knee  Estimated body mass index is 25.72 kg/m as calculated from the following:   Height as of this encounter: 5\' 9"  (1.753 m).   Weight as of this encounter: 79 kg. Advance diet Up with therapy D/C IV fluids when tolerating POs well  DVT Prophylaxis - Xarelto Weight-Bearing as tolerated  D/C O2 and Pulse OX and try on Room Air  Anticipated LOS equal to or greater than 2 midnights due to - Age 20 and older with one or more of the following:  - Obesity  - Expected need for hospital services (PT, OT, Nursing) required for safe  discharge  - Anticipated need for postoperative skilled nursing care or inpatient rehab  - Active co-morbidities: MVP OR   - Unanticipated findings during/Post Surgery: None  - Patient is a high risk of re-admission due to: None  We are going to have him start therapy today. Will adjust oxycodone dosage to 5-15mg . Will try to limit IV pain meds today. Possible DC home today if he is meeting his goals and if pain is better managed with PO meds. Otherwise will stay until tomorrow.   Ardeen Jourdain, PA-C Orthopaedic Surgery 02/24/2019, 7:51 AM

## 2019-02-24 NOTE — Progress Notes (Signed)
RW and 3 in 1 delivered to the patient room by [employer].

## 2019-02-24 NOTE — Progress Notes (Signed)
Physical Therapy Treatment Patient Details Name: Alexander [name] MRN: [id number] DOB: 1941/01/08 Today's Date: 02/24/2019    History of Present Illness Pt L TKR    PT Comments    Pt continues motivated but concerned with pain control having needed IV morphine this morning.  Pt ambulated short distance in halls and then assisted to bed and being placed in CPM by ortho tech.   Follow Up Recommendations  Follow surgeon's recommendation for DC plan and follow-up therapies     Equipment Recommendations  None recommended by PT    Recommendations for Other Services       Precautions / Restrictions Precautions Precautions: Fall;Knee Required Braces or Orthoses: Knee Immobilizer - Left Knee Immobilizer - Left: Discontinue once straight leg raise with < 10 degree lag Restrictions Weight Bearing Restrictions: No Other Position/Activity Restrictions: WBAT    Mobility  Bed Mobility Overal bed mobility: Needs Assistance Bed Mobility: Sit to Supine       Sit to supine: Min assist   General bed mobility comments: cues for sequence and assist to manage L LE  Transfers Overall transfer level: Needs assistance Equipment used: Rolling walker (2 wheeled) Transfers: Sit to/from Stand Sit to Stand: Min assist         General transfer comment: cues for LE management and use of UEs to self assist.  Assist to bring wt up and fwd and to control descent  Ambulation/Gait Ambulation/Gait assistance: Min assist;Min guard Gait Distance (Feet): 70 Feet Assistive device: Rolling walker (2 wheeled) Gait Pattern/deviations: Step-to pattern;Decreased step length - right;Decreased step length - left;Shuffle;Trunk flexed Gait velocity: decr   General Gait Details: cues for sequence, posture and position from [employer]             Wheelchair Mobility    Modified Rankin (Stroke Patients Only)       Balance Overall balance assessment: Mild deficits observed, not formally  tested                                          Cognition Arousal/Alertness: Awake/alert Behavior During Therapy: WFL for tasks assessed/performed Overall Cognitive Status: Within Functional Limits for tasks assessed                                        Exercises Total Joint Exercises Ankle Circles/Pumps: AROM;Both;15 reps;Supine Quad Sets: AROM;Both;10 reps;Supine Heel Slides: AAROM;Left;15 reps;Supine Straight Leg Raises: AAROM;AROM;Left;10 reps;Supine    General Comments        Pertinent Vitals/Pain Pain Assessment: 0-10 Pain Score: 6  Pain Location: L knee Pain Descriptors / Indicators: Aching;Sore Pain Intervention(s): Limited activity within patient's tolerance;Monitored during session;Premedicated before session    [place] expects to be discharged to:: Private residence Living Arrangements: Spouse/significant other Available Help at Discharge: Family Type of Home: House Home Access: Stairs to enter   Home Layout: One level Home Equipment: Environmental consultant - 2 wheels;Cane - single point;Bedside commode      Prior Function Level of Independence: Independent          PT Goals (current goals can now be found in the care plan section) Acute Rehab PT Goals Patient Stated Goal: RegaiN IND PT Goal Formulation: With patient Time For Goal Achievement: 03/03/19 Potential to Achieve Goals: Good Progress towards PT  goals: Progressing toward goals    Frequency    7X/week      PT Plan Current plan remains appropriate    Co-evaluation              AM-PAC PT "6 Clicks" Mobility   Outcome Measure  Help needed turning from your back to your side while in a flat bed without using bedrails?: A Little Help needed moving from lying on your back to sitting on the side of a flat bed without using bedrails?: A Little Help needed moving to and from a bed to a chair (including a wheelchair)?: A Little Help needed  standing up from a chair using your arms (e.g., wheelchair or bedside chair)?: A Little Help needed to walk in hospital room?: A Little Help needed climbing 3-5 steps with a railing? : A Little 6 Click Score: 18    End of Session Equipment Utilized During Treatment: Gait belt Activity Tolerance: Patient tolerated treatment well Patient left: in chair;with call bell/phone within reach;with chair alarm set Nurse Communication: Mobility status PT Visit Diagnosis: Difficulty in walking, not elsewhere classified (R26.2)     Time: [id number] PT Time Calculation (min) (ACUTE ONLY): 15 min  Charges:  $Gait Training: 8-22 mins $Therapeutic Exercise: 8-22 mins                     [place] Pager [phone] Office [phone]    Shirlie Enck 02/24/2019, 3:33 PM

## 2019-02-25 DIAGNOSIS — I10 Essential (primary) hypertension: Secondary | ICD-10-CM | POA: Diagnosis present

## 2019-02-25 DIAGNOSIS — T402X5A Adverse effect of other opioids, initial encounter: Secondary | ICD-10-CM | POA: Diagnosis not present

## 2019-02-25 DIAGNOSIS — Z9181 History of falling: Secondary | ICD-10-CM | POA: Diagnosis not present

## 2019-02-25 DIAGNOSIS — Z79899 Other long term (current) drug therapy: Secondary | ICD-10-CM | POA: Diagnosis not present

## 2019-02-25 DIAGNOSIS — Z91018 Allergy to other foods: Secondary | ICD-10-CM | POA: Diagnosis not present

## 2019-02-25 DIAGNOSIS — Z8249 Family history of ischemic heart disease and other diseases of the circulatory system: Secondary | ICD-10-CM | POA: Diagnosis not present

## 2019-02-25 DIAGNOSIS — I341 Nonrheumatic mitral (valve) prolapse: Secondary | ICD-10-CM | POA: Diagnosis present

## 2019-02-25 DIAGNOSIS — M25762 Osteophyte, left knee: Secondary | ICD-10-CM | POA: Diagnosis present

## 2019-02-25 DIAGNOSIS — Y92230 Patient room in hospital as the place of occurrence of the external cause: Secondary | ICD-10-CM | POA: Diagnosis not present

## 2019-02-25 DIAGNOSIS — G251 Drug-induced tremor: Secondary | ICD-10-CM | POA: Diagnosis not present

## 2019-02-25 DIAGNOSIS — M1712 Unilateral primary osteoarthritis, left knee: Secondary | ICD-10-CM | POA: Diagnosis present

## 2019-02-25 DIAGNOSIS — E785 Hyperlipidemia, unspecified: Secondary | ICD-10-CM | POA: Diagnosis present

## 2019-02-25 LAB — CBC
HCT: 35.1 % — ABNORMAL LOW (ref 39.0–52.0)
Hemoglobin: 11.5 g/dL — ABNORMAL LOW (ref 13.0–17.0)
MCH: 31.9 pg (ref 26.0–34.0)
MCHC: 32.8 g/dL (ref 30.0–36.0)
MCV: 97.5 fL (ref 80.0–100.0)
Platelets: 155 10*3/uL (ref 150–400)
RBC: 3.6 MIL/uL — ABNORMAL LOW (ref 4.22–5.81)
RDW: 12.9 % (ref 11.5–15.5)
WBC: 15.2 10*3/uL — ABNORMAL HIGH (ref 4.0–10.5)
nRBC: 0 % (ref 0.0–0.2)

## 2019-02-25 LAB — BASIC METABOLIC PANEL
Anion gap: 8 (ref 5–15)
BUN: 17 mg/dL (ref 8–23)
CO2: 25 mmol/L (ref 22–32)
Calcium: 8.6 mg/dL — ABNORMAL LOW (ref 8.9–10.3)
Chloride: 103 mmol/L (ref 98–111)
Creatinine, Ser: 0.85 mg/dL (ref 0.61–1.24)
GFR calc Af Amer: >60 mL/min (ref >60)
GFR calc non Af Amer: >60 mL/min (ref >60)
Glucose, Bld: 137 mg/dL — ABNORMAL HIGH (ref 70–99)
Potassium: 4 mmol/L (ref 3.5–5.1)
Sodium: 136 mmol/L (ref 135–145)

## 2019-02-25 NOTE — Plan of Care (Signed)
  Problem: Health Behavior/Discharge Planning: Goal: Ability to manage health-related needs will improve Outcome: Progressing   Problem: Clinical Measurements: Goal: Ability to maintain clinical measurements within normal limits will improve Outcome: Progressing Goal: Will remain free from infection Outcome: Progressing Goal: Diagnostic test results will improve Outcome: Progressing Goal: Respiratory complications will improve Outcome: Progressing Goal: Cardiovascular complication will be avoided Outcome: Progressing   Problem: Activity: Goal: Risk for activity intolerance will decrease Outcome: Progressing   Problem: Coping: Goal: Level of anxiety will decrease Outcome: Progressing   Problem: Nutrition: Goal: Adequate nutrition will be maintained Outcome: Progressing   Problem: Elimination: Goal: Will not experience complications related to bowel motility Outcome: Progressing Goal: Will not experience complications related to urinary retention Outcome: Progressing   Problem: Pain Managment: Goal: General experience of comfort will improve Outcome: Progressing   Problem: Safety: Goal: Ability to remain free from injury will improve Outcome: Progressing   Problem: Education: Goal: Knowledge of the prescribed therapeutic regimen will improve Outcome: Progressing Goal: Individualized Educational Video(s) Outcome: Progressing   Problem: Activity: Goal: Ability to avoid complications of mobility impairment will improve Outcome: Progressing Goal: Range of joint motion will improve Outcome: Progressing   Problem: Clinical Measurements: Goal: Postoperative complications will be avoided or minimized Outcome: Progressing   Problem: Pain Management: Goal: Pain level will decrease with appropriate interventions Outcome: Progressing   Problem: Skin Integrity: Goal: Will show signs of wound healing Outcome: Progressing   Problem: Skin Integrity: Goal: Will show  signs of wound healing Outcome: Progressing

## 2019-02-25 NOTE — Progress Notes (Signed)
  Alexander [name] notified due to unsuccessfully completing therapy goals. Informed PA- Patient Has had multiple falls in past- making him a fall risk. Therapy recommended staying another night. PA agreed and wrote orders. PA acknowledges pain issues and gave verbal order for dilaudid for 4 mg q3 hours if other oral agents do not subside pain, order to not give Iv pain medications.

## 2019-02-25 NOTE — Progress Notes (Signed)
   Subjective: 2 Days Post-Op Procedure(s) (LRB): TOTAL KNEE ARTHROPLASTY (Left) Patient reports pain as mild.   Patient seen in rounds for Dr. Wynelle Link. Patient is well, and has had no acute complaints or problems other than discomfort in the left knee. No acute events overnight. He ambulated 70 feet with PT yesterday. Voiding without difficulty, positive flatus. Denies CP, SHOB. Patient states he is ready to go home today.  We will continue therapy today.   Objective: Vital signs in last 24 hours: Temp:  [97.8 F (36.6 C)-98.5 F (36.9 C)] 98.5 F (36.9 C) (12/18 0534) Pulse Rate:  [61-73] 73 (12/18 0534) Resp:  [16] 16 (12/18 0534) BP: (103-121)/(61-70) 121/61 (12/18 0534) SpO2:  [96 %-100 %] 96 % (12/18 0534)  Intake/Output from previous day:  Intake/Output Summary (Last 24 hours) at 02/25/2019 0852 Last data filed at 02/25/2019 0600 Gross per 24 hour  Intake 1737.5 ml  Output 2050 ml  Net -312.5 ml     Intake/Output this shift: No intake/output data recorded.  Labs: Recent Labs    02/22/19 1230 02/24/19 0234 02/25/19 0210  HGB 14.8 12.5* 11.5*   Recent Labs    02/24/19 0234 02/25/19 0210  WBC 11.2* 15.2*  RBC 3.98* 3.60*  HCT 38.5* 35.1*  PLT 156 155   Recent Labs    02/24/19 0234 02/25/19 0210  NA 135 136  K 3.8 4.0  CL 100 103  CO2 28 25  BUN 17 17  CREATININE 1.00 0.85  GLUCOSE 200* 137*  CALCIUM 8.5* 8.6*   Recent Labs    02/22/19 1230  INR 0.9    Exam: General - Patient is Alert and Oriented Extremity - Neurologically intact Sensation intact distally Intact pulses distally Dorsiflexion/Plantar flexion intact Dressing - dressing C/D/I Motor Function - intact, moving foot and toes well on exam.   Past Medical History:  Diagnosis Date  . Hyperlipidemia   . Hypertension   . MVP (mitral valve prolapse)    mild  . Syncope     Assessment/Plan: 2 Days Post-Op Procedure(s) (LRB): TOTAL KNEE ARTHROPLASTY (Left) Principal  Problem:   OA (osteoarthritis) of knee Active Problems:   Osteoarthritis of left knee  Estimated body mass index is 25.72 kg/m as calculated from the following:   Height as of this encounter: 5\' 9"  (1.753 m).   Weight as of this encounter: 79 kg. Advance diet Up with therapy D/C IV fluids   Patient's anticipated LOS is less than 2 midnights, meeting these requirements: - Lives within 1 hour of care - Has a competent adult at home to recover with post-op recover - NO history of  - Chronic pain requiring opiods  - Diabetes  - Coronary Artery Disease  - Heart failure  - Heart attack  - Stroke  - DVT/VTE  - Cardiac arrhythmia  - Respiratory Failure/COPD  - Renal failure  - Anemia  - Advanced Liver disease  DVT Prophylaxis - Xarelto Weight bearing as tolerated. D/C O2 and pulse ox and try on room air.  Plan is to go Home after hospital stay. Plan for discharge today following 2 sessions of therapy as long as he continues to meet his goals. Prescriptions have been sent to his pharmacy. He is scheduled for OPPT. He will follow up in the office with Dr. Wynelle Link in 2 weeks.   Griffith Citron, PA-C Orthopedic Surgery [phone] 02/25/2019, 8:52 AM

## 2019-02-25 NOTE — Progress Notes (Signed)
Pt admitted to get up without staff and walking independently in Room with his walker, but without Knee immobilizer. Patient was told to not get up with out help and bed and chair beds are now being utilized.   Wife spoke about concerns about patient being slow to process, not just now but in the past few months and is concerned of him going home and falling.

## 2019-02-25 NOTE — Progress Notes (Signed)
Physical Therapy Treatment Patient Details Name: Alexander [name] MRN: [id number] DOB: Sep 30, 1940 Today's Date: 02/25/2019    History of Present Illness Pt L TKR    PT Comments    Pt continues motivated but progressing slowly with mobility.  Pt continues to demonstrate initial instability with standing/ambulating and requires min assist/min guard for safe performance of all mobility tasks.   Follow Up Recommendations  Follow surgeon's recommendation for DC plan and follow-up therapies     Equipment Recommendations  None recommended by PT    Recommendations for Other Services       Precautions / Restrictions Precautions Precautions: Fall;Knee Required Braces or Orthoses: Knee Immobilizer - Left Knee Immobilizer - Left: Discontinue once straight leg raise with < 10 degree lag Restrictions Weight Bearing Restrictions: No Other Position/Activity Restrictions: WBAT    Mobility  Bed Mobility Overal bed mobility: Needs Assistance Bed Mobility: Supine to Sit     Supine to sit: Min guard     General bed mobility comments: cues for sequence, pt self assisting L LE with belt, increased time and use of bed rail  Transfers Overall transfer level: Needs assistance Equipment used: Rolling walker (2 wheeled) Transfers: Sit to/from Stand Sit to Stand: Min assist         General transfer comment: cues for LE management and use of UEs to self assist.  Assist to bring wt up and fwd and to control descent  Ambulation/Gait Ambulation/Gait assistance: Min assist;Min guard Gait Distance (Feet): 75 Feet Assistive device: Rolling walker (2 wheeled) Gait Pattern/deviations: Step-to pattern;Decreased step length - right;Decreased step length - left;Shuffle;Trunk flexed Gait velocity: decr   General Gait Details: cues for sequence, posture and position from RW; initial instability with pt stating "I'm shaky, I don't know what thats about".  Improved with increased distance  ambulated   Stairs Stairs: Yes Stairs assistance: Min assist Stair Management: No rails;Step to pattern;Forwards;Backwards;With walker Number of Stairs: 3 General stair comments: single step x 3 - once fwd and twice bkwd.  Cues for sequence and foot/RW placement.   Wheelchair Mobility    Modified Rankin (Stroke Patients Only)       Balance Overall balance assessment: Mild deficits observed, not formally tested                                          Cognition Arousal/Alertness: Awake/alert Behavior During Therapy: WFL for tasks assessed/performed Overall Cognitive Status: Within Functional Limits for tasks assessed                                        Exercises      General Comments        Pertinent Vitals/Pain Pain Assessment: 0-10 Pain Score: 5  Pain Location: L knee Pain Descriptors / Indicators: Aching;Sore Pain Intervention(s): Limited activity within patient's tolerance;Monitored during session;Premedicated before session;Ice applied    Home Living                      Prior Function            PT Goals (current goals can now be found in the care plan section) Acute Rehab PT Goals Patient Stated Goal: Regain IND PT Goal Formulation: With patient Time For Goal Achievement: 03/03/19 Potential to Achieve Goals:  Good Progress towards PT goals: Progressing toward goals    Frequency    7X/week      PT Plan Current plan remains appropriate    Co-evaluation              AM-PAC PT "6 Clicks" Mobility   Outcome Measure  Help needed turning from your back to your side while in a flat bed without using bedrails?: A Little Help needed moving from lying on your back to sitting on the side of a flat bed without using bedrails?: A Little Help needed moving to and from a bed to a chair (including a wheelchair)?: A Little Help needed standing up from a chair using your arms (e.g., wheelchair or bedside  chair)?: A Little Help needed to walk in hospital room?: A Little Help needed climbing 3-5 steps with a railing? : A Little 6 Click Score: 18    End of Session Equipment Utilized During Treatment: Gait belt;Left knee immobilizer Activity Tolerance: Patient tolerated treatment well Patient left: in chair;with call bell/phone within reach;with chair alarm set Nurse Communication: Mobility status PT Visit Diagnosis: Difficulty in walking, not elsewhere classified (R26.2)     Time: [id number] PT Time Calculation (min) (ACUTE ONLY): 32 min  Charges:  $Gait Training: 8-22 mins $Therapeutic Activity: 8-22 mins                     [place] Pager [phone] Office [phone]    Bayron Dalto 02/25/2019, 8:55 AM

## 2019-02-25 NOTE — Progress Notes (Signed)
Physical Therapy Treatment Patient Details Name: Alexander [name] MRN: [id number] DOB: 12/03/1940 Today's Date: 02/25/2019    History of Present Illness Pt L TKR    PT Comments    Pt continues cooperative but with notable decreasing activity tolerance, intermittent instability with ambulation placing him at higher fall risk and slower processing of basic cues.  Pt's spouse present and remarks that This is "not how he is, maybe its the medicine".  Follow Up Recommendations  Follow surgeon's recommendation for DC plan and follow-up therapies     Equipment Recommendations  None recommended by PT    Recommendations for Other Services       Precautions / Restrictions Precautions Precautions: Fall;Knee Required Braces or Orthoses: Knee Immobilizer - Left Knee Immobilizer - Left: Discontinue once straight leg raise with < 10 degree lag Restrictions Weight Bearing Restrictions: No Other Position/Activity Restrictions: WBAT    Mobility  Bed Mobility Overal bed mobility: Needs Assistance Bed Mobility: Sit to Supine       Sit to supine: Min assist   General bed mobility comments: Increased time with cues for sequence and assist to manage L LE  Transfers Overall transfer level: Needs assistance Equipment used: Rolling walker (2 wheeled) Transfers: Sit to/from Stand Sit to Stand: Min assist         General transfer comment: cues for LE management and use of UEs to self assist.  Assist to bring wt up and fwd, to balance in initial standing and to control descent  Ambulation/Gait Ambulation/Gait assistance: Min assist Gait Distance (Feet): 75 Feet Assistive device: Rolling walker (2 wheeled)   Gait velocity: decr   General Gait Details: Increased time with cues for sequence, posture and position from RW; intermittent instability with pt requiring steady assist.   Stairs Stairs: Yes Stairs assistance: Min assist Stair Management: No rails;Step to  pattern;Backwards;With walker Number of Stairs: 1 General stair comments: cues for sequence and foot/RW placement and assist for balance   Wheelchair Mobility    Modified Rankin (Stroke Patients Only)       Balance Overall balance assessment: Needs assistance Sitting-balance support: No upper extremity supported;Feet supported Sitting balance-Leahy Scale: Good     Standing balance support: No upper extremity supported Standing balance-Leahy Scale: Poor                              Cognition Arousal/Alertness: Awake/alert Behavior During Therapy: WFL for tasks assessed/performed Overall Cognitive Status: Impaired/Different from baseline Area of Impairment: Following commands;Safety/judgement                       Following Commands: Follows one step commands inconsistently;Follows one step commands with increased time Safety/Judgement: Decreased awareness of safety;Decreased awareness of deficits     General Comments: Pt very cooperative but with increased difficulty processing basic cues.  Pt's wife present and noted pt "not how he is"      Exercises      General Comments        Pertinent Vitals/Pain Pain Assessment: 0-10 Pain Score: 6  Pain Location: L knee Pain Descriptors / Indicators: Aching;Sore Pain Intervention(s): Limited activity within patient's tolerance;Monitored during session;Premedicated before session;Ice applied    Home Living                      Prior Function            PT Goals (current goals  can now be found in the care plan section) Acute Rehab PT Goals Patient Stated Goal: Regain IND PT Goal Formulation: With patient Time For Goal Achievement: 03/03/19 Potential to Achieve Goals: Good Progress towards PT goals: Not progressing toward goals - comment(fatigue, slower processing of cues, )    Frequency    7X/week      PT Plan Current plan remains appropriate    Co-evaluation               AM-PAC PT "6 Clicks" Mobility   Outcome Measure  Help needed turning from your back to your side while in a flat bed without using bedrails?: A Little Help needed moving from lying on your back to sitting on the side of a flat bed without using bedrails?: A Little Help needed moving to and from a bed to a chair (including a wheelchair)?: A Little Help needed standing up from a chair using your arms (e.g., wheelchair or bedside chair)?: A Little Help needed to walk in hospital room?: A Little Help needed climbing 3-5 steps with a railing? : A Little 6 Click Score: 18    End of Session Equipment Utilized During Treatment: Gait belt;Left knee immobilizer Activity Tolerance: Patient limited by fatigue Patient left: in bed;with call bell/phone within reach;with nursing/sitter in room Nurse Communication: Mobility status PT Visit Diagnosis: Difficulty in walking, not elsewhere classified (R26.2)     Time: [id number] PT Time Calculation (min) (ACUTE ONLY): 37 min  Charges:  $Gait Training: 8-22 mins $Therapeutic Activity: 8-22 mins                     02/25/2019  Debe Coder PT Acute Rehabilitation Services Pager [phone] Office [phone]    [place] 02/25/2019, 5:49 PM

## 2019-02-25 NOTE — Progress Notes (Signed)
Patient did not progress to meet all of his goals with therapy today. He will stay overnight, and continue working with PT tomorrow with plans for d/c home tomorrow.

## 2019-02-25 NOTE — Progress Notes (Signed)
Physical Therapy Treatment Patient Details Name: Alexander [name] MRN: [id number] DOB: Aug 07, 1940 Today's Date: 02/25/2019    History of Present Illness Pt L TKR    PT Comments    Home therex program performed with assist.  Written instruction provided.   Follow Up Recommendations  Follow surgeon's recommendation for DC plan and follow-up therapies     Equipment Recommendations  None recommended by PT    Recommendations for Other Services       Precautions / Restrictions Precautions Precautions: Fall;Knee Required Braces or Orthoses: Knee Immobilizer - Left Knee Immobilizer - Left: Discontinue once straight leg raise with < 10 degree lag Restrictions Weight Bearing Restrictions: No Other Position/Activity Restrictions: WBAT    Mobility  Bed Mobility Overal bed mobility: Needs Assistance Bed Mobility: Supine to Sit     Supine to sit: Min guard     General bed mobility comments: cues for sequence, pt self assisting L LE with belt, increased time and use of bed rail  Transfers Overall transfer level: Needs assistance Equipment used: Rolling walker (2 wheeled) Transfers: Sit to/from Stand Sit to Stand: Min assist         General transfer comment: cues for LE management and use of UEs to self assist.  Assist to bring wt up and fwd and to control descent  Ambulation/Gait Ambulation/Gait assistance: Min assist;Min guard Gait Distance (Feet): 75 Feet Assistive device: Rolling walker (2 wheeled) Gait Pattern/deviations: Step-to pattern;Decreased step length - right;Decreased step length - left;Shuffle;Trunk flexed Gait velocity: decr   General Gait Details: cues for sequence, posture and position from RW; initial instability with pt stating "I'm shaky, I don't know what thats about".  Improved with increased distance ambulated   Stairs Stairs: Yes Stairs assistance: Min assist Stair Management: No rails;Step to pattern;Forwards;Backwards;With walker Number  of Stairs: 3 General stair comments: single step x 3 - once fwd and twice bkwd.  Cues for sequence and foot/RW placement.   Wheelchair Mobility    Modified Rankin (Stroke Patients Only)       Balance Overall balance assessment: Mild deficits observed, not formally tested                                          Cognition Arousal/Alertness: Awake/alert Behavior During Therapy: WFL for tasks assessed/performed Overall Cognitive Status: Within Functional Limits for tasks assessed                                        Exercises Total Joint Exercises Ankle Circles/Pumps: AROM;Both;15 reps;Supine Quad Sets: AROM;Both;10 reps;Supine Short Arc Quad: AAROM;Left;5 reps;Supine Heel Slides: AAROM;Left;15 reps;Supine Straight Leg Raises: AAROM;Left;Supine;15 reps Goniometric ROM: AAROM L knee -5 - 65    General Comments        Pertinent Vitals/Pain Pain Assessment: 0-10 Pain Score: 4  Pain Location: L knee Pain Descriptors / Indicators: Aching;Sore Pain Intervention(s): Limited activity within patient's tolerance;Monitored during session;Premedicated before session;Ice applied    Home Living                      Prior Function            PT Goals (current goals can now be found in the care plan section) Acute Rehab PT Goals Patient Stated Goal: Regain IND PT Goal  Formulation: With patient Time For Goal Achievement: 03/03/19 Potential to Achieve Goals: Good Progress towards PT goals: Progressing toward goals    Frequency    7X/week      PT Plan Current plan remains appropriate    Co-evaluation              AM-PAC PT "6 Clicks" Mobility   Outcome Measure  Help needed turning from your back to your side while in a flat bed without using bedrails?: A Little Help needed moving from lying on your back to sitting on the side of a flat bed without using bedrails?: A Little Help needed moving to and from a bed to a  chair (including a wheelchair)?: A Little Help needed standing up from a chair using your arms (e.g., wheelchair or bedside chair)?: A Little Help needed to walk in hospital room?: A Little Help needed climbing 3-5 steps with a railing? : A Little 6 Click Score: 18    End of Session Equipment Utilized During Treatment: Gait belt;Left knee immobilizer Activity Tolerance: Patient tolerated treatment well Patient left: in chair;with call bell/phone within reach;with chair alarm set Nurse Communication: Mobility status PT Visit Diagnosis: Difficulty in walking, not elsewhere classified (R26.2)     Time: [id number] PT Time Calculation (min) (ACUTE ONLY): 28 min  Charges:  $Gait Training: 8-22 mins $Therapeutic Exercise: 23-37 mins $Therapeutic Activity: 8-22 mins                     Debe [name] PT Acute Rehabilitation Services Pager [phone] Office [phone]    Alexander [name] 02/25/2019, 11:49 AM

## 2019-02-26 LAB — CBC
HCT: 33.7 % — ABNORMAL LOW (ref 39.0–52.0)
Hemoglobin: 11 g/dL — ABNORMAL LOW (ref 13.0–17.0)
MCH: 32 pg (ref 26.0–34.0)
MCHC: 32.6 g/dL (ref 30.0–36.0)
MCV: 98 fL (ref 80.0–100.0)
Platelets: 144 10*3/uL — ABNORMAL LOW (ref 150–400)
RBC: 3.44 MIL/uL — ABNORMAL LOW (ref 4.22–5.81)
RDW: 13.2 % (ref 11.5–15.5)
WBC: 11.5 10*3/uL — ABNORMAL HIGH (ref 4.0–10.5)
nRBC: 0 % (ref 0.0–0.2)

## 2019-02-26 MED ORDER — POLYETHYLENE GLYCOL 3350 17 G PO PACK
17.0000 g | PACK | Freq: Every day | ORAL | Status: DC
  Administered 2019-02-27: 17 g via ORAL
  Filled 2019-02-26: qty 1

## 2019-02-26 NOTE — Progress Notes (Signed)
Physical Therapy Treatment Patient Details Name: Alexander [name] MRN: [id number] DOB: 10-27-1940 Today's Date: 02/26/2019    History of Present Illness Pt L TKR    PT Comments    Pt cooperative and with noted improvement in processing ability, activity tolerance and stability but not to baseline.  Pt spouse on phone relieved at improvement.   Follow Up Recommendations  Follow surgeon's recommendation for DC plan and follow-up therapies     Equipment Recommendations  None recommended by PT    Recommendations for Other Services       Precautions / Restrictions Precautions Precautions: Fall;Knee Required Braces or Orthoses: Knee Immobilizer - Left Knee Immobilizer - Left: Discontinue once straight leg raise with < 10 degree lag Restrictions Weight Bearing Restrictions: No Other Position/Activity Restrictions: WBAT    Mobility  Bed Mobility Overal bed mobility: Needs Assistance Bed Mobility: Supine to Sit     Supine to sit: Min assist     General bed mobility comments: cues for sequence and min assist to manage L LE  Transfers Overall transfer level: Needs assistance Equipment used: Rolling walker (2 wheeled) Transfers: Sit to/from Stand Sit to Stand: Min assist         General transfer comment: cues for LE management and use of UEs to self assist.  Assist to bring wt up and fwd, to balance in initial standing and to control descent  Ambulation/Gait Ambulation/Gait assistance: Min assist;Min guard Gait Distance (Feet): 120 Feet Assistive device: Rolling walker (2 wheeled) Gait Pattern/deviations: Step-to pattern;Decreased step length - right;Decreased step length - left;Shuffle;Trunk flexed Gait velocity: decr   General Gait Details: Increased time with cues for sequence, posture and position from RW; intermittent instability with pt requiring steady assist.   Stairs             Wheelchair Mobility    Modified Rankin (Stroke Patients Only)        Balance Overall balance assessment: Needs assistance Sitting-balance support: No upper extremity supported;Feet supported Sitting balance-Leahy Scale: Good     Standing balance support: No upper extremity supported Standing balance-Leahy Scale: Poor                              Cognition Arousal/Alertness: Awake/alert Behavior During Therapy: WFL for tasks assessed/performed Overall Cognitive Status: Within Functional Limits for tasks assessed Area of Impairment: Following commands;Safety/judgement                       Following Commands: Follows one step commands with increased time;Follows one step commands consistently Safety/Judgement: Decreased awareness of safety;Decreased awareness of deficits     General Comments: Improvement from yesterday but not at baseline      Exercises Total Joint Exercises Ankle Circles/Pumps: AROM;Both;15 reps;Supine Quad Sets: AROM;Both;10 reps;Supine Heel Slides: AAROM;Left;15 reps;Supine Straight Leg Raises: AAROM;Left;Supine;15 reps Goniometric ROM: AAROM L knee -5 - 45    General Comments        Pertinent Vitals/Pain Pain Assessment: 0-10 Pain Score: 5  Pain Location: L knee Pain Descriptors / Indicators: Aching;Sore Pain Intervention(s): Limited activity within patient's tolerance;Monitored during session;Premedicated before session;Ice applied    Home Living                      Prior Function            PT Goals (current goals can now be found in the care plan section) Acute Rehab  PT Goals Patient Stated Goal: Regain IND PT Goal Formulation: With patient Time For Goal Achievement: 03/03/19 Potential to Achieve Goals: Good Progress towards PT goals: Not progressing toward goals - comment    Frequency    7X/week      PT Plan Current plan remains appropriate    Co-evaluation              AM-PAC PT "6 Clicks" Mobility   Outcome Measure  Help needed turning from  your back to your side while in a flat bed without using bedrails?: A Little Help needed moving from lying on your back to sitting on the side of a flat bed without using bedrails?: A Little Help needed moving to and from a bed to a chair (including a wheelchair)?: A Little Help needed standing up from a chair using your arms (e.g., wheelchair or bedside chair)?: A Little Help needed to walk in hospital room?: A Little Help needed climbing 3-5 steps with a railing? : A Little 6 Click Score: 18    End of Session Equipment Utilized During Treatment: Gait belt;Left knee immobilizer Activity Tolerance: Patient limited by fatigue Patient left: in chair;with call bell/phone within reach;with chair alarm set Nurse Communication: Mobility status PT Visit Diagnosis: Difficulty in walking, not elsewhere classified (R26.2)     Time: 1040-1130 PT Time Calculation (min) (ACUTE ONLY): 50 min  Charges:  $Gait Training: 8-22 mins $Therapeutic Exercise: 8-22 mins $Therapeutic Activity: 8-22 mins                     Debe Coder PT Acute Rehabilitation Services Pager [phone] Office [phone]    Alexander [name] 02/26/2019, 1:15 PM

## 2019-02-26 NOTE — Progress Notes (Signed)
Subjective: 3 Days Post-Op Procedure(s) (LRB): TOTAL KNEE ARTHROPLASTY (Left) Patient reports pain as mild.   Patient was seen around for Dr. Wynelle Link Patient is doing well, he has no complaints or concerns at this time.  He had no events overnight.  He was able to get up with physical therapy yesterday, but having some difficulty with ambulation, making him a higher fall risk. Denies chest pain, shortness of breath, voiding without any difficulty, positive flatus. Patient is not comfortable going home at this time due to his inability to continue ambulating for longer distances.  Objective: Vital signs in last 24 hours: Temp:  [98 F (36.7 C)-98.5 F (36.9 C)] 98.5 F (36.9 C) (12/19 0641) Pulse Rate:  [63-80] 79 (12/19 0641) Resp:  [16] 16 (12/19 0641) BP: (133-140)/(68-73) 133/68 (12/19 0641) SpO2:  [96 %-100 %] 96 % (12/19 0641)  Intake/Output from previous day: 12/18 0701 - 12/19 0700 In: 930 [P.O.:930] Out: 2795 [Urine:2795] Intake/Output this shift: Total I/O In: 240 [P.O.:240] Out: 300 [Urine:300]  Recent Labs    02/24/19 0234 02/25/19 0210 02/26/19 0151  HGB 12.5* 11.5* 11.0*   Recent Labs    02/25/19 0210 02/26/19 0151  WBC 15.2* 11.5*  RBC 3.60* 3.44*  HCT 35.1* 33.7*  PLT 155 144*   Recent Labs    02/24/19 0234 02/25/19 0210  NA 135 136  K 3.8 4.0  CL 100 103  CO2 28 25  BUN 17 17  CREATININE 1.00 0.85  GLUCOSE 200* 137*  CALCIUM 8.5* 8.6*   No results for input(s): LABPT, INR in the last 72 hours.  Neurologically intact ABD soft Neurovascular intact Sensation intact distally Dorsiflexion/Plantar flexion intact Incision: dressing C/D/I Compartment soft  Patient is able to dorsiflex and plantar flex foot and move toes with no pain on exam   Assessment/Plan: 3 Days Post-Op Procedure(s) (LRB): TOTAL KNEE ARTHROPLASTY (Left) Advance diet Up with therapy Plan for discharge tomorrow Patient is still having some difficulty with movement  so will need to work with PT today. Possible D/C home tomorrow DVT prophylaxis is Xarelto WBAT Dilaudid was given to patient for pain, but pt developed a tremor so that has been stopped. He is still taking Oxycodone for pain     Drue Novel, PA-C Orthopedic Surgery [id number] 02/26/2019, 9:09 AM

## 2019-02-26 NOTE — Progress Notes (Signed)
Pt continues to have visible hand tremors and spasms intermittently. These seem to be more present after Nuerontin in given. Rn will continue to monitor this finding.

## 2019-02-26 NOTE — Progress Notes (Signed)
Physical Therapy Treatment Patient Details Name: Alexander [name] MRN: [id number] DOB: 21-Jan-1941 Today's Date: 02/26/2019    History of Present Illness Pt L TKR    PT Comments    Pt continues to require min assist for most mobility tasks with initial instability with ambulation requiring min assist to steady but progressed to ambulating with RW and close supervision.  Pt agreeable to attempting CPM this pm.   Follow Up Recommendations  Follow surgeon's recommendation for DC plan and follow-up therapies     Equipment Recommendations  None recommended by PT    Recommendations for Other Services       Precautions / Restrictions Precautions Precautions: Fall;Knee Required Braces or Orthoses: Knee Immobilizer - Left Knee Immobilizer - Left: Discontinue once straight leg raise with < 10 degree lag Restrictions Weight Bearing Restrictions: No Other Position/Activity Restrictions: WBAT    Mobility  Bed Mobility Overal bed mobility: Needs Assistance Bed Mobility: Supine to Sit;Sit to Supine     Supine to sit: Min guard Sit to supine: Min assist   General bed mobility comments: cues for sequence and use of belt to assist L LE over EOB.  MIn assist with L LE onto bed  Transfers Overall transfer level: Needs assistance Equipment used: Rolling walker (2 wheeled) Transfers: Sit to/from Stand Sit to Stand: Min assist         General transfer comment: cues for LE management and use of UEs to self assist.  Assist to bring wt up and fwd, to balance in initial standing and to control descent  Ambulation/Gait Ambulation/Gait assistance: Min assist;Min guard;Supervision Gait Distance (Feet): 150 Feet Assistive device: Rolling walker (2 wheeled) Gait Pattern/deviations: Step-to pattern;Decreased step length - right;Decreased step length - left;Shuffle;Trunk flexed Gait velocity: decr   General Gait Details: Increased time with cues for sequence, posture and position from RW;  initial instability but pt progressed to ambulating with close supervision   Stairs             Wheelchair Mobility    Modified Rankin (Stroke Patients Only)       Balance Overall balance assessment: Needs assistance Sitting-balance support: No upper extremity supported;Feet supported Sitting balance-Leahy Scale: Good     Standing balance support: No upper extremity supported Standing balance-Leahy Scale: Poor                              Cognition Arousal/Alertness: Awake/alert Behavior During Therapy: WFL for tasks assessed/performed Overall Cognitive Status: Within Functional Limits for tasks assessed Area of Impairment: Following commands;Safety/judgement                       Following Commands: Follows one step commands with increased time;Follows one step commands consistently Safety/Judgement: Decreased awareness of safety;Decreased awareness of deficits     General Comments: Improvement from yesterday but not at baseline      Exercises      General Comments        Pertinent Vitals/Pain Pain Assessment: 0-10 Pain Score: 4  Pain Location: L knee Pain Descriptors / Indicators: Aching;Sore Pain Intervention(s): Limited activity within patient's tolerance;Monitored during session;Premedicated before session;Ice applied    Home Living                      Prior Function            PT Goals (current goals can now be found in the  care plan section) Acute Rehab PT Goals Patient Stated Goal: Regain IND PT Goal Formulation: With patient Time For Goal Achievement: 03/03/19 Potential to Achieve Goals: Good Progress towards PT goals: Progressing toward goals    Frequency    7X/week      PT Plan Current plan remains appropriate    Co-evaluation              AM-PAC PT "6 Clicks" Mobility   Outcome Measure  Help needed turning from your back to your side while in a flat bed without using bedrails?: A  Little Help needed moving from lying on your back to sitting on the side of a flat bed without using bedrails?: A Little Help needed moving to and from a bed to a chair (including a wheelchair)?: A Little Help needed standing up from a chair using your arms (e.g., wheelchair or bedside chair)?: A Little Help needed to walk in hospital room?: A Little Help needed climbing 3-5 steps with a railing? : A Little 6 Click Score: 18    End of Session Equipment Utilized During Treatment: Gait belt;Left knee immobilizer Activity Tolerance: Patient tolerated treatment well;Patient limited by fatigue Patient left: in bed;with call bell/phone within reach;with bed alarm set Nurse Communication: Mobility status PT Visit Diagnosis: Difficulty in walking, not elsewhere classified (R26.2)     Time: 1450-1511 PT Time Calculation (min) (ACUTE ONLY): 21 min  Charges:  $Gait Training: 8-22 mins                     [place] Pager [phone] Office [phone]    Kaston Faughn 02/26/2019, 3:36 PM

## 2019-02-27 ENCOUNTER — Inpatient Hospital Stay (HOSPITAL_COMMUNITY): Payer: Medicare Other

## 2019-02-27 LAB — URINALYSIS, ROUTINE W REFLEX MICROSCOPIC
Bacteria, UA: NONE SEEN
Bilirubin Urine: NEGATIVE
Glucose, UA: NEGATIVE mg/dL
Ketones, ur: NEGATIVE mg/dL
Leukocytes,Ua: NEGATIVE
Nitrite: NEGATIVE
Protein, ur: 30 mg/dL — AB
Specific Gravity, Urine: 1.018 (ref 1.005–1.030)
pH: 6 (ref 5.0–8.0)

## 2019-02-27 NOTE — Progress Notes (Signed)
Pt and pt's Wife provided with d/c instructions. After discussing the pt's plan of care upon d/c home, the pt reported no further questions or concerns. Pt advised to discontinue the use of gabapentin d/t c/o intermittent tremors, per Griffith Citron, [place].

## 2019-02-27 NOTE — Progress Notes (Addendum)
Physical Therapy Treatment Patient Details Name: Alexander [name] MRN: [id number] DOB: 05-Oct-1940 Today's Date: 02/27/2019    History of Present Illness Pt L TKR    PT Comments    Pt performed HEP with assist.  Follow Up Recommendations  Follow surgeon's recommendation for DC plan and follow-up therapies     Equipment Recommendations  None recommended by PT    Recommendations for Other Services       Precautions / Restrictions Precautions Precautions: Fall;Knee Required Braces or Orthoses: Knee Immobilizer - Left Knee Immobilizer - Left: Discontinue once straight leg raise with < 10 degree lag Restrictions Weight Bearing Restrictions: No Other Position/Activity Restrictions: WBAT    Mobility  Bed Mobility                  Transfers                    Ambulation/Gait                 Stairs             Wheelchair Mobility    Modified Rankin (Stroke Patients Only)       Balance                                            Cognition Arousal/Alertness: Awake/alert Behavior During Therapy: WFL for tasks assessed/performed Overall Cognitive Status: Within Functional Limits for tasks assessed                                        Exercises  Total Joint Exercises Ankle Circles/Pumps: AROM;Both;15 reps;Supine Quad Sets: AROM;Both;Supine;15 reps Heel Slides: AAROM;Left;15 reps;Supine Straight Leg Raises: AAROM;Left;Supine;20 reps Long Arc Quad: AAROM;Left;10 reps;Seated Goniometric ROM: AAROM L knee -5 - 45   General Comments        Pertinent Vitals/Pain Pain Assessment: 0-10 Pain Score: 4  Pain Location: L knee Pain Descriptors / Indicators: Aching;Sore Pain Intervention(s): Limited activity within patient's tolerance;Monitored during session;Premedicated before session;Ice applied    Home Living                      Prior Function            PT Goals (current goals  can now be found in the care plan section) Acute Rehab PT Goals Patient Stated Goal: Regain IND PT Goal Formulation: With patient Time For Goal Achievement: 03/03/19 Potential to Achieve Goals: Good Progress towards PT goals: Progressing toward goals    Frequency    7X/week      PT Plan Current plan remains appropriate    Co-evaluation              AM-PAC PT "6 Clicks" Mobility   Outcome Measure  Help needed turning from your back to your side while in a flat bed without using bedrails?: A Little Help needed moving from lying on your back to sitting on the side of a flat bed without using bedrails?: A Little Help needed moving to and from a bed to a chair (including a wheelchair)?: A Little Help needed standing up from a chair using your arms (e.g., wheelchair or bedside chair)?: A Little Help needed to walk in hospital room?: A Little Help needed climbing 3-5 steps  with a railing? : A Little 6 Click Score: 18    End of Session Equipment Utilized During Treatment: Gait belt;Left knee immobilizer Activity Tolerance: Patient tolerated treatment well Patient left: in chair;with call bell/phone within reach;with chair alarm set Nurse Communication: Mobility status PT Visit Diagnosis: Difficulty in walking, not elsewhere classified (R26.2)     Time: [id number] PT Time Calculation (min) (ACUTE ONLY): 22 min  Charges:  $Therapeutic Exercise: 8-22 mins                     [place] Pager [phone] Office [phone]    Jinelle Butchko 02/27/2019, 3:46 PM

## 2019-02-27 NOTE — Progress Notes (Signed)
   Subjective: 4 Days Post-Op Procedure(s) (LRB): TOTAL KNEE ARTHROPLASTY (Left) Patient reports pain as mild.   Patient seen in rounds for Dr. Wynelle Link. Patient is well today. He reports that he felt he had hallucinations last night, which have since resolved. He feels this may correlate to having taken Ambien and Oxycodone last night. Otherwise, no acute events overnight. He was having some issues with Gabapentin yesterday, and this was discontinued. Denies CP, SHOB, N/V, abdominal pain. Voiding without difficulty, positive flatus.  Plan is to go Home after hospital stay.  Objective: Vital signs in last 24 hours: Temp:  [98.5 F (36.9 C)-99.8 F (37.7 C)] 99 F (37.2 C) (12/20 0637) Pulse Rate:  [78-87] 78 (12/20 0637) Resp:  [16-20] 16 (12/20 0637) BP: (125-135)/(49-66) 131/66 (12/20 0637) SpO2:  [95 %-99 %] 95 % (12/20 0637)  Intake/Output from previous day:  Intake/Output Summary (Last 24 hours) at 02/27/2019 0802 Last data filed at 02/27/2019 0730 Gross per 24 hour  Intake 1500 ml  Output 1950 ml  Net -450 ml    Intake/Output this shift: Total I/O In: -  Out: 100 [Urine:100]  Labs: Recent Labs    02/25/19 0210 02/26/19 0151  HGB 11.5* 11.0*   Recent Labs    02/25/19 0210 02/26/19 0151  WBC 15.2* 11.5*  RBC 3.60* 3.44*  HCT 35.1* 33.7*  PLT 155 144*   Recent Labs    02/25/19 0210  NA 136  K 4.0  CL 103  CO2 25  BUN 17  CREATININE 0.85  GLUCOSE 137*  CALCIUM 8.6*   No results for input(s): LABPT, INR in the last 72 hours.  Exam: General - Patient is Alert and Oriented Extremity - Neurologically intact Sensation intact distally Intact pulses distally Dorsiflexion/Plantar flexion intact Dressing/Incision - clean Motor Function - intact, moving foot and toes well on exam.   Past Medical History:  Diagnosis Date  . Hyperlipidemia   . Hypertension   . MVP (mitral valve prolapse)    mild  . Syncope     Assessment/Plan: 4 Days Post-Op  Procedure(s) (LRB): TOTAL KNEE ARTHROPLASTY (Left) Principal Problem:   OA (osteoarthritis) of knee Active Problems:   Osteoarthritis of left knee  Estimated body mass index is 25.72 kg/m as calculated from the following:   Height as of this encounter: 5\' 9"  (1.753 m).   Weight as of this encounter: 79 kg. Advance diet Up with therapy D/C IV fluids  DVT Prophylaxis - Xarelto Weight-bearing as tolerated  Plan for hopeful discharge today following 1-2 sessions of therapy. He has done excellent with therapy, and should progress to safe discharge home today. Scheduled for OPPT. Follow up in the office in 2 weeks.   Griffith Citron, PA-C Orthopedic Surgery [phone] 02/27/2019, 8:02 AM

## 2019-02-27 NOTE — Progress Notes (Signed)
Physical Therapy Treatment Patient Details Name: Alexander [name] MRN: [id number] DOB: 1940-04-06 Today's Date: 02/27/2019    History of Present Illness Pt L TKR    PT Comments    Pt feeling more clear headed vs previous session.  Pt and spouse reviewed LB dressing, don/doff KI and stairs.  Written instruction provided and spouse videoing process.   Follow Up Recommendations  Follow surgeon's recommendation for DC plan and follow-up therapies     Equipment Recommendations  None recommended by PT    Recommendations for Other Services       Precautions / Restrictions Precautions Precautions: Fall;Knee Required Braces or Orthoses: Knee Immobilizer - Left Knee Immobilizer - Left: Discontinue once straight leg raise with < 10 degree lag Restrictions Weight Bearing Restrictions: No Other Position/Activity Restrictions: WBAT    Mobility  Bed Mobility Overal bed mobility: Needs Assistance Bed Mobility: Supine to Sit     Supine to sit: Supervision Sit to supine: Supervision   General bed mobility comments: Increased time with cues for sequence; no physical assist  Transfers Overall transfer level: Needs assistance Equipment used: Rolling walker (2 wheeled) Transfers: Sit to/from Stand Sit to Stand: Supervision         General transfer comment: Increased time with cues for LE management and use of UEs to self assist.    Ambulation/Gait Ambulation/Gait assistance: Supervision Gait Distance (Feet): 80 Feet Assistive device: Rolling walker (2 wheeled) Gait Pattern/deviations: Step-to pattern;Decreased step length - right;Decreased step length - left;Shuffle;Trunk flexed Gait velocity: decr   General Gait Details: Increased time with min cues for sequence, posture and position from [employer] assistance: Min assist Stair Management: No rails;Step to pattern;Backwards;With walker;Forwards Number of Stairs: 6 General stair comments: single step once  fwd and once bkwd; 2 step twice bkwd; cues for sequence and foot/RW placement; spouse present to observe and video.  Written instruction provided.   Wheelchair Mobility    Modified Rankin (Stroke Patients Only)       Balance Overall balance assessment: Needs assistance Sitting-balance support: No upper extremity supported;Feet supported Sitting balance-Leahy Scale: Good     Standing balance support: No upper extremity supported Standing balance-Leahy Scale: Fair                              Cognition Arousal/Alertness: Awake/alert Behavior During Therapy: WFL for tasks assessed/performed Overall Cognitive Status: Within Functional Limits for tasks assessed                                 General Comments: Pt stating he feels "disoriented somehow"      Exercises Total Joint Exercises Ankle Circles/Pumps: AROM;Both;15 reps;Supine Quad Sets: AROM;Both;Supine;15 reps Heel Slides: AAROM;Left;15 reps;Supine Straight Leg Raises: AAROM;Left;Supine;20 reps Long Arc Quad: AAROM;Left;10 reps;Seated    General Comments        Pertinent Vitals/Pain Pain Assessment: 0-10 Pain Score: 5  Pain Location: L knee Pain Descriptors / Indicators: Aching;Sore Pain Intervention(s): Limited activity within patient's tolerance;Monitored during session    Home Living                      Prior Function            PT Goals (current goals can now be found in the care plan section) Acute Rehab PT Goals Patient Stated Goal: Regain IND PT Goal  Formulation: With patient Time For Goal Achievement: 03/03/19 Potential to Achieve Goals: Good Progress towards PT goals: Progressing toward goals    Frequency    7X/week      PT Plan Current plan remains appropriate    Co-evaluation              AM-PAC PT "6 Clicks" Mobility   Outcome Measure  Help needed turning from your back to your side while in a flat bed without using bedrails?: A  Little Help needed moving from lying on your back to sitting on the side of a flat bed without using bedrails?: A Little Help needed moving to and from a bed to a chair (including a wheelchair)?: A Little Help needed standing up from a chair using your arms (e.g., wheelchair or bedside chair)?: A Little Help needed to walk in hospital room?: A Little Help needed climbing 3-5 steps with a railing? : A Little 6 Click Score: 18    End of Session Equipment Utilized During Treatment: Gait belt;Left knee immobilizer Activity Tolerance: Patient tolerated treatment well Patient left: in chair;with call bell/phone within reach Nurse Communication: Mobility status PT Visit Diagnosis: Difficulty in walking, not elsewhere classified (R26.2)     Time: [id number] PT Time Calculation (min) (ACUTE ONLY): 42 min  Charges:  $Gait Training: 8-22 mins $Therapeutic Exercise: 8-22 mins $Therapeutic Activity: 8-22 mins                     Debe Coder PT Acute Rehabilitation Services Pager [phone] Office [phone]    Alexander [name] 02/27/2019, 5:10 PM

## 2019-02-27 NOTE — Progress Notes (Addendum)
Physical Therapy Treatment Patient Details Name: Alexander [name] MRN: [id number] DOB: 1940/05/13 Today's Date: 02/27/2019    History of Present Illness Pt L TKR    PT Comments    Arrived in room to observe pt with gait belt looped around foot and slowly pulling L LE onto bed.  Pt states he had taken himself to and from bathroom and was just getting back in bed.  Pt states he had called for assistance but unable no one answered but pt also unable to show me which button he pushed for assist.  Pt states he is feeling disoriented but having difficulty explaining further.  RN aware and advises last pain meds were Tramadol and Robaxin over five hours ago.  RN will advise physician on call.  Pt's spouse present for family education and videoing portions of session including HEP and pt transferring from bed and to recliner.   Follow Up Recommendations  Follow surgeon's recommendation for DC plan and follow-up therapies     Equipment Recommendations  None recommended by PT    Recommendations for Other Services       Precautions / Restrictions Precautions Precautions: Fall;Knee Required Braces or Orthoses: Knee Immobilizer - Left Knee Immobilizer - Left: Discontinue once straight leg raise with < 10 degree lag Restrictions Weight Bearing Restrictions: No Other Position/Activity Restrictions: WBAT    Mobility  Bed Mobility Overal bed mobility: Needs Assistance Bed Mobility: Supine to Sit     Supine to sit: Supervision Sit to supine: Supervision   General bed mobility comments: Increased time with cues for sequence; no physical assist  Transfers Overall transfer level: Needs assistance Equipment used: Rolling walker (2 wheeled) Transfers: Sit to/from Stand Sit to Stand: Supervision         General transfer comment: Increased time with cues for LE management and use of UEs to self assist.    Ambulation/Gait Ambulation/Gait assistance: Supervision Gait Distance (Feet):  120 Feet Assistive device: Rolling walker (2 wheeled) Gait Pattern/deviations: Step-to pattern;Decreased step length - right;Decreased step length - left;Shuffle;Trunk flexed Gait velocity: decr   General Gait Details: Increased time with min cues for sequence, posture and position from [employer]             Wheelchair Mobility    Modified Rankin (Stroke Patients Only)       Balance Overall balance assessment: Needs assistance Sitting-balance support: No upper extremity supported;Feet supported Sitting balance-Leahy Scale: Good     Standing balance support: No upper extremity supported Standing balance-Leahy Scale: Fair                              Cognition Arousal/Alertness: Awake/alert Behavior During Therapy: WFL for tasks assessed/performed Overall Cognitive Status: Within Functional Limits for tasks assessed                                 General Comments: Pt stating he feels "disoriented somehow"      Exercises Total Joint Exercises Ankle Circles/Pumps: AROM;Both;15 reps;Supine Quad Sets: AROM;Both;Supine;15 reps Heel Slides: AAROM;Left;15 reps;Supine Straight Leg Raises: AAROM;Left;Supine;20 reps Long Arc Quad: AAROM;Left;10 reps;Seated Goniometric ROM: AAROM L knee -5 - 45    General Comments        Pertinent Vitals/Pain Pain Assessment: 0-10 Pain Score: 5  Pain Location: L knee Pain Descriptors / Indicators: Aching;Sore Pain Intervention(s): Limited activity within patient's tolerance;Monitored during  session;Ice applied    Home Living                      Prior Function            PT Goals (current goals can now be found in the care plan section) Acute Rehab PT Goals Patient Stated Goal: Regain IND PT Goal Formulation: With patient Time For Goal Achievement: 03/03/19 Potential to Achieve Goals: Good Progress towards PT goals: Progressing toward goals    Frequency    7X/week      PT  Plan Current plan remains appropriate    Co-evaluation              AM-PAC PT "6 Clicks" Mobility   Outcome Measure  Help needed turning from your back to your side while in a flat bed without using bedrails?: A Little Help needed moving from lying on your back to sitting on the side of a flat bed without using bedrails?: A Little Help needed moving to and from a bed to a chair (including a wheelchair)?: A Little Help needed standing up from a chair using your arms (e.g., wheelchair or bedside chair)?: A Little Help needed to walk in hospital room?: A Little Help needed climbing 3-5 steps with a railing? : A Little 6 Click Score: 18    End of Session Equipment Utilized During Treatment: Gait belt;Left knee immobilizer Activity Tolerance: Patient tolerated treatment well Patient left: in chair;with call bell/phone within reach;with chair alarm set Nurse Communication: Mobility status PT Visit Diagnosis: Difficulty in walking, not elsewhere classified (R26.2)     Time: [id number] PT Time Calculation (min) (ACUTE ONLY): 48 min  Charges:  $Gait Training: 8-22 mins $Therapeutic Exercise: 8-22 mins $Therapeutic Activity: 8-22 mins                     [place] Pager [phone] Office [phone]    Earla Charlie 02/27/2019, 3:59 PM

## 2019-02-27 NOTE — Progress Notes (Signed)
Physical Therapy Treatment Patient Details Name: Alexander [name] MRN: [id number] DOB: August 11, 1940 Today's Date: 02/27/2019    History of Present Illness Pt L TKR    PT Comments    Continued improvement in all aspects including cognition, balance and activity tolerance.   Follow Up Recommendations  Follow surgeon's recommendation for DC plan and follow-up therapies     Equipment Recommendations  None recommended by PT    Recommendations for Other Services       Precautions / Restrictions Precautions Precautions: Fall;Knee Required Braces or Orthoses: Knee Immobilizer - Left Knee Immobilizer - Left: Discontinue once straight leg raise with < 10 degree lag Restrictions Weight Bearing Restrictions: No Other Position/Activity Restrictions: WBAT    Mobility  Bed Mobility Overal bed mobility: Needs Assistance Bed Mobility: Supine to Sit     Supine to sit: Supervision     General bed mobility comments: Increased time with cues for sequence; no physical assist  Transfers Overall transfer level: Needs assistance Equipment used: Rolling walker (2 wheeled) Transfers: Sit to/from Stand Sit to Stand: Min guard         General transfer comment: cues for LE management and use of UEs to self assist.  Min guard only for mild instability with initial rising  Ambulation/Gait Ambulation/Gait assistance: Min guard;Supervision Gait Distance (Feet): 200 Feet Assistive device: Rolling walker (2 wheeled) Gait Pattern/deviations: Step-to pattern;Decreased step length - right;Decreased step length - left;Shuffle;Trunk flexed Gait velocity: decr   General Gait Details: Increased time with cues for sequence, posture and position from RW; initial mild instability but pt progressed to ambulating with supervision   Stairs             Wheelchair Mobility    Modified Rankin (Stroke Patients Only)       Balance Overall balance assessment: Needs  assistance Sitting-balance support: No upper extremity supported;Feet supported Sitting balance-Leahy Scale: Good     Standing balance support: No upper extremity supported Standing balance-Leahy Scale: Fair                              Cognition Arousal/Alertness: Awake/alert Behavior During Therapy: WFL for tasks assessed/performed Overall Cognitive Status: Within Functional Limits for tasks assessed                                 General Comments: Marked improvement in cognition with pt processing cues faster and with improved safety awareness      Exercises      General Comments        Pertinent Vitals/Pain Pain Assessment: 0-10 Pain Score: 4  Pain Location: L knee Pain Descriptors / Indicators: Aching;Sore Pain Intervention(s): Limited activity within patient's tolerance;Monitored during session;Premedicated before session;Ice applied    Home Living                      Prior Function            PT Goals (current goals can now be found in the care plan section) Acute Rehab PT Goals Patient Stated Goal: Regain IND PT Goal Formulation: With patient Time For Goal Achievement: 03/03/19 Potential to Achieve Goals: Good Progress towards PT goals: Progressing toward goals    Frequency    7X/week      PT Plan Current plan remains appropriate    Co-evaluation  AM-PAC PT "6 Clicks" Mobility   Outcome Measure  Help needed turning from your back to your side while in a flat bed without using bedrails?: A Little Help needed moving from lying on your back to sitting on the side of a flat bed without using bedrails?: A Little Help needed moving to and from a bed to a chair (including a wheelchair)?: A Little Help needed standing up from a chair using your arms (e.g., wheelchair or bedside chair)?: A Little Help needed to walk in hospital room?: A Little Help needed climbing 3-5 steps with a railing? : A  Little 6 Click Score: 18    End of Session Equipment Utilized During Treatment: Gait belt;Left knee immobilizer Activity Tolerance: Patient tolerated treatment well Patient left: in chair;with call bell/phone within reach;with chair alarm set Nurse Communication: Mobility status PT Visit Diagnosis: Difficulty in walking, not elsewhere classified (R26.2)     Time: [id number] PT Time Calculation (min) (ACUTE ONLY): 24 min  Charges:  $Gait Training: 23-37 mins                     [place] Pager [phone] Office [phone]    Alexander [name] 02/27/2019, 9:51 AM

## 2019-03-02 ENCOUNTER — Other Ambulatory Visit: Payer: Self-pay | Admitting: *Deleted

## 2019-03-02 NOTE — Discharge Summary (Signed)
Physician Discharge Summary   Patient ID: Alexander [name] MRN: [id number] DOB/AGE: 79/18/1942 79 y.o.  Admit date: 02/23/2019 Discharge date: 02/27/2019  Primary Diagnosis: Osteoarthritis  Left knee(s)  Admission Diagnoses:  Past Medical History:  Diagnosis Date  . Hyperlipidemia   . Hypertension   . MVP (mitral valve prolapse)    mild  . Syncope    Discharge Diagnoses:   Principal Problem:   OA (osteoarthritis) of knee Active Problems:   Osteoarthritis of left knee  Estimated body mass index is 25.72 kg/m as calculated from the following:   Height as of this encounter: 5\' 9"  (1.753 m).   Weight as of this encounter: 79 kg.  Procedure:  Procedure(s) (LRB): TOTAL KNEE ARTHROPLASTY (Left)   Consults: None  HPI:  Alexander [name] is a 78 y.o. year old male with end stage OA of his left knee with progressively worsening pain and dysfunction. He has constant pain, with activity and at rest and significant functional deficits with difficulties even with ADLs. He has had extensive non-op management including analgesics, injections of cortisone and viscosupplements, and home exercise program, but remains in significant pain with significant dysfunction. Radiographs show bone on bone arthritis medial and patellofemoral. He presents now for left Total Knee Arthroplasty.    Laboratory Data: Admission on 02/23/2019, Discharged on 02/27/2019  Component Date Value Ref Range Status  . WBC 02/24/2019 11.2* 4.0 - 10.5 K/uL Final  . RBC 02/24/2019 3.98* 4.22 - 5.81 MIL/uL Final  . Hemoglobin 02/24/2019 12.5* 13.0 - 17.0 g/dL Final  . HCT 02/24/2019 38.5* 39.0 - 52.0 % Final  . MCV 02/24/2019 96.7  80.0 - 100.0 fL Final  . MCH 02/24/2019 31.4  26.0 - 34.0 pg Final  . MCHC 02/24/2019 32.5  30.0 - 36.0 g/dL Final  . RDW 02/24/2019 12.6  11.5 - 15.5 % Final  . Platelets 02/24/2019 156  150 - 400 K/uL Final  . nRBC 02/24/2019 0.0  0.0 - 0.2 % Final   Performed at [facility], [place] [address] [place], [place] [address]  . Sodium 02/24/2019 135  135 - 145 mmol/L Final  . Potassium 02/24/2019 3.8  3.5 - 5.1 mmol/L Final  . Chloride 02/24/2019 100  98 - 111 mmol/L Final  . CO2 02/24/2019 28  22 - 32 mmol/L Final  . Glucose, Bld 02/24/2019 200* 70 - 99 mg/dL Final  . BUN 02/24/2019 17  8 - 23 mg/dL Final  . Creatinine, Ser 02/24/2019 1.00  0.61 - 1.24 mg/dL Final  . Calcium 02/24/2019 8.5* 8.9 - 10.3 mg/dL Final  . GFR calc non Af Amer 02/24/2019 >60  >60 mL/min Final  . GFR calc Af Amer 02/24/2019 >60  >60 mL/min Final  . Anion gap 02/24/2019 7  5 - 15 Final   Performed at [facility], [place] [address], [place], [place] [address]  . WBC 02/25/2019 15.2* 4.0 - 10.5 K/uL Final  . RBC 02/25/2019 3.60* 4.22 - 5.81 MIL/uL Final  . Hemoglobin 02/25/2019 11.5* 13.0 - 17.0 g/dL Final  . HCT 02/25/2019 35.1* 39.0 - 52.0 % Final  . MCV 02/25/2019 97.5  80.0 - 100.0 fL Final  . MCH 02/25/2019 31.9  26.0 - 34.0 pg Final  . MCHC 02/25/2019 32.8  30.0 - 36.0 g/dL Final  . RDW 02/25/2019 12.9  11.5 - 15.5 % Final  . Platelets 02/25/2019 155  150 - 400 K/uL Final  . nRBC 02/25/2019 0.0  0.0 - 0.2 %  Final   Performed at [facility], [place] [address], [place], [place] [address]  . Sodium 02/25/2019 136  135 - 145 mmol/L Final  . Potassium 02/25/2019 4.0  3.5 - 5.1 mmol/L Final  . Chloride 02/25/2019 103  98 - 111 mmol/L Final  . CO2 02/25/2019 25  22 - 32 mmol/L Final  . Glucose, Bld 02/25/2019 137* 70 - 99 mg/dL Final  . BUN 02/25/2019 17  8 - 23 mg/dL Final  . Creatinine, Ser 02/25/2019 0.85  0.61 - 1.24 mg/dL Final  . Calcium 02/25/2019 8.6* 8.9 - 10.3 mg/dL Final  . GFR calc non Af Amer 02/25/2019 >60  >60 mL/min Final  . GFR calc Af Amer 02/25/2019 >60  >60 mL/min Final  . Anion gap 02/25/2019 8  5 - 15 Final   Performed at [facility], [place] [address], [place], [place] [address]  . WBC  02/26/2019 11.5* 4.0 - 10.5 K/uL Final  . RBC 02/26/2019 3.44* 4.22 - 5.81 MIL/uL Final  . Hemoglobin 02/26/2019 11.0* 13.0 - 17.0 g/dL Final  . HCT 02/26/2019 33.7* 39.0 - 52.0 % Final  . MCV 02/26/2019 98.0  80.0 - 100.0 fL Final  . MCH 02/26/2019 32.0  26.0 - 34.0 pg Final  . MCHC 02/26/2019 32.6  30.0 - 36.0 g/dL Final  . RDW 02/26/2019 13.2  11.5 - 15.5 % Final  . Platelets 02/26/2019 144* 150 - 400 K/uL Final   Comment: PLATELET COUNT CONFIRMED BY SMEAR SPECIMEN CHECKED FOR CLOTS   . nRBC 02/26/2019 0.0  0.0 - 0.2 % Final   Performed at [facility], [place] [address] [place], [place] [address]  . Color, Urine 02/27/2019 YELLOW  YELLOW Final  . APPearance 02/27/2019 CLEAR  CLEAR Final  . Specific Gravity, Urine 02/27/2019 1.018  1.005 - 1.030 Final  . pH 02/27/2019 6.0  5.0 - 8.0 Final  . Glucose, UA 02/27/2019 NEGATIVE  NEGATIVE mg/dL Final  . Hgb urine dipstick 02/27/2019 SMALL* NEGATIVE Final  . Bilirubin Urine 02/27/2019 NEGATIVE  NEGATIVE Final  . Ketones, ur 02/27/2019 NEGATIVE  NEGATIVE mg/dL Final  . Protein, ur 02/27/2019 30* NEGATIVE mg/dL Final  . Nitrite 02/27/2019 NEGATIVE  NEGATIVE Final  . Chalmers Guest 02/27/2019 NEGATIVE  NEGATIVE Final  . RBC / HPF 02/27/2019 0-5  0 - 5 RBC/hpf Final  . WBC, UA 02/27/2019 0-5  0 - 5 WBC/hpf Final  . Bacteria, UA 02/27/2019 NONE SEEN  NONE SEEN Final   Performed at [facility], [place] [address], [place], [place] [address]  Hospital Outpatient Visit on 02/22/2019  Component Date Value Ref Range Status  . aPTT 02/22/2019 36  24 - 36 seconds Final   Performed at [facility], [place] [address] [place], [place] [address]  . WBC 02/22/2019 7.4  4.0 - 10.5 K/uL Final  . RBC 02/22/2019 4.54  4.22 - 5.81 MIL/uL Final  . Hemoglobin 02/22/2019 14.8  13.0 - 17.0 g/dL Final  . HCT 02/22/2019 44.0  39.0 - 52.0 % Final  . MCV 02/22/2019 96.9  80.0 - 100.0 fL Final  . MCH 02/22/2019  32.6  26.0 - 34.0 pg Final  . MCHC 02/22/2019 33.6  30.0 - 36.0 g/dL Final  . RDW 02/22/2019 12.8  11.5 - 15.5 % Final  . Platelets 02/22/2019 172  150 - 400 K/uL Final  . nRBC 02/22/2019 0.0  0.0 - 0.2 % Final   Performed at [facility],  [place] [address], [place], [place] [address]  . Sodium 02/22/2019 141  135 - 145 mmol/L Final  . Potassium 02/22/2019 4.4  3.5 - 5.1 mmol/L Final  . Chloride 02/22/2019 105  98 - 111 mmol/L Final  . CO2 02/22/2019 27  22 - 32 mmol/L Final  . Glucose, Bld 02/22/2019 102* 70 - 99 mg/dL Final  . BUN 02/22/2019 20  8 - 23 mg/dL Final  . Creatinine, Ser 02/22/2019 0.96  0.61 - 1.24 mg/dL Final  . Calcium 02/22/2019 9.4  8.9 - 10.3 mg/dL Final  . Total Protein 02/22/2019 6.5  6.5 - 8.1 g/dL Final  . Albumin 02/22/2019 4.1  3.5 - 5.0 g/dL Final  . AST 02/22/2019 25  15 - 41 U/L Final  . ALT 02/22/2019 23  0 - 44 U/L Final  . Alkaline Phosphatase 02/22/2019 53  38 - 126 U/L Final  . Total Bilirubin 02/22/2019 1.0  0.3 - 1.2 mg/dL Final  . GFR calc non Af Amer 02/22/2019 >60  >60 mL/min Final  . GFR calc Af Amer 02/22/2019 >60  >60 mL/min Final  . Anion gap 02/22/2019 9  5 - 15 Final   Performed at [facility], [place] [address] [place], [place] [address]  . Prothrombin Time 02/22/2019 12.1  11.4 - 15.2 seconds Final  . INR 02/22/2019 0.9  0.8 - 1.2 Final   Comment: (NOTE) INR goal varies based on device and disease states. Performed at [facility], [place] [address] [place], [place] [address]   . ABO/RH(D) 02/22/2019 O POS   Final  . Antibody Screen 02/22/2019 NEG   Final  . Sample Expiration 02/22/2019 02/26/2019,2359   Final  . Extend sample reason 02/22/2019    Final                   Value:NO TRANSFUSIONS OR PREGNANCY IN THE PAST 3 MONTHS Performed at [place] [address], [place], [place] [address]   . MRSA, PCR 02/22/2019 NEGATIVE  NEGATIVE Final  . Staphylococcus  aureus 02/22/2019 NEGATIVE  NEGATIVE Final   Comment: (NOTE) The Xpert SA Assay (FDA approved for NASAL specimens in patients 45 years of age and older), is one component of a comprehensive surveillance program. It is not intended to diagnose infection nor to guide or monitor treatment. Performed at [facility], [place] [address] [place], [place] [address]   . ABO/RH(D) 02/22/2019    Final                   Value:O POS Performed at [facility], [place] [address], [place],  [address]   Hospital Outpatient Visit on 02/19/2019  Component Date Value Ref Range Status  . SARS-CoV-2, NAA 02/19/2019 NOT DETECTED  NOT DETECTED Final   Comment: (NOTE) This nucleic acid amplification test was developed and its performance characteristics determined by [employer]. Nucleic acid amplification tests include PCR and TMA. This test has not been FDA cleared or approved. This test has been authorized by FDA under an Emergency Use Authorization (EUA). This test is only authorized for the duration of time the declaration that circumstances exist justifying the authorization of the emergency use of in vitro diagnostic tests for detection of SARS-CoV-2 virus and/or diagnosis of COVID-19 infection under section 564(b)(1) of the Act, 21 U.S.C. [id number]) (1), unless the authorization is terminated or revoked sooner. When diagnostic testing is negative, the possibility of a false negative result  should be considered in the context of a patient's recent exposures and the presence of clinical signs and symptoms consistent with COVID-19. An individual without symptoms of COVID- 19 and who is not shedding SARS-CoV-2 vi                          rus would expect to have a negative (not detected) result in this assay. Performed At: [facility] [place], [place] [id number] Rush Farmer MD [id number]   . Coronavirus Source  02/19/2019 NASOPHARYNGEAL   Final   Performed at [facility], [place] [address], [place], [place] [address]  Appointment on 02/10/2019  Component Date Value Ref Range Status  . Weight 02/10/2019 2,812.8  oz Final  . Height 02/10/2019 69.5  in Final  . BP 02/10/2019 142/78  mmHg Final  Office Visit on 02/10/2019  Component Date Value Ref Range Status  . Glucose 02/10/2019 94  65 - 99 mg/dL Final  . BUN 02/10/2019 17  8 - 27 mg/dL Final  . Creatinine, Ser 02/10/2019 0.87  0.76 - 1.27 mg/dL Final  . GFR calc non Af Amer 02/10/2019 82  >59 mL/min/1.73 Final  . GFR calc Af Amer 02/10/2019 95  >59 mL/min/1.73 Final  . BUN/Creatinine Ratio 02/10/2019 20  10 - 24 Final  . Sodium 02/10/2019 143  134 - 144 mmol/L Final  . Potassium 02/10/2019 4.4  3.5 - 5.2 mmol/L Final  . Chloride 02/10/2019 104  96 - 106 mmol/L Final  . CO2 02/10/2019 27  20 - 29 mmol/L Final  . Calcium 02/10/2019 9.3  8.6 - 10.2 mg/dL Final  . Total Protein 02/10/2019 6.2  6.0 - 8.5 g/dL Final  . Albumin 02/10/2019 4.5  3.7 - 4.7 g/dL Final  . Globulin, Total 02/10/2019 1.7  1.5 - 4.5 g/dL Final  . Albumin/Globulin Ratio 02/10/2019 2.6* 1.2 - 2.2 Final  . Bilirubin Total 02/10/2019 0.5  0.0 - 1.2 mg/dL Final  . Alkaline Phosphatase 02/10/2019 64  39 - 117 IU/L Final  . AST 02/10/2019 23  0 - 40 IU/L Final  . ALT 02/10/2019 21  0 - 44 IU/L Final  . WBC 02/10/2019 7.4  3.4 - 10.8 x10E3/uL Final  . RBC 02/10/2019 4.32  4.14 - 5.80 x10E6/uL Final  . Hemoglobin 02/10/2019 13.8  13.0 - 17.7 g/dL Final  . Hematocrit 02/10/2019 40.7  37.5 - 51.0 % Final  . MCV 02/10/2019 94  79 - 97 fL Final  . MCH 02/10/2019 31.9  26.6 - 33.0 pg Final  . MCHC 02/10/2019 33.9  31.5 - 35.7 g/dL Final  . RDW 02/10/2019 12.6  11.6 - 15.4 % Final  . Platelets 02/10/2019 198  150 - 450 x10E3/uL Final  . Neutrophils 02/10/2019 56  Not Estab. % Final  . Lymphs 02/10/2019 27  Not Estab. % Final  . Monocytes 02/10/2019 14  Not Estab. % Final  .  Eos 02/10/2019 2  Not Estab. % Final  . Basos 02/10/2019 1  Not Estab. % Final  . Neutrophils Absolute 02/10/2019 4.2  1.4 - 7.0 x10E3/uL Final  . Lymphocytes Absolute 02/10/2019 2.0  0.7 - 3.1 x10E3/uL Final  . Monocytes Absolute 02/10/2019 1.0* 0.1 - 0.9 x10E3/uL Final  . EOS (ABSOLUTE) 02/10/2019 0.2  0.0 - 0.4 x10E3/uL Final  . Basophils Absolute 02/10/2019 0.0  0.0 - 0.2 x10E3/uL Final  . Immature Granulocytes 02/10/2019 0  Not Estab. % Final  .  Immature Grans (Abs) 02/10/2019 0.0  0.0 - 0.1 x10E3/uL Final  . Cholesterol, Total 02/10/2019 169  100 - 199 mg/dL Final  . Triglycerides 02/10/2019 80  0 - 149 mg/dL Final  . HDL 02/10/2019 81  >39 mg/dL Final  . VLDL Cholesterol Cal 02/10/2019 15  5 - 40 mg/dL Final  . LDL Chol Calc (NIH) 02/10/2019 73  0 - 99 mg/dL Final  . Chol/HDL Ratio 02/10/2019 2.1  0.0 - 5.0 ratio Final   Comment:                                   T. Chol/HDL Ratio                                             Men  Women                               1/2 Avg.Risk  3.4    3.3                                   Avg.Risk  5.0    4.4                                2X Avg.Risk  9.6    7.1                                3X Avg.Risk 23.4   11.0   . TSH 02/10/2019 1.310  0.450 - 4.500 uIU/mL Final  . NT-Pro BNP 02/10/2019 139  0 - 486 pg/mL Final   Comment: The following cut-points have been suggested for the use of proBNP for the diagnostic evaluation of heart failure (HF) in patients with acute dyspnea: Modality                     Age           Optimal Cut                            (years)            Point ------------------------------------------------------ Diagnosis (rule in HF)        <50            450 pg/mL                           50 - 75            900 pg/mL                               >75           1800 pg/mL Exclusion (rule out HF)  Age independent     300 pg/mL      X-Rays:MR KNEE LEFT WO CONTRAST  Result Date: 02/14/2019 CLINICAL DATA:  Left knee  pain for 6-8 months. No known injury.  History of arthroscopic surgery approximately 10 years ago. EXAM: MRI OF THE LEFT KNEE WITHOUT CONTRAST TECHNIQUE: Multiplanar, multisequence MR imaging of the knee was performed. No intravenous contrast was administered. COMPARISON:  None. FINDINGS: MENISCI Medial meniscus: The posterior horn and body are extremely diminutive most consistent with prior meniscectomy. Lateral meniscus: Fraying along the free edge of the anterior body is seen. LIGAMENTS Cruciates:  Intact. Collaterals: A cyst at the superior aspect of the PCL measures 1.2 cm craniocaudal by 1.4 cm AP x 0.8 cm transverse. Mucoid degeneration of the ACL is noted. CARTILAGE Patellofemoral:  Mildly thinned without focal defect. Medial:  Cartilage appears almost completely. Denuded Lateral:  Mildly degenerated. Joint:  Moderate joint effusion. Popliteal Fossa:  No Baker's cyst. Extensor Mechanism:  Intact. Bones: Osteophytosis is seen about the knee. No fracture or worrisome lesion. Minimal reactive marrow edema in the tibial eminences from mucoid degeneration of the ACL is noted. Other: None. IMPRESSION: 1. Dominant finding is osteoarthritis about the knee appearing worst in the medial compartment where it is moderate to moderately severe. 2. Diminutive posterior horn and body of the medial meniscus most consistent with prior meniscectomy. 3. Fraying along the free edge of the anterior body of the lateral meniscus. 4. Mucoid degeneration of the ACL without tear. Electronically Signed   By: Inge Rise M.D.   On: 02/14/2019 15:21   ECHOCARDIOGRAM COMPLETE  Result Date: 02/10/2019   ECHOCARDIOGRAM REPORT   Patient Name:   Alexander [name] Date of Exam: 02/10/2019 Medical Rec #:  [id number]          Height:       69.5 in Accession #:    [id number]         Weight:       175.8 lb Date of Birth:  1940-05-31          BSA:          1.97 m Patient Age:    3 years           BP:           142/78 mmHg Patient Gender: M                   HR:           80 bpm. Exam Location:  [facility] Procedure: 2D Echo, Cardiac Doppler and Color Doppler Indications:    R06.02 Shortness of breath  History:        Patient has prior history of Echocardiogram examinations, most                 recent 05/03/2018. Mitral Valve Prolapse, Arrythmias:RBBB,                 Signs/Symptoms:Syncope; Risk Factors:Hypertension.  Sonographer:    Jessee Avers, RDCS Referring Phys: [id number] [place]  1. Left ventricular ejection fraction, by visual estimation, is 55 to 60%. The left ventricle has normal function. There is no left ventricular hypertrophy.  2. Elevated left ventricular end-diastolic pressure.  3. Left ventricular diastolic parameters are consistent with Grade II diastolic dysfunction (pseudonormalization).  4. Mildly dilated left ventricular internal cavity size.  5. Global right ventricle has normal systolic function.The right ventricular size is normal. No increase in right ventricular wall thickness.  6. Left atrial size was normal.  7. Right atrial size was normal.  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 No evidence of mitral stenosis.  9. Mild bileaflet  mitral valve prolapse. 10. The tricuspid valve is normal in structure. Tricuspid valve regurgitation is trivial. 11. The aortic valve is tricuspid. Aortic valve regurgitation is not visualized. No evidence of aortic valve sclerosis or stenosis. 12. The pulmonic valve was normal in structure. Pulmonic valve regurgitation is not visualized. 13. Moderately elevated pulmonary artery systolic pressure. 14. A pacer wire is visualized. In comparison to the previous echocardiogram(s): 05/03/18 EF 55-60%. Mild MR. FINDINGS  Left Ventricle: Left ventricular ejection fraction, by visual estimation, is 55 to 60%. The left ventricle has normal function. The left ventricular internal cavity size was mildly dilated left ventricle. There is no left ventricular  hypertrophy. Left ventricular diastolic parameters are consistent with Grade II diastolic dysfunction (pseudonormalization). Elevated left ventricular end-diastolic pressure. Right Ventricle: The right ventricular size is normal. No increase in right ventricular wall thickness. Global RV systolic function is has normal systolic function. The tricuspid regurgitant velocity is 2.61 m/s, and with an assumed right atrial pressure  of 15 mmHg, the estimated right ventricular systolic pressure is moderately elevated at 42.2 mmHg. Left Atrium: Left atrial size was normal in size. Right Atrium: Right atrial size was normal in size Pericardium: There is no evidence of pericardial effusion. Mitral Valve: The mitral valve is myxomatous. No evidence of mitral valve stenosis by observation. Mild mitral valve regurgitation. Mild bileaflet mitral valve prolapse. Tricuspid Valve: The tricuspid valve is normal in structure. Tricuspid valve regurgitation is trivial. Aortic Valve: The aortic valve is tricuspid. Aortic valve regurgitation is not visualized. The aortic valve is structurally normal, with no evidence of sclerosis or stenosis. Pulmonic Valve: The pulmonic valve was normal in structure. Pulmonic valve regurgitation is not visualized. Aorta: The aortic root, ascending aorta and aortic arch are all structurally normal, with no evidence of dilitation or obstruction. IAS/Shunts: No atrial level shunt detected by color flow Doppler. There is no evidence of a patent foramen ovale. No ventricular septal defect is seen or detected. There is no evidence of an atrial septal defect. Additional Comments: A pacer wire is visualized.  LEFT VENTRICLE PLAX 2D LVIDd:         6.10 cm  Diastology LVIDs:         3.80 cm  LV e' lateral:   7.80 cm/s LV PW:         0.80 cm  LV E/e' lateral: 12.6 LV IVS:        0.90 cm  LV e' medial:    6.90 cm/s LVOT diam:     2.00 cm  LV E/e' medial:  14.3 LV SV:         125 ml LV SV Index:   62.88 LVOT Area:      3.14 cm  RIGHT VENTRICLE RV Basal diam:  4.30 cm RV S prime:     12.20 cm/s RVSP:           30.2 mmHg LEFT ATRIUM           Index       RIGHT ATRIUM LA diam:      4.20 cm 2.14 cm/m  RA Pressure: 3.00 mmHg LA Vol (A4C): 43.7 ml 22.23 ml/m  AORTIC VALVE LVOT Vmax:   108.00 cm/s LVOT Vmean:  74.600 cm/s LVOT VTI:    0.223 m  AORTA Ao Root diam: 3.60 cm MITRAL VALVE                        TRICUSPID VALVE  TR Peak grad:   27.2 mmHg                                     TR Vmax:        261.00 cm/s MV Decel Time: 134 msec             Estimated RAP:  3.00 mmHg MV E velocity: 98.50 cm/s 103 cm/s  RVSP:           30.2 mmHg MV A velocity: 83.10 cm/s 70.3 cm/s MV E/A ratio:  1.19       1.5       SHUNTS                                     Systemic VTI:  0.22 m                                     Systemic Diam: 2.00 cm  Skeet Latch MD Electronically signed by Skeet Latch MD Signature Date/Time: 02/10/2019/4:28:19 PM    Final     EKG: Orders placed or performed in visit on 02/10/19  . EKG 12-Lead     Hospital Course: Alexander [name] is a 78 y.o. who was admitted to [facility]. They were brought to the operating room on 02/23/2019 and underwent Procedure(s): TOTAL KNEE ARTHROPLASTY.  Patient tolerated the procedure well and was later transferred to the recovery room and then to the orthopaedic floor for postoperative care. They were given PO and IV analgesics for pain control following their surgery. They were given 24 hours of postoperative antibiotics of  Anti-infectives (From admission, onward)   Start     Dose/Rate Route Frequency Ordered Stop   02/23/19 1900  ceFAZolin (ANCEF) IVPB 2g/100 mL premix     2 g 200 mL/hr over 30 Minutes Intravenous Every 6 hours 02/23/19 1618 02/24/19 0220   02/23/19 1230  ceFAZolin (ANCEF) IVPB 2g/100 mL premix     2 g 200 mL/hr over 30 Minutes Intravenous On call to O.R. 02/23/19 1217 02/23/19 1314     and started on  DVT prophylaxis in the form of Xarelto.   PT and OT were ordered for total joint protocol. Discharge planning consulted to help with postop disposition and equipment needs. Patient had a good night on the evening of surgery. They started to get up OOB with therapy on POD #1. Hemovac drain was pulled without difficulty on day one. Continued to work with therapy into POD #2. Pt was seen during rounds on day two and was ready to go home pending progress with therapy. Patient progressed well with therapy, but was not able to meet all of his goals that day. He was seen on POD #3 and was doing well. He continued to work with therapy towards progression to safe discharge home. Patient was seen in rounds on POD #4 and was doing well. Patient was ready for discharge home pending he continued to meet goals with therapy. Dressing was changed and the incision was clean and intact. Pt worked with therapy for three additional sessions and was meeting their goals. He was discharged to home later that day in stable condition.  Diet: Regular diet Activity: WBAT Follow-up: in 2 weeks Disposition:  Home Discharged Condition: good   Discharge Instructions    Call MD / Call 911   Complete by: As directed    If you experience chest pain or shortness of breath, CALL 911 and be transported to the hospital emergency room.  If you develope a fever above 101 F, pus (white drainage) or increased drainage or redness at the wound, or calf pain, call your surgeon's office.   Constipation Prevention   Complete by: As directed    Drink plenty of fluids.  Prune juice may be helpful.  You may use a stool softener, such as Colace (over the counter) 100 mg twice a day.  Use MiraLax (over the counter) for constipation as needed.   Diet - low sodium heart healthy   Complete by: As directed    Discharge instructions   Complete by: As directed    Dr. Gaynelle Arabian Total Joint Specialist [facility] [address], [place], [place] [address] [phone]  TOTAL KNEE REPLACEMENT POSTOPERATIVE DIRECTIONS  Knee Rehabilitation, Guidelines Following Surgery  Results after knee surgery are often greatly improved when you follow the exercise, range of motion and muscle strengthening exercises prescribed by your doctor. Safety measures are also important to protect the knee from further injury. Any time any of these exercises cause you to have increased pain or swelling in your knee joint, decrease the amount until you are comfortable again and slowly increase them. If you have problems or questions, call your caregiver or physical therapist for advice.   HOME CARE INSTRUCTIONS  Remove items at home which could result in a fall. This includes throw rugs or furniture in walking pathways.  ICE to the affected knee every three hours for 30 minutes at a time and then as needed for pain and swelling.  Continue to use ice on the knee for pain and swelling from surgery. You may notice swelling that will progress down to the foot and ankle.  This is normal after surgery.  Elevate the leg when you are not up walking on it.   Continue to use the breathing machine which will help keep your temperature down.  It is common for your temperature to cycle up and down following surgery, especially at night when you are not up moving around and exerting yourself.  The breathing machine keeps your lungs expanded and your temperature down. Do not place pillow under knee, focus on keeping the knee straight while resting   DIET You may resume your previous home diet once your are discharged from the hospital.  DRESSING / WOUND CARE / SHOWERING You may shower 3 days after surgery, but keep the wounds dry during showering.  You may use an occlusive plastic wrap (Press'n Seal for example), NO SOAKING/SUBMERGING IN THE BATHTUB.  If the bandage gets wet, change with a clean dry gauze.  If the incision gets wet, pat the wound dry with a clean  towel. You may start showering once you are discharged home but do not submerge the incision under water. Just pat the incision dry and apply a dry gauze dressing on daily. Change the surgical dressing daily and reapply a dry dressing each time.  ACTIVITY Walk with your walker as instructed. Use walker as long as suggested by your caregivers. Avoid periods of inactivity such as sitting longer than an hour when not asleep. This helps prevent blood clots.  You may resume a sexual relationship in one month or when given the OK by your  doctor.  You may return to work once you are cleared by your doctor.  Do not drive a car for 6 weeks or until released by you surgeon.  Do not drive while taking narcotics.  WEIGHT BEARING Weight bearing as tolerated with assist device (walker, cane, etc) as directed, use it as long as suggested by your surgeon or therapist, typically at least 4-6 weeks.  POSTOPERATIVE CONSTIPATION PROTOCOL Constipation - defined medically as fewer than three stools per week and severe constipation as less than one stool per week.  One of the most common issues patients have following surgery is constipation.  Even if you have a regular bowel pattern at home, your normal regimen is likely to be disrupted due to multiple reasons following surgery.  Combination of anesthesia, postoperative narcotics, change in appetite and fluid intake all can affect your bowels.  In order to avoid complications following surgery, here are some recommendations in order to help you during your recovery period.  Colace (docusate) - Pick up an over-the-counter form of Colace or another stool softener and take twice a day as long as you are requiring postoperative pain medications.  Take with a full glass of water daily.  If you experience loose stools or diarrhea, hold the colace until you stool forms back up.  If your symptoms do not get better within 1 week or if they get worse, check with your  doctor.  Dulcolax (bisacodyl) - Pick up over-the-counter and take as directed by the product packaging as needed to assist with the movement of your bowels.  Take with a full glass of water.  Use this product as needed if not relieved by Colace only.   MiraLax (polyethylene glycol) - Pick up over-the-counter to have on hand.  MiraLax is a solution that will increase the amount of water in your bowels to assist with bowel movements.  Take as directed and can mix with a glass of water, juice, soda, coffee, or tea.  Take if you go more than two days without a movement. Do not use MiraLax more than once per day. Call your doctor if you are still constipated or irregular after using this medication for 7 days in a row.  If you continue to have problems with postoperative constipation, please contact the office for further assistance and recommendations.  If you experience "the worst abdominal pain ever" or develop nausea or vomiting, please contact the office immediatly for further recommendations for treatment.  ITCHING  If you experience itching with your medications, try taking only a single pain pill, or even half a pain pill at a time.  You can also use Benadryl over the counter for itching or also to help with sleep.   TED HOSE STOCKINGS Wear the elastic stockings on both legs for three weeks following surgery during the day but you may remove then at night for sleeping.  MEDICATIONS See your medication summary on the "After Visit Summary" that the nursing staff will review with you prior to discharge.  You may have some home medications which will be placed on hold until you complete the course of blood thinner medication.  It is important for you to complete the blood thinner medication as prescribed by your surgeon.  Continue your approved medications as instructed at time of discharge.  PRECAUTIONS If you experience chest pain or shortness of breath - call 911 immediately for transfer to the  hospital emergency department.  If you develop a fever greater that 101 F,  purulent drainage from wound, increased redness or drainage from wound, foul odor from the wound/dressing, or calf pain - CONTACT YOUR SURGEON.                                                   FOLLOW-UP APPOINTMENTS Make sure you keep all of your appointments after your operation with your surgeon and caregivers. You should call the office at the above phone number and make an appointment for approximately two weeks after the date of your surgery or on the date instructed by your surgeon outlined in the "After Visit Summary".   RANGE OF MOTION AND STRENGTHENING EXERCISES  Rehabilitation of the knee is important following a knee injury or an operation. After just a few days of immobilization, the muscles of the thigh which control the knee become weakened and shrink (atrophy). Knee exercises are designed to build up the tone and strength of the thigh muscles and to improve knee motion. Often times heat used for twenty to thirty minutes before working out will loosen up your tissues and help with improving the range of motion but do not use heat for the first two weeks following surgery. These exercises can be done on a training (exercise) mat, on the floor, on a table or on a bed. Use what ever works the best and is most comfortable for you Knee exercises include:  Leg Lifts - While your knee is still immobilized in a splint or cast, you can do straight leg raises. Lift the leg to 60 degrees, hold for 3 sec, and slowly lower the leg. Repeat 10-20 times 2-3 times daily. Perform this exercise against resistance later as your knee gets better.  Quad and Hamstring Sets - Tighten up the muscle on the front of the thigh (Quad) and hold for 5-10 sec. Repeat this 10-20 times hourly. Hamstring sets are done by pushing the foot backward against an object and holding for 5-10 sec. Repeat as with quad sets.  Leg Slides: Lying on your back,  slowly slide your foot toward your buttocks, bending your knee up off the floor (only go as far as is comfortable). Then slowly slide your foot back down until your leg is flat on the floor again. Angel Wings: Lying on your back spread your legs to the side as far apart as you can without causing discomfort.  A rehabilitation program following serious knee injuries can speed recovery and prevent re-injury in the future due to weakened muscles. Contact your doctor or a physical therapist for more information on knee rehabilitation.   IF YOU ARE TRANSFERRED TO A SKILLED REHAB FACILITY If the patient is transferred to a skilled rehab facility following release from the hospital, a list of the current medications will be sent to the facility for the patient to continue.  When discharged from the skilled rehab facility, please have the facility set up the patient's [place] prior to being released. Also, the skilled facility will be responsible for providing the patient with their medications at time of release from the facility to include their pain medication, the muscle relaxants, and their blood thinner medication. If the patient is still at the rehab facility at time of the two week follow up appointment, the skilled rehab facility will also need to assist the patient in arranging follow up appointment  in our office and any transportation needs.  MAKE SURE YOU:  Understand these instructions.  Get help right away if you are not doing well or get worse.    Pick up stool softner and laxative for home use following surgery while on pain medications. Do not submerge incision under water. Please use good hand washing techniques while changing dressing each day. May shower starting three days after surgery. Please use a clean towel to pat the incision dry following showers. Continue to use ice for pain and swelling after surgery. Do not use any lotions or creams on the incision until  instructed by your surgeon.   Increase activity slowly as tolerated   Complete by: As directed      Allergies as of 02/27/2019      Reactions   Nuts Hives, Itching, Swelling   Possible reaction to hazelnuts (tongue swelling)      Medication List    STOP taking these medications   ibuprofen 200 MG tablet Commonly known as: ADVIL   multivitamin with minerals Tabs tablet     TAKE these medications   acetaminophen 500 MG tablet Commonly known as: TYLENOL Take 500-1,000 mg by mouth every 6 (six) hours as needed for mild pain.   docusate sodium 100 MG capsule Commonly known as: COLACE Take 1 capsule (100 mg total) by mouth 2 (two) times daily.   methocarbamol 500 MG tablet Commonly known as: ROBAXIN Take 1 tablet (500 mg total) by mouth every 6 (six) hours as needed for muscle spasms.   ondansetron 4 MG tablet Commonly known as: ZOFRAN Take 1 tablet (4 mg total) by mouth every 6 (six) hours as needed for nausea.   oxyCODONE 5 MG immediate release tablet Commonly known as: Oxy IR/ROXICODONE Take 1-3 tablets (5-15 mg total) by mouth every 6 (six) hours as needed for severe pain.   polyethylene glycol 17 g packet Commonly known as: MIRALAX / GLYCOLAX Take 17 g by mouth daily as needed for mild constipation.   rivaroxaban 10 MG Tabs tablet Commonly known as: XARELTO Take 1 tablet (10 mg total) by mouth daily.   rosuvastatin 5 MG tablet Commonly known as: CRESTOR TAKE ONE TABLET EACH DAY What changed: See the new instructions.   traMADol 50 MG tablet Commonly known as: ULTRAM Take 1-2 tablets (50-100 mg total) by mouth every 6 (six) hours as needed for moderate pain.   zolpidem 10 MG tablet Commonly known as: AMBIEN TAKE ONE TABLET AT BEDTIME AS NEEDED What changed: reasons to take this      Follow-up Information    Edmisten, Ok Anis, PA. Go on 03/10/2019.   Specialty: Orthopedic Surgery Why: You are scheduled for a post-operative appointment on 03-10-19 at  2:30 pm. Contact information: [address] [place] [address] [id number]        Rosilyn Mings.. Go on 02/28/2019.   Why: You are scheduled for a physical therapy appointment on 02-28-19 at 1:45 pm.  Contact information: [place] [address] [id number]           Signed: Griffith Citron, PA-C Orthopedic Surgery 03/02/2019, 11:01 AM

## 2019-03-02 NOTE — Patient Outreach (Signed)
[place] [facility]) Care Management  03/02/2019  MAZI RAUSCH Dec 28, 1940 [id number]   EMMI Red Alert : General Discharge  Day#: 1 Date : 03/01/19 Red Alert Reason : Know who to call about changes in condition: Answer NO.     Subjective; Unsuccessful outreach call to patient preferred contact number,no answer able to leave a HIPAA compliant voice mail message requesting a return call.    Plan  Will plan return call in the next 4 business days.  Will send [facility] patient unsuccessful outreach letter.    Joylene Draft, RN, [place] Management Coordinator  [phone]- Mobile [phone]- Toll Free Main Office

## 2019-03-07 ENCOUNTER — Other Ambulatory Visit: Payer: Self-pay | Admitting: *Deleted

## 2019-03-07 NOTE — Patient Outreach (Signed)
[place] [facility]) Care Management  03/07/2019  Alexander [name] Jul 10, 1940 [id number]   EMMI Red Alert : General Discharge  Day#: 1 Date : 03/01/19 Red Alert Reason : Know who to call about changes in condition: Answer NO.   Subjective: Successful outreach call to patient , explained reason for the call to patient and automated EMMI call, HIPAA confirmed x 2 identifiers, he is agreeable to assessment . Discussed EMMI red alert call, patient states that he knows who to call if problems. 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  He states he is doing good and no other needs or concerns at this time.   Plan  Will close case to [facility] care management at this time , no other care needs identified at this time.  Will send [facility] successful patient  outreach letter    Joylene Draft, RN, [place] Management Coordinator  [phone]- Mobile [phone]- [place]

## 2019-03-14 ENCOUNTER — Emergency Department (HOSPITAL_COMMUNITY): Payer: Medicare Other

## 2019-03-14 ENCOUNTER — Other Ambulatory Visit: Payer: Self-pay

## 2019-03-14 ENCOUNTER — Ambulatory Visit (INDEPENDENT_AMBULATORY_CARE_PROVIDER_SITE_OTHER): Payer: Medicare Other | Admitting: Cardiology

## 2019-03-14 ENCOUNTER — Telehealth: Payer: Self-pay | Admitting: Cardiology

## 2019-03-14 ENCOUNTER — Emergency Department (HOSPITAL_COMMUNITY)
Admission: EM | Admit: 2019-03-14 | Discharge: 2019-03-14 | Disposition: A | Discharge status: Home / Self Care | Payer: Medicare Other | Attending: Emergency Medicine | Admitting: Emergency Medicine

## 2019-03-14 ENCOUNTER — Encounter (HOSPITAL_COMMUNITY): Payer: Self-pay | Admitting: Emergency Medicine

## 2019-03-14 ENCOUNTER — Encounter: Payer: Self-pay | Admitting: Cardiology

## 2019-03-14 ENCOUNTER — Ambulatory Visit (HOSPITAL_BASED_OUTPATIENT_CLINIC_OR_DEPARTMENT_OTHER): Payer: Medicare Other

## 2019-03-14 VITALS — BP 122/50 | HR 100 | Ht 69.0 in | Wt 165.0 lb

## 2019-03-14 DIAGNOSIS — Z79899 Other long term (current) drug therapy: Secondary | ICD-10-CM | POA: Diagnosis not present

## 2019-03-14 DIAGNOSIS — I10 Essential (primary) hypertension: Secondary | ICD-10-CM

## 2019-03-14 DIAGNOSIS — I341 Nonrheumatic mitral (valve) prolapse: Secondary | ICD-10-CM | POA: Insufficient documentation

## 2019-03-14 DIAGNOSIS — R0600 Dyspnea, unspecified: Secondary | ICD-10-CM | POA: Diagnosis not present

## 2019-03-14 DIAGNOSIS — R0602 Shortness of breath: Secondary | ICD-10-CM

## 2019-03-14 DIAGNOSIS — R0609 Other forms of dyspnea: Secondary | ICD-10-CM

## 2019-03-14 DIAGNOSIS — Z96652 Presence of left artificial knee joint: Secondary | ICD-10-CM | POA: Diagnosis not present

## 2019-03-14 DIAGNOSIS — Z20822 Contact with and (suspected) exposure to covid-19: Secondary | ICD-10-CM | POA: Diagnosis not present

## 2019-03-14 DIAGNOSIS — R079 Chest pain, unspecified: Secondary | ICD-10-CM | POA: Diagnosis not present

## 2019-03-14 DIAGNOSIS — R0789 Other chest pain: Secondary | ICD-10-CM | POA: Insufficient documentation

## 2019-03-14 DIAGNOSIS — I340 Nonrheumatic mitral (valve) insufficiency: Secondary | ICD-10-CM

## 2019-03-14 DIAGNOSIS — Z950 Presence of cardiac pacemaker: Secondary | ICD-10-CM | POA: Insufficient documentation

## 2019-03-14 LAB — CBC
HCT: 37.7 % — ABNORMAL LOW (ref 39.0–52.0)
Hemoglobin: 12.3 g/dL — ABNORMAL LOW (ref 13.0–17.0)
MCH: 31.6 pg (ref 26.0–34.0)
MCHC: 32.6 g/dL (ref 30.0–36.0)
MCV: 96.9 fL (ref 80.0–100.0)
Platelets: 411 10*3/uL — ABNORMAL HIGH (ref 150–400)
RBC: 3.89 MIL/uL — ABNORMAL LOW (ref 4.22–5.81)
RDW: 13.4 % (ref 11.5–15.5)
WBC: 8.6 10*3/uL (ref 4.0–10.5)
nRBC: 0 % (ref 0.0–0.2)

## 2019-03-14 LAB — ECHOCARDIOGRAM LIMITED
Height: 69 in
Weight: 2640 oz

## 2019-03-14 LAB — BASIC METABOLIC PANEL
Anion gap: 8 (ref 5–15)
BUN: 24 mg/dL — ABNORMAL HIGH (ref 8–23)
CO2: 24 mmol/L (ref 22–32)
Calcium: 9.5 mg/dL (ref 8.9–10.3)
Chloride: 106 mmol/L (ref 98–111)
Creatinine, Ser: 1 mg/dL (ref 0.61–1.24)
GFR calc Af Amer: >60 mL/min (ref >60)
GFR calc non Af Amer: >60 mL/min (ref >60)
Glucose, Bld: 96 mg/dL (ref 70–99)
Potassium: 4.2 mmol/L (ref 3.5–5.1)
Sodium: 138 mmol/L (ref 135–145)

## 2019-03-14 LAB — TROPONIN I (HIGH SENSITIVITY)
Troponin I (High Sensitivity): 7 ng/L (ref <18)
Troponin I (High Sensitivity): 7 ng/L (ref <18)

## 2019-03-14 LAB — RESPIRATORY PANEL BY RT PCR (FLU A&B, COVID)
Influenza A by PCR: NEGATIVE
Influenza B by PCR: NEGATIVE
SARS Coronavirus 2 by RT PCR: NEGATIVE

## 2019-03-14 MED ORDER — SODIUM CHLORIDE 0.9% FLUSH: 3.0000 mL | Freq: Once | INTRAVENOUS | Status: DC

## 2019-03-14 MED ORDER — IOHEXOL 350 MG/ML SOLN
80.0000 mL | Freq: Once | INTRAVENOUS | Status: AC | PRN
  Administered 2019-03-14: 16:00:00 62 mL via INTRAVENOUS

## 2019-03-14 NOTE — Telephone Encounter (Signed)
Pt was seen today by Dr Marlou Porch and sent to ER.

## 2019-03-14 NOTE — ED Provider Notes (Signed)
[facility] EMERGENCY DEPARTMENT Provider Note   CSN: [id number] Arrival date & time: 03/14/19  1302     History Chief Complaint  Patient presents with  . Sent by Cardiology  . Shortness of Breath  . Chest Pain    Alexander [name] is a 79 y.o. male.  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 Wynelle Link 02/27/2019 presents from Cardiology office after seeing Dr. Marlou Porch with concern for dyspnea on exertion. Reports over the last few days he has had dyspnea, noting it when he walks approximately 50 feet. Had an episode when walking up the stairs when he experienced indigestion in the chest. Otherwise has not had any exertional chest pain.  Denies any current chest pain or dyspnea. No cough, no fever, no nausea, vomiting nor abdominal pain.   No leg pain or swelling with exception of knee related to recent surgery.  Is taking prophylactic xarelto after knee sugery for a few more days.  No prior history of CAD. No hx of smoking.  Dr. Marlou Porch sent to ED for evaluation of dyspnea.   Past Medical History:  Diagnosis Date  . Hyperlipidemia   . Hypertension   . MVP (mitral valve prolapse)    mild  . Syncope     Patient Active Problem List   Diagnosis Date Noted  . OA (osteoarthritis) of knee 02/23/2019  . Osteoarthritis of left knee 02/23/2019  . Sinus arrest   . Hypertension   . Syncope   . Left lower lobe pulmonary nodule 05/06/2017  . Right bundle branch block 08/01/2010  . MVP (mitral valve prolapse)   . Hyperlipidemia     Past Surgical History:  Procedure Laterality Date  . HERNIA REPAIR    . PACEMAKER IMPLANT N/A 10/12/2017   Procedure: PACEMAKER IMPLANT;  Surgeon: Constance Haw, MD;  Location: [facility];  Service: Cardiovascular;  Laterality: N/A;  . TONSILECTOMY, ADENOIDECTOMY, BILATERAL MYRINGOTOMY AND TUBES       as child  . TOTAL KNEE ARTHROPLASTY Left   . TOTAL KNEE ARTHROPLASTY Left 02/23/2019   Procedure: TOTAL KNEE ARTHROPLASTY;  Surgeon: Gaynelle Arabian, MD;  Location: [facility];  Service: Orthopedics;  Laterality: Left;  44min       Family History  Problem Relation Age of Onset  . Alzheimer's disease Mother   . Hypertension Father     Social History   Tobacco Use  . Smoking status: Never Smoker  . Smokeless tobacco: Never Used  Substance Use Topics  . Alcohol use: Yes    Alcohol/week: 5.0 standard drinks    Types: 5 Glasses of wine per week  . Drug use: No    Home Medications Prior to Admission medications   Medication Sig Start Date End Date Taking? Authorizing Provider  acetaminophen (TYLENOL) 500 MG tablet Take 500-1,000 mg by mouth every 6 (six) hours as needed for mild pain.    [provider]  docusate sodium (COLACE) 100 MG capsule Take 1 capsule (100 mg total) by mouth 2 (two) times daily. 02/24/19   Constable, Amber, PA-C  methocarbamol (ROBAXIN) 500 MG tablet Take 1 tablet (500 mg total) by mouth every 6 (six) hours as needed for muscle spasms. 02/24/19   Constable, Amber, PA-C  ondansetron (ZOFRAN) 4 MG tablet Take 1 tablet (4 mg total) by mouth every 6 (six) hours as needed for nausea. 02/24/19  Cecilio Asper, Museum/gallery conservator, PA-C  oxyCODONE (OXY IR/ROXICODONE) 5 MG immediate release tablet Take 1-3 tablets (5-15 mg total) by mouth every 6 (six) hours as needed for severe pain. 02/24/19   Constable, Amber, PA-C  polyethylene glycol (MIRALAX / GLYCOLAX) 17 g packet Take 17 g by mouth daily as needed for mild constipation. 02/24/19   Constable, Amber, PA-C  rivaroxaban (XARELTO) 10 MG TABS tablet Take 1 tablet (10 mg total) by mouth daily. 02/24/19   Constable, Amber, PA-C  zolpidem (AMBIEN) 10 MG tablet TAKE ONE TABLET AT BEDTIME AS NEEDED 05/02/15   Darlin Coco, MD    Allergies    Nuts  Review of Systems   Review of Systems  Constitutional: Negative for fever.   Eyes: Negative for visual disturbance.  Respiratory: Positive for shortness of breath. Negative for cough.   Cardiovascular: Negative for chest pain (not current, had brief episode at home) and leg swelling.  Gastrointestinal: Negative for abdominal pain, nausea and vomiting.  Genitourinary: Negative for difficulty urinating.  Musculoskeletal: Positive for arthralgias.  Skin: Negative for rash.  Neurological: Negative for syncope and headaches.    Physical Exam Updated Vital Signs BP 131/87 (BP Location: Right Arm)   Pulse 99   Temp (!) 97.5 F (36.4 C) (Oral)   Resp 16   Ht 5' 9.5" (1.765 m)   Wt 74.8 kg   SpO2 98%   BMI 24.02 kg/m   Physical Exam Vitals and nursing note reviewed.  Constitutional:      General: He is not in acute distress.    Appearance: He is well-developed. He is not diaphoretic.  HENT:     Head: Normocephalic and atraumatic.  Eyes:     Conjunctiva/sclera: Conjunctivae normal.  Cardiovascular:     Rate and Rhythm: Normal rate and regular rhythm.     Heart sounds: Normal heart sounds. No murmur. No friction rub. No gallop.   Pulmonary:     Effort: Pulmonary effort is normal. No respiratory distress.     Breath sounds: Normal breath sounds. No wheezing or rales.  Abdominal:     General: There is no distension.     Palpations: Abdomen is soft.     Tenderness: There is no abdominal tenderness. There is no guarding.  Musculoskeletal:     Cervical back: Normal range of motion.     Right lower leg: No edema.     Left lower leg: No edema.  Skin:    General: Skin is warm and dry.  Neurological:     Mental Status: He is alert and oriented to person, place, and time.     ED Results / Procedures / Treatments   Labs (all labs ordered are listed, but only abnormal results are displayed) Labs Reviewed  BASIC METABOLIC PANEL - Abnormal; Notable for the following components:      Result Value   BUN 24 (*)    All other components within normal limits   CBC - Abnormal; Notable for the following components:   RBC 3.89 (*)    Hemoglobin 12.3 (*)    HCT 37.7 (*)    Platelets 411 (*)    All other components within normal limits  RESPIRATORY PANEL BY RT PCR (FLU A&B, COVID)  TROPONIN I (HIGH SENSITIVITY)  TROPONIN I (HIGH SENSITIVITY)  TROPONIN I (HIGH SENSITIVITY)  TROPONIN I (HIGH SENSITIVITY)    EKG EKG Interpretation  Date/Time:  Monday March 14 2019 13:53:18 EST Ventricular Rate:  78 PR Interval:  146 QRS Duration: 120  QT Interval:  396 QTC Calculation: 451 R Axis:   91 Text Interpretation: Normal sinus rhythm Right bundle branch block Confirmed by Lajean Saver [id number]) on 03/14/2019 3:34:26 PM   Radiology DG Chest 2 View  Result Date: 03/14/2019 CLINICAL DATA:  79 year old male with chest pain and shortness of breath for 2 days. Negative for COVID-19 3 weeks ago. EXAM: CHEST - 2 VIEW COMPARISON:  Chest CT 05/03/2018 and earlier. FINDINGS: Stable left chest dual lead cardiac pacemaker. Normal cardiac size and mediastinal contours. Visualized tracheal air column is within normal limits. Somewhat large lung volumes. Both lungs are clear. No pneumothorax or pleural effusion. No acute osseous abnormality identified. Negative visible bowel gas pattern. IMPRESSION: No acute cardiopulmonary abnormality. Electronically Signed   By: Genevie Ann M.D.   On: 03/14/2019 14:48   CT Angio Chest PE W/Cm &/Or Wo Cm  Result Date: 03/14/2019 CLINICAL DATA:  Short of breath. Twenty days post left knee replacement. EXAM: CT ANGIOGRAPHY CHEST WITH CONTRAST TECHNIQUE: Multidetector CT imaging of the chest was performed using the standard protocol during bolus administration of intravenous contrast. Multiplanar CT image reconstructions and MIPs were obtained to evaluate the vascular anatomy. CONTRAST:  74mL OMNIPAQUE IOHEXOL 350 MG/ML SOLN COMPARISON:  Chest two-view 03/14/2019 FINDINGS: Cardiovascular: Negative for pulmonary embolism. Pulmonary arteries  normal in caliber. No aortic aneurysm. Mild coronary artery calcification. No pericardial effusion. Heart size normal. Left ventricular dilatation. Dual lead pacemaker noted. Mediastinum/Nodes: Negative for mass or adenopathy. Lungs/Pleura: Lungs are clear.  No infiltrate effusion or mass. Upper Abdomen: Negative Musculoskeletal: Negative Review of the MIP images confirms the above findings. IMPRESSION: Negative for pulmonary embolism.  No acute abnormality in the chest Mild coronary calcification. Electronically Signed   By: Franchot Gallo M.D.   On: 03/14/2019 16:12   ECHOCARDIOGRAM LIMITED  Result Date: 03/14/2019   ECHOCARDIOGRAM LIMITED REPORT   Patient Name:   LILA HRABE Date of Exam: 03/14/2019 Medical Rec #:  [id number]          Height:       69.0 in Accession #:    [id number]         Weight:       165.0 lb Date of Birth:  [birth year]          BSA:          1.90 m Patient Age:    66 years           BP:           122/50 mmHg Patient Gender: M                  HR:           71 bpm. Exam Location:  [address]  Procedure: Limited Echo, Cardiac Doppler and Color Doppler Indications:    R06.02 Shortness of breath. I34.1 MVP. LIMITED to evaluate MVP.  History:        Patient has prior history of Echocardiogram examinations, most                 recent 02/10/2019.  Sonographer:    Jessee Avers, RDCS Referring Phys: Candee Furbish MD IMPRESSIONS  1. Left ventricular ejection fraction, by visual estimation, is 55 to 60%. There is no increased left ventricular wall thickness.  2. Left ventricular diastolic parameters are consistent with Grade I diastolic dysfunction (impaired relaxation).  3. Mild to moderately dilated left ventricular internal cavity size.  4. Global right ventricle has normal systolc  function.The right ventricular size is normal. no increase in right ventricular wall thickness.  5. Moderate mitral valve prolapse.  6. Moderate thickening of the mitral valve leaflet(s).  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  8. Normal pulmonary artery systolic pressure.  9. The inferior vena cava is normal in size with greater than 50% respiratory variability, suggesting right atrial pressure of 3 mmHg. 10. A prior study was performed on 02/10/2019. 11. No significant change from prior study. 12. Prior echo: LVEF 55-60%, moderate MR with MVP. FINDINGS  Left Ventricle: Left ventricular ejection fraction, by visual estimation, is 55 to 60%. The left ventricular internal cavity size was mildly to moderately dilated left ventricle. There is no increased left ventricular wall thickness. Left ventricular diastolic parameters are consistent with Grade I diastolic dysfunction (impaired relaxation). Indeterminate filling pressures. Right Ventricle: The right ventricular size is normal. No increase in right ventricular wall thickness. Global RV systolic function is has normal systolic function. The tricuspid regurgitant velocity is 2.34 m/s, and with an assumed right atrial pressure  of 3 mmHg, the estimated right ventricular systolic pressure is normal at 25.0 mmHg. Mitral Valve: The mitral valve is myxomatous. There is moderate late systolic prolapse of of the mitral valve. There is moderate thickening of the mitral valve leaflet(s). MV Area by PHT, 2.48 cm. MV PHT, 88.74 msec. Moderate mitral valve regurgitation,  with eccentric posteriorly directed jet. Venous: The inferior vena cava is normal in size with greater than 50% respiratory variability, suggesting right atrial pressure of 3 mmHg. Additional Comments: A prior study was performed on 02/10/2019.  LEFT VENTRICLE         Normals PLAX 2D LVIDd:         6.30 cm 3.6 cm   Diastology                 Normals LVIDs:         4.10 cm 1.7 cm   LV e' lateral:   9.14 cm/s 6.42 cm/s LV PW:         0.90 cm 1.4 cm   LV E/e' lateral: 8.3       15.4 LV IVS:        0.90 cm 1.3 cm   LV e' medial:    5.44 cm/s 6.96 cm/s LV SV:         127 ml  79 ml    LV  E/e' medial:  14.0      6.96 LV SV Index:   66.27   45 ml/m2  RIGHT VENTRICLE RVSP:           25.0 mmHg LEFT ATRIUM         Index      RIGHT ATRIUM LA diam:    4.20 cm 2.21 cm/m RA Pressure: 3.00 mmHg   AORTA                 Normals Ao Root diam: 3.70 cm 31 mm MITRAL VALVE            Normals     TRICUSPID VALVE             Normals                                     TR Peak grad:   22.0 mmHg  55 ms       TR Vmax:        236.00 cm/s 288 cm/s MV Decel Time: 306 msec 187 ms      Estimated RAP:  3.00 mmHg MV E velocity: 76.30 cm/s 103 cm/s  RVSP:           25.0 mmHg MV A velocity: 70.70 cm/s 70.3 cm/s MV E/A ratio:  1.08       1.5  Lyman Bishop MD Electronically signed by Lyman Bishop MD Signature Date/Time: 03/14/2019/4:15:55 PMThe mitral valve is myxomatous.    Final     Procedures Procedures (including critical care time)  Medications Ordered in ED Medications  iohexol (OMNIPAQUE) 350 MG/ML injection 80 mL (62 mLs Intravenous Contrast Given 03/14/19 1559)    ED Course  I have reviewed the triage vital signs and the nursing notes.  Pertinent labs & imaging results that were available during my care of the patient were reviewed by me and considered in my medical decision making (see chart for details).    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 Wynelle Link 02/27/2019 presents from Cardiology office after seeing Dr. Marlou Porch with concern for dyspnea on exertion.  CT PE study without signs of acute abnormalities. Delta troponins 7.  No symptoms while in the ED.  Discussed with his Cardiologist, Dr. [name] and agree with outpatient evaluation of dyspnea on exertion and discussed reasons to return. Will send COVID testing.    Final Clinical Impression(s) / ED Diagnoses Final diagnoses:  Dyspnea on exertion     Rx / DC Orders ED Discharge Orders    None       Gareth Morgan, MD 03/15/19 1328

## 2019-03-14 NOTE — Telephone Encounter (Signed)
Daughter Caryl Pina) answered patient contact #. Listed on DPR. Will have patient send manual transmission before appointment for review. Device clinic # provided if patient needs assistance with sending transmission.

## 2019-03-14 NOTE — ED Triage Notes (Signed)
Pt being sent by Dr. Marlou Porch Cards for r/o of PE due to post 20 days left knee replacement. The patient has had sudden onset of chest pain with shortness of breath when ambulating. Taking Xarlelto. A/O x4 at triage.

## 2019-03-14 NOTE — ED Notes (Signed)
ED Provider at bedside. Dr. Ashok Cordia at triage.

## 2019-03-14 NOTE — Progress Notes (Signed)
Cardiology Office Note:    Date:  03/14/2019   ID:  Alexander [name], DOB May 13, 1940, MRN [id number]  PCP:  [name], MD  Cardiologist:  [name], MD  Electrophysiologist:  Constance Haw, MD   Referring MD: [name], MD     History of Present Illness:    Alexander [name] is a 79 y.o. male with a hx of recent knee replacement, discharged on 02/27/2019 here for evaluation of shortness of breath.  He is seen by Dr. 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  He is to be followed by Dr. Mare Ferrari for several years.  Retired in April 2019.  He had 3 episodes previously of syncope.  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 Curt Bears placed a dual-chamber pacemaker in 2019.  Good hydration.  Last visit with Dr. [name] was because of an episode of sudden onset shortness of breath.  Prior EKG shows right bundle branch block unchanged from prior with atrial paced rhythm.  Echocardiogram from 02/13/2019 shows EF of 60% with mild to moderate mitral regurgitation.  Moderately elevated pulmonary systolic pressure.  Myxomatous valve.  One day sudden SOB 5 days ago, with decreased exercise tolerance. No CP. Daughter MD in urgent care HR normal at the time. Lungs clear. Last night going up stairs felt bad heart burn lasted about 2 minute, SSCP.   Sitting here originally, his heart rate was approxione 100 bpm.  No CAD non smoker. On Xarelto post op knee for 3 weeks.   Past Medical History:  Diagnosis Date  . Hyperlipidemia   . Hypertension   . MVP (mitral valve prolapse)    mild  . Syncope     Past Surgical History:  Procedure Laterality Date  . HERNIA REPAIR    . PACEMAKER IMPLANT N/A 10/12/2017   Procedure: PACEMAKER IMPLANT;  Surgeon: Constance Haw, MD;  Location: [facility];  Service: Cardiovascular;   Laterality: N/A;  . TONSILECTOMY, ADENOIDECTOMY, BILATERAL MYRINGOTOMY AND TUBES     as child  . TOTAL KNEE ARTHROPLASTY Left   . TOTAL KNEE ARTHROPLASTY Left 02/23/2019   Procedure: TOTAL KNEE ARTHROPLASTY;  Surgeon: Gaynelle Arabian, MD;  Location: [facility];  Service: Orthopedics;  Laterality: Left;  58min    Current Medications: Current Meds  Medication Sig  . acetaminophen (TYLENOL) 500 MG tablet Take 500-1,000 mg by mouth every 6 (six) hours as needed for mild pain.  Marland Kitchen docusate sodium (COLACE) 100 MG capsule Take 1 capsule (100 mg total) by mouth 2 (two) times daily.  . methocarbamol (ROBAXIN) 500 MG tablet Take 1 tablet (500 mg total) by mouth every 6 (six) hours as needed for muscle spasms.  . ondansetron (ZOFRAN) 4 MG tablet Take 1 tablet (4 mg total) by mouth every 6 (six) hours as needed for nausea.  Marland Kitchen oxyCODONE (OXY IR/ROXICODONE) 5 MG immediate release tablet Take 1-3 tablets (5-15 mg total) by mouth every 6 (six) hours as needed for severe pain.  . polyethylene glycol (MIRALAX / GLYCOLAX) 17 g packet Take 17 g by mouth daily as needed for mild constipation.  . rivaroxaban (XARELTO) 10 MG TABS tablet Take 1 tablet (10 mg total) by mouth daily.  Marland Kitchen zolpidem (AMBIEN) 10 MG tablet TAKE ONE TABLET AT BEDTIME AS NEEDED     Allergies:   Nuts   Social History   Socioeconomic History  . Marital  status: Married    Spouse name: Not on file  . Number of children: Not on file  . Years of education: Not on file  . Highest education level: Not on file  Occupational History  . Not on file  Tobacco Use  . Smoking status: Never Smoker  . Smokeless tobacco: Never Used  Substance and Sexual Activity  . Alcohol use: Yes    Alcohol/week: 5.0 standard drinks    Types: 5 Glasses of wine per week  . Drug use: No  . Sexual activity: Not on file  Other Topics Concern  . Not on file  Social History Narrative  . Not on file   Social Determinants of Health   Financial Resource Strain:   .  Difficulty of Paying Living Expenses: Not on file  Food Insecurity:   . Worried About Charity fundraiser in the Last Year: Not on file  . Ran Out of Food in the Last Year: Not on file  Transportation Needs:   . Lack of Transportation (Medical): Not on file  . Lack of Transportation (Non-Medical): Not on file  Physical Activity:   . Days of Exercise per Week: Not on file  . Minutes of Exercise per Session: Not on file  Stress:   . Feeling of Stress : Not on file  Social Connections:   . Frequency of Communication with Friends and Family: Not on file  . Frequency of Social Gatherings with Friends and Family: Not on file  . Attends Religious Services: Not on file  . Active Member of Clubs or Organizations: Not on file  . Attends Archivist Meetings: Not on file  . Marital Status: Not on file     Family History: The patient's family history includes Alzheimer's disease in his mother; Hypertension in his father.  ROS:   Please see the history of present illness.    Denies any fevers chills nausea all other systems reviewed and are negative.  EKGs/Labs/Other Studies Reviewed:    The following studies were reviewed today: Prior medical records prior echocardiogram, prior office notes.  Lab work from hospital reviewed as below.  EKG:  EKG is ordered today.  The ekg ordered today demonstrates sinus rhythm right bundle branch block PVC, T waves peaked similar to prior.  T wave inversion in inferior leads similar to prior.  Recent Labs: 02/10/2019: NT-Pro BNP 139; TSH 1.310 02/22/2019: ALT 23 02/25/2019: BUN 17; Creatinine, Ser 0.85; Potassium 4.0; Sodium 136 02/26/2019: Hemoglobin 11.0; Platelets 144  Recent Lipid Panel    Component Value Date/Time   CHOL 169 02/10/2019 1429   TRIG 80 02/10/2019 1429   HDL 81 02/10/2019 1429   CHOLHDL 2.1 02/10/2019 1429   LDLCALC 73 02/10/2019 1429    Physical Exam:    VS:  BP (!) 122/50   Pulse 100   Ht 5\' 9"  (1.753 m)   Wt  165 lb (74.8 kg)   SpO2 97%   BMI 24.37 kg/m     Wt Readings from Last 3 Encounters:  03/14/19 165 lb (74.8 kg)  02/23/19 174 lb 2.6 oz (79 kg)  02/22/19 171 lb 4.8 oz (77.7 kg)     GEN:  Well nourished, well developed in no acute distress, mild shortness of breath noted with activity seems to be fairly comfortable sitting down HEENT: Normal NECK: No JVD; No carotid bruits LYMPHATICS: No lymphadenopathy CARDIAC: RRR, harsh 4/6 systolic apical murmur, rubs, gallops RESPIRATORY:  Clear to auscultation without rales, wheezing or  rhonchi  ABDOMEN: Soft, non-tender, non-distended MUSCULOSKELETAL: Left knee replacement noted, minimal edema lower extremity, knee edema noted, soft calf bilaterally SKIN: Warm and dry NEUROLOGIC:  Alert and oriented x 3 PSYCHIATRIC:  Normal affect   ASSESSMENT:    1. SOB (shortness of breath)   2. MVP (mitral valve prolapse)   3. Essential hypertension   4. Chest pain of uncertain etiology   5. Mitral valve insufficiency, unspecified etiology   6. Cardiac pacemaker in situ    PLAN:    In order of problems listed above:  Sudden onset shortness of breath, decreased exercise tolerance, substernal chest discomfort discharged from hospital after left knee replacement, discharged on 02/27/2019.  Known myxomatous mitral valve prolapse with previously described mild to moderate mitral regurgitation -EKG performed does not show any ST elevation or Q waves.  Known right bundle branch block pattern.  T waves are somewhat peaked in V3 however this was seen on a prior EKG as well.  T wave inversion in the inferior leads was also seen on the prior EKG. -We will go ahead and get an urgent echocardiogram to ensure proper structure and function.  Harsh murmur heard previously by Dr. [name] as well.  I personally reviewed his echocardiogram and his mitral regurgitation is still in the what appears to be moderate category although fairly eccentric.  EF in the 55 to 60%  range.  Pulmonary pressures estimated normal.  Normal IVC.  RV overall appeared normal.  Eventually, TEE would be helpful for him. -After discussion with he and daughter, I will send him down to emergency department for exclusion of pulmonary embolism with CT scan, last creatinine 0.85 on 02/25/2019.  Last hemoglobin 11 on 02/26/2019.  He should have these repeated.  His heart rate currently is 100, on EKG when laying down was 75.  With his recent knee surgery, we want to exclude PE is a possibility.  Note, he has not missed any of his prophylactic dose Xarelto. -I also think it would make sense for [place] to check a 0-hour high-sensitivity troponin and a delta 2-hour troponin.  His chest pain was last evening.  If myocardial infarction were to occur, I would expect it to be positive.  He has no prior history of CAD.  I made a phone call into the emergency room triage and spoke to [place] about plan.  Total time spent in discussion with patient, his daughter who is also an MD, review of medical records, organization and ordering of further testing was 70 minutes.    Medication Adjustments/Labs and Tests Ordered: Current medicines are reviewed at length with the patient today.  Concerns regarding medicines are outlined above.  Orders Placed This Encounter  Procedures  . EKG 12-Lead  . ECHOCARDIOGRAM LIMITED   No orders of the defined types were placed in this encounter.   Patient Instructions  Medication Instructions:  The current medical regimen is effective;  continue present plan and medications.  *If you need a refill on your cardiac medications before your next appointment, please call your pharmacy*  Testing/Procedures: Your physician has requested that you have an limited echocardiogram today. Echocardiography is a painless test that uses sound waves to create images of your heart. It provides your doctor with information about the size and shape of your heart and how well your heart's  chambers and valves are working. This procedure takes approximately one hour. There are no restrictions for this procedure.  Follow-Up: At [facility], you and your health needs  are our priority.  As part of our continuing mission to provide you with exceptional heart care, we have created designated Provider Care Teams.  These Care Teams include your primary Cardiologist (physician) and Advanced Practice Providers (APPs -  Physician Assistants and Nurse Practitioners) who all work together to provide you with the care you need, when you need it.  Your next appointment:   2 month(s)  The format for your next appointment:   In Person  Provider:   [name], MD  Other Instructions Please report to the Emergency Room for further evaluation.     Signed, Candee Furbish, MD  03/14/2019 12:30 PM    [facility]

## 2019-03-14 NOTE — Telephone Encounter (Signed)
New message  Pt c/o Shortness Of Breath: STAT if SOB developed within the last 24 hours or pt is noticeably SOB on the phone  1. Are you currently SOB (can you hear that pt is SOB on the phone)? No   2. How long have you been experiencing SOB? Per patient's daughter has had for about a week  3. Are you SOB when sitting or when up moving around? Moving around  4. Are you currently experiencing any other symptoms?Heart burn

## 2019-03-14 NOTE — Telephone Encounter (Signed)
Called patient's daughter (DPR) about patient's mychart message and phone call. Patient's daughter (ER physician) stated patient has decreased exercise tolerance, SOB for a week with activity, and heartburn when walking up stairs. Patient recently had total knee replacement on 02/23/19 and is talking xarelto. Patient's daughter reported that patient's HR 60 to 17 and regular. Patient does have a pacemaker. Patient's daughter was wanting patient to be seen today if possible. Consulted Dr. Marlou Porch, DOD, he agreed to see patient today. Patient coming to see DOD at 10:40 today. Will send message to device to see if there is anything they can rule out with resent transmissions.

## 2019-03-14 NOTE — Patient Instructions (Signed)
Medication Instructions:  The current medical regimen is effective;  continue present plan and medications.  *If you need a refill on your cardiac medications before your next appointment, please call your pharmacy*  Testing/Procedures: Your physician has requested that you have an limited echocardiogram today. Echocardiography is a painless test that uses sound waves to create images of your heart. It provides your doctor with information about the size and shape of your heart and how well your heart's chambers and valves are working. This procedure takes approximately one hour. There are no restrictions for this procedure.  Follow-Up: At [facility], you and your health needs are our priority.  As part of our continuing mission to provide you with exceptional heart care, we have created designated Provider Care Teams.  These Care Teams include your primary Cardiologist (physician) and Advanced Practice Providers (APPs -  Physician Assistants and Nurse Practitioners) who all work together to provide you with the care you need, when you need it.  Your next appointment:   2 month(s)  The format for your next appointment:   In Person  Provider:   [name], MD  Other Instructions Please report to the Emergency Room for further evaluation.

## 2019-03-15 ENCOUNTER — Telehealth: Payer: Self-pay | Admitting: *Deleted

## 2019-03-15 ENCOUNTER — Ambulatory Visit: Payer: Medicare Other | Admitting: Cardiology

## 2019-03-15 NOTE — Telephone Encounter (Signed)
I have reviewed his echocardiogram and in fact I think that he has mitral regurgitation is worse and at least moderate, his shortness of breath and worsening functional capacity and fatigue might be related to worsening mitral regurgitation.  I would suggest that once he feels like he has recovered from his knee surgery we should set up a TEE to evaluate his mitral valve better and if severe then referred to Dr. [name] for repair.  Regarding his Xarelto he should stop after 3 weeks as instructed by Dr. Maureen Ralphs, however he should be on aspirin for the rest of his life as he has evidence of coronary artery disease on his calcium score scan.  Please have him call us when he is ready to get TEE we will arrange and I can see him in the office shortly after and arranged rest of it.

## 2019-03-15 NOTE — Telephone Encounter (Signed)
Pts wife wanted to let Dr. [name] know that the pt saw Dr. Marlou Porch yesterday on his DOD day, for complaints of DOE. Pt had an echo done, and was sent to the ER for evaluation of possible PE, post total knee replacement 3 weeks ago. Wife states that all acute cardiac issues were ruled out, pt had no PE, and his echo was stable when compared to his last one he had done in our office in December.  Wife states that Dr. [name] can view all labs, test, Dr. Evon Slack note, and ER note in Epic. Wife states the pt is home and resting comfortably today.   Wife also wanted to get Dr. Francesca Oman input on the pts Xarelto transitioning to baby ASA tomorrow, as advised by Dr. Wynelle Link, from recent total knee replacement surgery pt had 3 weeks ago. Wife states that the pt had total knee replacement and is at his 3 week mark for this.  Wife states that starting tomorrow, Dr. Wynelle Link gave the pt instructions at discharge from his surgery to stop his Xarelto tomorrow 03/16/19, and start taking ASA 81 mg po BID for 3 weeks, then stop baby ASA all together thereafter.  Wife wants to get Dr. Francesca Oman input on this, and see if she agrees with this medication transition?  Wife wants to make sure that this will be a safe transition from a cardiac perspective.  Informed the pts wife that I will route this information to Dr. [name] to review and advise on, and I will follow-up with her shortly thereafter.  Wife verbalized understanding and agrees with this plan.

## 2019-03-16 DIAGNOSIS — M25562 Pain in left knee: Secondary | ICD-10-CM | POA: Diagnosis not present

## 2019-03-16 NOTE — Telephone Encounter (Signed)
See telephone encounter from today, regarding this matter.

## 2019-03-16 NOTE — Telephone Encounter (Signed)
Spoke with the pts wife and endorsed message and recommendations per Dr. [name], as mentioned in this message.  Informed the pts wife that Dr. [name] suggest that once he feels like he has recovered from his recent knee surgery, we need to bring him in and schedule him for a TEE for further evaluate his mitral valve.  Informed the pts wife that per Dr. [name], if his mitral valve is severe, then we will refer him to Dr. [name] for repair.  Informed the pts wife this will be arranged based on his recovery, so we have him scheduled "tentatively" for 04/20/19 at 1100 with Dr. [name] in clinic, and for 05/12/19 at 1100 with Dr. [name] in clinic.  Based on how he feels like he has recovered from his knee surgery, will determine which clinic date he comes into, so that we can schedule his TEE at this time, update his H&P, obtain labs, schedule covid testing, and provide TEE instructions at this visit.  Wife will keep [place] up-to-date with how his recovery is going, and will go with the appt that meets his need.  Wife states if he is not feeling recovered from his knee surgery by 2/10 appt, they will call and cancel this and keep his 3/4 appt instead with Dr. [name].    Also informed the pts wife that in regards to his Xarelto, he should stop after 3 weeks as instructed by Dr. Wynelle Link, then bridge to ASA 81 mg bid as Dr. Wynelle Link instructed, then thereafter, Dr. [name] said he should be on ASA 81 mg po daily for the rest of his life, as he has evidence of CAD on his calcium score scan.  Informed the pts wife that they should call us closer to his 2/10 appt, to inform us of how he is feeling since his surgery, so that we can bring him in to arrange his TEE accordingly. Informed the pts wife that if severe findings are noted on his TEE,  then Dr. [name] will see him again in the office, to arrange the rest of it.   Wife verbalized understanding and agrees with this plan.  Wife states she will keep [place] posted on his recovery  process, and if he is recovering quicker than they anticipated, she will call us and let us know, so we can move the appt up to a sooner date, or keep the 2/10 vs 3/4 appt.  Wife states pt is having a very difficult time right now dealing with pain and doing PT.

## 2019-03-18 DIAGNOSIS — M25562 Pain in left knee: Secondary | ICD-10-CM | POA: Diagnosis not present

## 2019-03-21 ENCOUNTER — Telehealth: Payer: Self-pay | Admitting: Cardiology

## 2019-03-21 DIAGNOSIS — M25562 Pain in left knee: Secondary | ICD-10-CM | POA: Diagnosis not present

## 2019-03-21 NOTE — Telephone Encounter (Signed)
Pt was calling in to speak with our device clinic, for complaints of feeling dizzy and pre-syncopal for about 5-10 mins at around 12:30 pm today.  Pt felt like this was an "arrhythmia" he was experiencing.  Pt states he did not pass out, but felt like it.  Pt states that the episode was alleviated by laying down for a few mins.  He stated he had no chest pain, sob, doe, diaphoresis, or N/V while episode occurred. Pt does have a pacemaker.  Pt is actually due for a home remote check tomorrow 1/12, at [place].   Pt would like for me to route this message to our device clinic today, to see if they can further assist him with sending in a transmission.  Pt is due to see Dr. Curt Bears this Friday 1/15 in EP. Pt did state that his wife will be taking him to Physical Therapy today between 2:30-3:30pm, but will return home at 4 pm. 3 weeks ago pt had total knee replacement.  Pt states he is asymptomatic at this time, and feels well enough to go to PT.  Informed the pt that I will route this message to our device clinic to further follow-up and assist the pt with his device today. Pt verbalized understanding and agrees with this plan. Pt was more than gracious for all the assistance provided.

## 2019-03-21 NOTE — Telephone Encounter (Signed)
Transmission received.

## 2019-03-21 NOTE — Telephone Encounter (Signed)
Patient calling to speak with Karlene Einstein, Dr. Francesca Oman nurse. States he felt like he was going to pass out earlier but never did. Would not go further, wanted to speak with the nurse.

## 2019-03-21 NOTE — Telephone Encounter (Signed)
The pt is in his car and asked me to call him at 4 pm to help him send a manual transmission.

## 2019-03-21 NOTE — Telephone Encounter (Signed)
Transmission received. Normal PPM function. Presenting rhythm AP/VS @ 82bpm with PVC. No episodes. Lead trends stable. AP 14.2%, VP <0.1%. Routed to Dr. Curt Bears and Venida Jarvis, RN.

## 2019-03-22 ENCOUNTER — Ambulatory Visit (INDEPENDENT_AMBULATORY_CARE_PROVIDER_SITE_OTHER): Payer: Medicare Other | Admitting: *Deleted

## 2019-03-22 DIAGNOSIS — I455 Other specified heart block: Secondary | ICD-10-CM | POA: Diagnosis not present

## 2019-03-22 LAB — CUP PACEART REMOTE DEVICE CHECK
Battery Remaining Longevity: 148 mo
Battery Voltage: 3.03 V
Brady Statistic AP VP Percent: 0.03 %
Brady Statistic AP VS Percent: 13.86 %
Brady Statistic AS VP Percent: 0.03 %
Brady Statistic AS VS Percent: 86.08 %
Brady Statistic RA Percent Paced: 14.17 %
Brady Statistic RV Percent Paced: 0.06 %
Date Time Interrogation Session: 20210111162354
Implantable Lead Implant Date: 20190805
Implantable Lead Implant Date: 20190805
Implantable Lead Location: 753859
Implantable Lead Location: 753860
Implantable Lead Model: 5076
Implantable Lead Model: 5076
Implantable Pulse Generator Implant Date: 20190805
Lead Channel Impedance Value: 304 Ohm
Lead Channel Impedance Value: 323 Ohm
Lead Channel Impedance Value: 342 Ohm
Lead Channel Impedance Value: 380 Ohm
Lead Channel Pacing Threshold Amplitude: 0.5 V
Lead Channel Pacing Threshold Amplitude: 0.75 V
Lead Channel Pacing Threshold Pulse Width: 0.4 ms
Lead Channel Pacing Threshold Pulse Width: 0.4 ms
Lead Channel Sensing Intrinsic Amplitude: 3.25 mV
Lead Channel Sensing Intrinsic Amplitude: 3.25 mV
Lead Channel Sensing Intrinsic Amplitude: 5 mV
Lead Channel Sensing Intrinsic Amplitude: 5 mV
Lead Channel Setting Pacing Amplitude: 2 V
Lead Channel Setting Pacing Amplitude: 2.5 V
Lead Channel Setting Pacing Pulse Width: 0.4 ms
Lead Channel Setting Sensing Sensitivity: 1.2 mV

## 2019-03-23 DIAGNOSIS — M25562 Pain in left knee: Secondary | ICD-10-CM | POA: Diagnosis not present

## 2019-03-24 ENCOUNTER — Ambulatory Visit: Payer: Medicare Other | Attending: Internal Medicine

## 2019-03-24 DIAGNOSIS — Z23 Encounter for immunization: Secondary | ICD-10-CM | POA: Diagnosis not present

## 2019-03-24 NOTE — Progress Notes (Signed)
   Covid-19 Vaccination Clinic  Name:  Alexander [name]    MRN: [id number] DOB: October 16, 1940  03/24/2019  Alexander [name] was observed post Covid-19 immunization for 15 minutes without incidence. He was provided with Vaccine Information Sheet and instruction to access the V-Safe system.   Alexander [name] was instructed to call 911 with any severe reactions post vaccine: Marland Kitchen Difficulty breathing  . Swelling of your face and throat  . A fast heartbeat  . A bad rash all over your body  . Dizziness and weakness    Immunizations Administered    Name Date Dose VIS Date Route   Pfizer COVID-19 Vaccine 03/24/2019  8:31 AM 0.3 mL 02/18/2019 Intramuscular   Manufacturer: [place]   Lot: [id number]   [place]: [id number]

## 2019-03-25 ENCOUNTER — Encounter: Payer: Self-pay | Admitting: Cardiology

## 2019-03-25 ENCOUNTER — Other Ambulatory Visit: Payer: Self-pay

## 2019-03-25 ENCOUNTER — Ambulatory Visit (INDEPENDENT_AMBULATORY_CARE_PROVIDER_SITE_OTHER): Payer: Medicare Other | Admitting: Cardiology

## 2019-03-25 VITALS — BP 150/72 | HR 82 | Ht 69.5 in | Wt 168.0 lb

## 2019-03-25 DIAGNOSIS — I495 Sick sinus syndrome: Secondary | ICD-10-CM | POA: Diagnosis not present

## 2019-03-25 DIAGNOSIS — M25562 Pain in left knee: Secondary | ICD-10-CM | POA: Diagnosis not present

## 2019-03-25 NOTE — Progress Notes (Signed)
Electrophysiology Office Note   Date:  03/25/2019   ID:  Alexander [name], DOB 06-08-1940, MRN [id number]  PCP:  Alexander Infante, MD  Cardiologist:  Alexander [name]  Primary Electrophysiologist:  Alexander Lieber Meredith Leeds, MD    No chief complaint on file.    History of Present Illness: Alexander [name] is a 79 y.o. male who is being seen today for the evaluation of sinus arrest at the request of Alexander Infante, MD. Presenting today for electrophysiology evaluation.  He has a history of hypertension, hyperlipidemia, mitral valve prolapse, and syncope.  He was admitted to the hospital after episodes of syncope and was noted to have sinus arrest.  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  Today, denies symptoms of palpitations, chest pain, shortness of breath, orthopnea, PND, lower extremity edema, claudication, dizziness, presyncope, syncope, bleeding, or neurologic sequela. The patient is tolerating medications without difficulties.  He currently feels well.  He recently had a knee replacement performed.  After his knee replacement, he got quite short of breath and had a work-up which was negative for PE or cardiac causes.  He was found to have moderate mitral regurgitation which was stable.  He has a plan for a TEE in the future to further assess his mitral valve.   Past Medical History:  Diagnosis Date  . Hyperlipidemia   . Hypertension   . MVP (mitral valve prolapse)    mild  . Syncope    Past Surgical History:  Procedure Laterality Date  . HERNIA REPAIR    . PACEMAKER IMPLANT N/A 10/12/2017   Procedure: PACEMAKER IMPLANT;  Surgeon: Constance Haw, MD;  Location: [place] [facility];  Service: Cardiovascular;  Laterality: N/A;  . TONSILECTOMY, ADENOIDECTOMY, BILATERAL MYRINGOTOMY AND TUBES     as child  . TOTAL KNEE ARTHROPLASTY Left   . TOTAL KNEE ARTHROPLASTY Left 02/23/2019   Procedure: TOTAL KNEE ARTHROPLASTY;  Surgeon: Alexander Arabian, MD;  Location: [facility];   Service: Orthopedics;  Laterality: Left;  93min     Current Outpatient Medications  Medication Sig Dispense Refill  . acetaminophen (TYLENOL) 500 MG tablet Take 500-1,000 mg by mouth every 6 (six) hours as needed for mild pain.    Marland Kitchen aspirin EC 81 MG tablet Take 81 mg by mouth daily.    Marland Kitchen docusate sodium (COLACE) 100 MG capsule Take 1 capsule (100 mg total) by mouth 2 (two) times daily. 20 capsule 0  . methocarbamol (ROBAXIN) 500 MG tablet Take 1 tablet (500 mg total) by mouth every 6 (six) hours as needed for muscle spasms. 42 tablet 0  . ondansetron (ZOFRAN) 4 MG tablet Take 1 tablet (4 mg total) by mouth every 6 (six) hours as needed for nausea. 20 tablet 0  . oxyCODONE (OXY IR/ROXICODONE) 5 MG immediate release tablet Take 1-3 tablets (5-15 mg total) by mouth every 6 (six) hours as needed for severe pain. 30 tablet 0  . polyethylene glycol (MIRALAX / GLYCOLAX) 17 g packet Take 17 g by mouth daily as needed for mild constipation. 20 each 0  . zolpidem (AMBIEN) 10 MG tablet TAKE ONE TABLET AT BEDTIME AS NEEDED 90 tablet 0   No current facility-administered medications for this visit.    Allergies:   Nuts   Social History:  The patient  reports that he has never smoked. He has never used smokeless tobacco. He reports current alcohol use of about 5.0 standard drinks of alcohol per week. He reports that he does not use  drugs.   Family History:  The patient's family history includes Alzheimer's disease in his mother; Hypertension in his father.    ROS:  Please see the history of present illness.   Otherwise, review of systems is positive for none.   All other systems are reviewed and negative.   PHYSICAL EXAM: VS:  BP (!) 150/72   Pulse 82   Ht 5' 9.5" (1.765 m)   Wt 168 lb (76.2 kg)   SpO2 98%   BMI 24.45 kg/m  , BMI Body mass index is 24.45 kg/m. GEN: Well nourished, well developed, in no acute distress  HEENT: normal  Neck: no JVD, carotid bruits, or masses Cardiac: RRR; no  murmurs, rubs, or gallops,no edema  Respiratory:  clear to auscultation bilaterally, normal work of breathing GI: soft, nontender, nondistended, + BS MS: no deformity or atrophy  Skin: warm and dry, device site well healed Neuro:  Strength and sensation are intact Psych: euthymic mood, full affect  EKG:  EKG is not ordered today. Personal review of the ekg ordered 03/13/18 shows sinus rhythm, rate 78, right bundle branch block  Personal review of the device interrogation today. Results in [place]: 02/10/2019: NT-Pro BNP 139; TSH 1.310 02/22/2019: ALT 23 03/14/2019: BUN 24; Creatinine, Ser 1.00; Hemoglobin 12.3; Platelets 411; Potassium 4.2; Sodium 138    Lipid Panel     Component Value Date/Time   CHOL 169 02/10/2019 1429   TRIG 80 02/10/2019 1429   HDL 81 02/10/2019 1429   CHOLHDL 2.1 02/10/2019 1429   LDLCALC 73 02/10/2019 1429     Wt Readings from Last 3 Encounters:  03/25/19 168 lb (76.2 kg)  03/14/19 165 lb (74.8 kg)  03/14/19 165 lb (74.8 kg)      Other studies Reviewed: Additional studies/ records that were reviewed today include: TTE 03/14/2019 Review of the above records today demonstrates:   1. Left ventricular ejection fraction, by visual estimation, is 55 to 60%. There is no increased left ventricular wall thickness.  2. Left ventricular diastolic parameters are consistent with Grade I diastolic dysfunction (impaired relaxation).  3. Mild to moderately dilated left ventricular internal cavity size.  4. Global right ventricle has normal systolc function.The right ventricular size is normal. no increase in right ventricular wall thickness.  5. Moderate mitral valve prolapse.  6. Moderate thickening of the mitral valve leaflet(s).  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  8. Normal pulmonary artery systolic pressure.  9. The inferior vena cava is normal in size with greater than 50% respiratory variability, suggesting  right atrial pressure of 3 mmHg. 10. A prior study was performed on 02/10/2019. 11. No significant change from prior study. 12. Prior echo: LVEF 55-60%, moderate MR with MVP.   ASSESSMENT AND PLAN:  1.  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  Device functioning appropriately.  No changes.  2.  Hypertension: mildly elevated today. Usually well controlled, no changes.  3.  Hyperlipidemia: Continue Crestor  4.  Mitral valve regurgitation: Moderate on his most recent transthoracic echo.  He has plans for a transesophageal upcoming.  Current medicines are reviewed at length with the patient today.   The patient does not have concerns regarding his medicines.  The following changes were made today:  none  Labs/ tests ordered today include:  No orders of the defined types were placed in this encounter.    Disposition:   FU with Chino Sardo 12 months  Signed, Lesean Woolverton Meredith Leeds, MD  03/25/2019 3:11 PM     [address] [place] [address] [phone] (office) [phone] (fax)

## 2019-03-25 NOTE — Patient Instructions (Signed)
Medication Instructions:  Your physician recommends that you continue on your current medications as directed. Please refer to the Current Medication list given to you today.  *If you need a refill on your cardiac medications before your next appointment, please call your pharmacy*  Labwork: None ordered If you have labs (blood work) drawn today and your tests are completely normal, you will receive your results only by:  [place] (if you have MyChart) OR  A paper copy in the mail If you have any lab test that is abnormal or we need to change your treatment, we will call you to review the results.  Testing/Procedures: None ordered  Follow-Up: Remote monitoring is used to monitor your Pacemaker or ICD from home. This monitoring reduces the number of office visits required to check your device to one time per year. It allows [place] to keep an eye on the functioning of your device to ensure it is working properly. You are scheduled for a device check from home on 06/21/2019. You may send your transmission at any time that day. If you have a wireless device, the transmission will be sent automatically. After your physician reviews your transmission, you will receive a postcard with your next transmission date.  At [facility], you and your health needs are our priority.  As part of our continuing mission to provide you with exceptional heart care, we have created designated Provider Care Teams.  These Care Teams include your primary Cardiologist (physician) and Advanced Practice Providers (APPs -  Physician Assistants and Nurse Practitioners) who all work together to provide you with the care you need, when you need it.  You will need a follow up appointment in 12 months.  Please call our office 2 months in advance to schedule this appointment.  You may see Dr Curt Bears or one of the following Advanced Practice Providers on your designated Care Team:    Chanetta Marshall, NP  Tommye Standard,  PA-C  Oda Kilts, [place]  Thank you for choosing [facility]!!   Trinidad Curet, RN [phone]  Any Other Special Instructions Will Be Listed Below (If Applicable).

## 2019-03-28 DIAGNOSIS — M25562 Pain in left knee: Secondary | ICD-10-CM | POA: Diagnosis not present

## 2019-03-29 DIAGNOSIS — Z471 Aftercare following joint replacement surgery: Secondary | ICD-10-CM | POA: Diagnosis not present

## 2019-03-29 DIAGNOSIS — Z96652 Presence of left artificial knee joint: Secondary | ICD-10-CM | POA: Diagnosis not present

## 2019-03-30 DIAGNOSIS — M25562 Pain in left knee: Secondary | ICD-10-CM | POA: Diagnosis not present

## 2019-04-01 DIAGNOSIS — M25562 Pain in left knee: Secondary | ICD-10-CM | POA: Diagnosis not present

## 2019-04-05 DIAGNOSIS — M25562 Pain in left knee: Secondary | ICD-10-CM | POA: Diagnosis not present

## 2019-04-06 DIAGNOSIS — M25562 Pain in left knee: Secondary | ICD-10-CM | POA: Diagnosis not present

## 2019-04-08 DIAGNOSIS — M25562 Pain in left knee: Secondary | ICD-10-CM | POA: Diagnosis not present

## 2019-04-11 ENCOUNTER — Ambulatory Visit: Payer: Medicare Other | Attending: Internal Medicine

## 2019-04-11 DIAGNOSIS — M25562 Pain in left knee: Secondary | ICD-10-CM | POA: Diagnosis not present

## 2019-04-11 DIAGNOSIS — Z23 Encounter for immunization: Secondary | ICD-10-CM | POA: Insufficient documentation

## 2019-04-11 NOTE — Progress Notes (Signed)
   Covid-19 Vaccination Clinic  Name:  Alexander [name]    MRN: [id number] DOB: 16-Aug-1940  04/11/2019  Mr. Schwass was observed post Covid-19 immunization for 15 minutes without incidence. He was provided with Vaccine Information Sheet and instruction to access the V-Safe system.   Mr. Goudy was instructed to call 911 with any severe reactions post vaccine: Marland Kitchen Difficulty breathing  . Swelling of your face and throat  . A fast heartbeat  . A bad rash all over your body  . Dizziness and weakness    Immunizations Administered    Name Date Dose VIS Date Route   Pfizer COVID-19 Vaccine 04/11/2019  8:26 AM 0.3 mL 02/18/2019 Intramuscular   Manufacturer: [place]   Lot: [id number]   [place]: [id number]

## 2019-04-13 DIAGNOSIS — M25562 Pain in left knee: Secondary | ICD-10-CM | POA: Diagnosis not present

## 2019-04-15 DIAGNOSIS — M25562 Pain in left knee: Secondary | ICD-10-CM | POA: Diagnosis not present

## 2019-04-18 DIAGNOSIS — M25562 Pain in left knee: Secondary | ICD-10-CM | POA: Diagnosis not present

## 2019-04-19 NOTE — Progress Notes (Signed)
Cardiology Office Note    Date:  04/19/2019   ID:  Alexander [name], DOB 10/10/1940, MRN [id number]  PCP:  [name], MD  Cardiologist:  [name], MD   Reason for visit: 1 month follow up, MVP  History of Present Illness:  Alexander [name] is a 79 y.o. 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 The patient has been followed by Dr. Mare Ferrari for years. He was he used to perform cardio exercise 3 times a week and play golf. He has retired in April 2019.   He presented to the hospital on October 11, 2017 with 3 episodes of syncope, he was diagnosed with sinus arrest and 15-second pause and he was implanted MDT dual chamber PPM on 10/12/17 by Dr Curt Bears.  There were no immediate post procedure complications.  He was afterwards experiencing more presyncopal episodes mostly associated with dehydration and resolved with good hydration.  He continued to exercise daily until start of Covid when he was unable to go to gym.  Over the summer he noticed some fatigue, his wife states that she is concerned about some subtle memory loss.  In Dec 2020 he had an episode of sudden onset shortness of breath that lasted for hour and a half.  It was not associated with exertion, there was no chest pain no palpitation dizziness or syncope.  He denies any exertional dyspnea however he is not doing any significant activity right now.  He underwent left knee replecement by Dr. Maureen Ralphs in Dec 2020, he was seen by Dr Gillian Shields in the clinic for acute SOB on 03/14/2019 and was sent to ER. CT PE study without signs of acute abnormalities. Delta troponins 7.  No symptoms while in the ED.  Discussed with his Cardiologist, Dr. [name] and agree with outpatient evaluation of dyspnea on exertion and discussed reasons to return.   04/20/2019 - 4 week follow up,    Past Medical History:    Diagnosis Date  . Hyperlipidemia   . Hypertension   . MVP (mitral valve prolapse)    mild  . Syncope    Past Surgical History:  Procedure Laterality Date  . HERNIA REPAIR    . PACEMAKER IMPLANT N/A 10/12/2017   Procedure: PACEMAKER IMPLANT;  Surgeon: Constance Haw, MD;  Location: [place] [facility];  Service: Cardiovascular;  Laterality: N/A;  . TONSILECTOMY, ADENOIDECTOMY, BILATERAL MYRINGOTOMY AND TUBES     as child  . TOTAL KNEE ARTHROPLASTY Left   . TOTAL KNEE ARTHROPLASTY Left 02/23/2019   Procedure: TOTAL KNEE ARTHROPLASTY;  Surgeon: Gaynelle Arabian, MD;  Location: [facility];  Service: Orthopedics;  Laterality: Left;  75min   Current Medications: Outpatient Medications Prior to Visit  Medication Sig Dispense Refill  . acetaminophen (TYLENOL) 500 MG tablet Take 500-1,000 mg by mouth every 6 (six) hours as needed for mild pain.    Marland Kitchen aspirin EC 81 MG tablet Take 81 mg by mouth daily.    Marland Kitchen docusate sodium (COLACE) 100 MG capsule Take 1 capsule (100 mg total) by mouth 2 (two) times daily. 20 capsule 0  . methocarbamol (ROBAXIN) 500 MG tablet Take 1 tablet (500 mg total) by mouth every 6 (six) hours as needed for muscle spasms. 42 tablet 0  . ondansetron (ZOFRAN) 4 MG tablet Take 1 tablet (4 mg total) by mouth every 6 (six) hours as needed for nausea. [place]  tablet 0  . oxyCODONE (OXY IR/ROXICODONE) 5 MG immediate release tablet Take 1-3 tablets (5-15 mg total) by mouth every 6 (six) hours as needed for severe pain. 30 tablet 0  . polyethylene glycol (MIRALAX / GLYCOLAX) 17 g packet Take 17 g by mouth daily as needed for mild constipation. 20 each 0  . zolpidem (AMBIEN) 10 MG tablet TAKE ONE TABLET AT BEDTIME AS NEEDED 90 tablet 0   No facility-administered medications prior to visit.    Allergies:   Nuts   Social History   Socioeconomic History  . Marital status: Married    Spouse name: Not on file  . Number of children: Not on file  . Years of education: Not on file  .  Highest education level: Not on file  Occupational History  . Not on file  Tobacco Use  . Smoking status: Never Smoker  . Smokeless tobacco: Never Used  Substance and Sexual Activity  . Alcohol use: Yes    Alcohol/week: 5.0 standard drinks    Types: 5 Glasses of wine per week  . Drug use: No  . Sexual activity: Not on file  Other Topics Concern  . Not on file  Social History Narrative  . Not on file   Social Determinants of Health   Financial Resource Strain:   . Difficulty of Paying Living Expenses: Not on file  Food Insecurity:   . Worried About Charity fundraiser in the Last Year: Not on file  . Ran Out of Food in the Last Year: Not on file  Transportation Needs:   . Lack of Transportation (Medical): Not on file  . Lack of Transportation (Non-Medical): Not on file  Physical Activity:   . Days of Exercise per Week: Not on file  . Minutes of Exercise per Session: Not on file  Stress:   . Feeling of Stress : Not on file  Social Connections:   . Frequency of Communication with Friends and Family: Not on file  . Frequency of Social Gatherings with Friends and Family: Not on file  . Attends Religious Services: Not on file  . Active Member of Clubs or Organizations: Not on file  . Attends Archivist Meetings: Not on file  . Marital Status: Not on file    Family History:  The patient's family history includes Alzheimer's disease in his mother; Hypertension in his father.   ROS:   Please see the history of present illness.    ROS All other systems reviewed and are negative.  PHYSICAL EXAM:   VS:  There were no vitals taken for this visit.   GEN: Well nourished, well developed, in no acute distress  HEENT: normal  Neck: no JVD, carotid bruits, or masses Cardiac: RRR; 5 out of 6 holosystolic murmur, rubs, or gallops,no edema  Respiratory:  clear to auscultation bilaterally, normal work of breathing GI: soft, nontender, nondistended, + BS MS: no deformity or  atrophy  Skin: warm and dry, no rash Neuro:  Alert and Oriented x 3, Strength and sensation are intact Psych: euthymic mood, full affect  Wt Readings from Last 3 Encounters:  03/25/19 168 lb (76.2 kg)  03/14/19 165 lb (74.8 kg)  03/14/19 165 lb (74.8 kg)    Studies/Labs Reviewed:   EKG:  EKG is ordered today.  It shows atrial paced rhythm, right bundle branch block, unchanged from prior.  This was personally reviewed.  Recent Labs: 02/10/2019: NT-Pro BNP 139; TSH 1.310 02/22/2019: ALT 23 03/14/2019: BUN  24; Creatinine, Ser 1.00; Hemoglobin 12.3; Platelets 411; Potassium 4.2; Sodium 138   Lipid Panel    Component Value Date/Time   CHOL 169 02/10/2019 1429   TRIG 80 02/10/2019 1429   HDL 81 02/10/2019 1429   CHOLHDL 2.1 02/10/2019 1429   LDLCALC 73 02/10/2019 1429    Additional studies/ records that were reviewed today include:   TTE: 10/12/2017 - Left ventricle: The cavity size was normal. Wall thickness was   increased in a pattern of mild LVH. Systolic function was normal.   The estimated ejection fraction was in the range of 60% to 65%.   Left ventricular diastolic function parameters were normal. - Mitral valve: Bileaflet MVP with moderate myxomatous changes and   posteriorly directed jet There was mild to moderate   regurgitation.   ASSESSMENT:    No diagnosis found.  PLAN:  In order of problems listed above:  1. Shortness of breath, acute onset, I have reviewed pacemaker interrogation he has no episodes of atrial fibrillation or other tachyarrhythmias.  I feel that this is related to worsening mitral regurgitation.  He is murmur is pretty significant, however repeat echocardiogram performed today in a clinic that I have personally reviewed shows moderate mitral regurgitation.  Mitral leaflets are myxomatous with bileaflet prolapse and the jet hits the posterior wall however on this transthoracic echo the jet is not very prominent.  Right-sided pressures are borderline  at 30 mmHg.  LVEF is 55 to 60%. 2. MVP, mitral regurgitation, we will proceed with TEE to further evaluate his mitral valve afterwards.  If severe we will refer to Dr. [name] for a repair. 3.  Status post pacemaker implantation for sinus arrest, pacemaker interrogated on Monday and functioning well.  EKG today shows atrial paced rhythm with right bundle branch block, unchanged from prior. 4. Hypertension -well-controlled. 5. Hyperlipidemia, given the fact that he is showing signs of memory impairment, we will try to discontinue Crestor and see if he has any improvement of his symptoms, if he does we will refer him for PCSK9 inhibitors. 6. Coronary calcium score of 94. This was 7 percentile for age and sex matched control.   7. Ascending aortic diameter of 39 mm, he is explained that this is still within normal limits, stable on today's echo.  Follow-up in 3 months, obtain labs today.  Medication Adjustments/Labs and Tests Ordered: Current medicines are reviewed at length with the patient today.  Concerns regarding medicines are outlined above.  Medication changes, Labs and Tests ordered today are listed in the Patient Instructions below. There are no Patient Instructions on file for this visit.   Signed, [name], MD  04/19/2019 12:32 PM    [facility] [address] [place], [place], [place]  [address] Phone: [phone]; Fax: [phone]

## 2019-04-20 ENCOUNTER — Encounter: Payer: Self-pay | Admitting: Cardiology

## 2019-04-20 ENCOUNTER — Other Ambulatory Visit: Payer: Self-pay

## 2019-04-20 ENCOUNTER — Ambulatory Visit (INDEPENDENT_AMBULATORY_CARE_PROVIDER_SITE_OTHER): Payer: Medicare Other | Admitting: Cardiology

## 2019-04-20 VITALS — BP 132/76 | HR 68 | Ht 69.5 in | Wt 170.8 lb

## 2019-04-20 DIAGNOSIS — I340 Nonrheumatic mitral (valve) insufficiency: Secondary | ICD-10-CM

## 2019-04-20 DIAGNOSIS — Z950 Presence of cardiac pacemaker: Secondary | ICD-10-CM | POA: Diagnosis not present

## 2019-04-20 DIAGNOSIS — R0602 Shortness of breath: Secondary | ICD-10-CM | POA: Diagnosis not present

## 2019-04-20 DIAGNOSIS — Z01812 Encounter for preprocedural laboratory examination: Secondary | ICD-10-CM

## 2019-04-20 DIAGNOSIS — E785 Hyperlipidemia, unspecified: Secondary | ICD-10-CM

## 2019-04-20 DIAGNOSIS — M25562 Pain in left knee: Secondary | ICD-10-CM | POA: Diagnosis not present

## 2019-04-20 MED ORDER — ASPIRIN EC 81 MG PO TBEC: 81.0000 mg | DELAYED_RELEASE_TABLET | Freq: Every day | ORAL | 3 refills | Status: DC

## 2019-04-20 NOTE — Progress Notes (Signed)
Cardiology Office Note    Date:  04/20/2019   ID:  Alexander [name], DOB January 16, 1941, MRN [id number]  PCP:  [name], MD  Cardiologist:  [name], MD   Reason for visit: 1 month follow up, MVP  History of Present Illness:  Alexander [name] is a 79 y.o. 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 The patient has been followed by Dr. Mare Ferrari for years. He was he used to perform cardio exercise 3 times a week and play golf. He has retired in April 2019.   He presented to the hospital on October 11, 2017 with 3 episodes of syncope, he was diagnosed with sinus arrest and 15-second pause and he was implanted MDT dual chamber PPM on 10/12/17 by Dr Curt Bears.  There were no immediate post procedure complications.  He was afterwards experiencing more presyncopal episodes mostly associated with dehydration and resolved with good hydration.  He continued to exercise daily until start of Covid when he was unable to go to gym.  Over the summer he noticed some fatigue, his wife states that she is concerned about some subtle memory loss.  In Dec 2020 he had an episode of sudden onset shortness of breath that lasted for hour and a half.  It was not associated with exertion, there was no chest pain no palpitation dizziness or syncope.  He denies any exertional dyspnea however he is not doing any significant activity right now.  He underwent left knee replecement by Dr. Maureen Ralphs in Dec 2020, he was seen by Dr Gillian Shields in the clinic for acute SOB on 03/14/2019 and was sent to ER. CT PE study without signs of acute abnormalities. Delta troponins 7.  No symptoms while in the ED.  Discussed with his Cardiologist, Dr. [name] and agree with outpatient evaluation of dyspnea on exertion and discussed reasons to return.   04/20/2019 - 4 week follow up, the patient has recovered  since the episode in the ER he now feels back to normal, he is doing physical therapy after knee replacement 3 times a week, he is not allowed to play golf yet.  He denies any lower extremity edema orthopnea or proximal nocturnal dyspnea.  He denies palpitations but his wife states that he had an episode that was associated with dizziness.  He denies any chest pain.   Past Medical History:  Diagnosis Date  . Hyperlipidemia   . Hypertension   . MVP (mitral valve prolapse)    mild  . Syncope    Past Surgical History:  Procedure Laterality Date  . HERNIA REPAIR    . PACEMAKER IMPLANT N/A 10/12/2017   Procedure: PACEMAKER IMPLANT;  Surgeon: Constance Haw, MD;  Location: [facility];  Service: Cardiovascular;  Laterality: N/A;  . TONSILECTOMY, ADENOIDECTOMY, BILATERAL MYRINGOTOMY AND TUBES     as child  . TOTAL KNEE ARTHROPLASTY Left   . TOTAL KNEE ARTHROPLASTY Left 02/23/2019   Procedure: TOTAL KNEE ARTHROPLASTY;  Surgeon: Gaynelle Arabian, MD;  Location: [facility];  Service: Orthopedics;  Laterality: Left;  43min   Current Medications: Outpatient Medications Prior to Visit  Medication Sig Dispense Refill  . acetaminophen (TYLENOL) 500 MG tablet Take 500-1,000 mg by mouth every 6 (six) hours as needed for mild pain.    Marland Kitchen zolpidem (AMBIEN) 10 MG tablet TAKE ONE TABLET AT BEDTIME AS NEEDED 90 tablet 0  .  aspirin EC 81 MG tablet Take 81 mg by mouth 2 (two) times daily.     Marland Kitchen docusate sodium (COLACE) 100 MG capsule Take 1 capsule (100 mg total) by mouth 2 (two) times daily. (Patient not taking: Reported on 04/20/2019) 20 capsule 0  . methocarbamol (ROBAXIN) 500 MG tablet Take 1 tablet (500 mg total) by mouth every 6 (six) hours as needed for muscle spasms. (Patient not taking: Reported on 04/20/2019) 42 tablet 0  . ondansetron (ZOFRAN) 4 MG tablet Take 1 tablet (4 mg total) by mouth every 6 (six) hours as needed for nausea. (Patient not taking: Reported on 04/20/2019) 20 tablet 0  .  oxyCODONE (OXY IR/ROXICODONE) 5 MG immediate release tablet Take 1-3 tablets (5-15 mg total) by mouth every 6 (six) hours as needed for severe pain. (Patient not taking: Reported on 04/20/2019) 30 tablet 0  . polyethylene glycol (MIRALAX / GLYCOLAX) 17 g packet Take 17 g by mouth daily as needed for mild constipation. (Patient not taking: Reported on 04/20/2019) 20 each 0   No facility-administered medications prior to visit.    Allergies:   Nuts   Social History   Socioeconomic History  . Marital status: Married    Spouse name: Not on file  . Number of children: Not on file  . Years of education: Not on file  . Highest education level: Not on file  Occupational History  . Not on file  Tobacco Use  . Smoking status: Never Smoker  . Smokeless tobacco: Never Used  Substance and Sexual Activity  . Alcohol use: Yes    Alcohol/week: 5.0 standard drinks    Types: 5 Glasses of wine per week  . Drug use: No  . Sexual activity: Not on file  Other Topics Concern  . Not on file  Social History Narrative  . Not on file   Social Determinants of Health   Financial Resource Strain:   . Difficulty of Paying Living Expenses: Not on file  Food Insecurity:   . Worried About Charity fundraiser in the Last Year: Not on file  . Ran Out of Food in the Last Year: Not on file  Transportation Needs:   . Lack of Transportation (Medical): Not on file  . Lack of Transportation (Non-Medical): Not on file  Physical Activity:   . Days of Exercise per Week: Not on file  . Minutes of Exercise per Session: Not on file  Stress:   . Feeling of Stress : Not on file  Social Connections:   . Frequency of Communication with Friends and Family: Not on file  . Frequency of Social Gatherings with Friends and Family: Not on file  . Attends Religious Services: Not on file  . Active Member of Clubs or Organizations: Not on file  . Attends Archivist Meetings: Not on file  . Marital Status: Not on file      Family History:  The patient's family history includes Alzheimer's disease in his mother; Hypertension in his father.   ROS:   Please see the history of present illness.    ROS All other systems reviewed and are negative.  PHYSICAL EXAM:   VS:  BP 132/76   Pulse 68   Ht 5' 9.5" (1.765 m)   Wt 170 lb 12.8 oz (77.5 kg)   SpO2 97%   BMI 24.86 kg/m    GEN: Well nourished, well developed, in no acute distress  HEENT: normal  Neck: no JVD, carotid bruits, or  masses Cardiac: RRR; 5 out of 6 holosystolic murmur, rubs, or gallops,no edema  Respiratory:  clear to auscultation bilaterally, normal work of breathing GI: soft, nontender, nondistended, + BS MS: no deformity or atrophy  Skin: warm and dry, no rash Neuro:  Alert and Oriented x 3, Strength and sensation are intact Psych: euthymic mood, full affect  Wt Readings from Last 3 Encounters:  04/20/19 170 lb 12.8 oz (77.5 kg)  03/25/19 168 lb (76.2 kg)  03/14/19 165 lb (74.8 kg)    Studies/Labs Reviewed:   EKG:  EKG is ordered today.  It shows atrial paced rhythm, right bundle branch block, unchanged from prior.  This was personally reviewed.  Recent Labs: 02/10/2019: NT-Pro BNP 139; TSH 1.310 02/22/2019: ALT 23 03/14/2019: BUN 24; Creatinine, Ser 1.00; Hemoglobin 12.3; Platelets 411; Potassium 4.2; Sodium 138   Lipid Panel    Component Value Date/Time   CHOL 169 02/10/2019 1429   TRIG 80 02/10/2019 1429   HDL 81 02/10/2019 1429   CHOLHDL 2.1 02/10/2019 1429   LDLCALC 73 02/10/2019 1429    Additional studies/ records that were reviewed today include:   TTE: 10/12/2017 - Left ventricle: The cavity size was normal. Wall thickness was   increased in a pattern of mild LVH. Systolic function was normal.   The estimated ejection fraction was in the range of 60% to 65%.   Left ventricular diastolic function parameters were normal. - Mitral valve: Bileaflet MVP with moderate myxomatous changes and   posteriorly directed jet  There was mild to moderate   regurgitation.   ASSESSMENT:    1. Moderate mitral regurgitation   2. Pre-procedure lab exam   3. Cardiac pacemaker in situ   4. Hyperlipidemia, unspecified hyperlipidemia type   5. SOB (shortness of breath)     PLAN:  In order of problems listed above:  1. Bilateral severe MVP with myxomatous leaflets, mitral regurgitation appears severe, he now has episodes of acute SOB, we will proceed with TEE to further evaluate his mitral valve, if refer to Dr [name] for repair.  2.  Status post pacemaker implantation for sinus arrest, pacemaker interrogated in January and functioning well.  EKG today shows atrial paced rhythm with right bundle branch block, unchanged from prior. 3. Hypertension -well-controlled. 4. Hyperlipidemia, given the fact that he is showing signs of memory impairment, we will try to discontinue Crestor and see if he has any improvement of his symptoms, if he does we will refer him for PCSK9 inhibitors. 5. Coronary calcium score of 94. This was 26 percentile for age and sex matched control.   6. Ascending aortic diameter of 39 mm, he is explained that this is still within normal limits, stable on recent echo.  Follow-up in 3 months, TEE on 05/23/2019.  Medication Adjustments/Labs and Tests Ordered: Current medicines are reviewed at length with the patient today.  Concerns regarding medicines are outlined above.  Medication changes, Labs and Tests ordered today are listed in the Patient Instructions below. Patient Instructions  Medication Instructions:   Your physician recommends that you continue on your current medications as directed. Please refer to the Current Medication list given to you today.  *If you need a refill on your cardiac medications before your next appointment, please call your pharmacy*   Your physician recommends that you return for lab work in: ON 05/19/19 HERE AT THE OFFICE TO CHECK A BMET AND CBC--PLEASE COME BY THE  OFFICE TO HAVE THIS DONE BEFORE YOUR COVID TEST THAT  IS SCHEDULED FOR THAT AFTERNOON AT 3:10 PM   Testing/Procedures:  Dear Marinell Blight (patient name) You are scheduled for a TEE on May 23, 2019 AT 9 AM with Dr. [name].  Please arrive at the [facility] (Main Entrance A) at [facility]: [address] [place], [place] [address] at 8:00 AM. (1 hour prior to procedure unless lab work is needed; if lab work is needed arrive 1.5 hours ahead)  DIET: Nothing to eat or drink after midnight except a sip of water with medications (see medication instructions below)   Continue your anticoagulant: ASPIRIN 81 MG BY MOUTH DAILY You will need to continue your anticoagulant after your procedure until you are told by your Provider that it is safe to stop   Labs:  Come to: May 19, 2019 HERE AT THE OFFICE RIGHT BEFORE YOU GO TO [place] TEST DONE THAT DAY AT 3:10 PM   You must have a responsible person to drive you home and stay in the waiting area during your procedure. Failure to do so could result in cancellation.  Bring your insurance cards.  *Special Note: Every effort is made to have your procedure done on time. Occasionally there are emergencies that occur at the hospital that may cause delays. Please be patient if a delay does occur.   --YOUR COVID TEST IS SCHEDULED FOR May 19, 2019 AT 3:10 PM AT [facility].  PLEASE BE THERE RIGHT AT 3:10 PM.  YOU WILL NEED TO COME TO THE OFFICE PRIOR TO THAT APPOINTMENT TO HAVE YOUR PRE-PROCEDURE LABS DONE.     Follow-Up:  3 MONTHS WITH DR. [name] ON Jul 20, 2019 AT 9:40 AM     Signed, [name], MD  04/20/2019 11:40 PM    [facility] [place], [place], [place]  [address] Phone: [phone]; Fax: [phone]

## 2019-04-20 NOTE — Patient Instructions (Signed)
Medication Instructions:   Your physician recommends that you continue on your current medications as directed. Please refer to the Current Medication list given to you today.  *If you need a refill on your cardiac medications before your next appointment, please call your pharmacy*   Your physician recommends that you return for lab work in: ON 05/19/19 HERE AT THE OFFICE TO CHECK A BMET AND CBC--PLEASE COME BY THE OFFICE TO HAVE THIS DONE BEFORE YOUR COVID TEST THAT IS SCHEDULED FOR THAT AFTERNOON AT 3:10 PM   Testing/Procedures:  Dear Alexander [name] (patient name) You are scheduled for a TEE on May 23, 2019 AT 9 AM with Dr. [name].  Please arrive at the [facility] (Main Entrance A) at [facility]: [address] [place], [place] [address] at 8:00 AM. (1 hour prior to procedure unless lab work is needed; if lab work is needed arrive 1.5 hours ahead)  DIET: Nothing to eat or drink after midnight except a sip of water with medications (see medication instructions below)   Continue your anticoagulant: ASPIRIN 81 MG BY MOUTH DAILY You will need to continue your anticoagulant after your procedure until you are told by your Provider that it is safe to stop   Labs:  Come to: May 19, 2019 HERE AT THE OFFICE RIGHT BEFORE YOU GO TO [place] TEST DONE THAT DAY AT 3:10 PM   You must have a responsible person to drive you home and stay in the waiting area during your procedure. Failure to do so could result in cancellation.  Bring your insurance cards.  *Special Note: Every effort is made to have your procedure done on time. Occasionally there are emergencies that occur at the hospital that may cause delays. Please be patient if a delay does occur.   --YOUR COVID TEST IS SCHEDULED FOR May 19, 2019 AT 3:10 PM AT [facility].  PLEASE BE THERE RIGHT AT 3:10 PM.  YOU WILL NEED TO COME TO THE OFFICE  PRIOR TO THAT APPOINTMENT TO HAVE YOUR PRE-PROCEDURE LABS DONE.     Follow-Up:  3 MONTHS WITH DR. [name] ON Jul 20, 2019 AT 9:40 AM

## 2019-04-22 DIAGNOSIS — M25562 Pain in left knee: Secondary | ICD-10-CM | POA: Diagnosis not present

## 2019-04-27 DIAGNOSIS — M25562 Pain in left knee: Secondary | ICD-10-CM | POA: Diagnosis not present

## 2019-04-29 DIAGNOSIS — M25562 Pain in left knee: Secondary | ICD-10-CM | POA: Diagnosis not present

## 2019-05-02 DIAGNOSIS — M25562 Pain in left knee: Secondary | ICD-10-CM | POA: Diagnosis not present

## 2019-05-04 DIAGNOSIS — M25562 Pain in left knee: Secondary | ICD-10-CM | POA: Diagnosis not present

## 2019-05-11 ENCOUNTER — Other Ambulatory Visit (HOSPITAL_COMMUNITY): Payer: Medicare Other

## 2019-05-11 ENCOUNTER — Other Ambulatory Visit: Payer: Medicare Other

## 2019-05-11 DIAGNOSIS — M25562 Pain in left knee: Secondary | ICD-10-CM | POA: Diagnosis not present

## 2019-05-12 ENCOUNTER — Ambulatory Visit: Payer: Medicare Other | Admitting: Cardiology

## 2019-05-19 ENCOUNTER — Other Ambulatory Visit: Payer: Medicare Other

## 2019-05-19 ENCOUNTER — Other Ambulatory Visit: Payer: Self-pay

## 2019-05-19 ENCOUNTER — Other Ambulatory Visit (HOSPITAL_COMMUNITY)
Admission: RE | Admit: 2019-05-19 | Discharge: 2019-05-19 | Disposition: A | Discharge status: Home / Self Care | Payer: Medicare Other | Source: Ambulatory Visit | Attending: Cardiology | Admitting: Cardiology

## 2019-05-19 DIAGNOSIS — I340 Nonrheumatic mitral (valve) insufficiency: Secondary | ICD-10-CM

## 2019-05-19 DIAGNOSIS — Z20822 Contact with and (suspected) exposure to covid-19: Secondary | ICD-10-CM | POA: Diagnosis not present

## 2019-05-19 DIAGNOSIS — Z01812 Encounter for preprocedural laboratory examination: Secondary | ICD-10-CM | POA: Diagnosis not present

## 2019-05-19 LAB — SARS CORONAVIRUS 2 (TAT 6-24 HRS): SARS Coronavirus 2: NEGATIVE

## 2019-05-20 ENCOUNTER — Encounter: Payer: Self-pay | Admitting: *Deleted

## 2019-05-20 LAB — BASIC METABOLIC PANEL
BUN/Creatinine Ratio: 21 (ref 10–24)
BUN: 20 mg/dL (ref 8–27)
CO2: 23 mmol/L (ref 20–29)
Calcium: 9.4 mg/dL (ref 8.6–10.2)
Chloride: 104 mmol/L (ref 96–106)
Creatinine, Ser: 0.97 mg/dL (ref 0.76–1.27)
GFR calc Af Amer: 85 mL/min/{1.73_m2} (ref >59)
GFR calc non Af Amer: 73 mL/min/{1.73_m2} (ref >59)
Glucose: 87 mg/dL (ref 65–99)
Potassium: 4.7 mmol/L (ref 3.5–5.2)
Sodium: 141 mmol/L (ref 134–144)

## 2019-05-20 LAB — CBC
Hematocrit: 41.8 % (ref 37.5–51.0)
Hemoglobin: 14 g/dL (ref 13.0–17.7)
MCH: 31.6 pg (ref 26.6–33.0)
MCHC: 33.5 g/dL (ref 31.5–35.7)
MCV: 94 fL (ref 79–97)
Platelets: 219 10*3/uL (ref 150–450)
RBC: 4.43 x10E6/uL (ref 4.14–5.80)
RDW: 12.4 % (ref 11.6–15.4)
WBC: 8.8 10*3/uL (ref 3.4–10.8)

## 2019-05-23 ENCOUNTER — Encounter (HOSPITAL_COMMUNITY): Payer: Self-pay | Admitting: Cardiology

## 2019-05-23 ENCOUNTER — Ambulatory Visit (HOSPITAL_BASED_OUTPATIENT_CLINIC_OR_DEPARTMENT_OTHER): Payer: Medicare Other

## 2019-05-23 ENCOUNTER — Ambulatory Visit (HOSPITAL_COMMUNITY)
Admission: RE | Admit: 2019-05-23 | Discharge: 2019-05-23 | Disposition: A | Discharge status: Home / Self Care | Payer: Medicare Other | Attending: Cardiology | Admitting: Cardiology

## 2019-05-23 ENCOUNTER — Ambulatory Visit (HOSPITAL_COMMUNITY): Payer: Medicare Other | Admitting: Certified Registered Nurse Anesthetist

## 2019-05-23 ENCOUNTER — Encounter (HOSPITAL_COMMUNITY)
Admission: RE | Disposition: A | Discharge status: Home / Self Care | Payer: Self-pay | Source: Home / Self Care | Attending: Cardiology

## 2019-05-23 ENCOUNTER — Other Ambulatory Visit: Payer: Self-pay

## 2019-05-23 DIAGNOSIS — Z7982 Long term (current) use of aspirin: Secondary | ICD-10-CM | POA: Insufficient documentation

## 2019-05-23 DIAGNOSIS — I4510 Unspecified right bundle-branch block: Secondary | ICD-10-CM | POA: Insufficient documentation

## 2019-05-23 DIAGNOSIS — I341 Nonrheumatic mitral (valve) prolapse: Secondary | ICD-10-CM | POA: Diagnosis not present

## 2019-05-23 DIAGNOSIS — I340 Nonrheumatic mitral (valve) insufficiency: Secondary | ICD-10-CM | POA: Insufficient documentation

## 2019-05-23 DIAGNOSIS — E785 Hyperlipidemia, unspecified: Secondary | ICD-10-CM | POA: Diagnosis not present

## 2019-05-23 DIAGNOSIS — I10 Essential (primary) hypertension: Secondary | ICD-10-CM | POA: Diagnosis not present

## 2019-05-23 HISTORY — PX: BUBBLE STUDY: SHX6837

## 2019-05-23 HISTORY — PX: TEE WITHOUT CARDIOVERSION: SHX5443

## 2019-05-23 SURGERY — ECHOCARDIOGRAM, TRANSESOPHAGEAL
Anesthesia: Monitor Anesthesia Care

## 2019-05-23 MED ORDER — PROPOFOL 10 MG/ML IV BOLUS
INTRAVENOUS | Status: DC | PRN
  Administered 2019-05-23 (×3): 20 mg via INTRAVENOUS

## 2019-05-23 MED ORDER — PROPOFOL 500 MG/50ML IV EMUL
INTRAVENOUS | Status: DC | PRN
  Administered 2019-05-23: 100 ug/kg/min via INTRAVENOUS

## 2019-05-23 MED ORDER — SODIUM CHLORIDE 0.9 % IV SOLN: INTRAVENOUS | Status: DC

## 2019-05-23 NOTE — Anesthesia Postprocedure Evaluation (Signed)
Anesthesia Post Note  Patient: DELORES THELEN  Procedure(s) Performed: TRANSESOPHAGEAL ECHOCARDIOGRAM (TEE) (N/A ) BUBBLE STUDY     Patient location during evaluation: Endoscopy Anesthesia Type: MAC Level of consciousness: awake and alert Pain management: pain level controlled Vital Signs Assessment: post-procedure vital signs reviewed and stable Respiratory status: spontaneous breathing, nonlabored ventilation and respiratory function stable Cardiovascular status: stable and blood pressure returned to baseline Postop Assessment: no apparent nausea or vomiting Anesthetic complications: no    Last Vitals:  Vitals:   05/23/19 0940 05/23/19 0950  BP: 130/71 126/69  Pulse: (!) 57   Resp: 19   Temp:    SpO2: 99%     Last Pain:  Vitals:   05/23/19 0950  TempSrc:   PainSc: 0-No pain                 Rickell Wiehe,W. EDMOND

## 2019-05-23 NOTE — CV Procedure (Signed)
     Transesophageal Echocardiogram Note  Alexander [name] [id number] 03-Feb-1941  Procedure: Transesophageal Echocardiogram Indications: mitral regurgitation  Procedure Details Consent: Obtained Time Out: Verified patient identification, verified procedure, site/side was marked, verified correct patient position, special equipment/implants available, Radiology Safety Procedures followed,  medications/allergies/relevent history reviewed, required imaging and test results available.  Performed  Medications: Propofol: 200 mg iv  Left Ventrical:  LVEF 60-65%  Mitral Valve: myxomatous, thickened and A2,2, P2 prolapse and severe mitral regurgitation  Aortic Valve: normal, no AI  Tricuspid Valve: mild TR  Pulmonic Valve: trivial PR  Left Atrium/ Left atrial appendage: no thrombus  Atrial septum: no PFO, negative bubble study  Aorta: normal size, mild AS plaque  Right atrium: pacer wire, prominent Eustachian valve   Complications: No apparent complications Patient did tolerate procedure well.  Alexander Dawley, MD, [facility] 05/23/2019, 9:36 AM

## 2019-05-23 NOTE — H&P (Signed)
Alexander [name] is an 79 y.o. male.   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 The patient has been followed by Dr. Mare Ferrari for years. He was he used to perform cardio exercise 3 times a week and play golf. He has retired in April 2019.   He presented to the hospital on October 11, 2017 with 3 episodes of syncope, he was diagnosed with sinus arrest and 15-second pause and he was implanted MDT dualchamber PPM on 8/5/19by Dr Curt Bears. There were no immediate post procedure complications.  He was afterwards experiencing more presyncopal episodes mostly associated with dehydration and resolved with good hydration.  He continued to exercise daily until start of Covid when he was unable to go to gym.  Over the summer he noticed some fatigue, his wife states that she is concerned about some subtle memory loss.  In Dec 2020 he had an episode of sudden onset shortness of breath that lasted for hour and a half.  It was not associated with exertion, there was no chest pain no palpitation dizziness or syncope.  He denies any exertional dyspnea however he is not doing any significant activity right now.  He underwent left knee replecement by Dr. Maureen Ralphs in Dec 2020, he was seen by Dr Gillian Shields in the clinic for acute SOB on 03/14/2019 and was sent to ER. CT PE study without signs of acute abnormalities. Delta troponins 7. No symptoms while in the ED. Discussed with his Cardiologist, Dr. [name] and agree with outpatient evaluation of dyspnea on exertion and discussed reasons to return.    Past Medical History:  Diagnosis Date  . Hyperlipidemia   . Hypertension   . MVP (mitral valve prolapse)    mild  . Syncope     Past Surgical History:  Procedure Laterality Date  . HERNIA REPAIR    . PACEMAKER IMPLANT N/A  10/12/2017   Procedure: PACEMAKER IMPLANT;  Surgeon: Constance Haw, MD;  Location: [facility];  Service: Cardiovascular;  Laterality: N/A;  . TONSILECTOMY, ADENOIDECTOMY, BILATERAL MYRINGOTOMY AND TUBES     as child  . TOTAL KNEE ARTHROPLASTY Left   . TOTAL KNEE ARTHROPLASTY Left 02/23/2019   Procedure: TOTAL KNEE ARTHROPLASTY;  Surgeon: Gaynelle Arabian, MD;  Location: [facility];  Service: Orthopedics;  Laterality: Left;  58min    Family History  Problem Relation Age of Onset  . Alzheimer's disease Mother   . Hypertension Father    Social History:  reports that he has never smoked. He has never used smokeless tobacco. He reports current alcohol use of about 5.0 standard drinks of alcohol per week. He reports that he does not use drugs.  Allergies:  Allergies  Allergen Reactions  . Nuts Hives, Itching and Swelling    Possible reaction to hazelnuts (tongue swelling)    Medications Prior to Admission  Medication Sig Dispense Refill  . acetaminophen (TYLENOL) 500 MG tablet Take 500-1,000 mg by mouth every 6 (six) hours as needed for mild pain.    . Ascorbic Acid (VITAMIN C) 1000 MG tablet Take 1,000 mg by mouth daily.    Marland Kitchen aspirin EC 81 MG tablet Take 1 tablet (81 mg total) by mouth daily. 90 tablet 3  . Cholecalciferol (VITAMIN D) 50 MCG (2000 UT) tablet Take 2,000 Units by mouth daily.    Marland Kitchen  Multiple Vitamin (MULTIVITAMIN WITH MINERALS) TABS tablet Take 1 tablet by mouth daily.    Marland Kitchen zolpidem (AMBIEN) 10 MG tablet TAKE ONE TABLET AT BEDTIME AS NEEDED (Patient taking differently: Take 10 mg by mouth at bedtime as needed for sleep. ) 90 tablet 0    No results found for this or any previous visit (from the past 48 hour(s)). No results found.  Review of Systems  There were no vitals taken for this visit. Physical Exam   Assessment/Plan   79 year old male with moderate to severe mitral regurgitation on TTE coming for further evaluation.  [name], MD 05/23/2019,  8:28 AM

## 2019-05-23 NOTE — Anesthesia Preprocedure Evaluation (Signed)
Anesthesia Evaluation  Patient identified by MRN, date of birth, ID band Patient awake    Reviewed: Allergy & Precautions, H&P , NPO status , Patient's Chart, lab work & pertinent test results  Airway Mallampati: II  TM Distance: >3 FB Neck ROM: Full    Dental no notable dental hx. (+) Teeth Intact, Dental Advisory Given   Pulmonary neg pulmonary ROS,    Pulmonary exam normal breath sounds clear to auscultation       Cardiovascular hypertension, + dysrhythmias Atrial Fibrillation + pacemaker  Rhythm:Regular Rate:Normal     Neuro/Psych negative neurological ROS  negative psych ROS   GI/Hepatic negative GI ROS, Neg liver ROS,   Endo/Other  negative endocrine ROS  Renal/GU negative Renal ROS  negative genitourinary   Musculoskeletal  (+) Arthritis , Osteoarthritis,    Abdominal   Peds  Hematology negative hematology ROS (+)   Anesthesia Other Findings   Reproductive/Obstetrics negative OB ROS                             Anesthesia Physical Anesthesia Plan  ASA: II  Anesthesia Plan: MAC   Post-op Pain Management:    Induction: Intravenous  PONV Risk Score and Plan: 1 and Propofol infusion  Airway Management Planned: Nasal Cannula  Additional Equipment:   Intra-op Plan:   Post-operative Plan:   Informed Consent: I have reviewed the patients History and Physical, chart, labs and discussed the procedure including the risks, benefits and alternatives for the proposed anesthesia with the patient or authorized representative who has indicated his/her understanding and acceptance.     Dental advisory given  Plan Discussed with: CRNA  Anesthesia Plan Comments:         Anesthesia Quick Evaluation

## 2019-05-23 NOTE — Progress Notes (Signed)
Echocardiogram Echocardiogram Transesophageal has been performed.  Oneal Deputy Abir Craine 05/23/2019, 9:35 AM

## 2019-05-23 NOTE — Transfer of Care (Signed)
Immediate Anesthesia Transfer of Care Note  Patient: Alexander [name]  Procedure(s) Performed: TRANSESOPHAGEAL ECHOCARDIOGRAM (TEE) (N/A ) BUBBLE STUDY  Patient Location: Endoscopy Unit  Anesthesia Type:MAC  Level of Consciousness: drowsy and patient cooperative  Airway & Oxygen Therapy: Patient Spontanous Breathing  Post-op Assessment: Report given to RN, Post -op Vital signs reviewed and stable and Patient moving all extremities X 4  Post vital signs: Reviewed and stable  Last Vitals:  Vitals Value Taken Time  BP 140/69 05/23/19 0930  Temp 36.1 C 05/23/19 0930  Pulse 61 05/23/19 0931  Resp 13 05/23/19 0931  SpO2 97 % 05/23/19 0931  Vitals shown include unvalidated device data.  Last Pain:  Vitals:   05/23/19 0930  TempSrc: Temporal  PainSc: 0-No pain         Complications: No apparent anesthesia complications

## 2019-05-23 NOTE — Discharge Instructions (Signed)

## 2019-05-24 ENCOUNTER — Encounter: Payer: Self-pay | Admitting: Thoracic Surgery (Cardiothoracic Vascular Surgery)

## 2019-05-24 DIAGNOSIS — H9113 Presbycusis, bilateral: Secondary | ICD-10-CM | POA: Diagnosis not present

## 2019-05-24 DIAGNOSIS — H6121 Impacted cerumen, right ear: Secondary | ICD-10-CM | POA: Diagnosis not present

## 2019-05-24 DIAGNOSIS — Z974 Presence of external hearing-aid: Secondary | ICD-10-CM | POA: Diagnosis not present

## 2019-05-26 ENCOUNTER — Institutional Professional Consult (permissible substitution) (INDEPENDENT_AMBULATORY_CARE_PROVIDER_SITE_OTHER): Payer: Medicare Other | Admitting: Thoracic Surgery (Cardiothoracic Vascular Surgery)

## 2019-05-26 ENCOUNTER — Other Ambulatory Visit: Payer: Self-pay

## 2019-05-26 ENCOUNTER — Encounter: Payer: Self-pay | Admitting: Thoracic Surgery (Cardiothoracic Vascular Surgery)

## 2019-05-26 ENCOUNTER — Telehealth: Payer: Self-pay

## 2019-05-26 ENCOUNTER — Other Ambulatory Visit: Payer: Self-pay | Admitting: Thoracic Surgery (Cardiothoracic Vascular Surgery)

## 2019-05-26 VITALS — BP 133/85 | HR 69 | Temp 97.3°F | Resp 16 | Ht 69.5 in | Wt 170.0 lb

## 2019-05-26 DIAGNOSIS — I341 Nonrheumatic mitral (valve) prolapse: Secondary | ICD-10-CM

## 2019-05-26 DIAGNOSIS — I340 Nonrheumatic mitral (valve) insufficiency: Secondary | ICD-10-CM | POA: Diagnosis not present

## 2019-05-26 NOTE — H&P (View-Only) (Signed)
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  Patient states that he has known of a presence of a heart murmur and mitral valve prolapse for many years.  For a long time he was followed by Dr. Mare Ferrari, and more recently he has been followed by Dr. [name].  He has remained physically very active all of his adult life and exercises on a regular basis.  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  He underwent permanent pacemaker placement and has done well ever since.  Over the last several months the patient has developed new onset of exertional shortness of breath.  Symptoms only occur with more strenuous exertion, although the patient had 1 or 2 episodes of severe shortness of breath following strenuous exertion that was slow to recover.  Despite this he has still remained active and he continues to exercise on a regular basis.  He was seen in follow-up recently by Dr. [name] and noted to have a more prominent systolic murmur on physical exam.  Follow-up transthoracic echocardiogram revealed normal left ventricular systolic function with at least moderate mitral regurgitation.  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  There were 3 distinct jets of regurgitation and flow reversal noted in the right-sided pulmonary veins.  There remain normal left ventricular systolic function.  There was mild left atrial enlargement.  No other significant abnormalities were noted.  Cardiothoracic surgical consultation was requested.  Patient is married and lives locally in [place] with his wife.  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  He remains quite active physically.  He enjoys golf and exercises on a regular basis.  He reports several episodes of exertional shortness of breath that have developed only following more strenuous physical exertion.  He denies shortness of breath with ordinary level activity or at rest.  He has not had PND, orthopnea, nor lower extremity edema.  He has not had tachypalpitations, dizzy spells, nor syncope.  He does admit to the development of mild short-term memory loss over the last year or so.  This has been described as annoying but not problematic from a practical standpoint and the patient remains entirely functionally independent.  Past Medical History:  Diagnosis Date   Hyperlipidemia    Hypertension    Mitral regurgitation    MVP (mitral valve prolapse)    Syncope     Past Surgical History:  Procedure Laterality Date   BUBBLE STUDY  05/23/2019   Procedure: BUBBLE STUDY;  Surgeon: [name], MD;  Location: [facility] ENDOSCOPY;  Service: Cardiovascular;;   HERNIA REPAIR     PACEMAKER IMPLANT N/A  10/12/2017   Procedure: PACEMAKER IMPLANT;  Surgeon: Constance Haw, MD;  Location: [facility];  Service: Cardiovascular;  Laterality: N/A;   TEE WITHOUT CARDIOVERSION N/A 05/23/2019   Procedure: TRANSESOPHAGEAL ECHOCARDIOGRAM (TEE);  Surgeon: [name], MD;  Location: [facility];  Service: Cardiovascular;  Laterality: N/A;   TONSILECTOMY,  ADENOIDECTOMY, BILATERAL MYRINGOTOMY AND TUBES     as child   TOTAL KNEE ARTHROPLASTY Left    TOTAL KNEE ARTHROPLASTY Left 02/23/2019   Procedure: TOTAL KNEE ARTHROPLASTY;  Surgeon: Gaynelle Arabian, MD;  Location: [facility] ORS;  Service: Orthopedics;  Laterality: Left;  10min    Family History  Problem Relation Age of Onset   Alzheimer's disease Mother    Hypertension Father     Social History   Socioeconomic History   Marital status: Married    Spouse name: Not on file   Number of children: Not on file   Years of education: Not on file   Highest education level: Not on file  Occupational History   Not on file  Tobacco Use   Smoking status: Never Smoker   Smokeless tobacco: Never Used  Substance and Sexual Activity   Alcohol use: Yes    Alcohol/week: 5.0 standard drinks    Types: 5 Glasses of wine per week   Drug use: No   Sexual activity: Not on file  Other Topics Concern   Not on file  Social History Narrative   Not on file   Social Determinants of Health   Financial Resource Strain:    Difficulty of Paying Living Expenses:   Food Insecurity:    Worried About Charity fundraiser in the Last Year:    Arboriculturist in the Last Year:   Transportation Needs:    Film/video editor (Medical):    Lack of Transportation (Non-Medical):   Physical Activity:    Days of Exercise per Week:    Minutes of Exercise per Session:   Stress:    Feeling of Stress :   Social Connections:    Frequency of Communication with Friends and Family:    Frequency of Social Gatherings with Friends and Family:    Attends Religious Services:    Active Member of Clubs or Organizations:    Attends Music therapist:    Marital Status:   Intimate Partner Violence:    Fear of Current or Ex-Partner:    Emotionally Abused:    Physically Abused:    Sexually Abused:     Current Outpatient Medications  Medication Sig Dispense Refill    acetaminophen (TYLENOL) 500 MG tablet Take 500-1,000 mg by mouth every 6 (six) hours as needed for mild pain.     Ascorbic Acid (VITAMIN C) 1000 MG tablet Take 1,000 mg by mouth daily.     aspirin EC 81 MG tablet Take 1 tablet (81 mg total) by mouth daily. 90 tablet 3   Cholecalciferol (VITAMIN D) 50 MCG (2000 UT) tablet Take 2,000 Units by mouth daily.     Multiple Vitamin (MULTIVITAMIN WITH MINERALS) TABS tablet Take 1 tablet by mouth daily.     zolpidem (AMBIEN) 10 MG tablet TAKE ONE TABLET AT BEDTIME AS NEEDED (Patient taking differently: Take 10 mg by mouth at bedtime as needed for sleep. ) 90 tablet 0   No current facility-administered medications for this visit.    Allergies  Allergen Reactions   Nuts Hives, Itching and Swelling    Possible reaction to hazelnuts (tongue swelling)  Review of Systems:   General:  normal appetite, normal energy, no weight gain, no weight loss, no fever  Cardiac:  no chest pain with exertion, no chest pain at rest, +SOB with more strenuous exertion, no resting SOB, no PND, no orthopnea, no palpitations, no arrhythmia, no atrial fibrillation, no LE edema, no dizzy spells, no syncope  Respiratory:  no shortness of breath, no home oxygen, no productive cough, no dry cough, no bronchitis, no wheezing, no hemoptysis, no asthma, no pain with inspiration or cough, no sleep apnea, no CPAP at night  GI:   no difficulty swallowing, no reflux, no frequent heartburn, no hiatal hernia, no abdominal pain, no constipation, no diarrhea, no hematochezia, no hematemesis, no melena  GU:   no dysuria,  no frequency, no urinary tract infection, no hematuria, no enlarged prostate, no kidney stones, no kidney disease  Vascular:  no pain suggestive of claudication, no pain in feet, no leg cramps, no varicose veins, no DVT, no non-healing foot ulcer  Neuro:   no stroke, no TIA's, no seizures, no headaches, no temporary blindness one eye,  no slurred speech, no  peripheral neuropathy, no chronic pain, no instability of gait, + memory/cognitive dysfunction  Musculoskeletal: no arthritis, no joint swelling, no myalgias, no difficulty walking, normal mobility   Skin:   no rash, no itching, no skin infections, no pressure sores or ulcerations  Psych:   no anxiety, no depression, no nervousness, no unusual recent stress  Eyes:   no blurry vision, no floaters, no recent vision changes, + wears glasses only for reading  ENT:   no hearing loss, no loose or painful teeth, no dentures, last saw dentist yesterday for tooth extraction  Hematologic:  no easy bruising, no abnormal bleeding, no clotting disorder, no frequent epistaxis  Endocrine:  no diabetes, does not check CBG's at home     Physical Exam:   BP 133/85 (BP Location: Left Arm, Patient Position: Sitting, Cuff Size: Normal)    Pulse 69    Temp (!) 97.3 F (36.3 C)    Resp 16    Ht 5' 9.5" (1.765 m)    Wt 170 lb (77.1 kg)    SpO2 98% Comment: RA   BMI 24.74 kg/m   General:  Thin,  well-appearing  HEENT:  Unremarkable   Neck:   no JVD, no bruits, no adenopathy   Chest:   clear to auscultation, symmetrical breath sounds, no wheezes, no rhonchi   CV:   RRR, grade IV/VI holosystolic murmur   Abdomen:  soft, non-tender, no masses   Extremities:  warm, well-perfused, pulses palpable, no LE edema  Rectal/GU  Deferred  Neuro:   Grossly non-focal and symmetrical throughout  Skin:   Clean and dry, no rashes, no breakdown   Diagnostic Tests:  ECHOCARDIOGRAM LIMITED REPORT       Patient Name:  Deatra Canter Date of Exam: 03/14/2019  Medical Rec #: [id number]     Height:    69.0 in  Accession #:  [id number]     Weight:    165.0 lb  Date of Birth: 10/09/1940     BSA:     1.90 m  Patient Age:  22 years      BP:      122/50 mmHg  Patient Gender: M         HR:      71 bpm.  Exam Location: [address]     Procedure: Limited Echo, Cardiac  Doppler and  Color Doppler   Indications:  R06.02 Shortness of breath. I34.1 MVP. LIMITED to evaluate  MVP.    History:    Patient has prior history of Echocardiogram examinations,  most         recent 02/10/2019.    Sonographer:  Jessee Avers, RDCS  Referring Phys: Candee Furbish MD   IMPRESSIONS    1. Left ventricular ejection fraction, by visual estimation, is 55 to  60%. There is no increased left ventricular wall thickness.  2. Left ventricular diastolic parameters are consistent with Grade I  diastolic dysfunction (impaired relaxation).  3. Mild to moderately dilated left ventricular internal cavity size.  4. Global right ventricle has normal systolc function.The right  ventricular size is normal. no increase in right ventricular wall  thickness.  5. Moderate mitral valve prolapse.  6. Moderate thickening of the mitral valve leaflet(s).  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  8. Normal pulmonary artery systolic pressure.  9. The inferior vena cava is normal in size with greater than 50%  respiratory variability, suggesting right atrial pressure of 3 mmHg.  10. A prior study was performed on 02/10/2019.  11. No significant change from prior study.  12. Prior echo: LVEF 55-60%, moderate MR with MVP.   FINDINGS  Left Ventricle: Left ventricular ejection fraction, by visual estimation,  is 55 to 60%. The left ventricular internal cavity size was mildly to  moderately dilated left ventricle. There is no increased left ventricular  wall thickness. Left ventricular  diastolic parameters are consistent with Grade I diastolic dysfunction  (impaired relaxation). Indeterminate filling pressures.   Right Ventricle: The right ventricular size is normal. No increase in  right ventricular wall thickness. Global RV systolic function is has  normal systolic function. The tricuspid regurgitant velocity is 2.34 m/s,  and with an  assumed right atrial pressure  of 3 mmHg, the estimated right ventricular systolic pressure is normal at  25.0 mmHg.   Mitral Valve: The mitral valve is myxomatous. There is moderate late  systolic prolapse of of the mitral valve. There is moderate thickening of  the mitral valve leaflet(s). MV Area by PHT, 2.48 cm. MV PHT, 88.74 msec.  Moderate mitral valve regurgitation,  with eccentric posteriorly directed jet.   Venous: The inferior vena cava is normal in size with greater than 50%  respiratory variability, suggesting right atrial pressure of 3 mmHg.   Additional Comments: A prior study was performed on 02/10/2019.    LEFT VENTRICLE     Normals  PLAX 2D  LVIDd:     6.30 cm 3.6 cm  Diastology         Normals  LVIDs:     4.10 cm 1.7 cm  LV e' lateral:  9.14 cm/s 6.42 cm/s  LV PW:     0.90 cm 1.4 cm  LV E/e' lateral: 8.3    15.4  LV IVS:    0.90 cm 1.3 cm  LV e' medial:  5.44 cm/s 6.96 cm/s  LV SV:     127 ml 79 ml  LV E/e' medial: 14.0   6.96  LV SV Index:  66.27  45 ml/m2     RIGHT VENTRICLE  RVSP:      25.0 mmHg   LEFT ATRIUM     Index   RIGHT ATRIUM  LA diam:  4.20 cm 2.21 cm/m RA Pressure: 3.00 mmHg    AORTA         Normals  Ao Root diam: 3.70 cm 31 mm   MITRAL VALVE      Normals   TRICUSPID VALVE       Normals                   TR Peak grad:  22.0 mmHg             55 ms    TR Vmax:    236.00 cm/s 288 cm/s  MV Decel Time: 306 msec 187 ms   Estimated RAP: 3.00 mmHg  MV E velocity: 76.30 cm/s 103 cm/s RVSP:      25.0 mmHg  MV A velocity: 70.70 cm/s 70.3 cm/s  MV E/A ratio: 1.08    1.5     Lyman Bishop MD  Electronically signed by Lyman Bishop MD  Signature Date/Time: 03/14/2019/4:15:55 PMThe mitral valve is myxomatous.       TRANSESOPHOGEAL ECHO REPORT       Patient Name:  SOTIRIS MARYLAND Date of Exam:  05/23/2019  Medical Rec #: [id number]     Height:    70.0 in  Accession #:  [id number]     Weight:    170.0 lb  Date of Birth: 1940-08-02     BSA:     1.948 m  Patient Age:  89 years      BP:      120/76 mmHg  Patient Gender: M         HR:      71 bpm.  Exam Location: Inpatient   Procedure: Transesophageal Echo, 3D Echo, Color Doppler, Cardiac Doppler  and       Saline Contrast Bubble Study   Indications:   I34.0 Nonrheumatic mitral (valve) insufficiency    History:     Patient has prior history of Echocardiogram examinations,  most          recent 03/14/2019. Arrythmias:RBBB; Risk  Factors:Dyslipidemia and          Hypertension. Mitral Valve Prolapse.    Sonographer:   Raquel Sarna Senior RDCS  Referring Phys: [id number] [place]  Diagnosing Phys: [name] MD   PROCEDURE: After discussion of the risks and benefits of a TEE, an  informed consent was obtained from the patient. The transesophogeal probe  was passed without difficulty through the esophogus of the patient.  Sedation performed by different physician.  The patient was monitored while under deep sedation. Anesthestetic  sedation was provided intravenously by Anesthesiology: 206mg  of Propofol.  The patient's vital signs; including heart rate, blood pressure, and  oxygen saturation; remained stable  throughout the procedure. The patient developed no complications during  the procedure.   IMPRESSIONS    1. 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  Reversal of forward flow is seen in the right sided  pulmonary veins. Mitral regurgitation is severe.  2. Left ventricular ejection fraction, by estimation, is 60 to 65%. The  left ventricle has normal function. The left  ventricle has no regional  wall motion abnormalities. Left ventricular diastolic function could not  be evaluated.  3. Right ventricular systolic function is normal. The right ventricular  size is normal. There is normal pulmonary artery systolic pressure. The  estimated right ventricular systolic pressure is 0000000 mmHg.  4. Left atrial size was mildly dilated. No left atrial/left atrial  appendage thrombus was detected.  5. Right atrial size was mildly dilated.  6. The mitral valve is myxomatous. Severe mitral valve regurgitation. No  evidence of mitral stenosis.  7. The aortic valve is normal in structure. Aortic valve regurgitation is  not visualized. No aortic stenosis is present.  8. The inferior vena cava is normal in size with greater than 50%  respiratory variability, suggesting right atrial pressure of 3 mmHg.  9. Agitated saline contrast bubble study was negative, with no evidence  of any interatrial shunt.   Conclusion(s)/Recommendation(s): Normal biventricular function without  evidence of hemodynamically significant valvular heart disease.   FINDINGS  Left Ventricle: Left ventricular ejection fraction, by estimation, is 60  to 65%. The left ventricle has normal function. The left ventricle has no  regional wall motion abnormalities. The left ventricular internal cavity  size was normal in size. There is  no left ventricular hypertrophy. Left ventricular diastolic function  could not be evaluated.   Right Ventricle: The right ventricular size is normal. No increase in  right ventricular wall thickness. Right ventricular systolic function is  normal. There is normal pulmonary artery systolic pressure. The tricuspid  regurgitant velocity is 2.54 m/s, and  with an assumed right atrial pressure of 3 mmHg, the estimated right  ventricular systolic pressure is 0000000 mmHg.   Left Atrium: Left atrial size was mildly dilated. No left atrial/left  atrial appendage  thrombus was detected.   Right Atrium: Right atrial size was mildly dilated. Prominent Eustachian  valve.   Pericardium: There is no evidence of pericardial effusion.   Mitral Valve: The mitral valve is myxomatous. There is moderate prolapse  of both leaflets of the mitral valve. There is severe thickening of the  mitral valve leaflet(s). Normal mobility of the mitral valve leaflets.  Mild mitral annular calcification.  Severe mitral valve regurgitation. No evidence of mitral valve stenosis.   Tricuspid Valve: The tricuspid valve is normal in structure. Tricuspid  valve regurgitation is mild . No evidence of tricuspid stenosis.   Aortic Valve: The aortic valve is normal in structure. Aortic valve  regurgitation is not visualized. No aortic stenosis is present.   Pulmonic Valve: The pulmonic valve was normal in structure. Pulmonic valve  regurgitation is trivial. No evidence of pulmonic stenosis.   Aorta: The aortic root is normal in size and structure. There is minimal  (Grade I) plaque.   Venous: The inferior vena cava is normal in size with greater than 50%  respiratory variability, suggesting right atrial pressure of 3 mmHg.   IAS/Shunts: No atrial level shunt detected by color flow Doppler. Agitated  saline contrast was given intravenously to evaluate for intracardiac  shunting. Agitated saline contrast bubble study was negative, with no  evidence of any interatrial shunt. There  is no evidence of a patent foramen ovale. There is no evidence of an  atrial septal defect.   Additional Comments: A pacer wire is visualized.     TRICUSPID VALVE  TR Peak grad:  25.8 mmHg  TR Vmax:    254.00 cm/s   [name] MD  Electronically signed by [name] MD  Signature Date/Time: 05/23/2019/10:03:48 AM      Impression:  Patient has mitral valve prolapse with stage D severe symptomatic primary mitral regurgitation.  He describes relatively recent onset of  exertional shortness of breath that occurs only with more strenuous physical exertion, consistent with chronic diastolic congestive heart failure, New York Heart Association functional class I-II.  I have personally reviewed the patient's  recent transthoracic and transesophageal echocardiograms.  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  There are multiple elongated primary chordae tendinae.  There do not appear to be any ruptured primary chords nor flail segments.  There are multiple jets of regurgitation with obvious flow reversal in the pulmonary veins consistent with severe mitral regurgitation.  There is normal left ventricular systolic function.  There is mild left atrial chamber enlargement.  The aortic valve appears normal.  Right ventricular size and function appear normal.  There is no significant tricuspid regurgitation.  I agree the patient would benefit from elective mitral valve repair.  Risks associated with surgery should be low and elevated only by the patient's age and reported history of mild short-term memory dysfunction.  Based upon review of the patient's TEE I feel there is a greater than 95% likelihood of successful and durable mitral valve repair.  In the absence of significant coronary artery disease the patient would likely be a good candidate for minimally invasive approach for surgery.   Plan:  The patient and his wife were counseled at length regarding the indications, risks and potential benefits of mitral valve repair.  The rationale for elective surgery has been explained, including a comparison between surgery and continued medical therapy with close follow-up.  The likelihood of successful and durable mitral valve repair has been discussed with particular reference to the findings of their recent echocardiogram.  Based upon these findings and previous  experience, I have quoted them a greater than 95 percent likelihood of successful valve repair with less than 1 percent risk of mortality or major morbidity.  Alternative surgical approaches have been discussed including a comparison between conventional sternotomy and minimally-invasive techniques.  The relative risks and benefits of each have been reviewed as they pertain to the patient's specific circumstances, and expectations for the patient's postoperative convalescence has been discussed.  The patient desires to proceed with surgery as soon as practical.  We tentatively plan to proceed on June 14, 2019.  Prior to surgery the patient will need to undergo left and right heart catheterization to rule out the presence of significant coronary artery disease and evaluate right heart pressures.  We will also plan to perform CT angiography to evaluate the feasibility of peripheral cannulation for surgery.  The patient will return to our office for follow-up prior to surgery on June 13, 2019.     I spent in excess of 90 minutes during the conduct of this office consultation and >50% of this time involved direct face-to-face encounter with the patient for counseling and/or coordination of their care.   Valentina Gu. [name], MD 05/26/2019 10:12 AM

## 2019-05-26 NOTE — Telephone Encounter (Signed)
-----   Message from Sherren Mocha, MD sent at 05/26/2019  4:58 PM EDT ----- Sounds good - we will get all 3 set up. Some might be with CMac my cath days are limited the next few weeks with hospital service and Spring Break. 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  Surgery scheduled 4/6 - can you get him in some time in the next 2 weeks?  Thx Cub

## 2019-05-26 NOTE — Patient Instructions (Addendum)
   Continue taking all current medications without change through the day before surgery.  Make sure to bring all of your medications with you when you come for your Pre-Admission Testing appointment at [facility]-Stay [facility].  Have nothing to eat or drink after midnight the night before surgery.  On the morning of surgery do not take any medications.  At your appointment for Pre-Admission Testing at the [facility]Stay [facility] you will be asked to sign permission forms for your upcoming surgery.  By definition your signature on these forms implies that you and/or your designee provide full informed consent for your planned surgical procedure(s), that alternative treatment options have been discussed, that you understand and accept any and all potential risks, and that you have some understanding of what to expect for your post-operative convalescence.  For any major cardiac surgical procedure potential operative risks include but are not limited to at least some risk of death, stroke or other neurologic complication, myocardial infarction, congestive heart failure, respiratory failure, renal failure, bleeding requiring blood transfusion and/or reexploration, irregular heart rhythm, heart block or bradycardia requiring permanent pacemaker, pneumonia, pericardial effusion, pleural effusion, wound infection, pulmonary embolus or other thromboembolic complication, chronic pain, or other complications related to the specific procedure(s) performed.  Please call to schedule a follow-up appointment in our office prior to surgery if you have any unresolved questions about your planned surgical procedure, the associated risks, alternative treatment options, and/or expectations for your post-operative recovery.   

## 2019-05-26 NOTE — Progress Notes (Signed)
[address]       , [id number]             [phone]     CARDIOTHORACIC SURGERY CONSULTATION REPORT  Referring Provider is [name], MD PCP is [name], MD  Chief Complaint  Alexander [name] presents with  . 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  Alexander [name] states that he has known of a presence of a heart murmur and mitral valve prolapse for many years.  For a long time he was followed by Dr. Mare Ferrari, and more recently he has been followed by Dr. [name].  He has remained physically very active all of his adult life and exercises on a regular basis.  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  He underwent permanent pacemaker placement and has done well ever since.  Over the last several months the Alexander [name] has developed new onset of exertional shortness of breath.  Symptoms only occur with more strenuous exertion, although the Alexander [name] had 1 or 2 episodes of severe shortness of breath following strenuous exertion that was slow to recover.  Despite this he has still remained active and he continues to exercise on a regular basis.  He was seen in follow-up recently by Dr. [name] and noted to have a more prominent systolic murmur on physical exam.  Follow-up transthoracic echocardiogram revealed normal left ventricular systolic function with at least moderate mitral regurgitation.  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  There were 3 distinct jets of regurgitation and flow reversal noted in the right-sided pulmonary veins.  There remain normal left ventricular systolic function.  There was mild left atrial enlargement.  No other significant abnormalities were noted.  Cardiothoracic surgical consultation was requested.  Alexander [name] is married and lives locally in [place] with his wife.  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  He remains quite active physically.  He enjoys golf and exercises on a regular basis.  He reports several episodes of exertional shortness of breath that have developed only following more strenuous physical exertion.  He denies shortness of breath with ordinary level activity or at rest.  He has not had PND, orthopnea, nor lower extremity edema.  He has not had tachypalpitations, dizzy spells, nor syncope.  He does admit to the development of mild short-term memory loss over the last year or so.  This has been described as annoying but not problematic from a practical standpoint and the Alexander [name] remains entirely functionally independent.  Past Medical History:  Diagnosis Date  . Hyperlipidemia   . Hypertension   . Mitral regurgitation   . MVP (mitral valve prolapse)   . Syncope     Past Surgical History:  Procedure Laterality Date  . BUBBLE STUDY  05/23/2019   Procedure: BUBBLE STUDY;  Surgeon: [name], MD;  Location: [facility];  Service: Cardiovascular;;  . HERNIA REPAIR    . PACEMAKER IMPLANT N/A  10/12/2017   Procedure: PACEMAKER IMPLANT;  Surgeon: Constance Haw, MD;  Location: [facility];  Service: Cardiovascular;  Laterality: N/A;  . TEE WITHOUT CARDIOVERSION N/A 05/23/2019   Procedure: TRANSESOPHAGEAL ECHOCARDIOGRAM (TEE);  Surgeon: [name], MD;  Location: [facility];  Service: Cardiovascular;  Laterality: N/A;  . TONSILECTOMY,  ADENOIDECTOMY, BILATERAL MYRINGOTOMY AND TUBES     as child  . TOTAL KNEE ARTHROPLASTY Left   . TOTAL KNEE ARTHROPLASTY Left 02/23/2019   Procedure: TOTAL KNEE ARTHROPLASTY;  Surgeon: Gaynelle Arabian, MD;  Location: [facility];  Service: Orthopedics;  Laterality: Left;  43min    Family History  Problem Relation Age of Onset  . Alzheimer's disease Mother   . Hypertension Father     Social History   Socioeconomic History  . Marital status: Married    Spouse name: Not on file  . Number of children: Not on file  . Years of education: Not on file  . Highest education level: Not on file  Occupational History  . Not on file  Tobacco Use  . Smoking status: Never Smoker  . Smokeless tobacco: Never Used  Substance and Sexual Activity  . Alcohol use: Yes    Alcohol/week: 5.0 standard drinks    Types: 5 Glasses of wine per week  . Drug use: No  . Sexual activity: Not on file  Other Topics Concern  . Not on file  Social History Narrative  . Not on file   Social Determinants of Health   Financial Resource Strain:   . Difficulty of Paying Living Expenses:   Food Insecurity:   . Worried About Charity fundraiser in the Last Year:   . Arboriculturist in the Last Year:   Transportation Needs:   . Film/video editor (Medical):   Marland Kitchen Lack of Transportation (Non-Medical):   Physical Activity:   . Days of Exercise per Week:   . Minutes of Exercise per Session:   Stress:   . Feeling of Stress :   Social Connections:   . Frequency of Communication with Friends and Family:   . Frequency of Social Gatherings with Friends and Family:   . Attends Religious Services:   . Active Member of Clubs or Organizations:   . Attends Archivist Meetings:   Marland Kitchen Marital Status:   Intimate Partner Violence:   . Fear of Current or Ex-Partner:   . Emotionally Abused:   Marland Kitchen Physically Abused:   . Sexually Abused:     Current Outpatient Medications  Medication Sig Dispense Refill  .  acetaminophen (TYLENOL) 500 MG tablet Take 500-1,000 mg by mouth every 6 (six) hours as needed for mild pain.    . Ascorbic Acid (VITAMIN C) 1000 MG tablet Take 1,000 mg by mouth daily.    Marland Kitchen aspirin EC 81 MG tablet Take 1 tablet (81 mg total) by mouth daily. 90 tablet 3  . Cholecalciferol (VITAMIN D) 50 MCG (2000 UT) tablet Take 2,000 Units by mouth daily.    . Multiple Vitamin (MULTIVITAMIN WITH MINERALS) TABS tablet Take 1 tablet by mouth daily.    Marland Kitchen zolpidem (AMBIEN) 10 MG tablet TAKE ONE TABLET AT BEDTIME AS NEEDED (Alexander [name] taking differently: Take 10 mg by mouth at bedtime as needed for sleep. ) 90 tablet 0   No current facility-administered medications for this visit.    Allergies  Allergen Reactions  . Nuts Hives, Itching and Swelling    Possible reaction to hazelnuts (tongue swelling)  Review of Systems:   General:  normal appetite, normal energy, no weight gain, no weight loss, no fever  Cardiac:  no chest pain with exertion, no chest pain at rest, +SOB with more strenuous exertion, no resting SOB, no PND, no orthopnea, no palpitations, no arrhythmia, no atrial fibrillation, no LE edema, no dizzy spells, no syncope  Respiratory:  no shortness of breath, no home oxygen, no productive cough, no dry cough, no bronchitis, no wheezing, no hemoptysis, no asthma, no pain with inspiration or cough, no sleep apnea, no CPAP at night  GI:   no difficulty swallowing, no reflux, no frequent heartburn, no hiatal hernia, no abdominal pain, no constipation, no diarrhea, no hematochezia, no hematemesis, no melena  GU:   no dysuria,  no frequency, no urinary tract infection, no hematuria, no enlarged prostate, no kidney stones, no kidney disease  Vascular:  no pain suggestive of claudication, no pain in feet, no leg cramps, no varicose veins, no DVT, no non-healing foot ulcer  Neuro:   no stroke, no TIA's, no seizures, no headaches, no temporary blindness one eye,  no slurred speech, no  peripheral neuropathy, no chronic pain, no instability of gait, + memory/cognitive dysfunction  Musculoskeletal: no arthritis, no joint swelling, no myalgias, no difficulty walking, normal mobility   Skin:   no rash, no itching, no skin infections, no pressure sores or ulcerations  Psych:   no anxiety, no depression, no nervousness, no unusual recent stress  Eyes:   no blurry vision, no floaters, no recent vision changes, + wears glasses only for reading  ENT:   no hearing loss, no loose or painful teeth, no dentures, last saw dentist yesterday for tooth extraction  Hematologic:  no easy bruising, no abnormal bleeding, no clotting disorder, no frequent epistaxis  Endocrine:  no diabetes, does not check CBG's at home     Physical Exam:   BP 133/85 (BP Location: Left Arm, Alexander [name] Position: Sitting, Cuff Size: Normal)   Pulse 69   Temp (!) 97.3 F (36.3 C)   Resp 16   Ht 5' 9.5" (1.765 m)   Wt 170 lb (77.1 kg)   SpO2 98% Comment: RA  BMI 24.74 kg/m   General:  Thin,  well-appearing  HEENT:  Unremarkable   Neck:   no JVD, no bruits, no adenopathy   Chest:   clear to auscultation, symmetrical breath sounds, no wheezes, no rhonchi   CV:   RRR, grade IV/VI holosystolic murmur   Abdomen:  soft, non-tender, no masses   Extremities:  warm, well-perfused, pulses palpable, no LE edema  Rectal/GU  Deferred  Neuro:   Grossly non-focal and symmetrical throughout  Skin:   Clean and dry, no rashes, no breakdown   Diagnostic Tests:  ECHOCARDIOGRAM LIMITED REPORT       Alexander [name] Name:  Alexander [name] Date of Exam: 03/14/2019  Medical Rec #: [id number]     Height:    69.0 in  Accession #:  [id number]     Weight:    165.0 lb  Date of Birth: Oct 28, 1940     BSA:     1.90 m  Alexander [name] Age:  28 years      BP:      122/50 mmHg  Alexander [name] Gender: M         HR:      71 bpm.  Exam Location: [address]     Procedure: Limited Echo, Cardiac  Doppler and Color Doppler   Indications:  R06.02  Shortness of breath. I34.1 MVP. LIMITED to evaluate  MVP.    History:    Alexander [name] has prior history of Echocardiogram examinations,  most         recent 02/10/2019.    Sonographer:  Jessee Avers, RDCS  Referring Phys: Candee Furbish MD   IMPRESSIONS    1. Left ventricular ejection fraction, by visual estimation, is 55 to  60%. There is no increased left ventricular wall thickness.  2. Left ventricular diastolic parameters are consistent with Grade I  diastolic dysfunction (impaired relaxation).  3. Mild to moderately dilated left ventricular internal cavity size.  4. Global right ventricle has normal systolc function.The right  ventricular size is normal. no increase in right ventricular wall  thickness.  5. Moderate mitral valve prolapse.  6. Moderate thickening of the mitral valve leaflet(s).  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  8. Normal pulmonary artery systolic pressure.  9. The inferior vena cava is normal in size with greater than 50%  respiratory variability, suggesting right atrial pressure of 3 mmHg.  10. A prior study was performed on 02/10/2019.  11. No significant change from prior study.  12. Prior echo: LVEF 55-60%, moderate MR with MVP.   FINDINGS  Left Ventricle: Left ventricular ejection fraction, by visual estimation,  is 55 to 60%. The left ventricular internal cavity size was mildly to  moderately dilated left ventricle. There is no increased left ventricular  wall thickness. Left ventricular  diastolic parameters are consistent with Grade I diastolic dysfunction  (impaired relaxation). Indeterminate filling pressures.   Right Ventricle: The right ventricular size is normal. No increase in  right ventricular wall thickness. Global RV systolic function is has  normal systolic function. The tricuspid regurgitant velocity is 2.34 m/s,  and with an  assumed right atrial pressure  of 3 mmHg, the estimated right ventricular systolic pressure is normal at  25.0 mmHg.   Mitral Valve: The mitral valve is myxomatous. There is moderate late  systolic prolapse of of the mitral valve. There is moderate thickening of  the mitral valve leaflet(s). MV Area by PHT, 2.48 cm. MV PHT, 88.74 msec.  Moderate mitral valve regurgitation,  with eccentric posteriorly directed jet.   Venous: The inferior vena cava is normal in size with greater than 50%  respiratory variability, suggesting right atrial pressure of 3 mmHg.   Additional Comments: A prior study was performed on 02/10/2019.    LEFT VENTRICLE     Normals  PLAX 2D  LVIDd:     6.30 cm 3.6 cm  Diastology         Normals  LVIDs:     4.10 cm 1.7 cm  LV e' lateral:  9.14 cm/s 6.42 cm/s  LV PW:     0.90 cm 1.4 cm  LV E/e' lateral: 8.3    15.4  LV IVS:    0.90 cm 1.3 cm  LV e' medial:  5.44 cm/s 6.96 cm/s  LV SV:     127 ml 79 ml  LV E/e' medial: 14.0   6.96  LV SV Index:  66.27  45 ml/m2     RIGHT VENTRICLE  RVSP:      25.0 mmHg   LEFT ATRIUM     Index   RIGHT ATRIUM  LA diam:  4.20 cm 2.21 cm/m RA Pressure: 3.00 mmHg    AORTA         Normals  Ao Root diam: 3.70 cm 31 mm  MITRAL VALVE      Normals   TRICUSPID VALVE       Normals                   TR Peak grad:  22.0 mmHg             55 ms    TR Vmax:    236.00 cm/s 288 cm/s  MV Decel Time: 306 msec 187 ms   Estimated RAP: 3.00 mmHg  MV E velocity: 76.30 cm/s 103 cm/s RVSP:      25.0 mmHg  MV A velocity: 70.70 cm/s 70.3 cm/s  MV E/A ratio: 1.08    1.5     Lyman Bishop MD  Electronically signed by Lyman Bishop MD  Signature Date/Time: 03/14/2019/4:15:55 PMThe mitral valve is myxomatous.       TRANSESOPHOGEAL ECHO REPORT       Alexander [name] Name:  FODAY BRADEN Date of Exam:  05/23/2019  Medical Rec #: [id number]     Height:    70.0 in  Accession #:  [id number]     Weight:    170.0 lb  Date of Birth: [birth year]     BSA:     1.948 m  Alexander [name] Age:  62 years      BP:      120/76 mmHg  Alexander [name] Gender: M         HR:      71 bpm.  Exam Location: Inpatient   Procedure: Transesophageal Echo, 3D Echo, Color Doppler, Cardiac Doppler  and       Saline Contrast Bubble Study   Indications:   I34.0 Nonrheumatic mitral (valve) insufficiency    History:     Alexander [name] has prior history of Echocardiogram examinations,  most          recent 03/14/2019. Arrythmias:RBBB; Risk  Factors:Dyslipidemia and          Hypertension. Mitral Valve Prolapse.    Sonographer:   Raquel Sarna Senior RDCS  Referring Phys: [id number] [place]  Diagnosing Phys: [name] MD   PROCEDURE: After discussion of the risks and benefits of a TEE, an  informed consent was obtained from the Alexander [name]. The transesophogeal probe  was passed without difficulty through the esophogus of the Alexander [name].  Sedation performed by different physician.  The Alexander [name] was monitored while under deep sedation. Anesthestetic  sedation was provided intravenously by Anesthesiology: 206mg  of Propofol.  The Alexander [name]'s vital signs; including heart rate, blood pressure, and  oxygen saturation; remained stable  throughout the procedure. The Alexander [name] developed no complications during  the procedure.   IMPRESSIONS    1. 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  Reversal of forward flow is seen in the right sided  pulmonary veins. Mitral regurgitation is severe.  2. Left ventricular ejection fraction, by estimation, is 60 to 65%. The  left ventricle has normal function. The left  ventricle has no regional  wall motion abnormalities. Left ventricular diastolic function could not  be evaluated.  3. Right ventricular systolic function is normal. The right ventricular  size is normal. There is normal pulmonary artery systolic pressure. The  estimated right ventricular systolic pressure is 0000000 mmHg.  4. Left atrial size was mildly dilated. No left atrial/left atrial  appendage thrombus was detected.  5. Right atrial size was mildly dilated.  6. The mitral valve is myxomatous. Severe mitral valve regurgitation. No  evidence of mitral stenosis.  7. The aortic valve is normal in structure. Aortic valve regurgitation is  not visualized. No aortic stenosis is present.  8. The inferior vena cava is normal in size with greater than 50%  respiratory variability, suggesting right atrial pressure of 3 mmHg.  9. Agitated saline contrast bubble study was negative, with no evidence  of any interatrial shunt.   Conclusion(s)/Recommendation(s): Normal biventricular function without  evidence of hemodynamically significant valvular heart disease.   FINDINGS  Left Ventricle: Left ventricular ejection fraction, by estimation, is 60  to 65%. The left ventricle has normal function. The left ventricle has no  regional wall motion abnormalities. The left ventricular internal cavity  size was normal in size. There is  no left ventricular hypertrophy. Left ventricular diastolic function  could not be evaluated.   Right Ventricle: The right ventricular size is normal. No increase in  right ventricular wall thickness. Right ventricular systolic function is  normal. There is normal pulmonary artery systolic pressure. The tricuspid  regurgitant velocity is 2.54 m/s, and  with an assumed right atrial pressure of 3 mmHg, the estimated right  ventricular systolic pressure is 0000000 mmHg.   Left Atrium: Left atrial size was mildly dilated. No left atrial/left  atrial appendage  thrombus was detected.   Right Atrium: Right atrial size was mildly dilated. Prominent Eustachian  valve.   Pericardium: There is no evidence of pericardial effusion.   Mitral Valve: The mitral valve is myxomatous. There is moderate prolapse  of both leaflets of the mitral valve. There is severe thickening of the  mitral valve leaflet(s). Normal mobility of the mitral valve leaflets.  Mild mitral annular calcification.  Severe mitral valve regurgitation. No evidence of mitral valve stenosis.   Tricuspid Valve: The tricuspid valve is normal in structure. Tricuspid  valve regurgitation is mild . No evidence of tricuspid stenosis.   Aortic Valve: The aortic valve is normal in structure. Aortic valve  regurgitation is not visualized. No aortic stenosis is present.   Pulmonic Valve: The pulmonic valve was normal in structure. Pulmonic valve  regurgitation is trivial. No evidence of pulmonic stenosis.   Aorta: The aortic root is normal in size and structure. There is minimal  (Grade I) plaque.   Venous: The inferior vena cava is normal in size with greater than 50%  respiratory variability, suggesting right atrial pressure of 3 mmHg.   IAS/Shunts: No atrial level shunt detected by color flow Doppler. Agitated  saline contrast was given intravenously to evaluate for intracardiac  shunting. Agitated saline contrast bubble study was negative, with no  evidence of any interatrial shunt. There  is no evidence of a patent foramen ovale. There is no evidence of an  atrial septal defect.   Additional Comments: A pacer wire is visualized.     TRICUSPID VALVE  TR Peak grad:  25.8 mmHg  TR Vmax:    254.00 cm/s   [name] MD  Electronically signed by [name] MD  Signature Date/Time: 05/23/2019/10:03:48 AM      Impression:  Alexander [name] has mitral valve prolapse with stage D severe symptomatic primary mitral regurgitation.  He describes relatively recent onset of  exertional shortness of breath that occurs only with more strenuous physical exertion, consistent with chronic diastolic congestive heart failure, New York Heart Association functional class I-II.  I have personally reviewed the Alexander [name]'s recent transthoracic and transesophageal echocardiograms.  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  There are multiple elongated primary chordae tendinae.  There do not appear to be any ruptured primary chords nor flail segments.  There are multiple jets of regurgitation with obvious flow reversal in the pulmonary veins consistent with severe mitral regurgitation.  There is normal left ventricular systolic function.  There is mild left atrial chamber enlargement.  The aortic valve appears normal.  Right ventricular size and function appear normal.  There is no significant tricuspid regurgitation.  I agree the Alexander [name] would benefit from elective mitral valve repair.  Risks associated with surgery should be low and elevated only by the Alexander [name]'s age and reported history of mild short-term memory dysfunction.  Based upon review of the Alexander [name]'s TEE I feel there is a greater than 95% likelihood of successful and durable mitral valve repair.  In the absence of significant coronary artery disease the Alexander [name] would likely be a good candidate for minimally invasive approach for surgery.   Plan:  The Alexander [name] and his wife were counseled at length regarding the indications, risks and potential benefits of mitral valve repair.  The rationale for elective surgery has been explained, including a comparison between surgery and continued medical therapy with close follow-up.  The likelihood of successful and durable mitral valve repair has been discussed with particular reference to the findings of their recent echocardiogram.  Based upon these findings and previous  experience, I have quoted them a greater than 95 percent likelihood of successful valve repair with less than 1 percent risk of mortality or major morbidity.  Alternative surgical approaches have been discussed including a comparison between conventional sternotomy and minimally-invasive techniques.  The relative risks and benefits of each have been reviewed as they pertain to the Alexander [name]'s specific circumstances, and expectations for the Alexander [name]'s postoperative convalescence has been discussed.  The Alexander [name] desires to proceed with surgery as soon as practical.  We tentatively plan to proceed on June 14, 2019.  Prior to surgery the Alexander [name] will need to undergo left and right heart catheterization to rule out the presence of significant coronary artery disease and evaluate right heart pressures.  We will also plan to perform CT angiography to evaluate the feasibility of peripheral cannulation for surgery.  The Alexander [name] will return to our office for follow-up prior to surgery on June 13, 2019.     I spent in excess of 90 minutes during the conduct of this office consultation and >50% of this time involved direct face-to-face encounter with the Alexander [name] for counseling and/or coordination of their care.   Valentina Gu. [name], MD 05/26/2019 10:12 AM

## 2019-05-26 NOTE — Telephone Encounter (Signed)
Called to arrange [facility] prior to April 6.  Left message to call back.

## 2019-05-30 NOTE — Telephone Encounter (Signed)
Called the pt and spoke with him about needing to arrange his right/left heart cath, prior to 4/6, when his mitral valve repair is scheduled.  Pt states he cannot talk right now to schedule this, for [place] the golf [place], and should be done around 5:30 pm.  Pt states he will call us first thing in the morning to have his cath scheduled and to discuss this in further detail.   Reiterated to the pt that he should call the office tomorrow at 0800 and ask to speak with [facility] or myself, so that we can schedule his L/RHC, arrange lab/covid testing prior to procedure, and discuss cath instructions. Pt states he will call first thing in the morning to have this arranged.  Will send this message to [facility] as a general FYI, to make her aware that pt will be calling us  tomorrow morning, to have his cath arranged.  Pt was appreciative for all the follow-up and agrees with this plan.

## 2019-05-31 NOTE — Addendum Note (Signed)
Addended by: Lauree Chandler D on: 05/31/2019 02:29 PM   Modules accepted: Orders, SmartSet

## 2019-05-31 NOTE — Telephone Encounter (Signed)
Scheduled patient for [facility] 3/29 with Dr. Angelena Form. COVID test scheduled 3/27. He understands to quarantine between COVID test and procedure. Patient understands no food after midnight, clear liquids until 0500 and to take meds as directed.  Instruction letter released to MyChart and mailed to patient's home per request.

## 2019-06-01 ENCOUNTER — Telehealth: Payer: Self-pay | Admitting: *Deleted

## 2019-06-01 NOTE — Telephone Encounter (Signed)
-----   Message from Theodoro Parma, RN sent at 05/31/2019 11:28 AM EDT ----- Regarding: [facility] 3/29 L/[facility] scheduled 3/29 with Dr. Angelena Form Dx: MR Arrival time: 0530 for 0730 case Labs drawn 3/11 COVID test scheduled 3/27  Webb Silversmith- Dr. Josefa Half wife, Vaughan Basta, is very anxious. I told her you'd call later this week to review everything again.  Dr. Angelena Form- Just FYI. She would like to meet you day of. She mentioned many plans but the last one was she would come with the patient in the morning and then leave to pick him up post-cath. I told her to make sure Dr. Alphonzo Cruise knew her plan so she could be called prior to procedure if she changes her mind (again).   Thanks everyone, Valetta Fuller

## 2019-06-02 ENCOUNTER — Telehealth: Payer: Self-pay | Admitting: *Deleted

## 2019-06-02 NOTE — Telephone Encounter (Signed)
Voicemail message, pt's wife, LMTCB to review procedure instructions

## 2019-06-02 NOTE — Telephone Encounter (Signed)
    COVID-19 Pre-Screening Questions:  . In the past 7 to 10 days have you had a cough,  shortness of breath, headache, congestion, fever (100 or greater) body aches, chills, sore throat, or sudden loss of taste or sense of smell? no . Have you been around anyone with known Covid 19 in the past 7-10 days? no . Have you been around anyone who is awaiting Covid 19 test results in the past 7 to 10 days? no . Have you been around anyone who has been exposed to Covid 19, or has mentioned symptoms of Covid 19 within the past 7 to 10 days? no    I reviewed procedure/mask/visitor instructions, COVID-19 questions with patient's wife.

## 2019-06-02 NOTE — Telephone Encounter (Signed)
Patient's wife returning call. Transferred to nurse. 

## 2019-06-02 NOTE — Telephone Encounter (Signed)
Pt contacted pre-catheterization scheduled at [facility] for: Monday June 06, 2019 7:30 AM Verified arrival time and place: [facility]) at: 5:30 AM   No solid food after midnight prior to cath, clear liquids until 5 AM day of procedure.   AM meds can be  taken pre-cath with sip of water including: ASA 81 mg   Confirmed patient has responsible adult to drive home post procedure and observe 24 hours after arriving home:   Currently, due to Covid-19 pandemic, only one person will be allowed with patient. Must be the same person for patient's entire stay and will be required to wear a mask. They will be asked to wait in the waiting room for the duration of the patient's stay.  Patients are required to wear a mask when they enter the hospital.   I spoke with patient, [place] asked me to call his wife to review instructions. Pt's wife asked me to call at 12:10 PM today to review instructions

## 2019-06-04 ENCOUNTER — Other Ambulatory Visit (HOSPITAL_COMMUNITY)
Admission: RE | Admit: 2019-06-04 | Discharge: 2019-06-04 | Disposition: A | Discharge status: Home / Self Care | Payer: Medicare Other | Source: Ambulatory Visit | Attending: Cardiovascular Disease | Admitting: Cardiovascular Disease

## 2019-06-04 DIAGNOSIS — Z01812 Encounter for preprocedural laboratory examination: Secondary | ICD-10-CM | POA: Insufficient documentation

## 2019-06-04 DIAGNOSIS — Z20822 Contact with and (suspected) exposure to covid-19: Secondary | ICD-10-CM | POA: Insufficient documentation

## 2019-06-04 DIAGNOSIS — I340 Nonrheumatic mitral (valve) insufficiency: Secondary | ICD-10-CM

## 2019-06-04 LAB — SARS CORONAVIRUS 2 (TAT 6-24 HRS): SARS Coronavirus 2: NEGATIVE

## 2019-06-06 ENCOUNTER — Other Ambulatory Visit: Payer: Self-pay

## 2019-06-06 ENCOUNTER — Encounter (HOSPITAL_COMMUNITY)
Admission: RE | Disposition: A | Discharge status: Home / Self Care | Payer: Self-pay | Source: Home / Self Care | Attending: Cardiovascular Disease

## 2019-06-06 ENCOUNTER — Ambulatory Visit (HOSPITAL_COMMUNITY)
Admission: RE | Admit: 2019-06-06 | Discharge: 2019-06-06 | Disposition: A | Discharge status: Home / Self Care | Payer: Medicare Other | Attending: Cardiovascular Disease | Admitting: Cardiovascular Disease

## 2019-06-06 DIAGNOSIS — I341 Nonrheumatic mitral (valve) prolapse: Secondary | ICD-10-CM | POA: Diagnosis not present

## 2019-06-06 DIAGNOSIS — I340 Nonrheumatic mitral (valve) insufficiency: Secondary | ICD-10-CM

## 2019-06-06 DIAGNOSIS — Z7982 Long term (current) use of aspirin: Secondary | ICD-10-CM | POA: Diagnosis not present

## 2019-06-06 DIAGNOSIS — Z950 Presence of cardiac pacemaker: Secondary | ICD-10-CM | POA: Insufficient documentation

## 2019-06-06 DIAGNOSIS — I10 Essential (primary) hypertension: Secondary | ICD-10-CM | POA: Insufficient documentation

## 2019-06-06 DIAGNOSIS — E785 Hyperlipidemia, unspecified: Secondary | ICD-10-CM | POA: Insufficient documentation

## 2019-06-06 HISTORY — PX: RIGHT/LEFT HEART CATH AND CORONARY ANGIOGRAPHY: CATH118266

## 2019-06-06 LAB — POCT I-STAT 7, (LYTES, BLD GAS, ICA,H+H)
Acid-Base Excess: 1 mmol/L (ref 0.0–2.0)
Bicarbonate: 28 mmol/L (ref 20.0–28.0)
Calcium, Ion: 1.22 mmol/L (ref 1.15–1.40)
HCT: 38 % — ABNORMAL LOW (ref 39.0–52.0)
Hemoglobin: 12.9 g/dL — ABNORMAL LOW (ref 13.0–17.0)
O2 Saturation: 99 %
Potassium: 3.8 mmol/L (ref 3.5–5.1)
Sodium: 142 mmol/L (ref 135–145)
TCO2: 30 mmol/L (ref 22–32)
pCO2 arterial: 53.3 mmHg — ABNORMAL HIGH (ref 32.0–48.0)
pH, Arterial: 7.328 — ABNORMAL LOW (ref 7.350–7.450)
pO2, Arterial: 161 mmHg — ABNORMAL HIGH (ref 83.0–108.0)

## 2019-06-06 LAB — POCT I-STAT EG7
Acid-Base Excess: 1 mmol/L (ref 0.0–2.0)
Bicarbonate: 27.6 mmol/L (ref 20.0–28.0)
Calcium, Ion: 1.23 mmol/L (ref 1.15–1.40)
HCT: 38 % — ABNORMAL LOW (ref 39.0–52.0)
Hemoglobin: 12.9 g/dL — ABNORMAL LOW (ref 13.0–17.0)
O2 Saturation: 78 %
Potassium: 3.8 mmol/L (ref 3.5–5.1)
Sodium: 141 mmol/L (ref 135–145)
TCO2: 29 mmol/L (ref 22–32)
pCO2, Ven: 53.5 mmHg (ref 44.0–60.0)
pH, Ven: 7.32 (ref 7.250–7.430)
pO2, Ven: 47 mmHg — ABNORMAL HIGH (ref 32.0–45.0)

## 2019-06-06 SURGERY — RIGHT/LEFT HEART CATH AND CORONARY ANGIOGRAPHY
Anesthesia: LOCAL

## 2019-06-06 MED ORDER — LABETALOL HCL 5 MG/ML IV SOLN: 10.0000 mg | INTRAVENOUS | Status: DC | PRN

## 2019-06-06 MED ORDER — VERAPAMIL HCL 2.5 MG/ML IV SOLN
INTRAVENOUS | Status: DC | PRN
  Administered 2019-06-06: 10 mL via INTRA_ARTERIAL

## 2019-06-06 MED ORDER — HEPARIN SODIUM (PORCINE) 1000 UNIT/ML IJ SOLN
INTRAMUSCULAR | Status: DC | PRN
  Administered 2019-06-06: 4000 [IU] via INTRAVENOUS

## 2019-06-06 MED ORDER — ONDANSETRON HCL 4 MG/2ML IJ SOLN: 4.0000 mg | Freq: Four times a day (QID) | INTRAMUSCULAR | Status: DC | PRN

## 2019-06-06 MED ORDER — IOHEXOL 350 MG/ML SOLN
INTRAVENOUS | Status: DC | PRN
  Administered 2019-06-06: 55 mL

## 2019-06-06 MED ORDER — SODIUM CHLORIDE 0.9 % WEIGHT BASED INFUSION
3.0000 mL/kg/h | INTRAVENOUS | Status: AC
  Administered 2019-06-06: 06:00:00 3 mL/kg/h via INTRAVENOUS

## 2019-06-06 MED ORDER — SODIUM CHLORIDE 0.9 % IV SOLN: INTRAVENOUS | Status: AC

## 2019-06-06 MED ORDER — SODIUM CHLORIDE 0.9% FLUSH: 3.0000 mL | Freq: Two times a day (BID) | INTRAVENOUS | Status: DC

## 2019-06-06 MED ORDER — HEPARIN (PORCINE) IN NACL 1000-0.9 UT/500ML-% IV SOLN
INTRAVENOUS | Status: DC | PRN
  Administered 2019-06-06 (×2): 500 mL

## 2019-06-06 MED ORDER — FENTANYL CITRATE (PF) 100 MCG/2ML IJ SOLN
INTRAMUSCULAR | Status: DC | PRN
  Administered 2019-06-06 (×2): 25 ug via INTRAVENOUS

## 2019-06-06 MED ORDER — ACETAMINOPHEN 325 MG PO TABS: 650.0000 mg | ORAL_TABLET | ORAL | Status: DC | PRN

## 2019-06-06 MED ORDER — LIDOCAINE HCL (PF) 1 % IJ SOLN
INTRAMUSCULAR | Status: AC
  Filled 2019-06-06: qty 30

## 2019-06-06 MED ORDER — LIDOCAINE HCL (PF) 1 % IJ SOLN
INTRAMUSCULAR | Status: DC | PRN
  Administered 2019-06-06: 2 mL

## 2019-06-06 MED ORDER — HEPARIN (PORCINE) IN NACL 1000-0.9 UT/500ML-% IV SOLN
INTRAVENOUS | Status: AC
  Filled 2019-06-06: qty 1000

## 2019-06-06 MED ORDER — SODIUM CHLORIDE 0.9 % IV SOLN: 250.0000 mL | INTRAVENOUS | Status: DC | PRN

## 2019-06-06 MED ORDER — ASPIRIN 81 MG PO CHEW: 81.0000 mg | CHEWABLE_TABLET | ORAL | Status: DC

## 2019-06-06 MED ORDER — MIDAZOLAM HCL 2 MG/2ML IJ SOLN
INTRAMUSCULAR | Status: AC
  Filled 2019-06-06: qty 2

## 2019-06-06 MED ORDER — SODIUM CHLORIDE 0.9% FLUSH: 3.0000 mL | INTRAVENOUS | Status: DC | PRN

## 2019-06-06 MED ORDER — SODIUM CHLORIDE 0.9 % WEIGHT BASED INFUSION: 1.0000 mL/kg/h | INTRAVENOUS | Status: DC

## 2019-06-06 MED ORDER — VERAPAMIL HCL 2.5 MG/ML IV SOLN
INTRAVENOUS | Status: AC
  Filled 2019-06-06: qty 2

## 2019-06-06 MED ORDER — MIDAZOLAM HCL 2 MG/2ML IJ SOLN
INTRAMUSCULAR | Status: DC | PRN
  Administered 2019-06-06 (×2): 1 mg via INTRAVENOUS

## 2019-06-06 MED ORDER — HEPARIN SODIUM (PORCINE) 1000 UNIT/ML IJ SOLN
INTRAMUSCULAR | Status: AC
  Filled 2019-06-06: qty 1

## 2019-06-06 MED ORDER — HYDRALAZINE HCL 20 MG/ML IJ SOLN: 10.0000 mg | INTRAMUSCULAR | Status: DC | PRN

## 2019-06-06 MED ORDER — FENTANYL CITRATE (PF) 100 MCG/2ML IJ SOLN
INTRAMUSCULAR | Status: AC
  Filled 2019-06-06: qty 2

## 2019-06-06 SURGICAL SUPPLY — 12 items
CATH 5FR JL3.5 JR4 ANG PIG MP (CATHETERS) ×2
CATH BALLN WEDGE 5F 110CM (CATHETERS) ×2
DEVICE RAD COMP TR BAND LRG (VASCULAR PRODUCTS) ×2
GLIDESHEATH SLEND SS 6F .021 (SHEATH) ×2
INQWIRE 1.5J .035X260CM (WIRE) ×2
KIT HEART LEFT (KITS) ×2
PACK CARDIAC CATHETERIZATION (CUSTOM PROCEDURE TRAY) ×2
SHEATH GLIDE SLENDER 4/5FR (SHEATH) ×2
TRANSDUCER W/STOPCOCK (MISCELLANEOUS) ×2
TUBING ART PRESS 72  MALE/FEM (TUBING) ×2
TUBING ART PRESS 72 MALE/FEM (TUBING) ×1
TUBING CIL FLEX 10 FLL-RA (TUBING) ×2

## 2019-06-06 NOTE — Discharge Instructions (Signed)
Radial Site Care  This sheet gives you information about how to care for yourself after your procedure. Your health care provider may also give you more specific instructions. If you have problems or questions, contact your health care provider. What can I expect after the procedure? After the procedure, it is common to have:  Bruising and tenderness at the catheter insertion area. Follow these instructions at home: Medicines  Take over-the-counter and prescription medicines only as told by your health care provider. Insertion site care  Follow instructions from your health care provider about how to take care of your insertion site. Make sure you: ? Wash your hands with soap and water before you change your bandage (dressing). If soap and water are not available, use hand sanitizer. ? Change your dressing as told by your health care provider. ? Leave stitches (sutures), skin glue, or adhesive strips in place. These skin closures may need to stay in place for 2 weeks or longer. If adhesive strip edges start to loosen and curl up, you may trim the loose edges. Do not remove adhesive strips completely unless your health care provider tells you to do that.  Check your insertion site every day for signs of infection. Check for: ? Redness, swelling, or pain. ? Fluid or blood. ? Pus or a bad smell. ? Warmth.  Do not take baths, swim, or use a hot tub until your health care provider approves.  You may shower 24-48 hours after the procedure, or as directed by your health care provider. ? Remove the dressing and gently wash the site with plain soap and water. ? Pat the area dry with a clean towel. ? Do not rub the site. That could cause bleeding.  Do not apply powder or lotion to the site. Activity   For 24 hours after the procedure, or as directed by your health care provider: ? Do not flex or bend the affected arm. ? Do not push or pull heavy objects with the affected arm. ? Do not  drive yourself home from the hospital or clinic. You may drive 24 hours after the procedure unless your health care provider tells you not to. ? Do not operate machinery or power tools.  Do not lift anything that is heavier than 10 lb (4.5 kg), or the limit that you are told, until your health care provider says that it is safe.  Ask your health care provider when it is okay to: ? Return to work or school. ? Resume usual physical activities or sports. ? Resume sexual activity. General instructions  If the catheter site starts to bleed, raise your arm and put firm pressure on the site. If the bleeding does not stop, get help right away. This is a medical emergency.  If you went home on the same day as your procedure, a responsible adult should be with you for the first 24 hours after you arrive home.  Keep all follow-up visits as told by your health care provider. This is important. Contact a health care provider if:  You have a fever.  You have redness, swelling, or yellow drainage around your insertion site. Get help right away if:  You have unusual pain at the radial site.  The catheter insertion area swells very fast.  The insertion area is bleeding, and the bleeding does not stop when you hold steady pressure on the area.  Your arm or hand becomes pale, cool, tingly, or numb. These symptoms may represent a serious problem   that is an emergency. Do not wait to see if the symptoms will go away. Get medical help right away. Call your local emergency services (911 in the [place] Do not drive yourself to the hospital. Summary  After the procedure, it is common to have bruising and tenderness at the site.  Follow instructions from your health care provider about how to take care of your radial site wound. Check the wound every day for signs of infection.  Do not lift anything that is heavier than 10 lb (4.5 kg), or the limit that you are told, until your health care provider says  that it is safe. This information is not intended to replace advice given to you by your health care provider. Make sure you discuss any questions you have with your health care provider. Document Revised: 04/01/2017 Document Reviewed: 04/01/2017 Elsevier Patient Education  2020 [employer]  

## 2019-06-06 NOTE — Interval H&P Note (Signed)
History and Physical Interval Note:  06/06/2019 7:17 AM  Alexander [name]  has presented today for surgery, with the diagnosis of mr.  The various methods of treatment have been discussed with the patient and family. After consideration of risks, benefits and other options for treatment, the patient has consented to  Procedure(s): RIGHT/LEFT HEART CATH AND CORONARY ANGIOGRAPHY (N/A) as a surgical intervention.  The patient's history has been reviewed, patient examined, no change in status, stable for surgery.  I have reviewed the patient's chart and labs.  Questions were answered to the patient's satisfaction.    Cath Lab Visit (complete for each Cath Lab visit)  Clinical Evaluation Leading to the Procedure:   ACS: No.  Non-ACS:    Anginal Classification: No Symptoms  Anti-ischemic medical therapy: No Therapy  Non-Invasive Test Results: No non-invasive testing performed  Prior CABG: No previous CABG        Lauree Chandler

## 2019-06-07 ENCOUNTER — Encounter: Payer: Self-pay | Admitting: Cardiology

## 2019-06-09 NOTE — Progress Notes (Signed)
[place], [place] - [address] [address] Phone: [phone] Fax: [phone]      Your procedure is scheduled on 06/14/2019 Tuesday.  Report to [facility] Main Entrance "A" at 5:30 A.M., and check in at the Admitting office.  Call this number if you have problems the morning of surgery:  [phone]  Call [phone] if you have any questions prior to your surgery date Monday-Friday 8am-4pm    Remember:  Do not eat or drink after midnight the night before your surgery    Take these medicines the morning of surgery with A SIP OF WATER   None  Follow your surgeon's instructions on when to stop Aspirin.  If no instructions were given by your surgeon then you will need to call the office to get those instructions.     As of today, STOP taking any Aspirin (unless otherwise instructed by your surgeon) and Aspirin containing products, Aleve, Naproxen, Ibuprofen, Motrin, Advil, Goody's, BC's, all herbal medications, fish oil, and all vitamins.                      Do not wear jewelry            Do not wear lotions, powders, colognes, or deodorant.            Men may shave face and neck.            Do not bring valuables to the hospital.            [facility] is not responsible for any belongings or valuables.  Do NOT Smoke (Tobacco/Vapping) or drink Alcohol 24 hours prior to your procedure If you use a CPAP at night, you may bring all equipment for your overnight stay.   Contacts, glasses, dentures or bridgework may not be worn into surgery.      For patients admitted to the hospital, discharge time will be determined by your treatment team.   Patients discharged the day of surgery will not be allowed to drive home, and someone needs to stay with them for 24 hours.    Special instructions:   [place]- Preparing For Surgery  Before surgery, you can play an important role. Because skin is not sterile, your skin needs to be as  free of germs as possible. You can reduce the number of germs on your skin by washing with CHG (chlorahexidine gluconate) Soap before surgery.  CHG is an antiseptic cleaner which kills germs and bonds with the skin to continue killing germs even after washing.    Oral Hygiene is also important to reduce your risk of infection.  Remember - BRUSH YOUR TEETH THE MORNING OF SURGERY WITH YOUR REGULAR TOOTHPASTE  Please do not use if you have an allergy to CHG or antibacterial soaps. If your skin becomes reddened/irritated stop using the CHG.  Do not shave (including legs and underarms) for at least 48 hours prior to first CHG shower. It is OK to shave your face.  Please follow these instructions carefully.   1. Shower the NIGHT BEFORE SURGERY and the MORNING OF SURGERY with CHG Soap.   2. If you chose to wash your hair, wash your hair first as usual with your normal shampoo.  3. After you shampoo, rinse your hair and body thoroughly to remove the shampoo.  4. Use CHG as you would any other liquid soap. You can apply CHG directly to the  skin and wash gently with a scrungie or a clean washcloth.   5. Apply the CHG Soap to your body ONLY FROM THE NECK DOWN.  Do not use on open wounds or open sores. Avoid contact with your eyes, ears, mouth and genitals (private parts). Wash Face and genitals (private parts)  with your normal soap.   6. Wash thoroughly, paying special attention to the area where your surgery will be performed.  7. Thoroughly rinse your body with warm water from the neck down.  8. DO NOT shower/wash with your normal soap after using and rinsing off the CHG Soap.  9. Pat yourself dry with a CLEAN TOWEL.  10. Wear CLEAN PAJAMAS to bed the night before surgery, wear comfortable clothes the morning of surgery  11. Place CLEAN SHEETS on your bed the night of your first shower and DO NOT SLEEP WITH PETS.   Day of Surgery:   Do not apply any deodorants/lotions.  Please wear clean  clothes to the hospital/surgery center.   Remember to brush your teeth WITH YOUR REGULAR TOOTHPASTE.   Please read over the following fact sheets that you were given.

## 2019-06-10 ENCOUNTER — Ambulatory Visit (HOSPITAL_COMMUNITY)
Admission: RE | Admit: 2019-06-10 | Discharge: 2019-06-10 | Disposition: A | Discharge status: Home / Self Care | Payer: Medicare Other | Source: Ambulatory Visit | Attending: Thoracic Surgery (Cardiothoracic Vascular Surgery) | Admitting: Thoracic Surgery (Cardiothoracic Vascular Surgery)

## 2019-06-10 ENCOUNTER — Other Ambulatory Visit: Payer: Self-pay | Admitting: Surgical

## 2019-06-10 ENCOUNTER — Encounter (HOSPITAL_COMMUNITY): Payer: Self-pay

## 2019-06-10 ENCOUNTER — Other Ambulatory Visit (HOSPITAL_COMMUNITY)
Admission: RE | Admit: 2019-06-10 | Discharge: 2019-06-10 | Disposition: A | Discharge status: Home / Self Care | Payer: Medicare Other | Source: Ambulatory Visit | Attending: Thoracic Surgery (Cardiothoracic Vascular Surgery) | Admitting: Thoracic Surgery (Cardiothoracic Vascular Surgery)

## 2019-06-10 ENCOUNTER — Encounter: Payer: Self-pay | Admitting: Cardiology

## 2019-06-10 ENCOUNTER — Other Ambulatory Visit: Payer: Self-pay

## 2019-06-10 ENCOUNTER — Encounter (HOSPITAL_COMMUNITY)
Admission: RE | Admit: 2019-06-10 | Discharge: 2019-06-10 | Disposition: A | Discharge status: Home / Self Care | Payer: Medicare Other | Source: Ambulatory Visit | Attending: Thoracic Surgery (Cardiothoracic Vascular Surgery) | Admitting: Thoracic Surgery (Cardiothoracic Vascular Surgery)

## 2019-06-10 ENCOUNTER — Other Ambulatory Visit (HOSPITAL_COMMUNITY): Payer: Medicare Other

## 2019-06-10 DIAGNOSIS — Z01818 Encounter for other preprocedural examination: Secondary | ICD-10-CM | POA: Diagnosis not present

## 2019-06-10 DIAGNOSIS — I6523 Occlusion and stenosis of bilateral carotid arteries: Secondary | ICD-10-CM | POA: Diagnosis not present

## 2019-06-10 DIAGNOSIS — Z79899 Other long term (current) drug therapy: Secondary | ICD-10-CM | POA: Insufficient documentation

## 2019-06-10 DIAGNOSIS — Z952 Presence of prosthetic heart valve: Secondary | ICD-10-CM | POA: Diagnosis not present

## 2019-06-10 DIAGNOSIS — Z20822 Contact with and (suspected) exposure to covid-19: Secondary | ICD-10-CM | POA: Diagnosis not present

## 2019-06-10 DIAGNOSIS — I340 Nonrheumatic mitral (valve) insufficiency: Secondary | ICD-10-CM | POA: Diagnosis not present

## 2019-06-10 DIAGNOSIS — Z7982 Long term (current) use of aspirin: Secondary | ICD-10-CM | POA: Diagnosis not present

## 2019-06-10 DIAGNOSIS — Z96652 Presence of left artificial knee joint: Secondary | ICD-10-CM | POA: Insufficient documentation

## 2019-06-10 DIAGNOSIS — Z950 Presence of cardiac pacemaker: Secondary | ICD-10-CM | POA: Diagnosis not present

## 2019-06-10 DIAGNOSIS — E785 Hyperlipidemia, unspecified: Secondary | ICD-10-CM | POA: Diagnosis not present

## 2019-06-10 DIAGNOSIS — I10 Essential (primary) hypertension: Secondary | ICD-10-CM | POA: Insufficient documentation

## 2019-06-10 HISTORY — DX: Cardiac murmur, unspecified: R01.1

## 2019-06-10 HISTORY — DX: Presence of cardiac pacemaker: Z95.0

## 2019-06-10 LAB — TYPE AND SCREEN
ABO/RH(D): O POS
Antibody Screen: NEGATIVE

## 2019-06-10 LAB — COMPREHENSIVE METABOLIC PANEL
ALT: 18 U/L (ref 0–44)
AST: 26 U/L (ref 15–41)
Albumin: 3.9 g/dL (ref 3.5–5.0)
Alkaline Phosphatase: 67 U/L (ref 38–126)
Anion gap: 10 (ref 5–15)
BUN: 17 mg/dL (ref 8–23)
CO2: 24 mmol/L (ref 22–32)
Calcium: 9.1 mg/dL (ref 8.9–10.3)
Chloride: 105 mmol/L (ref 98–111)
Creatinine, Ser: 0.9 mg/dL (ref 0.61–1.24)
GFR calc Af Amer: >60 mL/min (ref >60)
GFR calc non Af Amer: >60 mL/min (ref >60)
Glucose, Bld: 92 mg/dL (ref 70–99)
Potassium: 4.2 mmol/L (ref 3.5–5.1)
Sodium: 139 mmol/L (ref 135–145)
Total Bilirubin: 0.4 mg/dL (ref 0.3–1.2)
Total Protein: 6.3 g/dL — ABNORMAL LOW (ref 6.5–8.1)

## 2019-06-10 LAB — BLOOD GAS, ARTERIAL
Acid-Base Excess: 0.6 mmol/L (ref 0.0–2.0)
Bicarbonate: 24.7 mmol/L (ref 20.0–28.0)
FIO2: 21
O2 Saturation: 97.5 %
Patient temperature: 37
pCO2 arterial: 39.8 mmHg (ref 32.0–48.0)
pH, Arterial: 7.409 (ref 7.350–7.450)
pO2, Arterial: 95.1 mmHg (ref 83.0–108.0)

## 2019-06-10 LAB — CBC
HCT: 43 % (ref 39.0–52.0)
Hemoglobin: 14.4 g/dL (ref 13.0–17.0)
MCH: 31 pg (ref 26.0–34.0)
MCHC: 33.5 g/dL (ref 30.0–36.0)
MCV: 92.7 fL (ref 80.0–100.0)
Platelets: 211 10*3/uL (ref 150–400)
RBC: 4.64 MIL/uL (ref 4.22–5.81)
RDW: 13.1 % (ref 11.5–15.5)
WBC: 8.2 10*3/uL (ref 4.0–10.5)
nRBC: 0 % (ref 0.0–0.2)

## 2019-06-10 LAB — URINALYSIS, ROUTINE W REFLEX MICROSCOPIC
Bilirubin Urine: NEGATIVE
Glucose, UA: NEGATIVE mg/dL
Hgb urine dipstick: NEGATIVE
Ketones, ur: NEGATIVE mg/dL
Leukocytes,Ua: NEGATIVE
Nitrite: NEGATIVE
Protein, ur: NEGATIVE mg/dL
Specific Gravity, Urine: 1.01 (ref 1.005–1.030)
pH: 5 (ref 5.0–8.0)

## 2019-06-10 LAB — APTT: aPTT: 36 seconds (ref 24–36)

## 2019-06-10 LAB — HEMOGLOBIN A1C
Hgb A1c MFr Bld: 5.3 % (ref 4.8–5.6)
Mean Plasma Glucose: 105.41 mg/dL

## 2019-06-10 LAB — SARS CORONAVIRUS 2 (TAT 6-24 HRS): SARS Coronavirus 2: NEGATIVE

## 2019-06-10 LAB — ABO/RH: ABO/RH(D): O POS

## 2019-06-10 LAB — PROTIME-INR
INR: 1 (ref 0.8–1.2)
Prothrombin Time: 12.5 seconds (ref 11.4–15.2)

## 2019-06-10 LAB — SURGICAL PCR SCREEN
MRSA, PCR: NEGATIVE
Staphylococcus aureus: NEGATIVE

## 2019-06-10 NOTE — Progress Notes (Signed)
PCP - Dr. [name] Cardiologist - Dr. Ottie Glazier  PPM/ICD - Pacemaker Device Orders - Messaged sent to [facility] Rep Notified - Email sent   Chest x-ray - 06/10/2019 EKG - 06/06/2019 Stress Test -  ECHO - 05/26/2019 Cardiac Cath - 06/06/2019  Sleep Study - denies CPAP - N/A  Blood Thinner Instructions: N/A Aspirin Instructions: per patient, "continue taking up until the day before surgery:  ERAS Protcol - No  COVID TEST- Scheduled for today 4/2/201 after PAT appointment. Patient verbalized understanding of self-quarantine instructions, appointment time and place.   Anesthesia review: YES, abnormal EKG, cardiac history.  Patient denies shortness of breath, fever, cough and chest pain at PAT appointment  All instructions explained to the patient, with a verbal understanding of the material. Patient agrees to go over the instructions while at home for a better understanding. Patient also instructed to self quarantine after being tested for COVID-19. The opportunity to ask questions was provided.

## 2019-06-10 NOTE — Progress Notes (Signed)
PERIOPERATIVE PRESCRIPTION FOR IMPLANTED CARDIAC DEVICE PROGRAMMING  Patient Information: Name:  Alexander [name]  DOB:  Apr 01, 1940  MRN:  [id number]     Planned Procedure: Minimally Invasive Mitral Valve Repair Transesophageal Echocardiogram  Surgeon: Dr. Darylene Price  Date of Procedure: 06/14/2019  Cautery will be used.  Position during surgery: SUPINE (correct position)   Please send documentation back to:  [facility] (Fax # [phone])    Device Information:  Clinic EP Physician:  Allegra Lai, MD  Device Type:  Pacemaker Manufacturer and Phone #:  Medtronic: [id number] Pacemaker Dependent?:  No. Date of Last Device Check:  03/21/2019 Normal Device Function?:  Yes.    Electrophysiologist's Recommendations:   Have magnet available.  Provide continuous ECG monitoring when magnet is used or reprogramming is to be performed.   Procedure should not interfere with device function.  No device programming or magnet placement needed.  Per verbal orders from Allegra Lai, MD, Mechele Dawley, RN  2:24 PM 06/10/2019

## 2019-06-10 NOTE — Progress Notes (Signed)
Pre op ultrasound testing       has been completed. Preliminary results can be found under CV proc through chart review. Delancey Moraes, BS, RDMS, RVT   

## 2019-06-13 ENCOUNTER — Ambulatory Visit
Admission: RE | Admit: 2019-06-13 | Discharge: 2019-06-13 | Disposition: A | Discharge status: Home / Self Care | Payer: Medicare Other | Source: Ambulatory Visit | Attending: Thoracic Surgery (Cardiothoracic Vascular Surgery) | Admitting: Thoracic Surgery (Cardiothoracic Vascular Surgery)

## 2019-06-13 ENCOUNTER — Other Ambulatory Visit: Payer: Self-pay

## 2019-06-13 ENCOUNTER — Ambulatory Visit (INDEPENDENT_AMBULATORY_CARE_PROVIDER_SITE_OTHER): Payer: Medicare Other | Admitting: Thoracic Surgery (Cardiothoracic Vascular Surgery)

## 2019-06-13 ENCOUNTER — Encounter: Payer: Self-pay | Admitting: Thoracic Surgery (Cardiothoracic Vascular Surgery)

## 2019-06-13 VITALS — BP 143/68 | HR 74 | Temp 97.3°F | Resp 16 | Ht 70.0 in | Wt 174.0 lb

## 2019-06-13 DIAGNOSIS — I341 Nonrheumatic mitral (valve) prolapse: Secondary | ICD-10-CM

## 2019-06-13 DIAGNOSIS — I340 Nonrheumatic mitral (valve) insufficiency: Secondary | ICD-10-CM | POA: Diagnosis not present

## 2019-06-13 DIAGNOSIS — Z01818 Encounter for other preprocedural examination: Secondary | ICD-10-CM | POA: Diagnosis not present

## 2019-06-13 DIAGNOSIS — I700 Atherosclerosis of aorta: Secondary | ICD-10-CM | POA: Diagnosis not present

## 2019-06-13 DIAGNOSIS — K429 Umbilical hernia without obstruction or gangrene: Secondary | ICD-10-CM | POA: Diagnosis not present

## 2019-06-13 MED ORDER — SODIUM CHLORIDE 0.9 % IV SOLN
750.0000 mg | INTRAVENOUS | Status: DC
  Filled 2019-06-13: qty 750

## 2019-06-13 MED ORDER — MILRINONE LACTATE IN DEXTROSE 20-5 MG/100ML-% IV SOLN
0.3000 ug/kg/min | INTRAVENOUS | Status: DC
  Filled 2019-06-13: qty 100

## 2019-06-13 MED ORDER — MANNITOL 20 % IV SOLN
Freq: Once | INTRAVENOUS | Status: DC
  Filled 2019-06-13: qty 13

## 2019-06-13 MED ORDER — INSULIN REGULAR(HUMAN) IN NACL 100-0.9 UT/100ML-% IV SOLN
INTRAVENOUS | Status: AC
  Administered 2019-06-14: 1 [IU]/h via INTRAVENOUS
  Filled 2019-06-13: qty 100

## 2019-06-13 MED ORDER — IOPAMIDOL (ISOVUE-370) INJECTION 76%
75.0000 mL | Freq: Once | INTRAVENOUS | Status: AC | PRN
  Administered 2019-06-13: 75 mL via INTRAVENOUS

## 2019-06-13 MED ORDER — NITROGLYCERIN IN D5W 200-5 MCG/ML-% IV SOLN
2.0000 ug/min | INTRAVENOUS | Status: DC
  Filled 2019-06-13: qty 250

## 2019-06-13 MED ORDER — TRANEXAMIC ACID 1000 MG/10ML IV SOLN
1.5000 mg/kg/h | INTRAVENOUS | Status: AC
  Administered 2019-06-14: 1.5 mg/kg/h via INTRAVENOUS
  Filled 2019-06-13: qty 25

## 2019-06-13 MED ORDER — POTASSIUM CHLORIDE 2 MEQ/ML IV SOLN
80.0000 meq | INTRAVENOUS | Status: DC
  Filled 2019-06-13: qty 40

## 2019-06-13 MED ORDER — PLASMA-LYTE 148 IV SOLN
INTRAVENOUS | Status: AC
  Administered 2019-06-14: 500 mL
  Filled 2019-06-13: qty 2.5

## 2019-06-13 MED ORDER — TRANEXAMIC ACID (OHS) BOLUS VIA INFUSION
15.0000 mg/kg | INTRAVENOUS | Status: AC
  Administered 2019-06-14: 1186.5 mg via INTRAVENOUS
  Filled 2019-06-13: qty 1187

## 2019-06-13 MED ORDER — EPINEPHRINE HCL 5 MG/250ML IV SOLN IN NS
0.0000 ug/min | INTRAVENOUS | Status: DC
  Filled 2019-06-13: qty 250

## 2019-06-13 MED ORDER — SODIUM CHLORIDE 0.9 % IV SOLN
1.5000 g | INTRAVENOUS | Status: AC
  Administered 2019-06-14: 750 mg via INTRAVENOUS
  Administered 2019-06-14: 1.5 g via INTRAVENOUS
  Filled 2019-06-13: qty 1.5

## 2019-06-13 MED ORDER — DEXMEDETOMIDINE HCL IN NACL 400 MCG/100ML IV SOLN
0.1000 ug/kg/h | INTRAVENOUS | Status: AC
  Administered 2019-06-14: .7 ug/kg/h via INTRAVENOUS
  Filled 2019-06-13: qty 100

## 2019-06-13 MED ORDER — NOREPINEPHRINE 4 MG/250ML-% IV SOLN
0.0000 ug/min | INTRAVENOUS | Status: DC
  Filled 2019-06-13: qty 250

## 2019-06-13 MED ORDER — PHENYLEPHRINE HCL-NACL 20-0.9 MG/250ML-% IV SOLN
30.0000 ug/min | INTRAVENOUS | Status: AC
  Administered 2019-06-14: 15 ug/min via INTRAVENOUS
  Filled 2019-06-13: qty 250

## 2019-06-13 MED ORDER — GLUTARALDEHYDE 0.625% SOAKING SOLUTION
Freq: Once | TOPICAL | Status: DC | PRN
  Filled 2019-06-13: qty 50

## 2019-06-13 MED ORDER — VANCOMYCIN HCL 1250 MG/250ML IV SOLN
1250.0000 mg | INTRAVENOUS | Status: AC
  Administered 2019-06-14: 1500 mg via INTRAVENOUS
  Filled 2019-06-13: qty 250

## 2019-06-13 MED ORDER — SODIUM CHLORIDE 0.9 % IV SOLN
INTRAVENOUS | Status: AC
  Administered 2019-06-14: 27000 mL
  Filled 2019-06-13: qty 30

## 2019-06-13 MED ORDER — TRANEXAMIC ACID (OHS) PUMP PRIME SOLUTION
2.0000 mg/kg | INTRAVENOUS | Status: DC
  Filled 2019-06-13: qty 1.58

## 2019-06-13 MED ORDER — VANCOMYCIN HCL 1000 MG IV SOLR
INTRAVENOUS | Status: AC
  Administered 2019-06-14: 1000 mL
  Filled 2019-06-13: qty 1000

## 2019-06-13 NOTE — Anesthesia Preprocedure Evaluation (Addendum)
Anesthesia Evaluation  Patient identified by MRN, date of birth, ID band Patient awake    Reviewed: Allergy & Precautions, H&P , NPO status , Patient's Chart, lab work & pertinent test results  Airway Mallampati: II  TM Distance: >3 FB Neck ROM: Full    Dental no notable dental hx. (+) Teeth Intact, Dental Advisory Given   Pulmonary neg pulmonary ROS,    Pulmonary exam normal breath sounds clear to auscultation       Cardiovascular Exercise Tolerance: Good hypertension, + dysrhythmias + pacemaker + Valvular Problems/Murmurs MR  Rhythm:Regular Rate:Normal     Neuro/Psych negative neurological ROS  negative psych ROS   GI/Hepatic negative GI ROS, Neg liver ROS,   Endo/Other  negative endocrine ROS  Renal/GU negative Renal ROS  negative genitourinary   Musculoskeletal  (+) Arthritis , Osteoarthritis,    Abdominal   Peds  Hematology negative hematology ROS (+)   Anesthesia Other Findings   Reproductive/Obstetrics negative OB ROS                           Anesthesia Physical Anesthesia Plan  ASA: IV  Anesthesia Plan: General   Post-op Pain Management:    Induction: Intravenous  PONV Risk Score and Plan: 2 and Ondansetron, Dexamethasone and Midazolam  Airway Management Planned: Double Lumen EBT  Additional Equipment: Arterial line, CVP, PA Cath, TEE, 3D TEE and Ultrasound Guidance Line Placement  Intra-op Plan:   Post-operative Plan: Possible Post-op intubation/ventilation  Informed Consent: I have reviewed the patients History and Physical, chart, labs and discussed the procedure including the risks, benefits and alternatives for the proposed anesthesia with the patient or authorized representative who has indicated his/her understanding and acceptance.     Dental advisory given  Plan Discussed with: CRNA  Anesthesia Plan Comments: (PAT note written 06/13/2019 by Myra Gianotti, PA-C. )       Anesthesia Quick Evaluation

## 2019-06-13 NOTE — Patient Instructions (Signed)
   Have nothing to eat or drink after midnight the night before surgery.  On the morning of surgery do not take any medications.   

## 2019-06-13 NOTE — Progress Notes (Signed)
Anesthesia Chart Review:  Case: [id number] Date/Time: 06/14/19 0715   Procedures:      MINIMALLY INVASIVE MITRAL VALVE REPAIR (MVR)<TEE (Right Chest)     TRANSESOPHAGEAL ECHOCARDIOGRAM (TEE) (N/A )   Anesthesia type: General   Pre-op diagnosis: Mitral Regurgitaion   Location: [facility] OR ROOM 15 / [place] [facility]   Surgeons: [name], MD      DISCUSSION: Alexander [name] is an 79 year old male scheduled for the above procedure.  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   S/p 2nd [place] COVID-19 vaccine 04/11/19. 06/10/19 COVID-19 test negative. Anesthesia team to evaluate on the day of surgery.   VS: BP 138/74   Pulse 72   Temp 36.6 C (Oral)   Resp 17   Ht 5\' 10"  (1.778 m)   Wt 79.1 kg   SpO2 100%   BMI 25.02 kg/m    PROVIDERS: Alexander Infante, MD is PCP  Alexander Dawley, MD is primary cardiologist Alexander Lai, MD is EP cardiologist    LABS: Labs reviewed: Acceptable for surgery. (all labs ordered are listed, but only abnormal results are displayed)  Labs Reviewed  COMPREHENSIVE METABOLIC PANEL - Abnormal; Notable for the following components:      Result Value   Total Protein 6.3 (*)    All other components within normal limits  SURGICAL PCR SCREEN  APTT  BLOOD GAS, ARTERIAL  CBC  HEMOGLOBIN A1C  PROTIME-INR  URINALYSIS, ROUTINE W REFLEX MICROSCOPIC  TYPE AND SCREEN  ABO/RH     IMAGES: CXR 06/10/19: FINDINGS: Lungs are clear. Heart size and mediastinal contours are within normal limits. Stable left subclavian dual lead transvenous pacemaker. No effusion. Visualized bones unremarkable. IMPRESSION: No acute cardiopulmonary disease.   EKG: 06/06/19:  Normal sinus rhythm Right bundle branch block Abnormal ECG Compared to previous tracing the rate is slower Confirmed by Lyman Bishop [phone]) on 06/06/2019 6:17:28 PM   CV: Carotid US 06/10/19  (Preliminary): Summary:  - Right Carotid: Velocities in the right ICA are consistent with a 1-39%  stenosis.  - Left Carotid: Velocities in the left ICA are consistent with a 1-39%  stenosis.    Cardiac cath 06/06/19: 1. No angiographic evidence of CAD 2. Normal right and left sided pressures    TEE 05/23/19: IMPRESSIONS  1. 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  Reversal of forward flow is seen in the right sided  pulmonary veins. Mitral regurgitation is severe.  2. Left ventricular ejection fraction, by estimation, is 60 to 65%. The  left ventricle has normal function. The left ventricle has no regional  wall motion abnormalities. Left ventricular diastolic function could not  be evaluated.  3. Right ventricular systolic function is normal. The right ventricular  size is normal. There is normal pulmonary artery systolic pressure. The  estimated right ventricular systolic pressure is 0000000 mmHg.  4. Left atrial size was mildly dilated. No left atrial/left atrial  appendage thrombus was detected.  5. Right atrial size was mildly dilated.  6. The mitral valve is myxomatous. Severe mitral valve regurgitation. No  evidence of mitral stenosis.  7. The aortic valve is normal in structure. Aortic valve regurgitation is  not visualized. No aortic stenosis is present.  8. The inferior vena cava  is normal in size with greater than 50%  respiratory variability, suggesting right atrial pressure of 3 mmHg.  9. Agitated saline contrast bubble study was negative, with no evidence  of any interatrial shunt.     Past Medical History:  Diagnosis Date  . Heart murmur   . Hyperlipidemia   . Hypertension   . Mitral regurgitation   . MVP (mitral valve prolapse)   . Presence of permanent cardiac pacemaker   . Syncope      Past Surgical History:  Procedure Laterality Date  . BUBBLE STUDY  05/23/2019   Procedure: BUBBLE STUDY;  Surgeon: [name], MD;  Location: [facility];  Service: Cardiovascular;;  . HERNIA REPAIR    . PACEMAKER IMPLANT N/A 10/12/2017   Procedure: PACEMAKER IMPLANT;  Surgeon: Constance Haw, MD;  Location: [facility];  Service: Cardiovascular;  Laterality: N/A;  . RIGHT/LEFT HEART CATH AND CORONARY ANGIOGRAPHY N/A 06/06/2019   Procedure: RIGHT/LEFT HEART CATH AND CORONARY ANGIOGRAPHY;  Surgeon: Burnell Blanks, MD;  Location: [facility];  Service: Cardiovascular;  Laterality: N/A;  . TEE WITHOUT CARDIOVERSION N/A 05/23/2019   Procedure: TRANSESOPHAGEAL ECHOCARDIOGRAM (TEE);  Surgeon: [name], MD;  Location: [facility];  Service: Cardiovascular;  Laterality: N/A;  . TONSILECTOMY, ADENOIDECTOMY, BILATERAL MYRINGOTOMY AND TUBES     as child  . TONSILLECTOMY    . TOTAL KNEE ARTHROPLASTY Left   . TOTAL KNEE ARTHROPLASTY Left 02/23/2019   Procedure: TOTAL KNEE ARTHROPLASTY;  Surgeon: Gaynelle Arabian, MD;  Location: [facility];  Service: Orthopedics;  Laterality: Left;  19min    MEDICATIONS: . acetaminophen (TYLENOL) 500 MG tablet  . amoxicillin (AMOXIL) 500 MG capsule  . Ascorbic Acid (VITAMIN C) 1000 MG tablet  . aspirin EC 81 MG tablet  . chlorhexidine (PERIDEX) 0.12 % solution  . Cholecalciferol (VITAMIN D) 50 MCG (2000 UT) tablet  . Multiple Vitamin (MULTIVITAMIN WITH MINERALS) TABS tablet  . zolpidem (AMBIEN) 10 MG tablet   No current facility-administered medications for this encounter.   Derrill Memo ON 06/14/2019] cefUROXime (ZINACEF) 1.5 g in sodium chloride 0.9 % 100 mL IVPB  . [START ON 06/14/2019] cefUROXime (ZINACEF) 750 mg in sodium chloride 0.9 % 100 mL IVPB  . [START ON 06/14/2019] dexmedetomidine (PRECEDEX) 400 MCG/100ML (4 mcg/mL) infusion  . [START ON 06/14/2019] EPINEPHrine (ADRENALIN) 4 mg in NS 250 mL (0.016 mg/mL) premix  infusion  . [START ON 06/14/2019] glutaraldehyde 0.625% cardiac soaking solution  . [START ON 06/14/2019] heparin 2,500 Units, papaverine 30 mg in electrolyte-148 (PLASMALYTE-148) 500 mL irrigation  . [START ON 06/14/2019] heparin 30,000 units/NS 1000 mL solution for CELLSAVER  . [START ON 06/14/2019] insulin regular, human (MYXREDLIN) 100 units/ 100 mL infusion  . [START ON 06/14/2019] [place] Blood Cardioplegia vial (lidocaine/magnesium/mannitol 0.26g-4g-6.4g)  . [START ON 06/14/2019] milrinone (PRIMACOR) 20 MG/100 ML (0.2 mg/mL) infusion  . [START ON 06/14/2019] nitroGLYCERIN 50 mg in dextrose 5 % 250 mL (0.2 mg/mL) infusion  . [START ON 06/14/2019] norepinephrine (LEVOPHED) 4mg  in 286mL premix infusion  . [START ON 06/14/2019] phenylephrine (NEOSYNEPHRINE) 20-0.9 MG/250ML-% infusion  . [START ON 06/14/2019] potassium chloride injection 80 mEq  . [START ON 06/14/2019] tranexamic acid (CYKLOKAPRON) 2,500 mg in sodium chloride 0.9 % 250 mL (10 mg/mL) infusion  . [START ON 06/14/2019] tranexamic acid (CYKLOKAPRON) bolus via infusion - over 30 minutes 1,186.5 mg  . [START ON 06/14/2019] tranexamic acid (CYKLOKAPRON) pump prime solution 158 mg  . [START ON 06/14/2019] vancomycin (VANCOCIN) 1,000 mg  in sodium chloride 0.9 % 1,000 mL irrigation  . [START ON 06/14/2019] vancomycin (VANCOREADY) IVPB 1250 mg/250 mL  Amoxicillin and chlorhexidene solution completed 06/06/19.    Myra Gianotti, PA-C Surgical Short Stay/Anesthesiology [facility] Phone [phone] [facility] Phone [phone] 06/13/2019 10:24 AM

## 2019-06-13 NOTE — Progress Notes (Signed)
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  Patient was initially seen in consultation on May 26, 2019.  Since then he underwent diagnostic cardiac catheterization on June 06, 2019.  He was found to have normal coronary artery anatomy with no significant coronary artery disease.  Right heart pressures were normal.  CT angiography was performed to evaluate the feasibility of peripheral cannulation for surgery.  The patient reports no new problems or complaints over the past 2 weeks and is eager to proceed with surgery tomorrow as previously planned   Current Outpatient Medications  Medication Sig Dispense Refill  . acetaminophen (TYLENOL) 500 MG tablet Take 500-1,000 mg by mouth every 6 (six) hours as needed for mild pain.    Marland Kitchen zolpidem (AMBIEN) 10 MG tablet TAKE ONE TABLET AT BEDTIME AS NEEDED (Patient taking differently: Take 10 mg by mouth at bedtime as needed for sleep. ) 90 tablet 0  . Ascorbic Acid (VITAMIN C) 1000 MG tablet Take 1,000 mg by mouth daily.    Marland Kitchen aspirin EC 81 MG tablet Take 1 tablet (81 mg total) by mouth daily. (Patient not taking: Reported on 06/13/2019) 90 tablet 3  . Cholecalciferol (VITAMIN D) 50 MCG (2000 UT) tablet Take 2,000 Units by mouth daily.    . Multiple Vitamin (MULTIVITAMIN WITH MINERALS) TABS tablet Take 1 tablet by mouth daily.     No current facility-administered medications for  this visit.   Facility-Administered Medications Ordered in Other Visits  Medication Dose Route Frequency Provider Last Rate Last Admin  . [START ON 06/14/2019] cefUROXime (ZINACEF) 1.5 g in sodium chloride 0.9 % 100 mL IVPB  1.5 g Intravenous To OR Alexander Alberts, MD      . Derrill Memo ON 06/14/2019] cefUROXime (ZINACEF) 750 mg in sodium chloride 0.9 % 100 mL IVPB  750 mg Intravenous To OR Alexander Alberts, MD      . Derrill Memo ON 06/14/2019] dexmedetomidine (PRECEDEX) 400 MCG/100ML (4 mcg/mL) infusion  0.1-0.7 mcg/kg/hr Intravenous To OR Alexander Alberts, MD      . Derrill Memo ON 06/14/2019] EPINEPHrine (ADRENALIN) 4 mg in NS 250 mL (0.016 mg/mL) premix infusion  0-10 mcg/min Intravenous To OR Alexander Alberts, MD      . Derrill Memo ON 06/14/2019] glutaraldehyde 0.625% cardiac soaking solution   Topical Once PRN Alexander Alberts, MD      . Derrill Memo ON 06/14/2019] heparin 2,500 Units, papaverine 30 mg in electrolyte-148 (PLASMALYTE-148) 500 mL irrigation   Irrigation To OR Alexander Alberts, MD      . Derrill Memo ON 06/14/2019] heparin 30,000 units/NS 1000 mL solution for CELLSAVER   Other To OR Alexander Alberts, MD      . Derrill Memo ON 06/14/2019] insulin regular, human (MYXREDLIN) 100 units/ 100 mL infusion   Intravenous To OR Alexander Alberts, MD      . Derrill Memo ON 06/14/2019] [place] Blood Cardioplegia  vial (lidocaine/magnesium/mannitol 0.26g-4g-6.4g)   Intracoronary Once Alexander Alberts, MD      . Derrill Memo ON 06/14/2019] milrinone (PRIMACOR) 20 MG/100 ML (0.2 mg/mL) infusion  0.3 mcg/kg/min Intravenous To OR Alexander Alberts, MD      . Derrill Memo ON 06/14/2019] nitroGLYCERIN 50 mg in dextrose 5 % 250 mL (0.2 mg/mL) infusion  2-200 mcg/min Intravenous To OR Alexander Alberts, MD      . Derrill Memo ON 06/14/2019] norepinephrine (LEVOPHED) 4mg  in 266mL premix infusion  0-40 mcg/min Intravenous To OR Alexander Alberts, MD      . Derrill Memo ON 06/14/2019] phenylephrine (NEOSYNEPHRINE) 20-0.9 MG/250ML-% infusion  30-200 mcg/min Intravenous To OR Alexander Alberts, MD       . Derrill Memo ON 06/14/2019] potassium chloride injection 80 mEq  80 mEq Other To OR Alexander Alberts, MD      . Derrill Memo ON 06/14/2019] tranexamic acid (CYKLOKAPRON) 2,500 mg in sodium chloride 0.9 % 250 mL (10 mg/mL) infusion  1.5 mg/kg/hr Intravenous To OR Alexander Alberts, MD      . Derrill Memo ON 06/14/2019] tranexamic acid (CYKLOKAPRON) bolus via infusion - over 30 minutes 1,186.5 mg  15 mg/kg Intravenous To OR Alexander Alberts, MD      . Derrill Memo ON 06/14/2019] tranexamic acid (CYKLOKAPRON) pump prime solution 158 mg  2 mg/kg Intracatheter To OR Alexander Alberts, MD      . Derrill Memo ON 06/14/2019] vancomycin (VANCOCIN) 1,000 mg in sodium chloride 0.9 % 1,000 mL irrigation   Irrigation To OR Alexander Alberts, MD      . Derrill Memo ON 06/14/2019] vancomycin (VANCOREADY) IVPB 1250 mg/250 mL  1,250 mg Intravenous To OR Alexander Alberts, MD          Physical Exam:   BP (!) 143/68 (BP Location: Right Arm, Patient Position: Sitting, Cuff Size: Normal)   Pulse 74   Temp (!) 97.3 F (36.3 C)   Resp 16   Ht 5\' 10"  (1.778 m)   Wt 174 lb (78.9 kg)   SpO2 98% Comment: RA  BMI 24.97 kg/m   General:  Well-appearing  Chest:   Clear to auscultation  CV:   Regular rate and rhythm with systolic murmur  Incisions:  n/a  Abdomen:  Soft nontender  Extremities:  Warm and well-perfused  Diagnostic Tests:  RIGHT/LEFT HEART CATH AND CORONARY ANGIOGRAPHY  Conclusion  1. No angiographic evidence of CAD 2. Normal right and left sided pressures  Continue with planning for mitral valve surgery.   Surgeon Notes    05/23/2019 9:38 AM CV Procedure signed by Alexander Spark, MD  Indications  Severe mitral regurgitation [I34.0 (ICD-10-CM)]  Procedural Details  Technical Details Indication: 79 yo male with severe mitral regurgitation, planning underway for mitral valve repair by Dr. Roxy [name].   Procedure: The risks, benefits, complications, treatment options, and expected outcomes were discussed with the patient. The patient  and/or family concurred with the proposed plan, giving informed consent. The patient was brought to the cath lab after IV hydration was given. The patient was sedated with Versed and Fentanyl. The IV catheter present in the right antecubital vein was changed for a 5 [place] sheath. Right heart catheterization performed with a balloon tipped catheter. The right wrist was prepped and draped in a sterile fashion. 1% lidocaine was used for local anesthesia. Using the modified Seldinger access technique, a 5 French sheath was placed in the right radial artery. 3 mg Verapamil was given through the sheath. 4000 units IV  heparin was given. Standard diagnostic catheters were used to perform selective coronary angiography. A JR4 was used to cross the aortic valve. LV pressures measured. No LV gram. The sheath was removed from the right radial artery and a Terumo hemostasis band was applied at the arteriotomy site on the right wrist.    Estimated blood loss <50 mL.   During this procedure medications were administered to achieve and maintain moderate conscious sedation while the patient's heart rate, blood pressure, and oxygen saturation were continuously monitored and I was present face-to-face 100% of this time.  Medications (Filter: Administrations occurring from 06/06/19 0717 to 06/06/19 0817) fentaNYL (SUBLIMAZE) injection (mcg) Total dose:  50 mcg Date/Time  Rate/Dose/Volume Action  06/06/19 0745  25 mcg Given  0749  25 mcg Given    midazolam (VERSED) injection (mg) Total dose:  2 mg Date/Time  Rate/Dose/Volume Action  06/06/19 0745  1 mg Given  0749  1 mg Given    lidocaine (PF) (XYLOCAINE) 1 % injection (mL) Total volume:  2 mL Date/Time  Rate/Dose/Volume Action  06/06/19 0750  2 mL Given    Radial Cocktail/Verapamil only (mL) Total volume:  10 mL Date/Time  Rate/Dose/Volume Action  06/06/19 0751  10 mL Given    heparin injection (Units) Total dose:  4,000 Units Date/Time   Rate/Dose/Volume Action  06/06/19 0759  4,000 Units Given    iohexol (OMNIPAQUE) 350 MG/ML injection (mL) Total volume:  55 mL Date/Time  Rate/Dose/Volume Action  06/06/19 0811  55 mL Given    Heparin (Porcine) in NaCl 1000-0.9 UT/500ML-% SOLN (mL) Total volume:  1,000 mL Date/Time  Rate/Dose/Volume Action  06/06/19 0811  500 mL Given  0812  500 mL Given    Sedation Time  Sedation Time Physician-1: 21 minutes 51 seconds  Contrast  Medication Name Total Dose  iohexol (OMNIPAQUE) 350 MG/ML injection 55 mL    Radiation/Fluoro  Fluoro time: 4.1 (min) DAP: 15479 (mGycm2) Cumulative Air Kerma: 0000000 (mGy)  Complications  Complications documented before study signed (06/06/2019 9:49 AM)   RIGHT/LEFT HEART CATH AND CORONARY ANGIOGRAPHY  None Documented by Burnell Blanks, MD 06/06/2019 8:17 AM  Date Found: 06/06/2019  Time Range: Intraprocedure      Coronary Findings  Diagnostic Dominance: Right Left Anterior Descending  Vessel is large.  Left Circumflex  Vessel is large.  Right Coronary Artery  Vessel is large.  Intervention  No interventions have been documented. Right Heart  Right Heart Pressures LV EDP is normal.  Coronary Diagrams  Diagnostic Dominance: Right  Intervention  Implants   No implant documentation for this case.  Syngo Images  Show images for CARDIAC CATHETERIZATION  Images on Long Term Storage  Show images for Alexander [name], Alexander A "ROD"   Link to Procedure Log  Procedure Log    Hemo Data   Most Recent Value  Fick Cardiac Output 6.47 L/min  Fick Cardiac Output Index 3.33 (L/min)/BSA  RA A Wave 6 mmHg  RA V Wave 3 mmHg  RA Mean 1 mmHg  RV Systolic Pressure 28 mmHg  RV Diastolic Pressure 1 mmHg  RV EDP 6 mmHg  PA Systolic Pressure 29 mmHg  PA Diastolic Pressure 3 mmHg  PA Mean 14 mmHg  PW A Wave 11 mmHg  PW V Wave 14 mmHg  PW Mean 7 mmHg  AO Systolic Pressure [id number] mmHg  AO Diastolic Pressure 58 mmHg  AO Mean 81  mmHg  LV Systolic Pressure 0000000 mmHg  LV Diastolic Pressure 2 mmHg  LV EDP 8 mmHg  AOp Systolic Pressure [id number] mmHg  AOp Diastolic Pressure 56 mmHg  AOp Mean Pressure 80 mmHg  LVp Systolic Pressure [id number] mmHg  LVp Diastolic Pressure 2 mmHg  LVp EDP Pressure 9 mmHg  QP/QS 1  TPVR Index 4.21 HRUI  TSVR Index 24.38 HRUI  PVR SVR Ratio 0.09  TPVR/TSVR Ratio 0.17      CT ANGIOGRAPHY CHEST, ABDOMEN AND PELVIS  TECHNIQUE: Non-contrast CT of the chest was initially obtained.  Multidetector CT imaging through the chest, abdomen and pelvis was performed using the standard protocol during bolus administration of intravenous contrast. Multiplanar reconstructed images and MIPs were obtained and reviewed to evaluate the vascular anatomy.  CONTRAST:  92mL ISOVUE-370 IOPAMIDOL (ISOVUE-370) INJECTION 76%  COMPARISON:  Prior CTA chest 03/14/2019 and CT chest 05/03/2018.  FINDINGS: CTA CHEST FINDINGS  Cardiovascular: Conventional 3 vessel arch anatomy. Elongation of the aortic isthmus and proximal descending thoracic aorta resulting a type 3 arch. Mild ectasia of the tubular portion of the ascending thoracic aorta with a maximal transverse diameter of 3.7 cm. No evidence of dissection. The aortic root, transverse aorta and descending thoracic aorta are all normal in caliber. There is scant atherosclerotic plaque. Mild left ventricular dilatation without overt cardiomegaly. Left subclavian approach cardiac rhythm maintenance device with leads terminating in the right atrium and right ventricular apex. No pericardial effusion.  Mediastinum/Nodes: Unremarkable CT appearance of the thyroid gland. No suspicious mediastinal or hilar adenopathy. No soft tissue mediastinal mass. The thoracic esophagus is unremarkable.  Lungs/Pleura: Biapical pleuroparenchymal scarring. A 4 mm pulmonary nodule in the right lower lobe (image 74, series 15) is new compared to prior imaging from January.  Stable 5 mm pulmonary nodule which is likely partially calcified in the periphery of the left lower lobe (image 131, series 15). No focal airspace consolidation, pleural effusion, pulmonary edema or pneumothorax.  Musculoskeletal: No acute fracture or aggressive appearing lytic or blastic osseous lesion.  Review of the MIP images confirms the above findings.  CTA ABDOMEN AND PELVIS FINDINGS  VASCULAR  Aorta: Normal caliber abdominal aorta. Mild calcified atherosclerotic plaque. No dissection or penetrating ulceration.  Celiac: Patent without evidence of aneurysm, dissection, vasculitis or significant stenosis. The left hepatic artery is replaced to the left gastric artery.  SMA: Patent without evidence of aneurysm, dissection, vasculitis or significant stenosis.  Renals: 2 renal arteries are present bilaterally. On the right, the accessory lower pole renal artery arises from the distal most aorta at the bifurcation. Minimal plaque at the origins without significant stenosis.  IMA: Patent without evidence of aneurysm, dissection, vasculitis or significant stenosis.  Inflow: Mild atherosclerotic plaque. Widely patent. The iliac arteries are mildly tortuous. The common iliac arteries are ectatic bilaterally but not aneurysmal. The arteries measure up to 1.4 cm bilaterally.  Veins: No focal venous abnormality.  Review of the MIP images confirms the above findings.  NON-VASCULAR  Hepatobiliary: No focal liver abnormality is seen. No gallstones, gallbladder wall thickening, or biliary dilatation.  Pancreas: Unremarkable. No pancreatic ductal dilatation or surrounding inflammatory changes.  Spleen: Normal in size without focal abnormality.  Adrenals/Urinary Tract: Adrenal glands are unremarkable. Kidneys are normal, without renal calculi, focal lesion, or hydronephrosis. Bladder is unremarkable.  Stomach/Bowel: Congenital malrotation. The duodenum  does not cross the midline and the entirety of the colon is located in the left hemiabdomen including the cecum and appendix. No focal bowel wall thickening or evidence of obstruction.  Lymphatic: No suspicious lymphadenopathy.  Reproductive: Prostatomegaly.  Other:  Small fat containing umbilical hernia.  Musculoskeletal: No acute fracture or aggressive appearing lytic or blastic osseous lesion.  Review of the MIP images confirms the above findings.  IMPRESSION: VASCULAR  1. Fairly mild atherosclerotic plaque without evidence of significant stenosis, occlusion, dissection, aneurysm or other acute arterial abnormality. Aortic Atherosclerosis (ICD10-170.0) 2. Mild left ventricular dilatation history of mitral regurgitation. 3. Bilateral accessory renal arteries. 4. 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 5. Mild ectasia and tortuosity of the bilateral iliac arteries.  NON VASCULAR  1. 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 The duodenum does not cross the midline and the entirety of the colon lies in the left hemiabdomen including the cecum and appendix. 2. Small 4 mm pulmonary nodule in the right lower lobe is new compared to relatively recent prior imaging from January. This almost certainly represents a small focus of active inflammation/infection. No further follow-up required unless the patient is at high risk for pulmonary malignancy. 3. Stable 5 mm left lower lobe benign pulmonary nodule. 4. Small fat containing umbilical hernia.  Signed,  Criselda Peaches, MD, [place]  [facility]  [facility]   Electronically Signed   By: Jacqulynn Cadet M.D.   On: 06/13/2019 15:12    Impression:  Patient has mitral valve prolapse with stage D severe symptomatic primary mitral regurgitation.  He describes relatively recent onset of exertional shortness of breath that  occurs only with more strenuous physical exertion, consistent with chronic diastolic congestive heart failure, New [place] Heart Association functional class I-II.  I have personally reviewed the patient's recent transthoracic and transesophageal echocardiograms, diagnostic cardiac catheterization, and CT angiograms.  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  There are multiple elongated primary chordae tendinae.  There do not appear to be any ruptured primary chords nor flail segments.  There are multiple jets of regurgitation with obvious flow reversal in the pulmonary veins consistent with severe mitral regurgitation.  There is normal left ventricular systolic function.  There is mild left atrial chamber enlargement.  The aortic valve appears normal.  Right ventricular size and function appear normal.  There is no significant tricuspid regurgitation.    Diagnostic cardiac catheterization reveals normal coronary artery anatomy with no significant coronary artery disease.  Right heart pressures are normal.  I agree the patient would benefit from elective mitral valve repair.  Risks associated with surgery should be low and elevated only by the patient's age and reported history of mild short-term memory dysfunction.  Based upon review of the patient's TEE I feel there is a greater than 95% likelihood of successful and durable mitral valve repair.  In the absence of significant coronary artery disease the patient appears to be a good candidate for minimally invasive approach for surgery.   Plan:  The patient and his wife were  again counseled at length regarding the indications, risks and potential benefits of mitral valve repair.  The rationale for elective surgery has been explained, including a comparison between surgery and continued medical therapy with close follow-up.  The  likelihood of successful and durable mitral valve repair has been discussed with particular reference to the findings of their recent echocardiogram.  Based upon these findings and previous experience, I have quoted them a greater than 95 percent likelihood of successful valve repair with less than 1 percent risk of mortality or major morbidity.  Alternative surgical approaches have been discussed including  a comparison between conventional sternotomy and minimally-invasive techniques.  The relative risks and benefits of each have been reviewed as they pertain to the patient's specific circumstances, and expectations for the patient's postoperative convalescence has been discussed.    Concerns related to the patient's mild baseline cognitive deficits and the associated potential for postoperative risk of stroke, further decline in cognition, and/or postoperative delirium were discussed.  The patient understands and accepts all potential risks of surgery including but not limited to risk of death, stroke or other neurologic complication, myocardial infarction, congestive heart failure, respiratory failure, renal failure, bleeding requiring transfusion and/or reexploration, arrhythmia, infection or other wound complications, pneumonia, pleural and/or pericardial effusion, pulmonary embolus, aortic dissection or other major vascular complication, or delayed complications related to valve repair or replacement including but not limited to structural valve deterioration and failure, thrombosis, embolization, endocarditis, or paravalvular leak.  Specific risks potentially related to the minimally-invasive approach were discussed at length, including but not limited to risk of conversion to full or partial sternotomy, aortic dissection or other major vascular complication, unilateral acute lung injury or pulmonary edema, phrenic nerve dysfunction or paralysis, rib fracture, chronic pain, lung hernia, or lymphocele. All  of their questions have been answered.   I spent in excess of 15 minutes during the conduct of this office consultation and >50% of this time involved direct face-to-face encounter with the patient for counseling and/or coordination of their care.    Valentina Gu. Alexander Manns, MD 06/13/2019 3:53 PM

## 2019-06-13 NOTE — H&P (Signed)
[name]       [place] [address]             [phone]          CARDIOTHORACIC SURGERY HISTORY AND PHYSICAL EXAM  Referring Provider is [name], MD PCP is [name], MD  Chief Complaint  Patient presents with  . 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  Patient states that he has known of a presence of a heart murmur and mitral valve prolapse for many years.  For a long time he was followed by Dr. Mare Ferrari, and more recently he has been followed by Dr. [name].  He has remained physically very active all of his adult life and exercises on a regular basis.  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  He underwent permanent pacemaker placement and has done well ever since.  Over the last several months the patient has developed new onset of exertional shortness of breath.  Symptoms only occur with more strenuous exertion, although the patient had 1 or 2 episodes of severe shortness of breath following strenuous exertion that was slow to recover.  Despite this he has still remained active and he continues to exercise on a regular basis.  He was seen in follow-up recently by Dr. [name] and noted to have a more prominent systolic murmur on physical exam.  Follow-up transthoracic echocardiogram revealed normal left ventricular systolic function with at least moderate mitral regurgitation.  TEE was performed May 23, 2019 confirmed the presence of  myxomatous degenerative disease with bileaflet prolapse and severe mitral regurgitation.  There were 3 distinct jets of regurgitation and flow reversal noted in the right-sided pulmonary veins.  There remain normal left ventricular systolic function.  There was mild left atrial enlargement.  No other significant abnormalities were noted.  Cardiothoracic surgical consultation was requested.  Patient is married and lives locally in [place] with his wife.  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  He remains quite active physically.  He enjoys golf and exercises on a regular basis.  He reports several episodes of exertional shortness of breath that have developed only following more strenuous physical exertion.  He denies shortness of breath with ordinary level activity or at rest.  He has not had PND, orthopnea, nor lower extremity edema.  He has not had tachypalpitations, dizzy spells, nor syncope.  He does admit to the development of mild short-term memory loss over the last year or so.  This has been described as annoying but not problematic from a practical standpoint and the patient remains entirely functionally independent.   Past Medical History:  Diagnosis Date  . Heart murmur   . Hyperlipidemia   . Hypertension   . Mitral regurgitation   . MVP (mitral valve prolapse)   . Presence of permanent cardiac pacemaker   . Syncope     Past Surgical History:  Procedure Laterality Date  . BUBBLE STUDY  05/23/2019   Procedure: BUBBLE STUDY;  Surgeon: [name]  H, MD;  Location: [facility];  Service: Cardiovascular;;  . HERNIA REPAIR    . PACEMAKER IMPLANT N/A 10/12/2017   Procedure: PACEMAKER IMPLANT;  Surgeon: Constance Haw, MD;  Location: [facility];  Service: Cardiovascular;  Laterality: N/A;  . RIGHT/LEFT HEART CATH AND CORONARY ANGIOGRAPHY N/A 06/06/2019   Procedure: RIGHT/LEFT HEART CATH AND CORONARY ANGIOGRAPHY;  Surgeon: Burnell Blanks, MD;  Location: [facility];  Service: Cardiovascular;  Laterality: N/A;  . TEE WITHOUT CARDIOVERSION N/A 05/23/2019   Procedure: TRANSESOPHAGEAL ECHOCARDIOGRAM (TEE);  Surgeon: [name], MD;  Location: [facility];  Service: Cardiovascular;  Laterality: N/A;  . TONSILECTOMY, ADENOIDECTOMY, BILATERAL MYRINGOTOMY AND TUBES     as child  . TONSILLECTOMY    . TOTAL KNEE ARTHROPLASTY Left   . TOTAL KNEE ARTHROPLASTY Left 02/23/2019   Procedure: TOTAL KNEE ARTHROPLASTY;  Surgeon: Gaynelle Arabian, MD;  Location: [facility];  Service: Orthopedics;  Laterality: Left;  84min    Family History  Problem Relation Age of Onset  . Alzheimer's disease Mother   . Hypertension Father     Social History Social History   Tobacco Use  . Smoking status: Never Smoker  . Smokeless tobacco: Never Used  Substance Use Topics  . Alcohol use: Yes    Alcohol/week: 5.0 standard drinks    Types: 5 Glasses of wine per week  . Drug use: No    Prior to Admission medications   Medication Sig Start Date End Date Taking? Authorizing Provider  acetaminophen (TYLENOL) 500 MG tablet Take 500-1,000 mg by mouth every 6 (six) hours as needed for mild pain.    [provider]  Ascorbic Acid (VITAMIN C) 1000 MG tablet Take 1,000 mg by mouth daily.    [provider]  aspirin EC 81 MG tablet Take 1 tablet (81 mg total) by mouth daily. Patient not taking: Reported on 06/13/2019 04/20/19   [name], MD  Cholecalciferol (VITAMIN D) 50 MCG (2000 UT) tablet Take 2,000 Units by mouth daily.    [provider]  Multiple Vitamin (MULTIVITAMIN WITH MINERALS) TABS tablet Take 1 tablet by mouth daily.    [provider]  zolpidem (AMBIEN) 10 MG tablet TAKE ONE TABLET AT BEDTIME AS NEEDED Patient taking differently: Take 10 mg by mouth at bedtime as needed for sleep.  05/02/15   Darlin Coco, MD    No Known Allergies   Review of Systems:               General:                      normal appetite, normal energy, no weight gain, no weight loss, no fever             Cardiac:                       no chest pain with exertion, no chest pain at rest, +SOB with more strenuous exertion, no resting SOB, no PND, no orthopnea, no palpitations, no arrhythmia, no atrial fibrillation, no LE edema, no dizzy spells, no syncope             Respiratory:                 no shortness of breath, no home oxygen, no productive cough, no dry cough, no bronchitis, no wheezing, no hemoptysis, no asthma, no pain with inspiration or cough, no sleep apnea,  no CPAP at night             GI:                               no difficulty swallowing, no reflux, no frequent heartburn, no hiatal hernia, no abdominal pain, no constipation, no diarrhea, no hematochezia, no hematemesis, no melena             GU:                              no dysuria,  no frequency, no urinary tract infection, no hematuria, no enlarged prostate, no kidney stones, no kidney disease             Vascular:                     no pain suggestive of claudication, no pain in feet, no leg cramps, no varicose veins, no DVT, no non-healing foot ulcer             Neuro:                         no stroke, no TIA's, no seizures, no headaches, no temporary blindness one eye,  no slurred speech, no peripheral neuropathy, no chronic pain, no instability of gait, + memory/cognitive dysfunction             Musculoskeletal:         no arthritis, no joint swelling, no myalgias, no difficulty walking, normal mobility              Skin:                            no rash, no itching, no skin infections, no pressure sores or ulcerations             Psych:                         no anxiety, no depression, no nervousness, no unusual recent stress             Eyes:                           no blurry vision, no floaters, no recent vision changes, + wears glasses only for reading             ENT:                            no  hearing loss, no loose or painful teeth, no dentures, last saw dentist yesterday for tooth extraction             Hematologic:               no easy bruising, no abnormal bleeding, no clotting disorder, no frequent epistaxis             Endocrine:                   no diabetes, does not check CBG's at home  Physical Exam:              BP 133/85 (BP Location: Left Arm, Patient Position: Sitting, Cuff Size: Normal)   Pulse 69   Temp (!) 97.3 F (36.3 C)   Resp 16   Ht 5' 9.5" (1.765 m)   Wt 170 lb (77.1 kg)   SpO2 98% Comment: RA  BMI 24.74 kg/m              General:                      Thin,  well-appearing             HEENT:                       Unremarkable              Neck:                           no JVD, no bruits, no adenopathy              Chest:                          clear to auscultation, symmetrical breath sounds, no wheezes, no rhonchi              CV:                              RRR, grade IV/VI holosystolic murmur              Abdomen:                    soft, non-tender, no masses              Extremities:                 warm, well-perfused, pulses palpable, no LE edema             Rectal/GU                   Deferred             Neuro:                         Grossly non-focal and symmetrical throughout             Skin:                            Clean and dry, no rashes, no breakdown   Diagnostic Tests:  ECHOCARDIOGRAM LIMITED REPORT       Patient Name:  Deatra Canter Date of Exam: 03/14/2019  Medical Rec #: [id number]     Height:    69.0 in  Accession #:  [id number]     Weight:    165.0 lb  Date of Birth: 1940-12-08     BSA:     1.90 m  Patient Age:  7 years      BP:      122/50 mmHg  Patient Gender: M         HR:      71 bpm.  Exam Location: [facility]     Procedure: Limited Echo,  Cardiac Doppler and Color Doppler   Indications:  R06.02 Shortness  of breath. I34.1 MVP. LIMITED to evaluate  MVP.    History:    Patient has prior history of Echocardiogram examinations,  most         recent 02/10/2019.    Sonographer:  Jessee Avers, RDCS  Referring Phys: Candee Furbish MD   IMPRESSIONS    1. Left ventricular ejection fraction, by visual estimation, is 55 to  60%. There is no increased left ventricular wall thickness.  2. Left ventricular diastolic parameters are consistent with Grade I  diastolic dysfunction (impaired relaxation).  3. Mild to moderately dilated left ventricular internal cavity size.  4. Global right ventricle has normal systolc function.The right  ventricular size is normal. no increase in right ventricular wall  thickness.  5. Moderate mitral valve prolapse.  6. Moderate thickening of the mitral valve leaflet(s).  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  8. Normal pulmonary artery systolic pressure.  9. The inferior vena cava is normal in size with greater than 50%  respiratory variability, suggesting right atrial pressure of 3 mmHg.  10. A prior study was performed on 02/10/2019.  11. No significant change from prior study.  12. Prior echo: LVEF 55-60%, moderate MR with MVP.   FINDINGS  Left Ventricle: Left ventricular ejection fraction, by visual estimation,  is 55 to 60%. The left ventricular internal cavity size was mildly to  moderately dilated left ventricle. There is no increased left ventricular  wall thickness. Left ventricular  diastolic parameters are consistent with Grade I diastolic dysfunction  (impaired relaxation). Indeterminate filling pressures.   Right Ventricle: The right ventricular size is normal. No increase in  right ventricular wall thickness. Global RV systolic function is has  normal systolic function. The tricuspid regurgitant velocity is 2.34 m/s,  and with an assumed right atrial pressure  of 3 mmHg, the estimated  right ventricular systolic pressure is normal at  25.0 mmHg.   Mitral Valve: The mitral valve is myxomatous. There is moderate late  systolic prolapse of of the mitral valve. There is moderate thickening of  the mitral valve leaflet(s). MV Area by PHT, 2.48 cm. MV PHT, 88.74 msec.  Moderate mitral valve regurgitation,  with eccentric posteriorly directed jet.   Venous: The inferior vena cava is normal in size with greater than 50%  respiratory variability, suggesting right atrial pressure of 3 mmHg.   Additional Comments: A prior study was performed on 02/10/2019.    LEFT VENTRICLE     Normals  PLAX 2D  LVIDd:     6.30 cm 3.6 cm  Diastology         Normals  LVIDs:     4.10 cm 1.7 cm  LV e' lateral:  9.14 cm/s 6.42 cm/s  LV PW:     0.90 cm 1.4 cm  LV E/e' lateral: 8.3    15.4  LV IVS:    0.90 cm 1.3 cm  LV e' medial:  5.44 cm/s 6.96 cm/s  LV SV:     127 ml 79 ml  LV E/e' medial: 14.0   6.96  LV SV Index:  66.27  45 ml/m2     RIGHT VENTRICLE  RVSP:      25.0 mmHg   LEFT ATRIUM     Index   RIGHT ATRIUM  LA diam:  4.20 cm 2.21 cm/m RA Pressure: 3.00 mmHg    AORTA  Normals  Ao Root diam: 3.70 cm 31 mm   MITRAL VALVE      Normals   TRICUSPID VALVE       Normals                   TR Peak grad:  22.0 mmHg             55 ms    TR Vmax:    236.00 cm/s 288 cm/s  MV Decel Time: 306 msec 187 ms   Estimated RAP: 3.00 mmHg  MV E velocity: 76.30 cm/s 103 cm/s RVSP:      25.0 mmHg  MV A velocity: 70.70 cm/s 70.3 cm/s  MV E/A ratio: 1.08    1.5     Lyman Bishop MD  Electronically signed by Lyman Bishop MD  Signature Date/Time: 03/14/2019/4:15:55 PMThe mitral valve is myxomatous.       TRANSESOPHOGEAL ECHO REPORT       Patient Name:  MAAN RYMER Date of Exam: 05/23/2019  Medical Rec #: [id number]     Height:     70.0 in  Accession #:  [id number]     Weight:    170.0 lb  Date of Birth: 07/24/1940     BSA:     1.948 m  Patient Age:  71 years      BP:      120/76 mmHg  Patient Gender: M         HR:      71 bpm.  Exam Location: Inpatient   Procedure: Transesophageal Echo, 3D Echo, Color Doppler, Cardiac Doppler  and       Saline Contrast Bubble Study   Indications:   I34.0 Nonrheumatic mitral (valve) insufficiency    History:     Patient has prior history of Echocardiogram examinations,  most          recent 03/14/2019. Arrythmias:RBBB; Risk  Factors:Dyslipidemia and          Hypertension. Mitral Valve Prolapse.    Sonographer:   Raquel Sarna Senior RDCS  Referring Phys: [id number] [place]  Diagnosing Phys: [name] MD   PROCEDURE: After discussion of the risks and benefits of a TEE, an  informed consent was obtained from the patient. The transesophogeal probe  was passed without difficulty through the esophogus of the patient.  Sedation performed by different physician.  The patient was monitored while under deep sedation. Anesthestetic  sedation was provided intravenously by Anesthesiology: 206mg  of Propofol.  The patient's vital signs; including heart rate, blood pressure, and  oxygen saturation; remained stable  throughout the procedure. The patient developed no complications during  the procedure.   IMPRESSIONS    1. 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  Reversal of forward flow is seen in the right sided  pulmonary veins. Mitral regurgitation is severe.  2. Left ventricular ejection fraction, by estimation, is 60 to 65%. The  left ventricle has normal function. The left ventricle has no regional  wall motion abnormalities. Left  ventricular diastolic function could not  be evaluated.  3. Right ventricular systolic function is normal. The right ventricular  size is normal. There is normal pulmonary artery systolic pressure. The  estimated right ventricular systolic pressure is 0000000 mmHg.  4. Left atrial size was mildly dilated. No left atrial/left atrial  appendage thrombus  was detected.  5. Right atrial size was mildly dilated.  6. The mitral valve is myxomatous. Severe mitral valve regurgitation. No  evidence of mitral stenosis.  7. The aortic valve is normal in structure. Aortic valve regurgitation is  not visualized. No aortic stenosis is present.  8. The inferior vena cava is normal in size with greater than 50%  respiratory variability, suggesting right atrial pressure of 3 mmHg.  9. Agitated saline contrast bubble study was negative, with no evidence  of any interatrial shunt.   Conclusion(s)/Recommendation(s): Normal biventricular function without  evidence of hemodynamically significant valvular heart disease.   FINDINGS  Left Ventricle: Left ventricular ejection fraction, by estimation, is 60  to 65%. The left ventricle has normal function. The left ventricle has no  regional wall motion abnormalities. The left ventricular internal cavity  size was normal in size. There is  no left ventricular hypertrophy. Left ventricular diastolic function  could not be evaluated.   Right Ventricle: The right ventricular size is normal. No increase in  right ventricular wall thickness. Right ventricular systolic function is  normal. There is normal pulmonary artery systolic pressure. The tricuspid  regurgitant velocity is 2.54 m/s, and  with an assumed right atrial pressure of 3 mmHg, the estimated right  ventricular systolic pressure is 0000000 mmHg.   Left Atrium: Left atrial size was mildly dilated. No left atrial/left  atrial appendage thrombus was detected.   Right Atrium: Right atrial size was  mildly dilated. Prominent Eustachian  valve.   Pericardium: There is no evidence of pericardial effusion.   Mitral Valve: The mitral valve is myxomatous. There is moderate prolapse  of both leaflets of the mitral valve. There is severe thickening of the  mitral valve leaflet(s). Normal mobility of the mitral valve leaflets.  Mild mitral annular calcification.  Severe mitral valve regurgitation. No evidence of mitral valve stenosis.   Tricuspid Valve: The tricuspid valve is normal in structure. Tricuspid  valve regurgitation is mild . No evidence of tricuspid stenosis.   Aortic Valve: The aortic valve is normal in structure. Aortic valve  regurgitation is not visualized. No aortic stenosis is present.   Pulmonic Valve: The pulmonic valve was normal in structure. Pulmonic valve  regurgitation is trivial. No evidence of pulmonic stenosis.   Aorta: The aortic root is normal in size and structure. There is minimal  (Grade I) plaque.   Venous: The inferior vena cava is normal in size with greater than 50%  respiratory variability, suggesting right atrial pressure of 3 mmHg.   IAS/Shunts: No atrial level shunt detected by color flow Doppler. Agitated  saline contrast was given intravenously to evaluate for intracardiac  shunting. Agitated saline contrast bubble study was negative, with no  evidence of any interatrial shunt. There  is no evidence of a patent foramen ovale. There is no evidence of an  atrial septal defect.   Additional Comments: A pacer wire is visualized.     TRICUSPID VALVE  TR Peak grad:  25.8 mmHg  TR Vmax:    254.00 cm/s   [name] MD  Electronically signed by [name] MD  Signature Date/Time: 05/23/2019/10:03:48 AM     RIGHT/LEFT HEART CATH AND CORONARY ANGIOGRAPHY  Conclusion  1. No angiographic evidence of CAD 2. Normal right and left sided pressures  Continue with planning for mitral valve surgery.   Surgeon Notes     05/23/2019 9:38 AM CV Procedure signed by [name], MD  Indications  Severe mitral  regurgitation [I34.0 (ICD-10-CM)]  Procedural Details  Technical Details Indication: 79 yo male with severe mitral regurgitation, planning underway for mitral valve repair by Dr. [name].   Procedure: The risks, benefits, complications, treatment options, and expected outcomes were discussed with the patient. The patient and/or family concurred with the proposed plan, giving informed consent. The patient was brought to the cath lab after IV hydration was given. The patient was sedated with Versed and Fentanyl. The IV catheter present in the right antecubital vein was changed for a 5 [place] sheath. Right heart catheterization performed with a balloon tipped catheter. The right wrist was prepped and draped in a sterile fashion. 1% lidocaine was used for local anesthesia. Using the modified Seldinger access technique, a 5 French sheath was placed in the right radial artery. 3 mg Verapamil was given through the sheath. 4000 units IV heparin was given. Standard diagnostic catheters were used to perform selective coronary angiography. A JR4 was used to cross the aortic valve. LV pressures measured. No LV gram. The sheath was removed from the right radial artery and a Terumo hemostasis band was applied at the arteriotomy site on the right wrist.    Estimated blood loss <50 mL.   During this procedure medications were administered to achieve and maintain moderate conscious sedation while the patient's heart rate, blood pressure, and oxygen saturation were continuously monitored and I was present face-to-face 100% of this time.   Contrast  Medication Name Total Dose  iohexol (OMNIPAQUE) 350 MG/ML injection 55 mL    Radiation/Fluoro  Fluoro time: 4.1 (min) DAP: 15479 (mGycm2) Cumulative Air Kerma: 0000000 (mGy)  Complications  Complications documented before study signed (06/06/2019 9:49 AM)   RIGHT/LEFT HEART  CATH AND CORONARY ANGIOGRAPHY  None Documented by Burnell Blanks, MD 06/06/2019 8:17 AM  Date Found: 06/06/2019  Time Range: Intraprocedure      Coronary Findings  Diagnostic Dominance: Right Left Anterior Descending  Vessel is large.  Left Circumflex  Vessel is large.  Right Coronary Artery  Vessel is large.  Intervention  No interventions have been documented. Right Heart  Right Heart Pressures LV EDP is normal.  Coronary Diagrams  Diagnostic Dominance: Right  Intervention  Implants   No implant documentation for this case.  Syngo Images  Show images for CARDIAC CATHETERIZATION  Images on Long Term Storage  Show images for [place]ar[place], Jeppsen A "ROD"   Link to Procedure Log  Procedure Log    Hemo Data   Most Recent Value  Fick Cardiac Output 6.47 L/min  Fick Cardiac Output Index 3.33 (L/min)/BSA  RA A Wave 6 mmHg  RA V Wave 3 mmHg  RA Mean 1 mmHg  RV Systolic Pressure 28 mmHg  RV Diastolic Pressure 1 mmHg  RV EDP 6 mmHg  PA Systolic Pressure 29 mmHg  PA Diastolic Pressure 3 mmHg  PA Mean 14 mmHg  PW A Wave 11 mmHg  PW V Wave 14 mmHg  PW Mean 7 mmHg  AO Systolic Pressure [id number] mmHg  AO Diastolic Pressure 58 mmHg  AO Mean 81 mmHg  LV Systolic Pressure 0000000 mmHg  LV Diastolic Pressure 2 mmHg  LV EDP 8 mmHg  AOp Systolic Pressure [id number] mmHg  AOp Diastolic Pressure 56 mmHg  AOp Mean Pressure 80 mmHg  LVp Systolic Pressure [id number] mmHg  LVp Diastolic Pressure 2 mmHg  LVp EDP Pressure 9 mmHg  QP/QS 1  TPVR Index 4.21 HRUI  TSVR Index 24.38 HRUI  PVR SVR Ratio 0.09  TPVR/TSVR Ratio  0.17    CT ANGIOGRAPHY CHEST, ABDOMEN AND PELVIS  TECHNIQUE: Non-contrast CT of the chest was initially obtained.  Multidetector CT imaging through the chest, abdomen and pelvis was performed using the standard protocol during bolus administration of intravenous contrast. Multiplanar reconstructed images and MIPs were obtained and reviewed to evaluate the  vascular anatomy.  CONTRAST:  12mL ISOVUE-370 IOPAMIDOL (ISOVUE-370) INJECTION 76%  COMPARISON:  Prior CTA chest 03/14/2019 and CT chest 05/03/2018.  FINDINGS: CTA CHEST FINDINGS  Cardiovascular: Conventional 3 vessel arch anatomy. Elongation of the aortic isthmus and proximal descending thoracic aorta resulting a type 3 arch. Mild ectasia of the tubular portion of the ascending thoracic aorta with a maximal transverse diameter of 3.7 cm. No evidence of dissection. The aortic root, transverse aorta and descending thoracic aorta are all normal in caliber. There is scant atherosclerotic plaque. Mild left ventricular dilatation without overt cardiomegaly. Left subclavian approach cardiac rhythm maintenance device with leads terminating in the right atrium and right ventricular apex. No pericardial effusion.  Mediastinum/Nodes: Unremarkable CT appearance of the thyroid gland. No suspicious mediastinal or hilar adenopathy. No soft tissue mediastinal mass. The thoracic esophagus is unremarkable.  Lungs/Pleura: Biapical pleuroparenchymal scarring. A 4 mm pulmonary nodule in the right lower lobe (image 74, series 15) is new compared to prior imaging from January. Stable 5 mm pulmonary nodule which is likely partially calcified in the periphery of the left lower lobe (image 131, series 15). No focal airspace consolidation, pleural effusion, pulmonary edema or pneumothorax.  Musculoskeletal: No acute fracture or aggressive appearing lytic or blastic osseous lesion.  Review of the MIP images confirms the above findings.  CTA ABDOMEN AND PELVIS FINDINGS  VASCULAR  Aorta: Normal caliber abdominal aorta. Mild calcified atherosclerotic plaque. No dissection or penetrating ulceration.  Celiac: Patent without evidence of aneurysm, dissection, vasculitis or significant stenosis. The left hepatic artery is replaced to the left gastric artery.  SMA: Patent without evidence of  aneurysm, dissection, vasculitis or significant stenosis.  Renals: 2 renal arteries are present bilaterally. On the right, the accessory lower pole renal artery arises from the distal most aorta at the bifurcation. Minimal plaque at the origins without significant stenosis.  IMA: Patent without evidence of aneurysm, dissection, vasculitis or significant stenosis.  Inflow: Mild atherosclerotic plaque. Widely patent. The iliac arteries are mildly tortuous. The common iliac arteries are ectatic bilaterally but not aneurysmal. The arteries measure up to 1.4 cm bilaterally.  Veins: No focal venous abnormality.  Review of the MIP images confirms the above findings.  NON-VASCULAR  Hepatobiliary: No focal liver abnormality is seen. No gallstones, gallbladder wall thickening, or biliary dilatation.  Pancreas: Unremarkable. No pancreatic ductal dilatation or surrounding inflammatory changes.  Spleen: Normal in size without focal abnormality.  Adrenals/Urinary Tract: Adrenal glands are unremarkable. Kidneys are normal, without renal calculi, focal lesion, or hydronephrosis. Bladder is unremarkable.  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 the midline and the entirety of the colon is located in the left hemiabdomen including the cecum and appendix. No focal bowel wall thickening or evidence of obstruction.  Lymphatic: No suspicious lymphadenopathy.  Reproductive: Prostatomegaly.  Other: Small fat containing umbilical hernia.  Musculoskeletal: No acute fracture or aggressive appearing lytic or blastic osseous lesion.  Review of the MIP images confirms the above findings.  IMPRESSION: VASCULAR  1. Fairly mild atherosclerotic plaque without evidence of significant stenosis, occlusion, dissection, aneurysm or other acute arterial abnormality. Aortic Atherosclerosis (ICD10-170.0) 2. Mild left ventricular dilatation history of mitral  regurgitation.  3. Bilateral accessory renal arteries. 4. 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 5. Mild ectasia and tortuosity of the bilateral iliac arteries.  NON VASCULAR  1. 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 The duodenum does not cross the midline and the entirety of the colon lies in the left hemiabdomen including the cecum and appendix. 2. Small 4 mm pulmonary nodule in the right lower lobe is new compared to relatively recent prior imaging from January. This almost certainly represents a small focus of active inflammation/infection. No further follow-up required unless the patient is at high risk for pulmonary malignancy. 3. Stable 5 mm left lower lobe benign pulmonary nodule. 4. Small fat containing umbilical hernia.  Signed,  Criselda Peaches, MD, [place]  [facility]  [facility]   Electronically Signed   By: Jacqulynn Cadet M.D.   On: 06/13/2019 15:12     Impression:  Patient has mitral valve prolapse with stage D severe symptomatic primary mitral regurgitation. He describes relatively recent onset of exertional shortness of breath that occurs only with more strenuous physical exertion, consistent with chronic diastolic congestive heart failure, New York Heart Association functional class I-II. I have personally reviewed the patient's recent transthoracic and transesophageal echocardiograms, diagnostic cardiac catheterization, and CT angiograms. 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 There is normal left ventricular systolic function. There is mild left atrial chamber enlargement. The aortic valve appears normal. Right ventricular size and function appear normal. There is no significant tricuspid regurgitation.   Diagnostic cardiac catheterization reveals normal coronary artery anatomy with no significant coronary artery disease.  Right heart pressures are normal.  I agree the patient would benefit from elective mitral valve repair. Risks associated with surgery should be low and elevated only by the patient's age and reported history of mild short-term memory dysfunction. Based upon review of the patient's TEE I feel there is a greater than 95% likelihood of successful and durable mitral valve repair. In the absence of significant coronary artery disease the patient appears to be a good candidate for minimally invasive approach for surgery.    Plan:  The patientand his wife were again counseled at length regarding the indications, risks and potential benefits of mitral valve repair. The rationale for elective surgery has been explained, including a comparison between surgery and continued medical therapy with close follow-up. The likelihood of successful and durable mitral valve repair has been discussed with particular reference to the findings of their recent echocardiogram. Based upon these findings and previous experience, I have quoted them a greater than 95percent likelihood of successful valve repair with less than 1percent risk of mortality or major morbidity. Alternative surgical approaches have been discussed including a comparison between conventional sternotomy and minimally-invasive techniques. The relative risks and benefits of each have been reviewed as they pertain to the patient's specific circumstances, and expectations for the patient's postoperative convalescence has been discussed.   Concerns related to the patient's mild baseline  cognitive deficits and the associated potential for postoperative risk of stroke, further decline in cognition, and/or postoperative delirium were discussed.  The patient understands and accepts all potential risks of surgery including but not limited to risk of death, stroke or other neurologic complication, myocardial  infarction, congestive heart failure, respiratory failure, renal failure, bleeding requiring transfusion and/or reexploration, arrhythmia, infection or other wound complications, pneumonia, pleural and/or pericardial effusion, pulmonary embolus, aortic dissection or other major vascular complication, or delayed complications related to valve repair or replacement including but not limited to structural valve deterioration and failure, thrombosis, embolization, endocarditis, or paravalvular leak.  Specific risks potentially related to the minimally-invasive approach were discussed at length, including but not limited to risk of conversion to full or partial sternotomy, aortic dissection or other major vascular complication, unilateral acute lung injury or pulmonary edema, phrenic nerve dysfunction or paralysis, rib fracture, chronic pain, lung hernia, or lymphocele. All of their questions have been answered.     Valentina Gu. [name], MD 06/13/2019 3:53 PM

## 2019-06-14 ENCOUNTER — Other Ambulatory Visit: Payer: Self-pay

## 2019-06-14 ENCOUNTER — Encounter (HOSPITAL_COMMUNITY)
Admission: RE | Disposition: A | Discharge status: Home / Self Care | Payer: Self-pay | Source: Home / Self Care | Attending: Thoracic Surgery (Cardiothoracic Vascular Surgery)

## 2019-06-14 ENCOUNTER — Inpatient Hospital Stay (HOSPITAL_COMMUNITY)
Admission: RE | Admit: 2019-06-14 | Discharge: 2019-06-20 | DRG: 220 | Disposition: A | Discharge status: Home / Self Care | Payer: Medicare Other | Attending: Thoracic Surgery (Cardiothoracic Vascular Surgery) | Admitting: Thoracic Surgery (Cardiothoracic Vascular Surgery)

## 2019-06-14 ENCOUNTER — Inpatient Hospital Stay (HOSPITAL_COMMUNITY): Payer: Medicare Other

## 2019-06-14 ENCOUNTER — Inpatient Hospital Stay (HOSPITAL_COMMUNITY): Payer: Medicare Other | Admitting: Certified Registered Nurse Anesthetist

## 2019-06-14 DIAGNOSIS — I495 Sick sinus syndrome: Secondary | ICD-10-CM | POA: Diagnosis present

## 2019-06-14 DIAGNOSIS — Z79899 Other long term (current) drug therapy: Secondary | ICD-10-CM

## 2019-06-14 DIAGNOSIS — I5032 Chronic diastolic (congestive) heart failure: Secondary | ICD-10-CM | POA: Diagnosis present

## 2019-06-14 DIAGNOSIS — J9811 Atelectasis: Secondary | ICD-10-CM

## 2019-06-14 DIAGNOSIS — I081 Rheumatic disorders of both mitral and tricuspid valves: Secondary | ICD-10-CM | POA: Diagnosis present

## 2019-06-14 DIAGNOSIS — I10 Essential (primary) hypertension: Secondary | ICD-10-CM | POA: Diagnosis not present

## 2019-06-14 DIAGNOSIS — I340 Nonrheumatic mitral (valve) insufficiency: Secondary | ICD-10-CM | POA: Diagnosis not present

## 2019-06-14 DIAGNOSIS — E785 Hyperlipidemia, unspecified: Secondary | ICD-10-CM | POA: Diagnosis present

## 2019-06-14 DIAGNOSIS — Z8249 Family history of ischemic heart disease and other diseases of the circulatory system: Secondary | ICD-10-CM

## 2019-06-14 DIAGNOSIS — I110 Hypertensive heart disease with heart failure: Secondary | ICD-10-CM | POA: Diagnosis present

## 2019-06-14 DIAGNOSIS — D6959 Other secondary thrombocytopenia: Secondary | ICD-10-CM | POA: Diagnosis not present

## 2019-06-14 DIAGNOSIS — Z950 Presence of cardiac pacemaker: Secondary | ICD-10-CM

## 2019-06-14 DIAGNOSIS — I4510 Unspecified right bundle-branch block: Secondary | ICD-10-CM | POA: Diagnosis present

## 2019-06-14 DIAGNOSIS — J939 Pneumothorax, unspecified: Secondary | ICD-10-CM | POA: Diagnosis not present

## 2019-06-14 DIAGNOSIS — Z96652 Presence of left artificial knee joint: Secondary | ICD-10-CM | POA: Diagnosis present

## 2019-06-14 DIAGNOSIS — Z7982 Long term (current) use of aspirin: Secondary | ICD-10-CM | POA: Diagnosis not present

## 2019-06-14 DIAGNOSIS — Z23 Encounter for immunization: Secondary | ICD-10-CM | POA: Diagnosis not present

## 2019-06-14 DIAGNOSIS — I4891 Unspecified atrial fibrillation: Secondary | ICD-10-CM | POA: Diagnosis not present

## 2019-06-14 DIAGNOSIS — M1990 Unspecified osteoarthritis, unspecified site: Secondary | ICD-10-CM | POA: Diagnosis present

## 2019-06-14 DIAGNOSIS — Z48812 Encounter for surgical aftercare following surgery on the circulatory system: Secondary | ICD-10-CM | POA: Diagnosis not present

## 2019-06-14 DIAGNOSIS — Z9889 Other specified postprocedural states: Secondary | ICD-10-CM

## 2019-06-14 DIAGNOSIS — Z952 Presence of prosthetic heart valve: Secondary | ICD-10-CM | POA: Diagnosis not present

## 2019-06-14 DIAGNOSIS — J90 Pleural effusion, not elsewhere classified: Secondary | ICD-10-CM | POA: Diagnosis not present

## 2019-06-14 DIAGNOSIS — Z820 Family history of epilepsy and other diseases of the nervous system: Secondary | ICD-10-CM

## 2019-06-14 DIAGNOSIS — R413 Other amnesia: Secondary | ICD-10-CM | POA: Diagnosis present

## 2019-06-14 DIAGNOSIS — D62 Acute posthemorrhagic anemia: Secondary | ICD-10-CM | POA: Diagnosis not present

## 2019-06-14 DIAGNOSIS — I341 Nonrheumatic mitral (valve) prolapse: Secondary | ICD-10-CM | POA: Diagnosis present

## 2019-06-14 HISTORY — PX: MITRAL VALVE REPAIR: SHX2039

## 2019-06-14 HISTORY — PX: TEE WITHOUT CARDIOVERSION: SHX5443

## 2019-06-14 LAB — BASIC METABOLIC PANEL
Anion gap: 5 (ref 5–15)
BUN: 13 mg/dL (ref 8–23)
CO2: 24 mmol/L (ref 22–32)
Calcium: 7.3 mg/dL — ABNORMAL LOW (ref 8.9–10.3)
Chloride: 110 mmol/L (ref 98–111)
Creatinine, Ser: 0.73 mg/dL (ref 0.61–1.24)
GFR calc Af Amer: >60 mL/min (ref >60)
GFR calc non Af Amer: >60 mL/min (ref >60)
Glucose, Bld: 120 mg/dL — ABNORMAL HIGH (ref 70–99)
Potassium: 4.4 mmol/L (ref 3.5–5.1)
Sodium: 139 mmol/L (ref 135–145)

## 2019-06-14 LAB — CBC
HCT: 37.4 % — ABNORMAL LOW (ref 39.0–52.0)
HCT: 34.3 % — ABNORMAL LOW (ref 39.0–52.0)
Hemoglobin: 12.2 g/dL — ABNORMAL LOW (ref 13.0–17.0)
Hemoglobin: 11.4 g/dL — ABNORMAL LOW (ref 13.0–17.0)
MCH: 31.3 pg (ref 26.0–34.0)
MCH: 31.5 pg (ref 26.0–34.0)
MCHC: 32.6 g/dL (ref 30.0–36.0)
MCHC: 33.2 g/dL (ref 30.0–36.0)
MCV: 95.9 fL (ref 80.0–100.0)
MCV: 94.8 fL (ref 80.0–100.0)
Platelets: 125 K/uL — ABNORMAL LOW (ref 150–400)
Platelets: 122 10*3/uL — ABNORMAL LOW (ref 150–400)
RBC: 3.9 MIL/uL — ABNORMAL LOW (ref 4.22–5.81)
RBC: 3.62 MIL/uL — ABNORMAL LOW (ref 4.22–5.81)
RDW: 13.2 % (ref 11.5–15.5)
RDW: 13.2 % (ref 11.5–15.5)
WBC: 14.9 K/uL — ABNORMAL HIGH (ref 4.0–10.5)
WBC: 11.7 10*3/uL — ABNORMAL HIGH (ref 4.0–10.5)
nRBC: 0 % (ref 0.0–0.2)
nRBC: 0 % (ref 0.0–0.2)

## 2019-06-14 LAB — POCT I-STAT 7, (LYTES, BLD GAS, ICA,H+H)
Acid-base deficit: 2 mmol/L (ref 0.0–2.0)
Acid-base deficit: 3 mmol/L — ABNORMAL HIGH (ref 0.0–2.0)
Bicarbonate: 26.1 mmol/L (ref 20.0–28.0)
Bicarbonate: 24.2 mmol/L (ref 20.0–28.0)
Calcium, Ion: 1.16 mmol/L (ref 1.15–1.40)
Calcium, Ion: 1.16 mmol/L (ref 1.15–1.40)
HCT: 37 % — ABNORMAL LOW (ref 39.0–52.0)
HCT: 37 % — ABNORMAL LOW (ref 39.0–52.0)
Hemoglobin: 12.6 g/dL — ABNORMAL LOW (ref 13.0–17.0)
Hemoglobin: 12.6 g/dL — ABNORMAL LOW (ref 13.0–17.0)
O2 Saturation: 100 %
O2 Saturation: 98 %
Patient temperature: 36.1
Patient temperature: 36
Potassium: 4.4 mmol/L (ref 3.5–5.1)
Potassium: 4.3 mmol/L (ref 3.5–5.1)
Sodium: 140 mmol/L (ref 135–145)
Sodium: 140 mmol/L (ref 135–145)
TCO2: 28 mmol/L (ref 22–32)
TCO2: 26 mmol/L (ref 22–32)
pCO2 arterial: 54.9 mmHg — ABNORMAL HIGH (ref 32.0–48.0)
pCO2 arterial: 48.3 mmHg — ABNORMAL HIGH (ref 32.0–48.0)
pH, Arterial: 7.281 — ABNORMAL LOW (ref 7.350–7.450)
pH, Arterial: 7.304 — ABNORMAL LOW (ref 7.350–7.450)
pO2, Arterial: 219 mmHg — ABNORMAL HIGH (ref 83.0–108.0)
pO2, Arterial: 118 mmHg — ABNORMAL HIGH (ref 83.0–108.0)

## 2019-06-14 LAB — POCT I-STAT, CHEM 8
BUN: 15 mg/dL (ref 8–23)
Calcium, Ion: 1.1 mmol/L — ABNORMAL LOW (ref 1.15–1.40)
Chloride: 106 mmol/L (ref 98–111)
Creatinine, Ser: 0.6 mg/dL — ABNORMAL LOW (ref 0.61–1.24)
Glucose, Bld: 116 mg/dL — ABNORMAL HIGH (ref 70–99)
HCT: 33 % — ABNORMAL LOW (ref 39.0–52.0)
Hemoglobin: 11.2 g/dL — ABNORMAL LOW (ref 13.0–17.0)
Potassium: 4.3 mmol/L (ref 3.5–5.1)
Sodium: 140 mmol/L (ref 135–145)
TCO2: 26 mmol/L (ref 22–32)

## 2019-06-14 LAB — GLUCOSE, CAPILLARY
Glucose-Capillary: 131 mg/dL — ABNORMAL HIGH (ref 70–99)
Glucose-Capillary: 131 mg/dL — ABNORMAL HIGH (ref 70–99)
Glucose-Capillary: 130 mg/dL — ABNORMAL HIGH (ref 70–99)
Glucose-Capillary: 116 mg/dL — ABNORMAL HIGH (ref 70–99)
Glucose-Capillary: 123 mg/dL — ABNORMAL HIGH (ref 70–99)
Glucose-Capillary: 110 mg/dL — ABNORMAL HIGH (ref 70–99)
Glucose-Capillary: 117 mg/dL — ABNORMAL HIGH (ref 70–99)
Glucose-Capillary: 149 mg/dL — ABNORMAL HIGH (ref 70–99)

## 2019-06-14 LAB — MAGNESIUM: Magnesium: 2.8 mg/dL — ABNORMAL HIGH (ref 1.7–2.4)

## 2019-06-14 LAB — ECHO INTRAOPERATIVE TEE
Height: 69.5 in
Weight: 2720 oz

## 2019-06-14 LAB — PLATELET COUNT: Platelets: 117 10*3/uL — ABNORMAL LOW (ref 150–400)

## 2019-06-14 LAB — PROTIME-INR
INR: 1.3 — ABNORMAL HIGH (ref 0.8–1.2)
Prothrombin Time: 16.3 seconds — ABNORMAL HIGH (ref 11.4–15.2)

## 2019-06-14 LAB — HEMOGLOBIN AND HEMATOCRIT, BLOOD
HCT: 29.5 % — ABNORMAL LOW (ref 39.0–52.0)
Hemoglobin: 9.8 g/dL — ABNORMAL LOW (ref 13.0–17.0)

## 2019-06-14 LAB — APTT: aPTT: 36 seconds (ref 24–36)

## 2019-06-14 SURGERY — REPAIR, MITRAL VALVE, MINIMALLY INVASIVE
Anesthesia: General | Site: Chest | Laterality: Right

## 2019-06-14 MED ORDER — FAMOTIDINE IN NACL 20-0.9 MG/50ML-% IV SOLN
20.0000 mg | Freq: Two times a day (BID) | INTRAVENOUS | Status: AC
  Administered 2019-06-14 (×2): 20 mg via INTRAVENOUS
  Filled 2019-06-14: qty 50

## 2019-06-14 MED ORDER — ASPIRIN 81 MG PO CHEW: 324.0000 mg | CHEWABLE_TABLET | Freq: Every day | ORAL | Status: DC

## 2019-06-14 MED ORDER — ALBUMIN HUMAN 5 % IV SOLN: INTRAVENOUS | Status: DC | PRN

## 2019-06-14 MED ORDER — SODIUM CHLORIDE 0.9 % IV SOLN: INTRAVENOUS | Status: DC

## 2019-06-14 MED ORDER — DEXTROSE 50 % IV SOLN: 0.0000 mL | INTRAVENOUS | Status: DC | PRN

## 2019-06-14 MED ORDER — SODIUM CHLORIDE 0.9% FLUSH: 10.0000 mL | INTRAVENOUS | Status: DC | PRN

## 2019-06-14 MED ORDER — BISACODYL 10 MG RE SUPP: 10.0000 mg | Freq: Every day | RECTAL | Status: DC

## 2019-06-14 MED ORDER — EPHEDRINE 5 MG/ML INJ
INTRAVENOUS | Status: AC
  Filled 2019-06-14: qty 10

## 2019-06-14 MED ORDER — SODIUM CHLORIDE 0.9 % IV SOLN: 250.0000 mL | INTRAVENOUS | Status: DC

## 2019-06-14 MED ORDER — SODIUM CHLORIDE 0.9% FLUSH
3.0000 mL | Freq: Two times a day (BID) | INTRAVENOUS | Status: DC
  Administered 2019-06-15 – 2019-06-19 (×5): 3 mL via INTRAVENOUS

## 2019-06-14 MED ORDER — BUPIVACAINE HCL (PF) 0.5 % IJ SOLN
INTRAMUSCULAR | Status: AC
  Filled 2019-06-14: qty 30

## 2019-06-14 MED ORDER — CHLORHEXIDINE GLUCONATE 0.12 % MT SOLN
OROMUCOSAL | Status: AC
  Administered 2019-06-14: 15 mL via OROMUCOSAL
  Filled 2019-06-14: qty 15

## 2019-06-14 MED ORDER — ASPIRIN EC 325 MG PO TBEC: 325.0000 mg | DELAYED_RELEASE_TABLET | Freq: Every day | ORAL | Status: DC

## 2019-06-14 MED ORDER — TRAMADOL HCL 50 MG PO TABS
50.0000 mg | ORAL_TABLET | ORAL | Status: DC | PRN
  Administered 2019-06-15 – 2019-06-17 (×5): 100 mg via ORAL
  Administered 2019-06-19: 50 mg via ORAL
  Administered 2019-06-19: 100 mg via ORAL
  Filled 2019-06-14 (×4): qty 2
  Filled 2019-06-14: qty 1
  Filled 2019-06-14 (×3): qty 2
  Filled 2019-06-14 (×2): qty 1

## 2019-06-14 MED ORDER — LACTATED RINGERS IV SOLN: INTRAVENOUS | Status: DC | PRN

## 2019-06-14 MED ORDER — SUCCINYLCHOLINE CHLORIDE 200 MG/10ML IV SOSY
PREFILLED_SYRINGE | INTRAVENOUS | Status: AC
  Filled 2019-06-14: qty 10

## 2019-06-14 MED ORDER — PHENYLEPHRINE HCL-NACL 10-0.9 MG/250ML-% IV SOLN
INTRAVENOUS | Status: AC
  Filled 2019-06-14: qty 250

## 2019-06-14 MED ORDER — SODIUM CHLORIDE 0.9 % IV SOLN
1.5000 g | Freq: Two times a day (BID) | INTRAVENOUS | Status: DC
  Filled 2019-06-14 (×2): qty 1.5

## 2019-06-14 MED ORDER — SODIUM CHLORIDE 0.45 % IV SOLN: INTRAVENOUS | Status: DC | PRN

## 2019-06-14 MED ORDER — BUPIVACAINE LIPOSOME 1.3 % IJ SUSP
INTRAMUSCULAR | Status: DC | PRN
  Administered 2019-06-14: 50 mL

## 2019-06-14 MED ORDER — ONDANSETRON HCL 4 MG/2ML IJ SOLN: 4.0000 mg | Freq: Four times a day (QID) | INTRAMUSCULAR | Status: DC | PRN

## 2019-06-14 MED ORDER — SODIUM CHLORIDE 0.9 % IV SOLN: INTRAVENOUS | Status: DC | PRN

## 2019-06-14 MED ORDER — LACTATED RINGERS IV SOLN: 500.0000 mL | Freq: Once | INTRAVENOUS | Status: DC | PRN

## 2019-06-14 MED ORDER — ROCURONIUM BROMIDE 10 MG/ML (PF) SYRINGE
PREFILLED_SYRINGE | INTRAVENOUS | Status: AC
  Filled 2019-06-14: qty 10

## 2019-06-14 MED ORDER — LACTATED RINGERS IV SOLN: INTRAVENOUS | Status: DC

## 2019-06-14 MED ORDER — ACETAMINOPHEN 650 MG RE SUPP
650.0000 mg | Freq: Once | RECTAL | Status: AC
  Administered 2019-06-14: 15:00:00 650 mg via RECTAL

## 2019-06-14 MED ORDER — PANTOPRAZOLE SODIUM 40 MG PO TBEC
40.0000 mg | DELAYED_RELEASE_TABLET | Freq: Every day | ORAL | Status: DC
  Administered 2019-06-16 – 2019-06-20 (×5): 40 mg via ORAL
  Filled 2019-06-14 (×5): qty 1

## 2019-06-14 MED ORDER — PROTAMINE SULFATE 10 MG/ML IV SOLN
INTRAVENOUS | Status: AC
  Filled 2019-06-14: qty 25

## 2019-06-14 MED ORDER — METOPROLOL TARTRATE 5 MG/5ML IV SOLN: 2.5000 mg | INTRAVENOUS | Status: DC | PRN

## 2019-06-14 MED ORDER — BISACODYL 5 MG PO TBEC
10.0000 mg | DELAYED_RELEASE_TABLET | Freq: Every day | ORAL | Status: DC
  Administered 2019-06-15 – 2019-06-19 (×5): 10 mg via ORAL
  Filled 2019-06-14 (×5): qty 2

## 2019-06-14 MED ORDER — ACETAMINOPHEN 160 MG/5ML PO SOLN: 1000.0000 mg | Freq: Four times a day (QID) | ORAL | Status: DC

## 2019-06-14 MED ORDER — ACETAMINOPHEN 500 MG PO TABS
1000.0000 mg | ORAL_TABLET | Freq: Once | ORAL | Status: AC
  Administered 2019-06-14: 06:00:00 1000 mg via ORAL
  Filled 2019-06-14: qty 2

## 2019-06-14 MED ORDER — ACETAMINOPHEN 160 MG/5ML PO SOLN: 650.0000 mg | Freq: Once | ORAL | Status: AC

## 2019-06-14 MED ORDER — ESMOLOL HCL 100 MG/10ML IV SOLN
INTRAVENOUS | Status: AC
  Filled 2019-06-14: qty 10

## 2019-06-14 MED ORDER — MIDAZOLAM HCL 5 MG/5ML IJ SOLN
INTRAMUSCULAR | Status: DC | PRN
  Administered 2019-06-14 (×3): 1 mg via INTRAVENOUS
  Administered 2019-06-14: 2 mg via INTRAVENOUS

## 2019-06-14 MED ORDER — SODIUM CHLORIDE 0.9 % IV SOLN
1.5000 g | Freq: Two times a day (BID) | INTRAVENOUS | Status: AC
  Administered 2019-06-14 – 2019-06-16 (×4): 1.5 g via INTRAVENOUS
  Filled 2019-06-14 (×5): qty 1.5

## 2019-06-14 MED ORDER — CHLORHEXIDINE GLUCONATE CLOTH 2 % EX PADS
6.0000 | MEDICATED_PAD | Freq: Every day | CUTANEOUS | Status: DC
  Administered 2019-06-14 – 2019-06-17 (×3): 6 via TOPICAL

## 2019-06-14 MED ORDER — PHENYLEPHRINE HCL-NACL 20-0.9 MG/250ML-% IV SOLN: 0.0000 ug/min | INTRAVENOUS | Status: DC

## 2019-06-14 MED ORDER — DEXAMETHASONE SODIUM PHOSPHATE 10 MG/ML IJ SOLN
INTRAMUSCULAR | Status: DC | PRN
  Administered 2019-06-14: 5 mg via INTRAVENOUS

## 2019-06-14 MED ORDER — PROPOFOL 10 MG/ML IV BOLUS
INTRAVENOUS | Status: AC
  Filled 2019-06-14: qty 40

## 2019-06-14 MED ORDER — PROTAMINE SULFATE 10 MG/ML IV SOLN
INTRAVENOUS | Status: DC | PRN
  Administered 2019-06-14: 10 mg via INTRAVENOUS
  Administered 2019-06-14: 260 mg via INTRAVENOUS

## 2019-06-14 MED ORDER — MAGNESIUM SULFATE 4 GM/100ML IV SOLN
INTRAVENOUS | Status: AC
  Administered 2019-06-14: 15:00:00 4 g via INTRAVENOUS
  Filled 2019-06-14: qty 100

## 2019-06-14 MED ORDER — SODIUM CHLORIDE 0.9% FLUSH
10.0000 mL | Freq: Two times a day (BID) | INTRAVENOUS | Status: DC
  Administered 2019-06-14 – 2019-06-20 (×5): 10 mL

## 2019-06-14 MED ORDER — OXYCODONE HCL 5 MG PO TABS
5.0000 mg | ORAL_TABLET | ORAL | Status: DC | PRN
  Administered 2019-06-14 – 2019-06-15 (×4): 5 mg via ORAL
  Administered 2019-06-15 (×3): 10 mg via ORAL
  Filled 2019-06-14 (×3): qty 1
  Filled 2019-06-14 (×2): qty 2
  Filled 2019-06-14: qty 1
  Filled 2019-06-14: qty 2

## 2019-06-14 MED ORDER — ROCURONIUM BROMIDE 100 MG/10ML IV SOLN
INTRAVENOUS | Status: DC | PRN
  Administered 2019-06-14: 50 mg via INTRAVENOUS
  Administered 2019-06-14: 80 mg via INTRAVENOUS
  Administered 2019-06-14: 50 mg via INTRAVENOUS
  Administered 2019-06-14 (×2): 20 mg via INTRAVENOUS

## 2019-06-14 MED ORDER — 0.9 % SODIUM CHLORIDE (POUR BTL) OPTIME
TOPICAL | Status: DC | PRN
  Administered 2019-06-14: 1000 mL

## 2019-06-14 MED ORDER — NITROGLYCERIN IN D5W 200-5 MCG/ML-% IV SOLN: 0.0000 ug/min | INTRAVENOUS | Status: DC

## 2019-06-14 MED ORDER — ALBUMIN HUMAN 5 % IV SOLN
250.0000 mL | INTRAVENOUS | Status: DC | PRN
  Administered 2019-06-14 – 2019-06-15 (×3): 12.5 g via INTRAVENOUS
  Filled 2019-06-14: qty 250

## 2019-06-14 MED ORDER — FENTANYL CITRATE (PF) 250 MCG/5ML IJ SOLN
INTRAMUSCULAR | Status: AC
  Filled 2019-06-14: qty 20

## 2019-06-14 MED ORDER — POTASSIUM CHLORIDE 10 MEQ/50ML IV SOLN: 10.0000 meq | INTRAVENOUS | Status: AC

## 2019-06-14 MED ORDER — LIDOCAINE 2% (20 MG/ML) 5 ML SYRINGE
INTRAMUSCULAR | Status: AC
  Filled 2019-06-14: qty 5

## 2019-06-14 MED ORDER — FENTANYL CITRATE (PF) 250 MCG/5ML IJ SOLN
INTRAMUSCULAR | Status: DC | PRN
  Administered 2019-06-14 (×7): 50 ug via INTRAVENOUS
  Administered 2019-06-14: 300 ug via INTRAVENOUS
  Administered 2019-06-14: 50 ug via INTRAVENOUS

## 2019-06-14 MED ORDER — VANCOMYCIN HCL IN DEXTROSE 1-5 GM/200ML-% IV SOLN
1000.0000 mg | Freq: Once | INTRAVENOUS | Status: AC
  Administered 2019-06-14: 1000 mg via INTRAVENOUS
  Filled 2019-06-14: qty 200

## 2019-06-14 MED ORDER — ONDANSETRON HCL 4 MG/2ML IJ SOLN
INTRAMUSCULAR | Status: DC | PRN
  Administered 2019-06-14: 4 mg via INTRAVENOUS

## 2019-06-14 MED ORDER — INSULIN ASPART 100 UNIT/ML ~~LOC~~ SOLN
0.0000 [IU] | SUBCUTANEOUS | Status: DC
  Administered 2019-06-14 – 2019-06-15 (×2): 2 [IU] via SUBCUTANEOUS

## 2019-06-14 MED ORDER — BUPIVACAINE LIPOSOME 1.3 % IJ SUSP
20.0000 mL | Freq: Once | INTRAMUSCULAR | Status: DC
  Filled 2019-06-14: qty 20

## 2019-06-14 MED ORDER — INSULIN REGULAR(HUMAN) IN NACL 100-0.9 UT/100ML-% IV SOLN: INTRAVENOUS | Status: DC

## 2019-06-14 MED ORDER — CHLORHEXIDINE GLUCONATE 0.12 % MT SOLN: 15.0000 mL | OROMUCOSAL | Status: AC

## 2019-06-14 MED ORDER — METOPROLOL TARTRATE 12.5 MG HALF TABLET
ORAL_TABLET | ORAL | Status: AC
  Administered 2019-06-14: 12.5 mg via ORAL
  Filled 2019-06-14: qty 1

## 2019-06-14 MED ORDER — HEPARIN SODIUM (PORCINE) 1000 UNIT/ML IJ SOLN
INTRAMUSCULAR | Status: AC
  Filled 2019-06-14: qty 1

## 2019-06-14 MED ORDER — PHENYLEPHRINE 40 MCG/ML (10ML) SYRINGE FOR IV PUSH (FOR BLOOD PRESSURE SUPPORT)
PREFILLED_SYRINGE | INTRAVENOUS | Status: AC
  Filled 2019-06-14: qty 10

## 2019-06-14 MED ORDER — CHLORHEXIDINE GLUCONATE 4 % EX LIQD: 30.0000 mL | CUTANEOUS | Status: DC

## 2019-06-14 MED ORDER — FENTANYL CITRATE (PF) 250 MCG/5ML IJ SOLN
INTRAMUSCULAR | Status: AC
  Filled 2019-06-14: qty 5

## 2019-06-14 MED ORDER — ACETAMINOPHEN 500 MG PO TABS
1000.0000 mg | ORAL_TABLET | Freq: Four times a day (QID) | ORAL | Status: AC
  Administered 2019-06-15 – 2019-06-19 (×18): 1000 mg via ORAL
  Filled 2019-06-14 (×17): qty 2

## 2019-06-14 MED ORDER — MIDAZOLAM HCL 2 MG/2ML IJ SOLN: 2.0000 mg | INTRAMUSCULAR | Status: DC | PRN

## 2019-06-14 MED ORDER — MORPHINE SULFATE (PF) 2 MG/ML IV SOLN
1.0000 mg | INTRAVENOUS | Status: DC | PRN
  Administered 2019-06-14 – 2019-06-18 (×6): 2 mg via INTRAVENOUS
  Filled 2019-06-14 (×7): qty 1

## 2019-06-14 MED ORDER — SODIUM CHLORIDE 0.9 % IR SOLN
Status: DC | PRN
  Administered 2019-06-14: 3000 mL

## 2019-06-14 MED ORDER — METOPROLOL TARTRATE 12.5 MG HALF TABLET: 12.5000 mg | ORAL_TABLET | Freq: Once | ORAL | Status: AC

## 2019-06-14 MED ORDER — MIDAZOLAM HCL (PF) 10 MG/2ML IJ SOLN
INTRAMUSCULAR | Status: AC
  Filled 2019-06-14: qty 2

## 2019-06-14 MED ORDER — SUGAMMADEX SODIUM 200 MG/2ML IV SOLN
INTRAVENOUS | Status: DC | PRN
  Administered 2019-06-14: 200 mg via INTRAVENOUS

## 2019-06-14 MED ORDER — DOCUSATE SODIUM 100 MG PO CAPS
200.0000 mg | ORAL_CAPSULE | Freq: Every day | ORAL | Status: DC
  Administered 2019-06-15 – 2019-06-19 (×5): 200 mg via ORAL
  Filled 2019-06-14 (×5): qty 2

## 2019-06-14 MED ORDER — SODIUM CHLORIDE 0.9% FLUSH: 3.0000 mL | INTRAVENOUS | Status: DC | PRN

## 2019-06-14 MED ORDER — DEXMEDETOMIDINE HCL IN NACL 400 MCG/100ML IV SOLN
0.0000 ug/kg/h | INTRAVENOUS | Status: DC
  Administered 2019-06-15: 01:00:00 0.2 ug/kg/h via INTRAVENOUS
  Filled 2019-06-14: qty 100

## 2019-06-14 MED ORDER — PROPOFOL 10 MG/ML IV BOLUS
INTRAVENOUS | Status: DC | PRN
  Administered 2019-06-14: 100 mg via INTRAVENOUS

## 2019-06-14 MED ORDER — CHLORHEXIDINE GLUCONATE 0.12 % MT SOLN: 15.0000 mL | Freq: Once | OROMUCOSAL | Status: DC

## 2019-06-14 MED ORDER — MAGNESIUM SULFATE 4 GM/100ML IV SOLN: 4.0000 g | Freq: Once | INTRAVENOUS | Status: AC

## 2019-06-14 SURGICAL SUPPLY — 100 items
ADAPTER CARDIO PERF ANTE/RETRO (ADAPTER) ×3 IMPLANT
BAG DECANTER FOR FLEXI CONT (MISCELLANEOUS) ×6 IMPLANT
BLADE CLIPPER SURG (BLADE) ×3 IMPLANT
BLADE STERNUM SYSTEM 6 (BLADE) ×1 IMPLANT
BLADE SURG 11 STRL SS (BLADE) ×3 IMPLANT
CANISTER SUCT 3000ML PPV (MISCELLANEOUS) ×6 IMPLANT
CANNULA FEM VENOUS REMOTE 22FR (CANNULA) ×1 IMPLANT
CANNULA FEMORAL ART 14 SM (MISCELLANEOUS) ×3 IMPLANT
CANNULA GUNDRY RCSP 15FR (MISCELLANEOUS) ×3 IMPLANT
CANNULA OPTISITE PERFUSION 18F (CANNULA) ×1 IMPLANT
CANNULA SUMP PERICARDIAL (CANNULA) ×6 IMPLANT
CELLS DAT CNTRL 66122 CELL SVR (MISCELLANEOUS) ×2 IMPLANT
CONN ST 1/4X3/8  BEN (MISCELLANEOUS) ×6
CONN ST 1/4X3/8 BEN (MISCELLANEOUS) ×4 IMPLANT
CONNECTOR 1/2X3/8X1/2 3 WAY (MISCELLANEOUS) ×3
CONNECTOR 1/2X3/8X1/2 3WAY (MISCELLANEOUS) ×2 IMPLANT
COVER BACK TABLE 24X17X13 BIG (DRAPES) ×3 IMPLANT
COVER PROBE W GEL 5X96 (DRAPES) ×3 IMPLANT
DERMABOND ADVANCED (GAUZE/BANDAGES/DRESSINGS) ×2
DERMABOND ADVANCED .7 DNX12 (GAUZE/BANDAGES/DRESSINGS) ×4 IMPLANT
DEVICE CLOSURE PERCLS PRGLD 6F (VASCULAR PRODUCTS) ×8 IMPLANT
DEVICE SUT CK QUICK LOAD INDV (Prosthesis & Implant Heart) ×1 IMPLANT
DEVICE SUT CK QUICK LOAD MINI (Prosthesis & Implant Heart) ×2 IMPLANT
DEVICE TROCAR PUNCTURE CLOSURE (ENDOMECHANICALS) ×3 IMPLANT
DRAIN CHANNEL 28F RND 3/8 FF (WOUND CARE) ×6 IMPLANT
DRAPE C-ARM 42X72 X-RAY (DRAPES) ×3 IMPLANT
DRAPE CV SPLIT W-CLR ANES SCRN (DRAPES) ×3 IMPLANT
DRAPE INCISE IOBAN 66X45 STRL (DRAPES) ×9 IMPLANT
DRAPE PERI GROIN 82X75IN TIB (DRAPES) ×3 IMPLANT
DRAPE SLUSH/WARMER DISC (DRAPES) ×3 IMPLANT
DRSG AQUACEL AG ADV 3.5X 4 (GAUZE/BANDAGES/DRESSINGS) ×1 IMPLANT
DRSG COVADERM 4X8 (GAUZE/BANDAGES/DRESSINGS) ×3 IMPLANT
ELECT BLADE 6.5 EXT (BLADE) ×3 IMPLANT
ELECT REM PT RETURN 9FT ADLT (ELECTROSURGICAL) ×6
ELECTRODE REM PT RTRN 9FT ADLT (ELECTROSURGICAL) ×4 IMPLANT
FELT TEFLON 1X6 (MISCELLANEOUS) ×3 IMPLANT
GAUZE SPONGE 4X4 12PLY STRL LF (GAUZE/BANDAGES/DRESSINGS) ×3 IMPLANT
GLOVE BIOGEL M 6.5 STRL (GLOVE) ×3 IMPLANT
GLOVE BIOGEL M STER SZ 6 (GLOVE) ×2 IMPLANT
GLOVE BIOGEL PI IND STRL 6 (GLOVE) IMPLANT
GLOVE BIOGEL PI IND STRL 6.5 (GLOVE) IMPLANT
GLOVE BIOGEL PI IND STRL 7.0 (GLOVE) IMPLANT
GLOVE BIOGEL PI INDICATOR 6 (GLOVE) ×1
GLOVE BIOGEL PI INDICATOR 6.5 (GLOVE) ×2
GLOVE BIOGEL PI INDICATOR 7.0 (GLOVE) ×1
GLOVE ORTHO TXT STRL SZ7.5 (GLOVE) ×9 IMPLANT
GOWN STRL REUS W/ TWL LRG LVL3 (GOWN DISPOSABLE) ×8 IMPLANT
GOWN STRL REUS W/TWL LRG LVL3 (GOWN DISPOSABLE) ×21
GRASPER SUT TROCAR 14GX15 (MISCELLANEOUS) ×3 IMPLANT
KIT BASIN OR (CUSTOM PROCEDURE TRAY) ×3 IMPLANT
KIT DILATOR VASC 18G NDL (KITS) ×3 IMPLANT
KIT DRAINAGE VACCUM ASSIST (KITS) ×1 IMPLANT
KIT SUCTION CATH 14FR (SUCTIONS) ×3 IMPLANT
KIT SUT CK MINI COMBO 4X17 (Prosthesis & Implant Heart) ×1 IMPLANT
KIT TURNOVER KIT B (KITS) ×3 IMPLANT
LEAD PACING MYOCARDI (MISCELLANEOUS) ×3 IMPLANT
LINE VENT (MISCELLANEOUS) ×1 IMPLANT
NDL AORTIC ROOT 14G 7F (CATHETERS) ×2 IMPLANT
NEEDLE AORTIC ROOT 14G 7F (CATHETERS) ×3 IMPLANT
NS IRRIG 1000ML POUR BTL (IV SOLUTION) ×14 IMPLANT
PACK E MIN INVASIVE VALVE (SUTURE) ×3 IMPLANT
PACK OPEN HEART (CUSTOM PROCEDURE TRAY) ×3 IMPLANT
PAD ARMBOARD 7.5X6 YLW CONV (MISCELLANEOUS) ×6 IMPLANT
PAD ELECT DEFIB RADIOL ZOLL (MISCELLANEOUS) ×3 IMPLANT
PERCLOSE PROGLIDE 6F (VASCULAR PRODUCTS) ×15
POSITIONER HEAD DONUT 9IN (MISCELLANEOUS) ×3 IMPLANT
RETRACTOR WND ALEXIS 18 MED (MISCELLANEOUS) ×2 IMPLANT
RING MITRAL MEMO 4D 40 (Prosthesis & Implant Heart) ×1 IMPLANT
RTRCTR WOUND ALEXIS 18CM MED (MISCELLANEOUS) ×3
SET CANNULATION TOURNIQUET (MISCELLANEOUS) ×3 IMPLANT
SET CARDIOPLEGIA MPS 5001102 (MISCELLANEOUS) ×1 IMPLANT
SET IRRIG TUBING LAPAROSCOPIC (IRRIGATION / IRRIGATOR) ×3 IMPLANT
SET MICROPUNCTURE 5F STIFF (MISCELLANEOUS) ×3 IMPLANT
SHEATH PINNACLE 8F 10CM (SHEATH) ×9 IMPLANT
SOL ANTI FOG 6CC (MISCELLANEOUS) ×2 IMPLANT
SOLUTION ANTI FOG 6CC (MISCELLANEOUS) ×1
SUT BONE WAX W31G (SUTURE) ×3 IMPLANT
SUT ETHIBOND (SUTURE) ×3 IMPLANT
SUT ETHIBOND 2 0 SH (SUTURE) ×1 IMPLANT
SUT ETHIBOND 2-0 RB-1 WHT (SUTURE) ×3 IMPLANT
SUT ETHIBOND X763 2 0 SH 1 (SUTURE) ×4 IMPLANT
SUT PROLENE 3 0 SH1 36 (SUTURE) ×12 IMPLANT
SUT PROLENE 4 0 RB 1 (SUTURE) ×6
SUT PROLENE 4-0 RB1 .5 CRCL 36 (SUTURE) IMPLANT
SUT PTFE CHORD X 16MM (SUTURE) ×2 IMPLANT
SUT SILK  1 MH (SUTURE) ×3
SUT SILK 1 MH (SUTURE) IMPLANT
SYSTEM SAHARA CHEST DRAIN ATS (WOUND CARE) ×3 IMPLANT
TAPE CLOTH SURG 4X10 WHT LF (GAUZE/BANDAGES/DRESSINGS) ×1 IMPLANT
TAPE PAPER 3X10 WHT MICROPORE (GAUZE/BANDAGES/DRESSINGS) ×1 IMPLANT
TOWEL GREEN STERILE (TOWEL DISPOSABLE) ×3 IMPLANT
TOWEL GREEN STERILE FF (TOWEL DISPOSABLE) ×3 IMPLANT
TRAY FOLEY SLVR 16FR TEMP STAT (SET/KITS/TRAYS/PACK) ×3 IMPLANT
TROCAR XCEL BLADELESS 5X75MML (TROCAR) ×3 IMPLANT
TROCAR XCEL NON-BLD 11X100MML (ENDOMECHANICALS) ×6 IMPLANT
TUBE SUCT INTRACARD DLP 20F (MISCELLANEOUS) ×3 IMPLANT
UNDERPAD 30X30 (UNDERPADS AND DIAPERS) ×3 IMPLANT
WATER STERILE IRR 1000ML POUR (IV SOLUTION) ×6 IMPLANT
WIRE EMERALD 3MM-J .035X150CM (WIRE) ×3 IMPLANT
YANKAUER SUCT BULB TIP NO VENT (SUCTIONS) ×1 IMPLANT

## 2019-06-14 NOTE — Brief Op Note (Signed)
06/14/2019  12:22 PM  PATIENT:  Deatra Canter  79 y.o. male  PRE-OPERATIVE DIAGNOSIS:  Mitral Regurgitaion  POST-OPERATIVE DIAGNOSIS:  Mitral Regurgitaion  PROCEDURE:  Procedure(s):  MINIMALLY INVASIVE MITRAL VALVE REPAIR  -Ring Annuloplasty with a 40 mm Sorin Memo 4D Ring -Placement of Gore-tex Neochords x 12 to Posterior Leaflets P1, P2, P3  TRANSESOPHAGEAL ECHOCARDIOGRAM (TEE) (N/A)  SURGEON:  Surgeon(s) and Role:    [name], MD - Primary  PHYSICIAN ASSISTANT: Ellwood Handler PA-C  ANESTHESIA:   general  EBL:  Per perfusion record  BLOOD ADMINISTERED: CELLSAVER  DRAINS: right pleural chest tubes   LOCAL MEDICATIONS USED:  NONE  SPECIMEN:  No Specimen  DISPOSITION OF SPECIMEN:  N/A  COUNTS:  YES  TOURNIQUET:  * No tourniquets in log *  DICTATION: .Dragon Dictation  PLAN OF CARE: Admit to inpatient   PATIENT DISPOSITION:  ICU - intubated and hemodynamically stable.   Delay start of Pharmacological VTE agent (>24hrs) due to surgical blood loss or risk of bleeding: yes

## 2019-06-14 NOTE — Transfer of Care (Signed)
Immediate Anesthesia Transfer of Care Note  Patient: Alexander [name]  Procedure(s) Performed: MINIMALLY INVASIVE MITRAL VALVE REPAIR (MVR)<TEE (Right Chest) TRANSESOPHAGEAL ECHOCARDIOGRAM (TEE) (N/A )  Patient Location: SICU  Anesthesia Type:General  Level of Consciousness: sedated  Airway & Oxygen Therapy: Patient Spontanous Breathing and Patient connected to face mask oxygen  Post-op Assessment: Report given to RN and Post -op Vital signs reviewed and stable  Post vital signs: Reviewed and stable  Last Vitals:  Vitals Value Taken Time  BP 125/88 06/14/19 1430  Temp 36.1 C 06/14/19 1435  Pulse 80 06/14/19 1435  Resp 12 06/14/19 1435  SpO2 100 % 06/14/19 1435  Vitals shown include unvalidated device data.  Last Pain:  Vitals:   06/14/19 0611  TempSrc: Oral  PainSc: 0-No pain      Patients Stated Pain Goal: 3 ([id number])  Complications: No apparent anesthesia complications

## 2019-06-14 NOTE — Anesthesia Procedure Notes (Addendum)
Procedure Name: Intubation Date/Time: 06/14/2019 8:10 AM Performed by: Clearnce Sorrel, CRNA Pre-anesthesia Checklist: Patient identified, Emergency Drugs available, Suction available and Patient being monitored Patient Re-evaluated:Patient Re-evaluated prior to induction Oxygen Delivery Method: Circle System Utilized Preoxygenation: Pre-oxygenation with 100% oxygen Induction Type: IV induction Ventilation: Two handed mask ventilation required and Oral airway inserted - appropriate to patient size Laryngoscope Size: Mac and 4 Grade View: Grade II Endobronchial tube: Double lumen EBT, EBT position confirmed by auscultation and Bronchial Blocker placed under direct vision and 37 Fr Number of attempts: 1 Airway Equipment and Method: Stylet and Oral airway Placement Confirmation: ETT inserted through vocal cords under direct vision,  positive ETCO2 and breath sounds checked- equal and bilateral Secured at: 24 cm Tube secured with: Tape Dental Injury: Teeth and Oropharynx as per pre-operative assessment

## 2019-06-14 NOTE — Progress Notes (Signed)
  Echocardiogram Echocardiogram Transesophageal has been performed.  Brysten Reister A Taleeyah Bora 06/14/2019, 8:57 AM

## 2019-06-14 NOTE — Anesthesia Procedure Notes (Signed)
Central Venous Catheter Insertion Performed by: Lillia Abed, MD, anesthesiologist Start/End4/08/2019 6:50 AM, 06/14/2019 7:05 AM Patient location: Pre-op. Preanesthetic checklist: patient identified, IV checked, risks and benefits discussed, surgical consent, monitors and equipment checked, pre-op evaluation, timeout performed and anesthesia consent Position: Trendelenburg Lidocaine 1% used for infiltration and patient sedated Hand hygiene performed  and maximum sterile barriers used  Catheter size: 8.5 Fr PA cath was placed.MAC introducer Procedure performed using ultrasound guided technique. Ultrasound Notes:anatomy identified, needle tip was noted to be adjacent to the nerve/plexus identified, no ultrasound evidence of intravascular and/or intraneural injection and image(s) printed for medical record Attempts: 1 Following insertion, line sutured, dressing applied and Biopatch. Post procedure assessment: blood return through all ports, free fluid flow and no air  Patient tolerated the procedure well with no immediate complications.

## 2019-06-14 NOTE — Interval H&P Note (Signed)
History and Physical Interval Note:  06/14/2019 6:19 AM  Alexander [name]  has presented today for surgery, with the diagnosis of Mitral Regurgitaion.  The various methods of treatment have been discussed with the patient and family. After consideration of risks, benefits and other options for treatment, the patient has consented to  Procedure(s): MINIMALLY INVASIVE MITRAL VALVE REPAIR (MVR)<TEE (Right) TRANSESOPHAGEAL ECHOCARDIOGRAM (TEE) (N/A) as a surgical intervention.  The patient's history has been reviewed, patient examined, no change in status, stable for surgery.  I have reviewed the patient's chart and labs.  Questions were answered to the patient's satisfaction.     [name]

## 2019-06-14 NOTE — Anesthesia Procedure Notes (Signed)
Arterial Line Insertion Start/End4/08/2019 6:45 AM Performed by: Clearnce Sorrel, CRNA, CRNA  Patient location: Pre-op. Preanesthetic checklist: patient identified, IV checked, site marked, risks and benefits discussed, surgical consent, monitors and equipment checked, pre-op evaluation, timeout performed and anesthesia consent Lidocaine 1% used for infiltration and patient sedated Left, radial was placed Catheter size: 20 G Hand hygiene performed  and maximum sterile barriers used  Allen's test indicative of satisfactory collateral circulation Attempts: 2 Procedure performed without using ultrasound guided technique. Following insertion, dressing applied and Biopatch. Post procedure assessment: normal  Patient tolerated the procedure well with no immediate complications.

## 2019-06-14 NOTE — Hospital Course (Addendum)
Patient underwent elective mitral valve repair via right minithoracotomy approach on June 14, 2019.  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  The procedure was uncomplicated and there was no residual mitral regurgitation on follow-up TEE after separation from bypass.  Patient was extubated in the operating room and transferred to the cardiovascular ICU where he remained hemodynamically stable. By the first post-operative day he was requiring no vasoactive drips.He was mobilized early postoperatively and made appropriate progress. He remained in a stable sinus rhythm. Pacer wires were removed on the 4th post-op day and he was transferred to 4E Progressive Care. Chest tubes were left in place until drainage was minimal. Patient developed post-op atrial fibrillation and was started on amiodarone. He promptly converted back to sinus rhythm.  The remainder of his recovery was uneventful and he was discharged home in sinus rhythm on POD 6

## 2019-06-14 NOTE — Progress Notes (Signed)
TCTS BRIEF SICU PROGRESS NOTE  Day of Surgery  S/P Procedure(s) (LRB): MINIMALLY INVASIVE MITRAL VALVE REPAIR (MVR)<TEE (Right) TRANSESOPHAGEAL ECHOCARDIOGRAM (TEE) (N/A)   Looks good - reports feeling "slightly uncomfortable, but not hurting" Paced rhythm w/ stable hemodynamics, no drips Breathing comfortably w/ O2 sats 100% on [place] Chest tube output low UOP adequate  Plan: Continue current plan  [name], MD 06/14/2019 6:33 PM

## 2019-06-14 NOTE — Op Note (Signed)
CARDIOTHORACIC SURGERY OPERATIVE NOTE  Date of Procedure:  06/14/2019  Preoperative Diagnosis: Severe Mitral Regurgitation  Postoperative Diagnosis: Same  Procedure:    Minimally-Invasive Mitral Valve Repair  Complex valvuloplasty including artificial Gore-tex neochord placement x12  Sorin Memo 4D Ring Annuloplasty (size 28mm, catalog # [id number], serial # [id number])    Surgeon: Valentina Gu. 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  Patient states that he has known of a presence of a heart murmur and mitral valve prolapse for many years. For a long time he was followed by Dr. Mare Ferrari, and more recently he has been followed by Dr. [name]. He has remained physically very active all of his adult life and exercises on a regular basis. 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 He underwent permanent pacemaker placement and has done well ever since. Over the last several months the patient has developed new onset of exertional shortness of breath. Symptoms only occur with more strenuous exertion, although the patient  had 1 or 2 episodes of severe shortness of breath following strenuous exertion that was slow to recover. Despite this he has still remained active and he continues to exercise on a regular basis. He was seen in follow-up recently by Dr. [name] and noted to have a more prominent systolic murmur on physical exam. Follow-up transthoracic echocardiogram revealed normal left ventricular systolic function with at least moderate mitral regurgitation. TEE was performed May 23, 2019 confirmed the presence of myxomatous degenerative disease with bileaflet prolapse and severe mitral regurgitation. There were 3 distinct jets of regurgitation and flow reversal noted in the right-sided pulmonary veins. There remain normal left ventricular systolic function. There was mild left atrial enlargement. No other significant abnormalities were noted. Cardiothoracic surgical consultation was requested.  The patient has been seen in consultation and counseled at length regarding the indications, risks and potential benefits of surgery.  All questions have been answered, and the patient provides full informed consent for the operation as described.    DETAILS OF THE OPERATIVE PROCEDURE  Preparation:  The patient is brought to the operating room on the above mentioned date and central monitoring was established by the anesthesia team including placement of Swan-Ganz catheter through the left internal jugular vein.  A radial arterial line is placed. The patient is placed in the supine position on the operating table.  Intravenous antibiotics are administered. General endotracheal anesthesia is induced uneventfully. The patient is initially intubated using a dual lumen endotracheal tube.  A Foley catheter is placed.  Baseline transesophageal echocardiogram was performed.  Findings were notable for myxomatous degenerative disease of the mitral valve with severe bileaflet prolapse.  There was severe elongation of chordae  tendinae to both leaflets, more pronounced on the posterior leaflet.  There were no ruptured chordae tendinae.  There was severe mitral regurgitation.  There was left atrial enlargement.  There was normal left ventricular size and function.  The aortic valve appeared normal.  Right ventricular size and function was normal.  A soft roll is placed behind the patient's left scapula and the neck gently extended and turned to the left.   The patient's right neck, chest, abdomen, both groins, and both lower extremities are prepared and draped in a sterile manner. A time out procedure is performed.   Percutaneous Vascular Access:  Percutaneous arterial and venous access were obtained on the right side.  Using ultrasound guidance the right common femoral vein was cannulated using the Seldinger technique a pair of Perclose vascular closure devises were placed at opposing 30 degree angles in the femoral vein, after which time an 8 French sheath inserted.  The right common femoral artery was cannulated using a micropuncture wire and sheath.  A pair of Perclose vascular closure devices were placed at opposing 30 degree angles in the femoral artery, and a 8 French sheath inserted.  The right internal jugular vein was cannulated  using ultrasound guidance and an 8 French sheath inserted.     Surgical Approach:  A right miniature anterolateral thoracotomy incision is performed. The incision is placed just lateral to and superior to the right nipple. The pectoralis major muscle is retracted medially and completely preserved. The right pleural space is entered through the 3rd intercostal space. A soft tissue retractor is placed.  Two 11 mm ports are placed through separate stab incisions inferiorly. The right pleural space is insufflated continuously with carbon dioxide gas through the posterior port during the remainder of the operation.  A pledgeted sutures placed through the dome of the right hemidiaphragm and  retracted inferiorly to facilitate exposure.  A longitudinal incision is made in the pericardium 3 cm anterior to the phrenic nerve and silk traction sutures are placed on either side of the incision for exposure.   Extracorporeal Cardiopulmonary Bypass and Myocardial Protection:  The patient was heparinized systemically.  The right common femoral vein is cannulated through the venous sheath and a guidewire advanced into the right atrium using TEE guidance.  The femoral vein cannulated using a 22 Fr long femoral venous cannula.  The right common femoral artery is cannulated through the arterial sheath and a guidewire advanced into the descending thoracic aorta using TEE guidance.  Femoral artery is cannulated with a 18 French femoral arterial cannula.  The right internal jugular vein is cannulated through the venous sheath and a guidewire advanced into the right atrium.  The internal jugular vein is cannulated using a 14 French pediatric femoral venous cannula.   Adequate heparinization is verified.   The entire pre-bypass portion of the operation was notable for stable hemodynamics.  Cardiopulmonary bypass was begun.  Vacuum assist venous drainage is utilized. The incision in the pericardium is extended in both directions. Venous drainage and exposure are notably excellent. A retrograde cardioplegia cannula is placed through the right atrium into the coronary sinus using transesophageal echocardiogram guidance.  An antegrade cardioplegia cannula is placed in the ascending aorta.    The patient is cooled to 32C systemic temperature.  The aortic cross clamp is applied and cardioplegia is delivered initially in an antegrade fashion through the aortic root using modified [place] cold blood cardioplegia ([facility] blood cardioplegia protocol).   The initial cardioplegic arrest is rapid with early diastolic arrest.  Repeat doses of cardioplegia  are administered at 90 minutes and every 30 minutes thereafter  through the coronary sinus catheter in order to maintain completely flat electrocardiogram.  Myocardial protection was felt to be excellent.   Mitral Valve Repair:  A left atriotomy incision was performed through the interatrial groove and extended partially across the back wall of the left atrium after opening the oblique sinus inferiorly.  The mitral valve is exposed using a self-retaining retractor.  The mitral valve was inspected and notable for Barlow's type myxomatous degenerative disease with a very large valve and redundant leaflet tissue.  There was bileaflet prolapse.  Chordae tendinae to both leaflets were elongated.  There were no ruptured chordae tendinae.  Interrupted 2-0 Ethibond horizontal mattress sutures are placed circumferentially around the entire mitral valve annulus. The sutures will ultimately be utilized for ring annuloplasty, and at this juncture there are utilized to suspend the valve symmetrically.  Artificial neochord placement was performed using Chord-X multi-strand CV-4 Goretex pre-measured loops.  The appropriate cord length (22mm) was measured from corresponding normal length primary cords from the P1 segment of the posterior leaflet.  A short length was intentionally chosen to pull the zone of coaptation towards the posterior annulus and shorten all 3 regions of the posterior leaflet.  The papillary muscle suture of a Chord-X multi-strand suture was placed through the head of the anterior papillary muscle in a horizontal mattress fashion and tied over Teflon felt pledgets. Each of the three pre-measured loops were then reimplanted into the free margin of the P1 and P2 segments of the posterior leaflet on the anterior side of midline.  The papillary muscle suture of a second Chord-X multi-strand suture was placed through the head of the posterior papillary muscle in a horizontal mattress fashion and tied over Teflon felt pledgets. Each of the three pre-measured loops were  then reimplanted into the free margin of the P2 and P3 segments of the posterior leaflet on the posterior side of midline.    The valve was tested with saline and appeared competent even without ring annuloplasty complete. The valve was sized to a 40 mm annuloplasty ring, based upon the transverse distance between the left and right commissures and the height of the anterior leaflet, corresponding to a size just slightly larger than the overall surface area of the anterior leaflet.  A Sorin Memo 4D annuloplasty ring (size 60mm, catalog #[id number], serial [id number]) was secured in place uneventfully. All ring sutures were secured using a Cor-knot device.    The valve was tested with saline and appeared competent. There is no residual leak. There was a broad, symmetrical line of coaptation of the anterior and posterior leaflet which was confirmed using the blue ink test.  Rewarming is begun.   Procedure Completion:  The atriotomy was closed using a 2-layer closure of running 3-0 Prolene suture after placing a sump drain across the mitral valve to serve as a left ventricular vent.  One final dose of warm retrograde "reanimation dose" cardioplegia was administered retrograde through the coronary sinus catheter while all air was evacuated through the aortic root.  The aortic cross clamp was removed after a total cross clamp time of 129 minutes.  Epicardial pacing wires are fixed to the right atrial appendage. The patient is rewarmed to 37C temperature. The left ventricular vent and antegrade cardioplegia cannula are removed. The patient is weaned and disconnected from cardiopulmonary bypass.  The patient's rhythm at separation from bypass was AV paced.  The patient was weaned from bypass  without any inotropic support. Total cardiopulmonary bypass time for the operation was 167 minutes.  Followup transesophageal echocardiogram performed after separation from bypass revealed a well-seated annuloplasty ring in the  mitral position with a normal functioning mitral valve. There was no residual leak.  Left ventricular function was unchanged from preoperatively.  The mean gradient across the mitral valve was estimated to be 2-3 mmHg.  The femoral arterial and venous cannulas were removed and all Perclose sutures secured.  Manual pressure was maintained while Protamine was administered.  The right internal jugular cannula was removed and manual pressure held on the neck and groin for 15 minutes.  Single lung ventilation was begun. The atriotomy closure was inspected for hemostasis. The pericardial sac was drained using a 28 French Bard drain placed through the anterior port incision.  The right pleural space is irrigated with saline solution and inspected for hemostasis.   Liposomal bupivacaine was utilized to facilitate postoperative pain control by creating an intercostal nerve block.  The liposomal bupivacaine was injected under direct vision into the intercostal neurovascular bundles posteriorly to cover the second through the sixth intercostal nerve roots.  The right pleural space was drained using a 28 French Bard drain placed through the posterior port incision. The miniature thoracotomy incision was closed in multiple layers in routine fashion. The right groin incision was inspected for hemostasis and closed in multiple layers in routine fashion.  The post-bypass portion of the operation was notable for stable rhythm and hemodynamics.  No blood products were administered during the operation.   Disposition:  The patient tolerated the procedure well.  The patient was extubated in the operating room and subsequently transported to the surgical intensive care unit in stable condition. There were no intraoperative complications. All sponge instrument and needle counts are verified correct at completion of the operation.     Valentina Gu. [name] MD 06/14/2019 1:31 PM

## 2019-06-14 NOTE — Anesthesia Postprocedure Evaluation (Signed)
Anesthesia Post Note  Patient: Alexander [name]  Procedure(s) Performed: MINIMALLY INVASIVE MITRAL VALVE REPAIR (MVR)<TEE (Right Chest) TRANSESOPHAGEAL ECHOCARDIOGRAM (TEE) (N/A )     Patient location during evaluation: SICU Anesthesia Type: General Level of consciousness: awake Pain management: pain level controlled Vital Signs Assessment: post-procedure vital signs reviewed and stable Respiratory status: spontaneous breathing, nonlabored ventilation, respiratory function stable and patient connected to nasal cannula oxygen Cardiovascular status: blood pressure returned to baseline and stable Postop Assessment: no apparent nausea or vomiting Anesthetic complications: no    Last Vitals:  Vitals:   06/14/19 1500 06/14/19 1600  BP: 119/71 108/66  Pulse: 72 65  Resp: 20 17  Temp: (!) 36.1 C (!) 36 C  SpO2: 100% 100%    Last Pain:  Vitals:   06/14/19 1445  TempSrc: Core  PainSc:                  [name],W. EDMOND

## 2019-06-15 ENCOUNTER — Encounter (HOSPITAL_COMMUNITY): Payer: Self-pay | Admitting: Thoracic Surgery (Cardiothoracic Vascular Surgery)

## 2019-06-15 ENCOUNTER — Inpatient Hospital Stay (HOSPITAL_COMMUNITY): Payer: Medicare Other

## 2019-06-15 LAB — GLUCOSE, CAPILLARY
Glucose-Capillary: 101 mg/dL — ABNORMAL HIGH (ref 70–99)
Glucose-Capillary: 110 mg/dL — ABNORMAL HIGH (ref 70–99)
Glucose-Capillary: 112 mg/dL — ABNORMAL HIGH (ref 70–99)
Glucose-Capillary: 125 mg/dL — ABNORMAL HIGH (ref 70–99)
Glucose-Capillary: 125 mg/dL — ABNORMAL HIGH (ref 70–99)
Glucose-Capillary: 91 mg/dL (ref 70–99)

## 2019-06-15 LAB — CBC
HCT: 31.3 % — ABNORMAL LOW (ref 39.0–52.0)
HCT: 33.4 % — ABNORMAL LOW (ref 39.0–52.0)
Hemoglobin: 10.3 g/dL — ABNORMAL LOW (ref 13.0–17.0)
Hemoglobin: 11.1 g/dL — ABNORMAL LOW (ref 13.0–17.0)
MCH: 31.1 pg (ref 26.0–34.0)
MCH: 31.7 pg (ref 26.0–34.0)
MCHC: 32.9 g/dL (ref 30.0–36.0)
MCHC: 33.2 g/dL (ref 30.0–36.0)
MCV: 94.6 fL (ref 80.0–100.0)
MCV: 95.4 fL (ref 80.0–100.0)
Platelets: 103 10*3/uL — ABNORMAL LOW (ref 150–400)
Platelets: 103 10*3/uL — ABNORMAL LOW (ref 150–400)
RBC: 3.31 MIL/uL — ABNORMAL LOW (ref 4.22–5.81)
RBC: 3.5 MIL/uL — ABNORMAL LOW (ref 4.22–5.81)
RDW: 13.2 % (ref 11.5–15.5)
RDW: 13.5 % (ref 11.5–15.5)
WBC: 10.5 10*3/uL (ref 4.0–10.5)
WBC: 16.6 10*3/uL — ABNORMAL HIGH (ref 4.0–10.5)
nRBC: 0 % (ref 0.0–0.2)
nRBC: 0 % (ref 0.0–0.2)

## 2019-06-15 LAB — BASIC METABOLIC PANEL
Anion gap: 8 (ref 5–15)
Anion gap: 8 (ref 5–15)
BUN: 13 mg/dL (ref 8–23)
BUN: 18 mg/dL (ref 8–23)
CO2: 21 mmol/L — ABNORMAL LOW (ref 22–32)
CO2: 23 mmol/L (ref 22–32)
Calcium: 7.4 mg/dL — ABNORMAL LOW (ref 8.9–10.3)
Calcium: 8 mg/dL — ABNORMAL LOW (ref 8.9–10.3)
Chloride: 108 mmol/L (ref 98–111)
Chloride: 103 mmol/L (ref 98–111)
Creatinine, Ser: 0.73 mg/dL (ref 0.61–1.24)
Creatinine, Ser: 1.09 mg/dL (ref 0.61–1.24)
GFR calc Af Amer: >60 mL/min (ref >60)
GFR calc Af Amer: >60 mL/min (ref >60)
GFR calc non Af Amer: >60 mL/min (ref >60)
GFR calc non Af Amer: >60 mL/min (ref >60)
Glucose, Bld: 117 mg/dL — ABNORMAL HIGH (ref 70–99)
Glucose, Bld: 146 mg/dL — ABNORMAL HIGH (ref 70–99)
Potassium: 4.1 mmol/L (ref 3.5–5.1)
Potassium: 4.3 mmol/L (ref 3.5–5.1)
Sodium: 137 mmol/L (ref 135–145)
Sodium: 134 mmol/L — ABNORMAL LOW (ref 135–145)

## 2019-06-15 LAB — MAGNESIUM
Magnesium: 2.5 mg/dL — ABNORMAL HIGH (ref 1.7–2.4)
Magnesium: 2.6 mg/dL — ABNORMAL HIGH (ref 1.7–2.4)

## 2019-06-15 MED ORDER — PNEUMOCOCCAL VAC POLYVALENT 25 MCG/0.5ML IJ INJ
0.5000 mL | INJECTION | INTRAMUSCULAR | Status: AC
  Administered 2019-06-20: 14:00:00 0.5 mL via INTRAMUSCULAR
  Filled 2019-06-15: qty 0.5

## 2019-06-15 MED ORDER — ASPIRIN EC 325 MG PO TBEC
325.0000 mg | DELAYED_RELEASE_TABLET | Freq: Every day | ORAL | Status: AC
  Administered 2019-06-15: 10:00:00 325 mg via ORAL
  Filled 2019-06-15: qty 1

## 2019-06-15 MED ORDER — INSULIN ASPART 100 UNIT/ML ~~LOC~~ SOLN
0.0000 [IU] | SUBCUTANEOUS | Status: DC
  Administered 2019-06-15 – 2019-06-16 (×2): 2 [IU] via SUBCUTANEOUS

## 2019-06-15 MED ORDER — PHENYLEPHRINE HCL-NACL 10-0.9 MG/250ML-% IV SOLN
INTRAVENOUS | Status: AC
  Filled 2019-06-15: qty 250

## 2019-06-15 MED ORDER — ENOXAPARIN SODIUM 30 MG/0.3ML ~~LOC~~ SOLN
30.0000 mg | Freq: Every day | SUBCUTANEOUS | Status: DC
  Administered 2019-06-16 – 2019-06-19 (×4): 30 mg via SUBCUTANEOUS
  Filled 2019-06-15 (×4): qty 0.3

## 2019-06-15 MED ORDER — FUROSEMIDE 10 MG/ML IJ SOLN
20.0000 mg | Freq: Two times a day (BID) | INTRAMUSCULAR | Status: DC
  Administered 2019-06-15 (×2): 20 mg via INTRAVENOUS
  Filled 2019-06-15 (×2): qty 2

## 2019-06-15 MED ORDER — WARFARIN SODIUM 2.5 MG PO TABS
2.5000 mg | ORAL_TABLET | Freq: Every day | ORAL | Status: DC
  Administered 2019-06-15 – 2019-06-16 (×2): 2.5 mg via ORAL
  Filled 2019-06-15 (×2): qty 1

## 2019-06-15 MED ORDER — WARFARIN - PHYSICIAN DOSING INPATIENT: Freq: Every day | Status: DC

## 2019-06-15 MED ORDER — ASPIRIN EC 81 MG PO TBEC
81.0000 mg | DELAYED_RELEASE_TABLET | Freq: Every day | ORAL | Status: DC
  Administered 2019-06-16 – 2019-06-20 (×5): 81 mg via ORAL
  Filled 2019-06-15 (×5): qty 1

## 2019-06-15 MED ORDER — KETOROLAC TROMETHAMINE 15 MG/ML IJ SOLN
15.0000 mg | Freq: Four times a day (QID) | INTRAMUSCULAR | Status: AC
  Administered 2019-06-15 – 2019-06-16 (×4): 15 mg via INTRAVENOUS
  Filled 2019-06-15 (×3): qty 1

## 2019-06-15 NOTE — Plan of Care (Signed)
  Problem: Education: Goal: Knowledge of disease or condition will improve Outcome: Progressing   Problem: Education: Goal: Knowledge of the prescribed therapeutic regimen will improve Outcome: Progressing   Problem: Cardiac: Goal: Will achieve and/or maintain hemodynamic stability Outcome: Progressing   Problem: Respiratory: Goal: Respiratory status will improve Outcome: Progressing   Problem: Urinary Elimination: Goal: Ability to achieve and maintain adequate renal perfusion and functioning will improve Outcome: Progressing

## 2019-06-15 NOTE — Addendum Note (Signed)
Addendum  created 06/15/19 0704 by Josephine Igo, CRNA   Order list changed

## 2019-06-15 NOTE — Progress Notes (Signed)
      [address]       [place] [address]             [phone]        CARDIOTHORACIC SURGERY PROGRESS NOTE   R1 Day Post-Op Procedure(s) (LRB): MINIMALLY INVASIVE MITRAL VALVE REPAIR (MVR)<TEE (Right) TRANSESOPHAGEAL ECHOCARDIOGRAM (TEE) (N/A)  Subjective: Looks great.  Denies pain, SOB.  Objective: Vital signs: BP Readings from Last 1 Encounters:  06/15/19 102/60   Pulse Readings from Last 1 Encounters:  06/15/19 (!) 59   Resp Readings from Last 1 Encounters:  06/15/19 (!) 21   Temp Readings from Last 1 Encounters:  06/15/19 98.5 F (36.9 C) (Oral)    Hemodynamics: PAP: (19-38)/(6-23) 31/16 CO:  [3.4 L/min-5.2 L/min] 4.6 L/min CI:  [1.8 L/min/m2-2.7 L/min/m2] 2.4 L/min/m2  Physical Exam:  Rhythm:   paced  Breath sounds: clear  Heart sounds:  RRR w/out murmur  Incisions:  Dressings dry, intact  Abdomen:  Soft, non-distended, non-tender  Extremities:  Warm, well-perfused  Chest tubes:  low volume thin serosanguinous output, no air leak    Intake/Output from previous day: 04/06 0701 - 04/07 0700 In: 4727.6 [I.V.:3594.2; Blood:300; IV Piggyback:833.5] Out: [id number] [Urine:3365; Chest Tube:390] Intake/Output this shift: Total I/O In: 39.3 [I.V.:39.3] Out: 80 [Urine:40; Chest Tube:40]  Lab Results:  CBC: Recent Labs    06/14/19 2044 06/15/19 0355  WBC 11.7* 10.5  HGB 11.4*  11.2* 10.3*  HCT 34.3*  33.0* 31.3*  PLT 122* 103*    BMET:  Recent Labs    06/14/19 2044 06/15/19 0355  NA 139  140 137  K 4.4  4.3 4.1  CL 110  106 108  CO2 24 21*  GLUCOSE 120*  116* 117*  BUN 13  15 13   CREATININE 0.73  0.60* 0.73  CALCIUM 7.3* 7.4*     PT/INR:   Recent Labs    06/14/19 1426  LABPROT 16.3*  INR 1.3*    CBG (last 3)  Recent Labs    06/15/19 0032 06/15/19 0405 06/15/19 0838  GLUCAP 125* 110* 91    ABG    Component Value Date/Time   PHART 7.304 (L) 06/14/2019 1609   PCO2ART 48.3 (H) 06/14/2019 1609   PO2ART 118.0 (H) 06/14/2019 1609   HCO3 24.2 06/14/2019 1609   TCO2 26 06/14/2019 2044   ACIDBASEDEF 3.0 (H) 06/14/2019 1609   O2SAT 98.0 06/14/2019 1609    CXR: Looks good  Assessment/Plan: S/P Procedure(s) (LRB): MINIMALLY INVASIVE MITRAL VALVE REPAIR (MVR)<TEE (Right) TRANSESOPHAGEAL ECHOCARDIOGRAM (TEE) (N/A)  Doing very well POD1 Maintaining paced rhythm w/ stable BP - no drips Breathing comfortably w/ O2 sats 100% Expected post op acute blood loss anemia, very mild Expected post op volume excess, weight reportedly 6 kg > preop, UOP adequate Expected post op atelectasis, mild Post op thrombocytopenia, very mild   Mobilize  Diuresis  D/C lines  Start Coumadin   [name], MD 06/15/2019 8:50 AM

## 2019-06-15 NOTE — Discharge Instructions (Addendum)
Discharge Instructions:  1. You may shower, please wash incisions daily with soap and water and keep dry.  If you wish to cover wounds with dressing you may do so but please keep clean and change daily.  No tub baths or swimming until incisions have completely healed.  If your incisions become red or develop any drainage please call our office at [phone]  2. No Driving until cleared by Dr. Owen's office and you are no longer using narcotic pain medications  3. Monitor your weight daily.. Please use the same scale and weigh at same time... If you gain 5-10 lbs in 48 hours with associated lower extremity swelling, please contact our office at [phone]  4. Fever of 101.5 for at least 24 hours with no source, please contact our office at [phone]  5. Activity- up as tolerated, please walk at least 3 times per day.  Avoid strenuous activity, no lifting, pushing, or pulling with your arms over 8-10 lbs for a minimum of 6 weeks  6. If any questions or concerns arise, please do not hesitate to contact our office at [phone] Information on my medicine - Coumadin   (Warfarin)   Why was Coumadin prescribed for you? Coumadin was prescribed for you because you have a blood clot or a medical condition that can cause an increased risk of forming blood clots. Blood clots can cause serious health problems by blocking the flow of blood to the heart, lung, or brain. Coumadin can prevent harmful blood clots from forming. As a reminder your indication for Coumadin is:   Blood Clot Prevention After Heart Valve Surgery  What test will check on my response to Coumadin? While on Coumadin (warfarin) you will need to have an INR test regularly to ensure that your dose is keeping you in the desired range. The INR (international normalized ratio) number is calculated from the result of the laboratory test called prothrombin time (PT).  If an INR APPOINTMENT HAS NOT ALREADY BEEN MADE FOR YOU please  schedule an appointment to have this lab work done by your health care provider within 7 days. Your INR goal is usually a number between:  2 to 3 or your provider may give you a more narrow range like 2-2.5.  Ask your health care provider during an office visit what your goal INR is.  What  do you need to  know  About  COUMADIN? Take Coumadin (warfarin) exactly as prescribed by your healthcare provider about the same time each day.  DO NOT stop taking without talking to the doctor who prescribed the medication.  Stopping without other blood clot prevention medication to take the place of Coumadin may increase your risk of developing a new clot or stroke.  Get refills before you run out.  What do you do if you miss a dose? If you miss a dose, take it as soon as you remember on the same day then continue your regularly scheduled regimen the next day.  Do not take two doses of Coumadin at the same time.  Important Safety Information A possible side effect of Coumadin (Warfarin) is an increased risk of bleeding. You should call your healthcare provider right away if you experience any of the following: ? Bleeding from an injury or your nose that does not stop. ? Unusual colored urine (red or dark brown) or unusual colored stools (red or black). ? Unusual bruising for unknown reasons. ? A serious fall or if you hit your head (even if   there is no bleeding).  Some foods or medicines interact with Coumadin (warfarin) and might alter your response to warfarin. To help avoid this: ? Eat a balanced diet, maintaining a consistent amount of Vitamin K. ? Notify your provider about major diet changes you plan to make. ? Avoid alcohol or limit your intake to 1 drink for women and 2 drinks for men per day. (1 drink is 5 oz. wine, 12 oz. beer, or 1.5 oz. liquor.)  Make sure that ANY health care provider who prescribes medication for you knows that you are taking Coumadin (warfarin).  Also make sure the  healthcare provider who is monitoring your Coumadin knows when you have started a new medication including herbals and non-prescription products.  Coumadin (Warfarin)  Major Drug Interactions  Increased Warfarin Effect Decreased Warfarin Effect  Alcohol (large quantities) Antibiotics (esp. Septra/Bactrim, Flagyl, Cipro) Amiodarone (Cordarone) Aspirin (ASA) Cimetidine (Tagamet) Megestrol (Megace) NSAIDs (ibuprofen, naproxen, etc.) Piroxicam (Feldene) Propafenone (Rythmol SR) Propranolol (Inderal) Isoniazid (INH) Posaconazole (Noxafil) Barbiturates (Phenobarbital) Carbamazepine (Tegretol) Chlordiazepoxide (Librium) Cholestyramine (Questran) Griseofulvin Oral Contraceptives Rifampin Sucralfate (Carafate) Vitamin K   Coumadin (Warfarin) Major Herbal Interactions  Increased Warfarin Effect Decreased Warfarin Effect  Garlic Ginseng Ginkgo biloba Coenzyme Q10 Green tea St. John's wort    Coumadin (Warfarin) FOOD Interactions  Eat a consistent number of servings per week of foods HIGH in Vitamin K (1 serving =  cup)  Collards (cooked, or boiled & drained) Kale (cooked, or boiled & drained) Mustard greens (cooked, or boiled & drained) Parsley *serving size only =  cup Spinach (cooked, or boiled & drained) Swiss chard (cooked, or boiled & drained) Turnip greens (cooked, or boiled & drained)  Eat a consistent number of servings per week of foods MEDIUM-HIGH in Vitamin K (1 serving = 1 cup)  Asparagus (cooked, or boiled & drained) Broccoli (cooked, boiled & drained, or raw & chopped) Brussel sprouts (cooked, or boiled & drained) *serving size only =  cup Lettuce, raw (green leaf, endive, romaine) Spinach, raw Turnip greens, raw & chopped   These websites have more information on Coumadin (warfarin):  www.coumadin.com; www.ahrq.gov/consumer/coumadin.htm;    

## 2019-06-15 NOTE — Progress Notes (Signed)
CT surgery p.m. Rounds  Patient resting comfortably sinus rhythm Asking when chest tubes will be removed P.m. labs satisfactory-hemoglobin 11.1, potassium 4.3 Chest tubes with minimal drainage

## 2019-06-16 ENCOUNTER — Inpatient Hospital Stay (HOSPITAL_COMMUNITY): Payer: Medicare Other

## 2019-06-16 LAB — GLUCOSE, CAPILLARY
Glucose-Capillary: 102 mg/dL — ABNORMAL HIGH (ref 70–99)
Glucose-Capillary: 105 mg/dL — ABNORMAL HIGH (ref 70–99)
Glucose-Capillary: 123 mg/dL — ABNORMAL HIGH (ref 70–99)
Glucose-Capillary: 134 mg/dL — ABNORMAL HIGH (ref 70–99)
Glucose-Capillary: 150 mg/dL — ABNORMAL HIGH (ref 70–99)
Glucose-Capillary: 97 mg/dL (ref 70–99)

## 2019-06-16 LAB — CBC
HCT: 31.8 % — ABNORMAL LOW (ref 39.0–52.0)
Hemoglobin: 10.3 g/dL — ABNORMAL LOW (ref 13.0–17.0)
MCH: 31.1 pg (ref 26.0–34.0)
MCHC: 32.4 g/dL (ref 30.0–36.0)
MCV: 96.1 fL (ref 80.0–100.0)
Platelets: 81 10*3/uL — ABNORMAL LOW (ref 150–400)
RBC: 3.31 MIL/uL — ABNORMAL LOW (ref 4.22–5.81)
RDW: 13.5 % (ref 11.5–15.5)
WBC: 11.8 10*3/uL — ABNORMAL HIGH (ref 4.0–10.5)
nRBC: 0 % (ref 0.0–0.2)

## 2019-06-16 LAB — BASIC METABOLIC PANEL
Anion gap: 7 (ref 5–15)
BUN: 21 mg/dL (ref 8–23)
CO2: 25 mmol/L (ref 22–32)
Calcium: 7.7 mg/dL — ABNORMAL LOW (ref 8.9–10.3)
Chloride: 102 mmol/L (ref 98–111)
Creatinine, Ser: 0.93 mg/dL (ref 0.61–1.24)
GFR calc Af Amer: >60 mL/min (ref >60)
GFR calc non Af Amer: >60 mL/min (ref >60)
Glucose, Bld: 132 mg/dL — ABNORMAL HIGH (ref 70–99)
Potassium: 3.9 mmol/L (ref 3.5–5.1)
Sodium: 134 mmol/L — ABNORMAL LOW (ref 135–145)

## 2019-06-16 LAB — PROTIME-INR
INR: 1.2 (ref 0.8–1.2)
Prothrombin Time: 14.9 seconds (ref 11.4–15.2)

## 2019-06-16 MED ORDER — POTASSIUM CHLORIDE 10 MEQ/50ML IV SOLN
10.0000 meq | INTRAVENOUS | Status: AC
  Administered 2019-06-16 (×3): 10 meq via INTRAVENOUS
  Filled 2019-06-16 (×3): qty 50

## 2019-06-16 MED ORDER — FUROSEMIDE 40 MG PO TABS: 40.0000 mg | ORAL_TABLET | Freq: Every day | ORAL | Status: DC

## 2019-06-16 MED ORDER — ~~LOC~~ CARDIAC SURGERY, PATIENT & FAMILY EDUCATION: Freq: Once | Status: DC

## 2019-06-16 MED ORDER — POTASSIUM CHLORIDE CRYS ER 20 MEQ PO TBCR: 20.0000 meq | EXTENDED_RELEASE_TABLET | Freq: Every day | ORAL | Status: DC

## 2019-06-16 MED ORDER — FUROSEMIDE 10 MG/ML IJ SOLN
40.0000 mg | Freq: Two times a day (BID) | INTRAMUSCULAR | Status: AC
  Administered 2019-06-16 (×2): 40 mg via INTRAVENOUS
  Filled 2019-06-16 (×2): qty 4

## 2019-06-16 MED FILL — Potassium Chloride Inj 2 mEq/ML: INTRAVENOUS | Qty: 40 | Status: AC

## 2019-06-16 MED FILL — Heparin Sodium (Porcine) Inj 1000 Unit/ML: INTRAMUSCULAR | Qty: 30 | Status: AC

## 2019-06-16 MED FILL — Lidocaine HCl Local Preservative Free (PF) Inj 2%: INTRAMUSCULAR | Qty: 15 | Status: AC

## 2019-06-16 MED FILL — Electrolyte-R (PH 7.4) Solution: INTRAVENOUS | Qty: 4000 | Status: AC

## 2019-06-16 MED FILL — Mannitol IV Soln 20%: INTRAVENOUS | Qty: 500 | Status: AC

## 2019-06-16 MED FILL — Sodium Chloride IV Soln 0.9%: INTRAVENOUS | Qty: 2000 | Status: AC

## 2019-06-16 MED FILL — Sodium Bicarbonate IV Soln 8.4%: INTRAVENOUS | Qty: 50 | Status: AC

## 2019-06-16 NOTE — Progress Notes (Signed)
CARDIAC REHAB PHASE I   PRE:  Rate/Rhythm: 69 paced  BP:  Supine:   Sitting: 101/58  Standing:    SaO2: 98%RA  MODE:  Ambulation: 370 ft   POST:  Rate/Rhythm: 78 paced  BP:  Supine:   Sitting: 104/52  Standing:    SaO2: 93-95%RA 1325-1355 Pt walked 370 ft on RA with EVA and asst x 2. Tolerated well. Stated a little lightheaded at end of walk but not enough to affect balance. Back to recliner with call bell.    Graylon Good, RN BSN  06/16/2019 1:52 PM

## 2019-06-16 NOTE — Plan of Care (Signed)
  Problem: Clinical Measurements: Goal: Ability to maintain clinical measurements within normal limits will improve Outcome: Progressing Goal: Respiratory complications will improve Outcome: Progressing Goal: Cardiovascular complication will be avoided Outcome: Progressing   Problem: Activity: Goal: Risk for activity intolerance will decrease Outcome: Progressing   Problem: Nutrition: Goal: Adequate nutrition will be maintained Outcome: Progressing   Problem: Pain Managment: Goal: General experience of comfort will improve Outcome: Progressing   Problem: Safety: Goal: Ability to remain free from injury will improve Outcome: Progressing

## 2019-06-16 NOTE — Progress Notes (Signed)
Patient AAI paced at a rate of 70 per order of Dr [name].

## 2019-06-16 NOTE — Progress Notes (Addendum)
TCTS DAILY ICU PROGRESS NOTE                   [address]            [place] [address]          [phone]   2 Days Post-Op Procedure(s) (LRB): MINIMALLY INVASIVE MITRAL VALVE REPAIR (MVR)<TEE (Right) TRANSESOPHAGEAL ECHOCARDIOGRAM (TEE) (N/A)  Total Length of Stay:  LOS: 2 days   Subjective: Sitting up in the bedside chair, says he feels good. Pain is well controlled, no shortness of breath.   Currently on RA, O2 sat 98%.   Objective: Vital signs in last 24 hours: Temp:  [97.5 F (36.4 C)-99.2 F (37.3 C)] 98.7 F (37.1 C) (04/08 0738) Pulse Rate:  [59-64] 64 (04/07 1800) Cardiac Rhythm: Ventricular paced;Normal sinus rhythm (04/08 0400) Resp:  [10-25] 18 (04/08 0000) BP: (83-109)/(48-67) 103/53 (04/08 0400) SpO2:  [90 %-100 %] 95 % (04/08 0400) Arterial Line BP: (112)/(37) 112/37 (04/07 0900) Weight:  [82.4 kg] 82.4 kg (04/08 0500)  Filed Weights   06/14/19 0611 06/15/19 0500 06/16/19 0500  Weight: 77.1 kg 83.2 kg 82.4 kg    Weight change: -0.8 kg    Intake/Output from previous day: 04/07 0701 - 04/08 0700 In: 305.4 [I.V.:205.2; IV Piggyback:100.1] Out: 1345 [Urine:975; Chest Tube:370]  Intake/Output this shift: No intake/output data recorded.  Current Meds: Scheduled Meds: . acetaminophen  1,000 mg Oral Q6H  . aspirin EC  81 mg Oral Daily  . bisacodyl  10 mg Oral Daily   Or  . bisacodyl  10 mg Rectal Daily  . Chlorhexidine Gluconate Cloth  6 each Topical Daily  . [facility], Patient & Family Education   Does not apply Once  . docusate sodium  200 mg Oral Daily  . enoxaparin (LOVENOX) injection  30 mg Subcutaneous QHS  . furosemide  40 mg Intravenous BID  . [START ON 06/17/2019] furosemide  40 mg Oral Daily  . insulin aspart  0-24 Units Subcutaneous Q4H  . ketorolac  15 mg Intravenous Q6H  . pantoprazole  40 mg Oral Daily  . pneumococcal 23 valent vaccine  0.5 mL Intramuscular Tomorrow-1000  . [START ON 06/17/2019]  potassium chloride  20 mEq Oral Daily  . sodium chloride flush  10-40 mL Intracatheter Q12H  . sodium chloride flush  3 mL Intravenous Q12H  . warfarin  2.5 mg Oral q1600  . Warfarin - Physician Dosing Inpatient   Does not apply q1600   Continuous Infusions: . sodium chloride    . lactated ringers    . lactated ringers 10 mL/hr at 06/15/19 1800  . potassium chloride     PRN Meds:.metoprolol tartrate, morphine injection, ondansetron (ZOFRAN) IV, sodium chloride flush, sodium chloride flush, traMADol  General appearance: alert, cooperative and no distress Neurologic: intact Heart: NSR Lungs: Breath sounds are clear. CT drainage 369ml past 24 hours.  Extremities: all well perfused, no edema Wound:the chest incision is covered with a dry dressing.   Lab Results: CBC: Recent Labs    06/15/19 1640 06/16/19 0244  WBC 16.6* 11.8*  HGB 11.1* 10.3*  HCT 33.4* 31.8*  PLT 103* 81*   BMET:  Recent Labs    06/15/19 1640 06/16/19 0244  NA 134* 134*  K 4.3 3.9  CL 103 102  CO2 23 25  GLUCOSE 146* 132*  BUN 18 21  CREATININE 1.09 0.93  CALCIUM 8.0* 7.7*    CMET: Lab Results  Component  Value Date   WBC 11.8 (H) 06/16/2019   HGB 10.3 (L) 06/16/2019   HCT 31.8 (L) 06/16/2019   PLT 81 (L) 06/16/2019   GLUCOSE 132 (H) 06/16/2019   CHOL 169 02/10/2019   TRIG 80 02/10/2019   HDL 81 02/10/2019   LDLCALC 73 02/10/2019   ALT 18 06/10/2019   AST 26 06/10/2019   NA 134 (L) 06/16/2019   K 3.9 06/16/2019   CL 102 06/16/2019   CREATININE 0.93 06/16/2019   BUN 21 06/16/2019   CO2 25 06/16/2019   TSH 1.310 02/10/2019   INR 1.2 06/16/2019   HGBA1C 5.3 06/10/2019      PT/INR:  Recent Labs    06/16/19 0244  LABPROT 14.9  INR 1.2   Radiology: No results found.   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  Hemodynamically stable and maintaining SR.  Coumadin started, INR 1.2. Plan transfer to 4E [facility] today. Continue to advance activity, diet.   -Mild Expected acute blood loss anemia- stable, monitor.  -Thrombocytopenia- Plt count 81,000 and trending down slowly. Monitor.   -DVT PPX- On Lovenox 30mg  [place] daily. Consider holding if plt count declines further.   - Volume excess- not significantly edematous on exam but Wt +5kg from pre-op . Continue Lasix.     Antony Odea, PA-C [phone] 06/16/2019 8:12 AM   I have seen and examined the patient and agree with the assessment and plan as outlined.  Doing very well POD2.  Mobilize.  Diuresis for expected post op volume excess.  D/C central line.  Leave chest tubes at least 1 more day.  Transfer 4E  [name], MD 06/16/2019 11:17 AM

## 2019-06-16 NOTE — Progress Notes (Signed)
Patient ID: Alexander [name], male   DOB: August 26, 1940, 79 y.o.   MRN: [id number] EVENING ROUNDS NOTE :     [address]       [place] [address]             [phone]                 2 Days Post-Op Procedure(s) (LRB): MINIMALLY INVASIVE MITRAL VALVE REPAIR (MVR)<TEE (Right) TRANSESOPHAGEAL ECHOCARDIOGRAM (TEE) (N/A)  Total Length of Stay:  LOS: 2 days  BP 102/61 (BP Location: Left Arm)   Pulse 66   Temp 98.2 F (36.8 C)   Resp 18   Ht 5' 9.5" (1.765 m)   Wt 82.4 kg   SpO2 96%   BMI 26.44 kg/m   .Intake/Output      04/07 0701 - 04/08 0700 04/08 0701 - 04/09 0700   I.V. (mL/kg) 205.2 (2.5)    Blood     IV Piggyback 100.1    Total Intake(mL/kg) 305.4 (3.7)    Urine (mL/kg/hr) 975 (0.5) 340 (0.4)   Chest Tube 370 80   Total Output 1345 420   Net -1039.6 -420          . sodium chloride    . lactated ringers    . lactated ringers 10 mL/hr at 06/15/19 1800     Lab Results  Component Value Date   WBC 11.8 (H) 06/16/2019   HGB 10.3 (L) 06/16/2019   HCT 31.8 (L) 06/16/2019   PLT 81 (L) 06/16/2019   GLUCOSE 132 (H) 06/16/2019   CHOL 169 02/10/2019   TRIG 80 02/10/2019   HDL 81 02/10/2019   LDLCALC 73 02/10/2019   ALT 18 06/10/2019   AST 26 06/10/2019   NA 134 (L) 06/16/2019   K 3.9 06/16/2019   CL 102 06/16/2019   CREATININE 0.93 06/16/2019   BUN 21 06/16/2019   CO2 25 06/16/2019   TSH 1.310 02/10/2019   INR 1.2 06/16/2019   HGBA1C 5.3 06/10/2019   stagble day plts low    Grace Isaac MD  Beeper [phone] Office [phone] 06/16/2019 4:52 PM

## 2019-06-17 LAB — BASIC METABOLIC PANEL
Anion gap: 9 (ref 5–15)
BUN: 19 mg/dL (ref 8–23)
CO2: 25 mmol/L (ref 22–32)
Calcium: 8.1 mg/dL — ABNORMAL LOW (ref 8.9–10.3)
Chloride: 102 mmol/L (ref 98–111)
Creatinine, Ser: 1.03 mg/dL (ref 0.61–1.24)
GFR calc Af Amer: >60 mL/min (ref >60)
GFR calc non Af Amer: >60 mL/min (ref >60)
Glucose, Bld: 104 mg/dL — ABNORMAL HIGH (ref 70–99)
Potassium: 3.6 mmol/L (ref 3.5–5.1)
Sodium: 136 mmol/L (ref 135–145)

## 2019-06-17 LAB — POCT I-STAT 7, (LYTES, BLD GAS, ICA,H+H)
Acid-Base Excess: 2 mmol/L (ref 0.0–2.0)
Acid-Base Excess: 3 mmol/L — ABNORMAL HIGH (ref 0.0–2.0)
Acid-Base Excess: 3 mmol/L — ABNORMAL HIGH (ref 0.0–2.0)
Acid-base deficit: 1 mmol/L (ref 0.0–2.0)
Bicarbonate: 29.6 mmol/L — ABNORMAL HIGH (ref 20.0–28.0)
Bicarbonate: 28.6 mmol/L — ABNORMAL HIGH (ref 20.0–28.0)
Bicarbonate: 28 mmol/L (ref 20.0–28.0)
Bicarbonate: 24.2 mmol/L (ref 20.0–28.0)
Calcium, Ion: 1.3 mmol/L (ref 1.15–1.40)
Calcium, Ion: 1.05 mmol/L — ABNORMAL LOW (ref 1.15–1.40)
Calcium, Ion: 1.13 mmol/L — ABNORMAL LOW (ref 1.15–1.40)
Calcium, Ion: 1.1 mmol/L — ABNORMAL LOW (ref 1.15–1.40)
HCT: 37 % — ABNORMAL LOW (ref 39.0–52.0)
HCT: 30 % — ABNORMAL LOW (ref 39.0–52.0)
HCT: 30 % — ABNORMAL LOW (ref 39.0–52.0)
HCT: 30 % — ABNORMAL LOW (ref 39.0–52.0)
Hemoglobin: 12.6 g/dL — ABNORMAL LOW (ref 13.0–17.0)
Hemoglobin: 10.2 g/dL — ABNORMAL LOW (ref 13.0–17.0)
Hemoglobin: 10.2 g/dL — ABNORMAL LOW (ref 13.0–17.0)
Hemoglobin: 10.2 g/dL — ABNORMAL LOW (ref 13.0–17.0)
O2 Saturation: 100 %
O2 Saturation: 100 %
O2 Saturation: 100 %
O2 Saturation: 99 %
Potassium: 4.2 mmol/L (ref 3.5–5.1)
Potassium: 4.4 mmol/L (ref 3.5–5.1)
Potassium: 4.7 mmol/L (ref 3.5–5.1)
Potassium: 4 mmol/L (ref 3.5–5.1)
Sodium: 139 mmol/L (ref 135–145)
Sodium: 141 mmol/L (ref 135–145)
Sodium: 139 mmol/L (ref 135–145)
Sodium: 139 mmol/L (ref 135–145)
TCO2: 31 mmol/L (ref 22–32)
TCO2: 30 mmol/L (ref 22–32)
TCO2: 29 mmol/L (ref 22–32)
TCO2: 25 mmol/L (ref 22–32)
pCO2 arterial: 56.3 mmHg — ABNORMAL HIGH (ref 32.0–48.0)
pCO2 arterial: 50 mmHg — ABNORMAL HIGH (ref 32.0–48.0)
pCO2 arterial: 44.8 mmHg (ref 32.0–48.0)
pCO2 arterial: 40.9 mmHg (ref 32.0–48.0)
pH, Arterial: 7.329 — ABNORMAL LOW (ref 7.350–7.450)
pH, Arterial: 7.365 (ref 7.350–7.450)
pH, Arterial: 7.405 (ref 7.350–7.450)
pH, Arterial: 7.38 (ref 7.350–7.450)
pO2, Arterial: 561 mmHg — ABNORMAL HIGH (ref 83.0–108.0)
pO2, Arterial: 424 mmHg — ABNORMAL HIGH (ref 83.0–108.0)
pO2, Arterial: 363 mmHg — ABNORMAL HIGH (ref 83.0–108.0)
pO2, Arterial: 155 mmHg — ABNORMAL HIGH (ref 83.0–108.0)

## 2019-06-17 LAB — GLUCOSE, CAPILLARY
Glucose-Capillary: 109 mg/dL — ABNORMAL HIGH (ref 70–99)
Glucose-Capillary: 137 mg/dL — ABNORMAL HIGH (ref 70–99)
Glucose-Capillary: 90 mg/dL (ref 70–99)
Glucose-Capillary: 91 mg/dL (ref 70–99)
Glucose-Capillary: 95 mg/dL (ref 70–99)
Glucose-Capillary: 96 mg/dL (ref 70–99)
Glucose-Capillary: 99 mg/dL (ref 70–99)

## 2019-06-17 LAB — POCT I-STAT, CHEM 8
BUN: 16 mg/dL (ref 8–23)
BUN: 14 mg/dL (ref 8–23)
BUN: 16 mg/dL (ref 8–23)
BUN: 14 mg/dL (ref 8–23)
BUN: 14 mg/dL (ref 8–23)
BUN: 14 mg/dL (ref 8–23)
Calcium, Ion: 1.32 mmol/L (ref 1.15–1.40)
Calcium, Ion: 1.04 mmol/L — ABNORMAL LOW (ref 1.15–1.40)
Calcium, Ion: 1.16 mmol/L (ref 1.15–1.40)
Calcium, Ion: 1.12 mmol/L — ABNORMAL LOW (ref 1.15–1.40)
Calcium, Ion: 1.09 mmol/L — ABNORMAL LOW (ref 1.15–1.40)
Calcium, Ion: 1.1 mmol/L — ABNORMAL LOW (ref 1.15–1.40)
Chloride: 104 mmol/L (ref 98–111)
Chloride: 102 mmol/L (ref 98–111)
Chloride: 105 mmol/L (ref 98–111)
Chloride: 104 mmol/L (ref 98–111)
Chloride: 106 mmol/L (ref 98–111)
Chloride: 104 mmol/L (ref 98–111)
Creatinine, Ser: 0.7 mg/dL (ref 0.61–1.24)
Creatinine, Ser: 0.6 mg/dL — ABNORMAL LOW (ref 0.61–1.24)
Creatinine, Ser: 0.7 mg/dL (ref 0.61–1.24)
Creatinine, Ser: 0.6 mg/dL — ABNORMAL LOW (ref 0.61–1.24)
Creatinine, Ser: 0.5 mg/dL — ABNORMAL LOW (ref 0.61–1.24)
Creatinine, Ser: 0.6 mg/dL — ABNORMAL LOW (ref 0.61–1.24)
Glucose, Bld: 91 mg/dL (ref 70–99)
Glucose, Bld: 85 mg/dL (ref 70–99)
Glucose, Bld: 91 mg/dL (ref 70–99)
Glucose, Bld: 128 mg/dL — ABNORMAL HIGH (ref 70–99)
Glucose, Bld: 134 mg/dL — ABNORMAL HIGH (ref 70–99)
Glucose, Bld: 109 mg/dL — ABNORMAL HIGH (ref 70–99)
HCT: 38 % — ABNORMAL LOW (ref 39.0–52.0)
HCT: 33 % — ABNORMAL LOW (ref 39.0–52.0)
HCT: 36 % — ABNORMAL LOW (ref 39.0–52.0)
HCT: 32 % — ABNORMAL LOW (ref 39.0–52.0)
HCT: 28 % — ABNORMAL LOW (ref 39.0–52.0)
HCT: 33 % — ABNORMAL LOW (ref 39.0–52.0)
Hemoglobin: 12.9 g/dL — ABNORMAL LOW (ref 13.0–17.0)
Hemoglobin: 11.2 g/dL — ABNORMAL LOW (ref 13.0–17.0)
Hemoglobin: 12.2 g/dL — ABNORMAL LOW (ref 13.0–17.0)
Hemoglobin: 10.9 g/dL — ABNORMAL LOW (ref 13.0–17.0)
Hemoglobin: 9.5 g/dL — ABNORMAL LOW (ref 13.0–17.0)
Hemoglobin: 11.2 g/dL — ABNORMAL LOW (ref 13.0–17.0)
Potassium: 4.2 mmol/L (ref 3.5–5.1)
Potassium: 4.4 mmol/L (ref 3.5–5.1)
Potassium: 4.4 mmol/L (ref 3.5–5.1)
Potassium: 4.7 mmol/L (ref 3.5–5.1)
Potassium: 4.8 mmol/L (ref 3.5–5.1)
Potassium: 4 mmol/L (ref 3.5–5.1)
Sodium: 140 mmol/L (ref 135–145)
Sodium: 139 mmol/L (ref 135–145)
Sodium: 141 mmol/L (ref 135–145)
Sodium: 139 mmol/L (ref 135–145)
Sodium: 139 mmol/L (ref 135–145)
Sodium: 139 mmol/L (ref 135–145)
TCO2: 30 mmol/L (ref 22–32)
TCO2: 28 mmol/L (ref 22–32)
TCO2: 29 mmol/L (ref 22–32)
TCO2: 28 mmol/L (ref 22–32)
TCO2: 28 mmol/L (ref 22–32)
TCO2: 24 mmol/L (ref 22–32)

## 2019-06-17 LAB — CBC
HCT: 32.6 % — ABNORMAL LOW (ref 39.0–52.0)
Hemoglobin: 10.7 g/dL — ABNORMAL LOW (ref 13.0–17.0)
MCH: 31 pg (ref 26.0–34.0)
MCHC: 32.8 g/dL (ref 30.0–36.0)
MCV: 94.5 fL (ref 80.0–100.0)
Platelets: 89 10*3/uL — ABNORMAL LOW (ref 150–400)
RBC: 3.45 MIL/uL — ABNORMAL LOW (ref 4.22–5.81)
RDW: 13.2 % (ref 11.5–15.5)
WBC: 9.1 10*3/uL (ref 4.0–10.5)
nRBC: 0 % (ref 0.0–0.2)

## 2019-06-17 LAB — PROTIME-INR
INR: 1.2 (ref 0.8–1.2)
Prothrombin Time: 15 seconds (ref 11.4–15.2)

## 2019-06-17 MED ORDER — FUROSEMIDE 10 MG/ML IJ SOLN
40.0000 mg | Freq: Two times a day (BID) | INTRAMUSCULAR | Status: AC
  Administered 2019-06-17 (×2): 40 mg via INTRAVENOUS
  Filled 2019-06-17 (×2): qty 4

## 2019-06-17 MED ORDER — AMIODARONE HCL IN DEXTROSE 360-4.14 MG/200ML-% IV SOLN
60.0000 mg/h | INTRAVENOUS | Status: DC
  Administered 2019-06-17: 22:00:00 60 mg/h via INTRAVENOUS
  Filled 2019-06-17: qty 200

## 2019-06-17 MED ORDER — AMIODARONE HCL IN DEXTROSE 360-4.14 MG/200ML-% IV SOLN
30.0000 mg/h | INTRAVENOUS | Status: DC
  Administered 2019-06-17 – 2019-06-18 (×3): 30 mg/h via INTRAVENOUS
  Filled 2019-06-17 (×2): qty 200

## 2019-06-17 MED ORDER — WARFARIN SODIUM 5 MG PO TABS
5.0000 mg | ORAL_TABLET | Freq: Every day | ORAL | Status: DC
  Administered 2019-06-17 – 2019-06-19 (×3): 5 mg via ORAL
  Filled 2019-06-17 (×3): qty 1

## 2019-06-17 MED ORDER — AMIODARONE LOAD VIA INFUSION
150.0000 mg | Freq: Once | INTRAVENOUS | Status: AC
  Administered 2019-06-17: 18:00:00 150 mg via INTRAVENOUS
  Filled 2019-06-17: qty 83.34

## 2019-06-17 MED ORDER — POTASSIUM CHLORIDE CRYS ER 20 MEQ PO TBCR
40.0000 meq | EXTENDED_RELEASE_TABLET | Freq: Every day | ORAL | Status: AC
  Administered 2019-06-17 – 2019-06-20 (×4): 40 meq via ORAL
  Filled 2019-06-17 (×4): qty 2

## 2019-06-17 MED ORDER — FUROSEMIDE 40 MG PO TABS
40.0000 mg | ORAL_TABLET | Freq: Every day | ORAL | Status: AC
  Administered 2019-06-18 – 2019-06-20 (×3): 40 mg via ORAL
  Filled 2019-06-17 (×3): qty 1

## 2019-06-17 MED ORDER — AMIODARONE HCL IN DEXTROSE 360-4.14 MG/200ML-% IV SOLN
INTRAVENOUS | Status: AC
  Administered 2019-06-17: 60 mg/h via INTRAVENOUS
  Filled 2019-06-17: qty 200

## 2019-06-17 MED ORDER — POTASSIUM CHLORIDE 10 MEQ/50ML IV SOLN
10.0000 meq | INTRAVENOUS | Status: AC
  Administered 2019-06-17 (×3): 10 meq via INTRAVENOUS
  Filled 2019-06-17 (×3): qty 50

## 2019-06-17 NOTE — Progress Notes (Addendum)
TCTS DAILY ICU PROGRESS NOTE                   [address]            [place] [address]          [phone]   3 Days Post-Op Procedure(s) (LRB): MINIMALLY INVASIVE MITRAL VALVE REPAIR (MVR)<TEE (Right) TRANSESOPHAGEAL ECHOCARDIOGRAM (TEE) (N/A)  Total Length of Stay:  LOS: 3 days   Subjective:  Awake and alert, eating breakfast.  No complaints of concerns. Progressing with mobility. No BM yet.  IV KCl infusing per replacement protocol.   Objective: Vital signs in last 24 hours: Temp:  [97.8 F (36.6 C)-98.6 F (37 C)] 98.6 F (37 C) (04/09 0400) Pulse Rate:  [64-68] 68 (04/08 1900) Cardiac Rhythm: Ventricular paced;Normal sinus rhythm (04/09 0400) Resp:  [16-18] 17 (04/09 0400) BP: (87-114)/(49-62) 110/62 (04/09 0600) SpO2:  [95 %-97 %] 95 % (04/09 0400) Weight:  [84.2 kg] 84.2 kg (04/09 0500)  Filed Weights   06/15/19 0500 06/16/19 0500 06/17/19 0500  Weight: 83.2 kg 82.4 kg 84.2 kg    Weight change: 1.8 kg   Hemodynamic parameters for last 24 hours:    Intake/Output from previous day: 04/08 0701 - 04/09 0700 In: -  Out: 2140 [Urine:1840; Chest Tube:300]  Intake/Output this shift: No intake/output data recorded.  Current Meds: Scheduled Meds: . acetaminophen  1,000 mg Oral Q6H  . aspirin EC  81 mg Oral Daily  . bisacodyl  10 mg Oral Daily   Or  . bisacodyl  10 mg Rectal Daily  . Chlorhexidine Gluconate Cloth  6 each Topical Daily  . [facility], Patient & Family Education   Does not apply Once  . docusate sodium  200 mg Oral Daily  . enoxaparin (LOVENOX) injection  30 mg Subcutaneous QHS  . furosemide  40 mg Intravenous BID  . [START ON 06/18/2019] furosemide  40 mg Oral Daily  . pantoprazole  40 mg Oral Daily  . pneumococcal 23 valent vaccine  0.5 mL Intramuscular Tomorrow-1000  . potassium chloride  20 mEq Oral Daily  . sodium chloride flush  10-40 mL Intracatheter Q12H  . sodium chloride flush  3 mL Intravenous  Q12H  . warfarin  2.5 mg Oral q1600  . Warfarin - Physician Dosing Inpatient   Does not apply q1600   Continuous Infusions: . sodium chloride    . lactated ringers    . lactated ringers 10 mL/hr at 06/15/19 1800  . potassium chloride 10 mEq (06/17/19 0806)   PRN Meds:.metoprolol tartrate, morphine injection, ondansetron (ZOFRAN) IV, sodium chloride flush, sodium chloride flush, traMADol  Physical Exam: General appearance: alert, cooperative and no distress Neurologic: intact Heart: NSR Lungs: Breath sounds are clear. CT drainage 365ml past 24 hours, 81ml past 12 hours. .  Extremities: all well perfused, no edema Wound:the chest incision is covered with a dry dressing.   Lab Results: CBC: Recent Labs    06/16/19 0244 06/17/19 0532  WBC 11.8* 9.1  HGB 10.3* 10.7*  HCT 31.8* 32.6*  PLT 81* 89*   BMET:  Recent Labs    06/16/19 0244 06/17/19 0532  NA 134* 136  K 3.9 3.6  CL 102 102  CO2 25 25  GLUCOSE 132* 104*  BUN 21 19  CREATININE 0.93 1.03  CALCIUM 7.7* 8.1*    CMET: Lab Results  Component Value Date   WBC 9.1 06/17/2019   HGB 10.7 (L) 06/17/2019  HCT 32.6 (L) 06/17/2019   PLT 89 (L) 06/17/2019   GLUCOSE 104 (H) 06/17/2019   CHOL 169 02/10/2019   TRIG 80 02/10/2019   HDL 81 02/10/2019   LDLCALC 73 02/10/2019   ALT 18 06/10/2019   AST 26 06/10/2019   NA 136 06/17/2019   K 3.6 06/17/2019   CL 102 06/17/2019   CREATININE 1.03 06/17/2019   BUN 19 06/17/2019   CO2 25 06/17/2019   TSH 1.310 02/10/2019   INR 1.2 06/17/2019   HGBA1C 5.3 06/10/2019      PT/INR:  Recent Labs    06/17/19 0532  LABPROT 15.0  INR 1.2   Radiology: No results found.   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 Hemodynamically stable and maintaining SR. Moderate CT drainage is gradually decreasing.  Will  leave in place another day.   Coumadin started, INR remains at 1.2. Coumadin 5mg  po tonight. D/C pacer wires Plan transfer to 4E [facility] when bed available. Continue to advance activity, diet.   -Mild Expected acute blood loss anemia- stable, monitor.  -Thrombocytopenia- Plt count 89,000 and trending back up. Monitor.   -DVT PPX- On Lovenox 30mg  [place] daily.   - Volume excess- not significantly edematous on exam  Wt remains +5kg from pre-op . Continue Lasix.      Antony Odea, PA-C [phone] 06/17/2019 8:15 AM   I have seen and examined the patient and agree with the assessment and plan as outlined.  Doing well.  Leave chest tubes in at least 1 more day.  Mobilize.  Diuresis.  Transfer 4E.  Anticipate likely d/c home 2-3 days.  [name], MD 06/17/2019 8:42 AM

## 2019-06-17 NOTE — Progress Notes (Signed)
CARDIAC REHAB PHASE I   PRE:  Rate/Rhythm: 75 SR  BP:  Supine:   Sitting: 119/65  Standing:    SaO2: 96%RA  MODE:  Ambulation: 500 ft   POST:  Rate/Rhythm: 92 SR  BP:  Supine:   Sitting: 128/58  Standing:    SaO2: 97%RA 1350-1415 Pt in more pain today than yesterday. Pt walked 500 ft on RA with rolling walker and asst x 1 with steady gait. Has walker at home if needed. To bed after walk at pt's request. Feels tired and cold today. Put two heated blankets on pt and RN aware of request for tylenol when he can receive it. Call bell in reach.    Graylon Good, RN BSN  06/17/2019 2:10 PM

## 2019-06-17 NOTE — Discharge Summary (Signed)
Physician Discharge Summary  Patient ID: Alexander [name] MRN: [id number] DOB/AGE: 1940/08/10 79 y.o.  Admit date: 06/14/2019 Discharge date: 06/20/19  Admission Diagnoses:  Severe mitral insufficiency Mitral valve prolapse Dyslipidemia History of right bundle branch block Hypertension Osteoarthritis History of sinus node dysfunction, s/p permanent pacemaker placement   Discharge Diagnoses:   S/P minimally invasive mitral valve repair Severe mitral insufficiency Mitral valve prolapse Dyslipidemia History of right bundle branch block Hypertension Osteoarthritis History of sinus node dysfunction, s/p permanent pacemaker placement Post-operative atrial fibrillation with resolution to NSR   Discharged Condition: stable  History of Present Illness:  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   Patient states that he has known of a presence of a heart murmur and mitral valve prolapse for many years.  For a long time he was followed by Dr. Mare Ferrari, and more recently he has been followed by Dr. [name].  He has remained physically very active all of his adult life and exercises on a regular basis.  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  He underwent permanent pacemaker placement and has done well ever since.  Over the last several months the patient has developed new onset of exertional shortness of breath.  Symptoms only occur with more strenuous exertion, although the patient had 1 or 2 episodes of severe shortness of breath following strenuous  exertion that was slow to recover.  Despite this he has still remained active and he continues to exercise on a regular basis.  He was seen in follow-up recently by Dr. [name] and noted to have a more prominent systolic murmur on physical exam.  Follow-up transthoracic echocardiogram revealed normal left ventricular systolic function with at least moderate mitral regurgitation.  TEE was performed May 23, 2019 confirmed the presence of myxomatous degenerative disease with bileaflet prolapse and severe mitral regurgitation.  There were 3 distinct jets of regurgitation and flow reversal noted in the right-sided pulmonary veins.  There remain normal left ventricular systolic function.  There was mild left atrial enlargement.  No other significant abnormalities were noted.  Cardiothoracic surgical consultation was requested.   Patient is married and lives locally in [place] with his wife.  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  He remains quite active physically.  He enjoys golf and exercises on a regular basis.  He reports several episodes of exertional shortness of breath that have developed only following more strenuous physical exertion.  He denies shortness of breath with ordinary level activity or at rest.  He has not had PND, orthopnea, nor lower extremity edema.  He has not had tachypalpitations, dizzy spells, nor syncope.  He does admit to the development of mild short-term memory loss over the last year or so.  This has been described as annoying but not problematic from a practical standpoint and the patient remains entirely functionally independent.  Hospital Course:   Patient underwent elective mitral valve repair via right minithoracotomy approach on June 14, 2019.  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  The procedure was  uncomplicated and there was no residual mitral regurgitation on follow-up TEE after separation from bypass.  Patient was extubated in the operating room and transferred to the cardiovascular ICU where he remained hemodynamically stable. By the first post-operative day he was requiring no vasoactive drips.He was mobilized early postoperatively and made appropriate progress. He remained in a stable sinus rhythm. Pacer wires were removed on the 4th post-op day and he was transferred to 4E Progressive Care. Chest tubes were left in place until drainage was minimal. Patient developed post-op atrial fibrillation and was started on amiodarone resulting in prompt conversion back to sinus rhythm. The amiodarone was converted to PO.  He otherwise made a progressive and uneventful recovery.  He regained independence with his mobility.  He was weaned from the supplemental oxygen without difficulty.  He was tolerating regular diet with at the time of discharge and having appropriate bowel bladder function.  His incisions were healing with no sign of complication.  He was anticoagulated with Coumadin postoperatively with an appropriate rise in his prothrombin time to 1.9 by the day of discharge.  At that point, he was taking Coumadin 3 mg p.o. daily.   Consults: None  Significant Diagnostic Studies:    ECHOCARDIOGRAM LIMITED REPORT         Patient Name:   Alexander [name] Date of Exam: 03/14/2019  Medical Rec #:  [id number]          Height:       69.0 in  Accession #:    [id number]         Weight:       165.0 lb  Date of Birth:  April 10, 1940          BSA:          1.90 m  Patient Age:    79 years           BP:           122/50 mmHg  Patient Gender: M                  HR:           71 bpm.  Exam Location:  [address]      Procedure: Limited Echo, Cardiac Doppler and Color Doppler   Indications:    R06.02 Shortness of breath. I34.1 MVP. LIMITED to evaluate  MVP.     History:        Patient has prior  history of Echocardiogram examinations,  most                  recent 02/10/2019.     Sonographer:    Jessee Avers, RDCS  Referring Phys: Candee Furbish MD   IMPRESSIONS     1. Left ventricular ejection fraction, by visual estimation, is 55 to  60%. There is no increased left ventricular wall thickness.   2. Left ventricular diastolic parameters are consistent with Grade I  diastolic dysfunction (impaired relaxation).   3. Mild to moderately dilated left ventricular internal cavity size.   4. Global right ventricle has normal systolc function.The right  ventricular size is normal. no increase in right ventricular wall  thickness.   5. Moderate mitral valve prolapse.   6. Moderate thickening of the mitral valve leaflet(s).   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   8. Normal pulmonary artery systolic pressure.   9. The inferior vena cava is normal in size with greater than 50%  respiratory variability, suggesting right atrial pressure of 3 mmHg.  10. A prior study was performed on 02/10/2019.  11. No significant change from prior study.  12. Prior echo: LVEF 55-60%, moderate MR with MVP.   FINDINGS   Left Ventricle: Left ventricular ejection fraction, by visual estimation,  is 55 to 60%. The left ventricular internal cavity size was mildly to  moderately dilated left ventricle. There is no increased left ventricular  wall thickness. Left ventricular  diastolic parameters are consistent with Grade I diastolic dysfunction  (impaired relaxation). Indeterminate filling pressures.   Right Ventricle: The right ventricular size is normal. No increase in  right ventricular wall thickness. Global RV systolic function is has  normal systolic function. The tricuspid regurgitant velocity is 2.34 m/s,  and with an assumed right atrial pressure   of 3 mmHg, the estimated right ventricular systolic pressure is normal at  25.0 mmHg.   Mitral Valve: The mitral  valve is myxomatous. There is moderate late  systolic prolapse of of the mitral valve. There is moderate thickening of  the mitral valve leaflet(s). MV Area by PHT, 2.48 cm. MV PHT, 88.74 msec.  Moderate mitral valve regurgitation,   with eccentric posteriorly directed jet.   Venous: The inferior vena cava is normal in size with greater than 50%  respiratory variability, suggesting right atrial pressure of 3 mmHg.   Additional Comments: A prior study was performed on 02/10/2019.     LEFT VENTRICLE         Normals  PLAX 2D  LVIDd:         6.30 cm 3.6 cm   Diastology                 Normals  LVIDs:         4.10 cm 1.7 cm   LV e' lateral:   9.14 cm/s 6.42 cm/s  LV PW:         0.90 cm 1.4 cm   LV E/e' lateral: 8.3       15.4  LV IVS:        0.90 cm 1.3 cm   LV e' medial:    5.44 cm/s 6.96 cm/s  LV SV:         127 ml  79 ml    LV E/e' medial:  14.0      6.96  LV SV Index:   66.27   45 ml/m2      RIGHT VENTRICLE  RVSP:           25.0 mmHg   LEFT ATRIUM         Index      RIGHT ATRIUM  LA diam:    4.20 cm 2.21 cm/m RA Pressure: 3.00 mmHg      AORTA                 Normals  Ao Root diam: 3.70 cm 31 mm   MITRAL VALVE            Normals     TRICUSPID VALVE             Normals                                      TR Peak grad:   22.0 mmHg  55 ms       TR Vmax:        236.00 cm/s 288 cm/s  MV Decel Time: 306 msec 187 ms      Estimated RAP:  3.00 mmHg  MV E velocity: 76.30 cm/s 103 cm/s  RVSP:           25.0 mmHg  MV A velocity: 70.70 cm/s 70.3 cm/s  MV E/A ratio:  1.08       1.5      Lyman Bishop MD  Electronically signed by Lyman Bishop MD  Signature Date/Time: 03/14/2019/4:15:55 PMThe mitral valve is myxomatous.            TRANSESOPHOGEAL ECHO REPORT         Patient Name:   Alexander [name] Date of Exam: 05/23/2019  Medical Rec #:  [id number]          Height:       70.0 in  Accession #:    [id number]         Weight:       170.0 lb  Date of Birth:   Jun 15, 1940          BSA:          1.948 m  Patient Age:    8 years           BP:           120/76 mmHg  Patient Gender: M                  HR:           71 bpm.  Exam Location:  Inpatient   Procedure: Transesophageal Echo, 3D Echo, Color Doppler, Cardiac Doppler  and             Saline Contrast Bubble Study   Indications:     I34.0 Nonrheumatic mitral (valve) insufficiency     History:         Patient has prior history of Echocardiogram examinations,  most                   recent 03/14/2019. Arrythmias:RBBB; Risk  Factors:Dyslipidemia and                   Hypertension. Mitral Valve Prolapse.     Sonographer:     Raquel Sarna Senior RDCS  Referring Phys:  [id number] [place]  Diagnosing Phys: [name] MD   PROCEDURE: After discussion of the risks and benefits of a TEE, an  informed consent was obtained from the patient. The transesophogeal probe  was passed without difficulty through the esophogus of the patient.  Sedation performed by different physician.  The patient was monitored while under deep sedation. Anesthestetic  sedation was provided intravenously by Anesthesiology: 206mg  of Propofol.  The patient's vital signs; including heart rate, blood pressure, and  oxygen saturation; remained stable  throughout the procedure. The patient developed no complications during  the procedure.   IMPRESSIONS     1. 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  Reversal of forward flow is seen in the right sided  pulmonary veins. Mitral regurgitation is severe.   2. Left ventricular ejection fraction, by estimation, is 60 to 65%. The  left ventricle has normal function. The left ventricle has no regional  wall motion abnormalities.  Left ventricular diastolic function could not  be evaluated.   3. Right ventricular systolic  function is normal. The right ventricular  size is normal. There is normal pulmonary artery systolic pressure. The  estimated right ventricular systolic pressure is 0000000 mmHg.   4. Left atrial size was mildly dilated. No left atrial/left atrial  appendage thrombus was detected.   5. Right atrial size was mildly dilated.   6. The mitral valve is myxomatous. Severe mitral valve regurgitation. No  evidence of mitral stenosis.   7. The aortic valve is normal in structure. Aortic valve regurgitation is  not visualized. No aortic stenosis is present.   8. The inferior vena cava is normal in size with greater than 50%  respiratory variability, suggesting right atrial pressure of 3 mmHg.   9. Agitated saline contrast bubble study was negative, with no evidence  of any interatrial shunt.   Conclusion(s)/Recommendation(s): Normal biventricular function without  evidence of hemodynamically significant valvular heart disease.   FINDINGS   Left Ventricle: Left ventricular ejection fraction, by estimation, is 60  to 65%. The left ventricle has normal function. The left ventricle has no  regional wall motion abnormalities. The left ventricular internal cavity  size was normal in size. There is   no left ventricular hypertrophy. Left ventricular diastolic function  could not be evaluated.   Right Ventricle: The right ventricular size is normal. No increase in  right ventricular wall thickness. Right ventricular systolic function is  normal. There is normal pulmonary artery systolic pressure. The tricuspid  regurgitant velocity is 2.54 m/s, and   with an assumed right atrial pressure of 3 mmHg, the estimated right  ventricular systolic pressure is 0000000 mmHg.   Left Atrium: Left atrial size was mildly dilated. No left atrial/left  atrial appendage thrombus was detected.   Right Atrium: Right atrial size was mildly dilated. Prominent Eustachian  valve.   Pericardium: There is no evidence of  pericardial effusion.   Mitral Valve: The mitral valve is myxomatous. There is moderate prolapse  of both leaflets of the mitral valve. There is severe thickening of the  mitral valve leaflet(s). Normal mobility of the mitral valve leaflets.  Mild mitral annular calcification.  Severe mitral valve regurgitation. No evidence of mitral valve stenosis.   Tricuspid Valve: The tricuspid valve is normal in structure. Tricuspid  valve regurgitation is mild . No evidence of tricuspid stenosis.   Aortic Valve: The aortic valve is normal in structure. Aortic valve  regurgitation is not visualized. No aortic stenosis is present.   Pulmonic Valve: The pulmonic valve was normal in structure. Pulmonic valve  regurgitation is trivial. No evidence of pulmonic stenosis.   Aorta: The aortic root is normal in size and structure. There is minimal  (Grade I) plaque.   Venous: The inferior vena cava is normal in size with greater than 50%  respiratory variability, suggesting right atrial pressure of 3 mmHg.   IAS/Shunts: No atrial level shunt detected by color flow Doppler. Agitated  saline contrast was given intravenously to evaluate for intracardiac  shunting. Agitated saline contrast bubble study was negative, with no  evidence of any interatrial shunt. There   is no evidence of a patent foramen ovale. There is no evidence of an  atrial septal defect.   Additional Comments: A pacer wire is visualized.      TRICUSPID VALVE  TR Peak grad:   25.8 mmHg  TR Vmax:        254.00 cm/s  [name] MD  Electronically signed by [name] MD  Signature Date/Time: 05/23/2019/10:03:48 AM   Treatments:   CARDIOTHORACIC SURGERY OPERATIVE NOTE   Date of Procedure:                06/14/2019   Preoperative Diagnosis:      Severe Mitral Regurgitation   Postoperative Diagnosis:    Same   Procedure:        Minimally-Invasive Mitral Valve Repair             Complex valvuloplasty including  artificial Gore-tex neochord placement x12             Sorin Memo 4D Ring Annuloplasty (size 47mm, catalog # [id number], serial # [id number])                Surgeon:        Valentina Gu. 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   Patient states that he has known of a presence of a heart murmur and mitral valve prolapse for many years.  For a long time he was followed by Dr. Mare Ferrari, and more recently he has been followed by Dr. [name].  He has remained physically very active all of his adult life and exercises on a regular basis.  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  He underwent permanent pacemaker placement and has done well ever since.  Over the last several months the patient has developed new onset of exertional shortness of breath.  Symptoms only occur with more strenuous exertion, although the patient had 1 or 2 episodes of severe shortness of breath following strenuous exertion that was slow to recover.  Despite this he has still remained active and he continues to exercise on a regular basis.  He was seen  in follow-up recently by Dr. [name] and noted to have a more prominent systolic murmur on physical exam.  Follow-up transthoracic echocardiogram revealed normal left ventricular systolic function with at least moderate mitral regurgitation.  TEE was performed May 23, 2019 confirmed the presence of myxomatous degenerative disease with bileaflet prolapse and severe mitral regurgitation.  There were 3 distinct jets of regurgitation and flow reversal noted in the right-sided pulmonary veins.  There remain normal left ventricular systolic function.  There was mild left atrial enlargement.  No other significant abnormalities were noted.  Cardiothoracic surgical consultation was requested.   The patient has been seen in consultation and counseled at length regarding the indications, risks and potential benefits of surgery.  All questions have been answered, and the patient provides full informed consent for the operation as described.    Discharge Exam: Blood pressure  122/65, pulse 75, temperature 97.9 F (36.6 C), temperature source Oral, resp. rate 16, height 5' 9.5" (1.765 m), weight 77.7 kg, SpO2 94 %.  Physical Exam: General appearance: alert, cooperative and no distress Neurologic: intact Heart: NSR Lungs: Breath sounds are clear.  Extremities: all well perfused, no edema Wound:the chest incision has some expect bruising but is intact and dry.    Disposition: Discharged to home in stable condition.     Allergies as of 06/20/2019   No Known Allergies      Medication List     STOP taking these medications    acetaminophen 500 MG tablet Commonly known as: TYLENOL       TAKE these medications    amiodarone 200 MG tablet Commonly known as: PACERONE Take 1 tablet (200 mg total) by mouth 2 (two) times daily after a meal.   multivitamin with minerals Tabs tablet Take 1 tablet by mouth daily.   vitamin C 1000 MG tablet Take 1,000 mg by mouth daily. Notes to patient: Take as you  were prior to admission    Vitamin D 50 MCG (2000 UT) tablet Take 2,000 Units by mouth daily. Notes to patient: Take as you were prior to admission   warfarin 3 MG tablet Commonly known as: COUMADIN Take as directed. If you are unsure how to take this medication, talk to your nurse or doctor. Original instructions: Take 1 tablet (3 mg total) by mouth daily at 4 PM.   zolpidem 10 MG tablet Commonly known as: AMBIEN TAKE ONE TABLET AT BEDTIME AS NEEDED What changed: reasons to take this       ASK your doctor about these medications    traMADol 50 MG tablet Commonly known as: ULTRAM Take 1 tablet (50 mg total) by mouth every 6 (six) hours as needed for up to 5 days for moderate pain. Ask about: Should I take this medication?       Follow-up Information     Imogene Burn, PA-C. Go on 07/06/2019.   Specialty: Cardiology Why: You have a cardiology follow up appointment on Wednesday, 07/18/19 at 12:15pm. Contact information: [address] [place] [address] [phone]         [name], MD. Go on 07/18/2019.   Specialty: Cardiothoracic Surgery Why: You have a follow up appointment with Dr. [name] on Monday, 07/18/19 at 1:30pm. Please arrive 30 minutes early for a chest x-ray to be performed by [facility] located on the first floor of the same building.  Contact information: [address] [place] [address] [place] Office. Go on 06/23/2019.   Specialty: Cardiology Why: [facility] 4/15 @2 :30 pm ([facility]) for PT / INR blood test. Contact information: [address], [place] [phone]          The patient has been discharged on:   1.Beta Blocker:  Yes [   ]                              No   [ x  ]                              If No, reason: No CAD  2.Ace Inhibitor/ARB: Yes [   ]  No  [ x   ]                                     If No, reason: No CAD  3.Statin:   Yes [   ]                  No  [ x  ]                  If No, reason: No CAD  4.Shela CommonsVelta Addison  [  x ]                  No   [   ]                  If No, reason:    Signed: Antony Odea, PA-C 06/29/2019, 10:15 AM

## 2019-06-17 NOTE — Progress Notes (Signed)
Epicardial pacing wires removed at 1100. VS stable before, during, and after procedure. Wire ends intact upon removal. Patient educated. Site cleansed and re-dressed. Patient tolerated procedure well.  [employer] RN

## 2019-06-17 NOTE — Progress Notes (Signed)
  Amiodarone Drug - Drug Interaction Consult Note  Recommendations: Monitor for increased risk of cardiotoxicity if hypokalemia occurs on Lasix. Monitor for increased anticoagulant effect with patient on warfarin and consider dose reduction. Amiodarone is metabolized by the cytochrome P450 system and therefore has the potential to cause many drug interactions. Amiodarone has an average plasma half-life of 50 days (range 20 to 100 days).   There is potential for drug interactions to occur several weeks or months after stopping treatment and the onset of drug interactions may be slow after initiating amiodarone.   []  Statins: Increased risk of myopathy. Simvastatin- restrict dose to 20mg  daily. Other statins: counsel patients to report any muscle pain or weakness immediately.  [x]  Anticoagulants: Amiodarone can increase anticoagulant effect. Consider warfarin dose reduction. Patients should be monitored closely and the dose of anticoagulant altered accordingly, remembering that amiodarone levels take several weeks to stabilize.  []  Antiepileptics: Amiodarone can increase plasma concentration of phenytoin, the dose should be reduced. Note that small changes in phenytoin dose can result in large changes in levels. Monitor patient and counsel on signs of toxicity.  []  Beta blockers: increased risk of bradycardia, AV block and myocardial depression. Sotalol - avoid concomitant use.  []   Calcium channel blockers (diltiazem and verapamil): increased risk of bradycardia, AV block and myocardial depression.  []   Cyclosporine: Amiodarone increases levels of cyclosporine. Reduced dose of cyclosporine is recommended.  []  Digoxin dose should be halved when amiodarone is started.  [x]  Diuretics: increased risk of cardiotoxicity if hypokalemia occurs.  []  Oral hypoglycemic agents (glyburide, glipizide, glimepiride): increased risk of hypoglycemia. Patient's glucose levels should be monitored closely when  initiating amiodarone therapy.   []  Drugs that prolong the QT interval:  Torsades de pointes risk may be increased with concurrent use - avoid if possible.  Monitor QTc, also keep magnesium/potassium WNL if concurrent therapy can't be avoided. Marland Kitchen Antibiotics: e.g. fluoroquinolones, erythromycin. . Antiarrhythmics: e.g. quinidine, procainamide, disopyramide, sotalol. . Antipsychotics: e.g. phenothiazines, haloperidol.  . Lithium, tricyclic antidepressants, and methadone. Thank You,  Marshia Ly  06/17/2019 6:13 PM

## 2019-06-17 NOTE — Progress Notes (Signed)
TCTS Evening Rounds  POD 3 s/p MiniMitral Repair No complaints In new-onset afib this pm  Exam otherwise unremarkable  A/p: amiodarone load/drip Leave chest tube over night Orders for Progressive Alexander [name] Seen, [name]

## 2019-06-18 ENCOUNTER — Inpatient Hospital Stay (HOSPITAL_COMMUNITY): Payer: Medicare Other

## 2019-06-18 LAB — PROTIME-INR
INR: 1.2 (ref 0.8–1.2)
Prothrombin Time: 15 seconds (ref 11.4–15.2)

## 2019-06-18 LAB — GLUCOSE, CAPILLARY
Glucose-Capillary: 85 mg/dL (ref 70–99)
Glucose-Capillary: 115 mg/dL — ABNORMAL HIGH (ref 70–99)

## 2019-06-18 NOTE — Progress Notes (Signed)
4 Days Post-Op Procedure(s) (LRB): MINIMALLY INVASIVE MITRAL VALVE REPAIR (MVR)<TEE (Right) TRANSESOPHAGEAL ECHOCARDIOGRAM (TEE) (N/A) Subjective: No complaints  Objective: Vital signs in last 24 hours: Temp:  [98 F (36.7 C)-98.8 F (37.1 C)] 98.1 F (36.7 C) (04/10 0400) Pulse Rate:  [60-140] 64 (04/10 0600) Cardiac Rhythm: Normal sinus rhythm (04/10 0730) BP: (104-138)/(58-92) 113/77 (04/10 0600) SpO2:  [94 %-98 %] 98 % (04/10 0600)  Hemodynamic parameters for last 24 hours:    Intake/Output from previous day: 04/09 0701 - 04/10 0700 In: 932.9 [P.O.:300; I.V.:475; IV Piggyback:158] Out: 4280 [Urine:4090; Chest Tube:190] Intake/Output this shift: No intake/output data recorded.  General appearance: alert and cooperative Neurologic: intact Heart: regular rate and rhythm, S1, S2 normal, no murmur, click, rub or gallop Lungs: clear to auscultation bilaterally Abdomen: soft, non-tender; bowel sounds normal; no masses,  no organomegaly Extremities: extremities normal, atraumatic, no cyanosis or edema Wound: dressed, dry  Lab Results: Recent Labs    06/16/19 0244 06/17/19 0532  WBC 11.8* 9.1  HGB 10.3* 10.7*  HCT 31.8* 32.6*  PLT 81* 89*   BMET:  Recent Labs    06/16/19 0244 06/17/19 0532  NA 134* 136  K 3.9 3.6  CL 102 102  CO2 25 25  GLUCOSE 132* 104*  BUN 21 19  CREATININE 0.93 1.03  CALCIUM 7.7* 8.1*    PT/INR:  Recent Labs    06/18/19 0259  LABPROT 15.0  INR 1.2   ABG    Component Value Date/Time   PHART 7.304 (L) 06/14/2019 1609   HCO3 24.2 06/14/2019 1609   TCO2 26 06/14/2019 2044   ACIDBASEDEF 3.0 (H) 06/14/2019 1609   O2SAT 98.0 06/14/2019 1609   CBG (last 3)  Recent Labs    06/17/19 2351 06/18/19 0407 06/18/19 0752  GLUCAP 99 85 115*    Assessment/Plan: S/P Procedure(s) (LRB): MINIMALLY INVASIVE MITRAL VALVE REPAIR (MVR)<TEE (Right) TRANSESOPHAGEAL ECHOCARDIOGRAM (TEE) (N/A) Mobilize Diuresis d/c chest tube pending cxr   txfer orders to progressive   LOS: 4 days    Alexander [name] 06/18/2019

## 2019-06-18 NOTE — Progress Notes (Signed)
0900: Chest tube removal procedure perfromed at this time by this primary RN. Right lateral chest tube # 1 removed without any resistance. Right medial chest tube # 2 removed approximately halfway and resistance met. Charge RN called to bedside. The removed chest tube #1 clamped. Petroleum gauze placed around the still in place chest tube # 2 insertion site and redressed. Dr. Orvan Seen paged to come to bedside.

## 2019-06-18 NOTE — Progress Notes (Signed)
CARDIAC REHAB PHASE I   Started edu with pt. Reviewed exercise guidelines, heart healthy nutrition, restrictions/sternal precautions, and phase II Cardiac Rehab. Will send referral to [facility]. Left pt with brochure and education handouts. Pt verbalized understanding. Call bell within reach.  [phone]  Carma Lair MS, ACSM CEP  12:39 PM 06/18/2019

## 2019-06-18 NOTE — Progress Notes (Signed)
Pt arrived from [facility] via wheelchair. CHG bath given. Telebox 5 applied/ccmd notified. Vitals stable. Pt denies pain. Pt chest tube bandages saturated and leaking serous drainage.. Pt has Vaseline gauze over 1 suture. 1 incision without suture. Incision redressed. Pt oriented to room and callbell within reach. Will continue to monitor.  Jerald Kief, RN

## 2019-06-18 NOTE — Progress Notes (Signed)
1100: Pt transferred to [facility] room 5 by this primary RN via wheel chair. Pt hearing aids in his ears during transfer. Cell phone  and personal belonging bags placed on bedside table in new room.

## 2019-06-18 NOTE — Progress Notes (Signed)
Pt ambulated 470 feet with walker. Pt tolerated well.  Will continue to monitor.  Jerald Kief, RN

## 2019-06-19 ENCOUNTER — Inpatient Hospital Stay (HOSPITAL_COMMUNITY): Payer: Medicare Other

## 2019-06-19 LAB — BASIC METABOLIC PANEL
Anion gap: 9 (ref 5–15)
BUN: 11 mg/dL (ref 8–23)
CO2: 29 mmol/L (ref 22–32)
Calcium: 8.3 mg/dL — ABNORMAL LOW (ref 8.9–10.3)
Chloride: 102 mmol/L (ref 98–111)
Creatinine, Ser: 0.77 mg/dL (ref 0.61–1.24)
GFR calc Af Amer: >60 mL/min (ref >60)
GFR calc non Af Amer: >60 mL/min (ref >60)
Glucose, Bld: 107 mg/dL — ABNORMAL HIGH (ref 70–99)
Potassium: 3.8 mmol/L (ref 3.5–5.1)
Sodium: 140 mmol/L (ref 135–145)

## 2019-06-19 LAB — PROTIME-INR
INR: 1.4 — ABNORMAL HIGH (ref 0.8–1.2)
Prothrombin Time: 17 seconds — ABNORMAL HIGH (ref 11.4–15.2)

## 2019-06-19 MED ORDER — LACTULOSE 10 GM/15ML PO SOLN: 10.0000 g | Freq: Every day | ORAL | Status: DC | PRN

## 2019-06-19 MED ORDER — AMIODARONE HCL 200 MG PO TABS
400.0000 mg | ORAL_TABLET | Freq: Every day | ORAL | Status: DC
  Administered 2019-06-19: 400 mg via ORAL
  Filled 2019-06-19: qty 2

## 2019-06-19 NOTE — Progress Notes (Signed)
5 Days Post-Op Procedure(s) (LRB): MINIMALLY INVASIVE MITRAL VALVE REPAIR (MVR)<TEE (Right) TRANSESOPHAGEAL ECHOCARDIOGRAM (TEE) (N/A) Subjective: "I've got to get out of here"  Objective: Vital signs in last 24 hours: Temp:  [97.8 F (36.6 C)-98.5 F (36.9 C)] 98.5 F (36.9 C) (04/11 0732) Pulse Rate:  [66-78] 66 (04/11 0732) Cardiac Rhythm: Normal sinus rhythm (04/11 0700) Resp:  [14-19] 18 (04/11 0732) BP: (101-124)/(56-84) 121/63 (04/11 0732) SpO2:  [94 %-100 %] 98 % (04/11 0732) Weight:  [78.7 kg] 78.7 kg (04/11 0550)  Hemodynamic parameters for last 24 hours:    Intake/Output from previous day: 04/10 0701 - 04/11 0700 In: 847.5 [P.O.:480; I.V.:367.5] Out: 1450 [Urine:1450] Intake/Output this shift: No intake/output data recorded.  General appearance: alert and cooperative Neurologic: intact Heart: regular rate and rhythm, S1, S2 normal, no murmur, click, rub or gallop Lungs: diminished breath sounds RML Extremities: extremities normal, atraumatic, no cyanosis or edema  Lab Results: Recent Labs    06/17/19 0532 06/19/19 0332  WBC 9.1 6.9  HGB 10.7* 10.9*  HCT 32.6* 32.2*  PLT 89* 154   BMET:  Recent Labs    06/17/19 0532 06/19/19 0332  NA 136 140  K 3.6 3.8  CL 102 102  CO2 25 29  GLUCOSE 104* 107*  BUN 19 11  CREATININE 1.03 0.77  CALCIUM 8.1* 8.3*    PT/INR:  Recent Labs    06/19/19 0332  LABPROT 17.0*  INR 1.4*   ABG    Component Value Date/Time   PHART 7.304 (L) 06/14/2019 1609   HCO3 24.2 06/14/2019 1609   TCO2 26 06/14/2019 2044   ACIDBASEDEF 3.0 (H) 06/14/2019 1609   O2SAT 98.0 06/14/2019 1609   CBG (last 3)  Recent Labs    06/17/19 2351 06/18/19 0407 06/18/19 0752  GLUCAP 99 85 115*    Assessment/Plan: S/P Procedure(s) (LRB): MINIMALLY INVASIVE MITRAL VALVE REPAIR (MVR)<TEE (Right) TRANSESOPHAGEAL ECHOCARDIOGRAM (TEE) (N/A) Mobilize Diuresis anticipate discharge tomorrow   LOS: 5 days    [name] 06/19/2019

## 2019-06-19 NOTE — Progress Notes (Signed)
Pt ambulated around the unit x 300 feet, tolerated well

## 2019-06-19 NOTE — Progress Notes (Signed)
Patient ambulated in hallway 720 feet with nursing staff and rolling walker. Back in room will monitor patient. Daishawn Lauf, Bettina Gavia rN

## 2019-06-19 NOTE — Progress Notes (Signed)
Patient ambulated in hallway  Approximately 950 feet with rolling walker and a fast pace, patient back in room vital signs obtained. Will continue to monitor patient. Fahad Cisse, Bettina Gavia RN

## 2019-06-20 LAB — CBC
HCT: 32.2 % — ABNORMAL LOW (ref 39.0–52.0)
Hemoglobin: 10.9 g/dL — ABNORMAL LOW (ref 13.0–17.0)
MCH: 31.6 pg (ref 26.0–34.0)
MCHC: 33.9 g/dL (ref 30.0–36.0)
MCV: 93.3 fL (ref 80.0–100.0)
Platelets: 154 10*3/uL (ref 150–400)
RBC: 3.45 MIL/uL — ABNORMAL LOW (ref 4.22–5.81)
RDW: 13.2 % (ref 11.5–15.5)
WBC: 6.9 10*3/uL (ref 4.0–10.5)
nRBC: 0 % (ref 0.0–0.2)

## 2019-06-20 LAB — PROTIME-INR
INR: 1.9 — ABNORMAL HIGH (ref 0.8–1.2)
Prothrombin Time: 21.9 seconds — ABNORMAL HIGH (ref 11.4–15.2)

## 2019-06-20 MED ORDER — AMIODARONE HCL 200 MG PO TABS: 200.0000 mg | ORAL_TABLET | Freq: Two times a day (BID) | ORAL | 1 refills | Status: DC

## 2019-06-20 MED ORDER — TRAMADOL HCL 50 MG PO TABS: 50.0000 mg | ORAL_TABLET | Freq: Four times a day (QID) | ORAL | 0 refills | Status: AC | PRN

## 2019-06-20 MED ORDER — WARFARIN SODIUM 3 MG PO TABS: 3.0000 mg | ORAL_TABLET | Freq: Every day | ORAL | 2 refills | Status: DC

## 2019-06-20 MED ORDER — AMIODARONE HCL 200 MG PO TABS
200.0000 mg | ORAL_TABLET | Freq: Two times a day (BID) | ORAL | Status: DC
  Administered 2019-06-20: 09:00:00 200 mg via ORAL
  Filled 2019-06-20: qty 1

## 2019-06-20 MED ORDER — WARFARIN SODIUM 3 MG PO TABS: 3.0000 mg | ORAL_TABLET | Freq: Every day | ORAL | Status: DC

## 2019-06-20 NOTE — Progress Notes (Signed)
Patient right chest tube suture removed as ordered and steri strip applied. Will continue to monitor patient. Bailie Christenbury, Bettina Gavia RN

## 2019-06-20 NOTE — Progress Notes (Signed)
Patient and spouse given discharge instructions, medication list and follow up appointments. Patient prescriptions sent to personal pharmacy. Patient verbalized understanding all and questions answered. Will discharge home as ordered. IV and tele were dcd. Transported to exit via wheel chair and hospital staff. Lyndia Bury, Bettina Gavia RN

## 2019-06-20 NOTE — Care Management Important Message (Signed)
Important Message  Patient Details  Name: Alexander [name] MRN: [id number] Date of Birth: 11/22/1940   Medicare Important Message Given:  Yes     [name] 06/20/2019, 10:57 AM

## 2019-06-20 NOTE — Progress Notes (Signed)
CARDIAC REHAB PHASE I   PRE:  Rate/Rhythm: 81 SR  BP:  Supine:    Sitting: 108/66  Standing:    SaO2: 92%RA  MODE:  Ambulation: 940 ft   POST:  Rate/Rhythm: 87 SR  BP:  Supine:   Sitting: 122/65  Standing:    SaO2: 95%RA 0820-0850 Pt walked independently 940 ft on RA with rolling walker and no assistance. Gait steady. Tolerated well. To recliner with call bell.    Graylon Good, RN BSN  06/20/2019 8:46 AM

## 2019-06-20 NOTE — Progress Notes (Addendum)
       [address]       [place] [address]             [phone]     6 Days Post-Op Procedure(s) (LRB): MINIMALLY INVASIVE MITRAL VALVE REPAIR (MVR)<TEE (Right) TRANSESOPHAGEAL ECHOCARDIOGRAM (TEE) (N/A) Subjective: Out of bed and walking around his room this morning. No new issues or concerns. He feels he is ready to return home.   Objective: Vital signs in last 24 hours: Temp:  [97.9 F (36.6 C)-99 F (37.2 C)] 97.9 F (36.6 C) (04/12 0811) Pulse Rate:  [69-76] 75 (04/12 0811) Cardiac Rhythm: Normal sinus rhythm (04/12 0507) Resp:  [15-24] 18 (04/12 0811) BP: (108-133)/(63-67) 108/66 (04/12 0811) SpO2:  [94 %-97 %] 94 % (04/12 0811) Weight:  [77.7 kg] 77.7 kg (04/12 0507)    Intake/Output from previous day: 04/11 0701 - 04/12 0700 In: 600 [P.O.:600] Out: 700 [Urine:700] Intake/Output this shift: No intake/output data recorded.  Physical Exam: General appearance:alert, cooperative and no distress Neurologic:intact Heart:NSR Lungs:Breath sounds are clear.  Extremities:all well perfused, no edema Wound:the chest incision has some expect bruising but is intact and dry.   Lab Results: Recent Labs    06/19/19 0332  WBC 6.9  HGB 10.9*  HCT 32.2*  PLT 154   BMET:  Recent Labs    06/19/19 0332  NA 140  K 3.8  CL 102  CO2 29  GLUCOSE 107*  BUN 11  CREATININE 0.77  CALCIUM 8.3*    PT/INR:  Recent Labs    06/20/19 0301  LABPROT 21.9*  INR 1.9*   ABG    Component Value Date/Time   PHART 7.304 (L) 06/14/2019 1609   HCO3 24.2 06/14/2019 1609   TCO2 26 06/14/2019 2044   ACIDBASEDEF 3.0 (H) 06/14/2019 1609   O2SAT 98.0 06/14/2019 1609   CBG (last 3)  Recent Labs    06/17/19 2351 06/18/19 0407 06/18/19 0752  GLUCAP 99 85 115*    Assessment/Plan: S/P Procedure(s) (LRB): MINIMALLY INVASIVE MITRAL VALVE REPAIR (MVR)<TEE (Right) TRANSESOPHAGEAL ECHOCARDIOGRAM (TEE) (N/A)  -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 Tolerating a regular diet. Independent with mobility. Good sats on RA.  INR 1.9 on coumadin 3mg  daily.   -Mild Expected acute blood loss anemia- stable Hct.   -Thrombocytopenia- Resolved. Plt count >150,000  - Volume excess-resolved. Plan no further diuretics at discharge.   -Discharge to home today.    LOS: 6 days    Antony Odea, PA-C [phone] 06/20/2019  I have seen and examined the patient and agree with the assessment and plan as outlined.  D/C home today.  Instructions given.  [name], MD 06/20/2019 9:11 AM

## 2019-06-20 NOTE — Progress Notes (Signed)
[id number] Reviewed education with pt regarding walking for ex, wound care, IS, and activity. Has been referred to [place] CRP 2. Understanding voiced. Graylon Good RN BSN 06/20/2019 12:39 PM

## 2019-06-20 NOTE — Care Management Important Message (Signed)
Important Message  Patient Details  Name: Alexander [name] MRN: [id number] Date of Birth: Aug 07, 1940   Medicare Important Message Given:  Yes     [name] 06/20/2019, 10:59 AM

## 2019-06-21 ENCOUNTER — Telehealth: Payer: Self-pay | Admitting: Cardiology

## 2019-06-21 ENCOUNTER — Ambulatory Visit (INDEPENDENT_AMBULATORY_CARE_PROVIDER_SITE_OTHER): Payer: Medicare Other | Admitting: *Deleted

## 2019-06-21 DIAGNOSIS — Z950 Presence of cardiac pacemaker: Secondary | ICD-10-CM | POA: Diagnosis not present

## 2019-06-21 LAB — CUP PACEART REMOTE DEVICE CHECK
Battery Remaining Longevity: 146 mo
Battery Voltage: 3.02 V
Brady Statistic AP VP Percent: 0 %
Brady Statistic AP VS Percent: 1.13 %
Brady Statistic AS VP Percent: 0.03 %
Brady Statistic AS VS Percent: 98.84 %
Brady Statistic RA Percent Paced: 1.37 %
Brady Statistic RV Percent Paced: 0.04 %
Date Time Interrogation Session: 20210413002838
Implantable Lead Implant Date: 20190805
Implantable Lead Implant Date: 20190805
Implantable Lead Location: 753859
Implantable Lead Location: 753860
Implantable Lead Model: 5076
Implantable Lead Model: 5076
Implantable Pulse Generator Implant Date: 20190805
Lead Channel Impedance Value: 266 Ohm
Lead Channel Impedance Value: 304 Ohm
Lead Channel Impedance Value: 342 Ohm
Lead Channel Impedance Value: 380 Ohm
Lead Channel Pacing Threshold Amplitude: 0.625 V
Lead Channel Pacing Threshold Amplitude: 0.75 V
Lead Channel Pacing Threshold Pulse Width: 0.4 ms
Lead Channel Pacing Threshold Pulse Width: 0.4 ms
Lead Channel Sensing Intrinsic Amplitude: 4.375 mV
Lead Channel Sensing Intrinsic Amplitude: 4.375 mV
Lead Channel Sensing Intrinsic Amplitude: 4.875 mV
Lead Channel Sensing Intrinsic Amplitude: 4.875 mV
Lead Channel Setting Pacing Amplitude: 2 V
Lead Channel Setting Pacing Amplitude: 2.5 V
Lead Channel Setting Pacing Pulse Width: 0.4 ms
Lead Channel Setting Sensing Sensitivity: 1.2 mV

## 2019-06-21 NOTE — Telephone Encounter (Signed)
Alexander [name] is calling requesting a nurse call him to go over the to do list he was given after his heart surgery. Please advise.

## 2019-06-21 NOTE — Telephone Encounter (Signed)
Spoke with the pt.  He wanted to go over all his follow-up appts with our office and Dr. Guy Sandifer office, since MVR done and discharged from hospital on 4/12. Went over discharge summary, appt notes, and all medications he should be taking. Did confirm with Dr. [name] that ASA 81 mg should be discontinued from his med list, being he is a new coumadin start.  Per Dr. [name], yes he should not be taking ASA and this can be removed.  Pt is aware of this instruction as well. Pt verbalized understanding and agrees with this plan.  All questions were answered for the pt and he was gracious for all the assistance provided.

## 2019-06-22 NOTE — Progress Notes (Signed)
PPM Remote  

## 2019-06-23 ENCOUNTER — Ambulatory Visit (INDEPENDENT_AMBULATORY_CARE_PROVIDER_SITE_OTHER): Payer: Medicare Other | Admitting: *Deleted

## 2019-06-23 ENCOUNTER — Other Ambulatory Visit: Payer: Self-pay

## 2019-06-23 ENCOUNTER — Other Ambulatory Visit (HOSPITAL_COMMUNITY): Payer: Self-pay | Admitting: *Deleted

## 2019-06-23 DIAGNOSIS — Z9889 Other specified postprocedural states: Secondary | ICD-10-CM

## 2019-06-23 DIAGNOSIS — I340 Nonrheumatic mitral (valve) insufficiency: Secondary | ICD-10-CM | POA: Diagnosis not present

## 2019-06-23 DIAGNOSIS — Z5181 Encounter for therapeutic drug level monitoring: Secondary | ICD-10-CM

## 2019-06-23 LAB — POCT INR: INR: 2.6 (ref 2.0–3.0)

## 2019-06-23 NOTE — Patient Instructions (Addendum)
Description   Start taking 1 tablet daily except for 1.5 tablets on Sundays. Recheck INR in 1 week. [facility] [phone]     A full discussion of the nature of anticoagulants has been carried out.  A benefit risk analysis has been presented to the patient, so that they understand the justification for choosing anticoagulation at this time. The need for frequent and regular monitoring, precise dosage adjustment and compliance is stressed.  Side effects of potential bleeding are discussed.  The patient should avoid any OTC items containing aspirin or ibuprofen, and should avoid great swings in general diet.  Avoid alcohol consumption.  Call if any signs of abnormal bleeding.

## 2019-06-24 ENCOUNTER — Telehealth (HOSPITAL_COMMUNITY): Payer: Self-pay

## 2019-06-24 NOTE — Telephone Encounter (Signed)
Attempted to call patient in regards to Cardiac Rehab - Unable to leave voicemail, voicemail box full.

## 2019-06-24 NOTE — Telephone Encounter (Signed)
Pt insurance is active and benefits verified through Medicare A/B. Co-pay $0.00, DED $203.00/$203.00 met, out of pocket $0.00/$0.00 met, co-insurance 20%. No pre-authorization required. Passport, 06/24/19 @ 9:55AM, NGW#[id number]  2ndary insurance is active and benefits verified through [employer]. Co-pay $0.00, DED $0.00/$0.00 met, out of pocket $0.00/$0.00 met, co-insurance 0%. No pre-authorization required. Passport, 06/24/19 @ 9:57AM, CWT#[id number]  Will contact patient to see if he is interested in the Cardiac Rehab Program. If interested, patient will need to complete follow up appt. Once completed, patient will be contacted for scheduling upon review by the RN Navigator.

## 2019-06-30 ENCOUNTER — Ambulatory Visit (INDEPENDENT_AMBULATORY_CARE_PROVIDER_SITE_OTHER): Payer: Medicare Other

## 2019-06-30 ENCOUNTER — Other Ambulatory Visit: Payer: Self-pay

## 2019-06-30 DIAGNOSIS — Z5181 Encounter for therapeutic drug level monitoring: Secondary | ICD-10-CM

## 2019-06-30 DIAGNOSIS — I340 Nonrheumatic mitral (valve) insufficiency: Secondary | ICD-10-CM | POA: Diagnosis not present

## 2019-06-30 DIAGNOSIS — Z9889 Other specified postprocedural states: Secondary | ICD-10-CM

## 2019-06-30 LAB — POCT INR: INR: 2.4 (ref 2.0–3.0)

## 2019-06-30 NOTE — Patient Instructions (Signed)
Description   Continue on same dosage 1 tablet daily except for 1.5 tablets on Sundays. Recheck INR in 1 week. [facility]in Clinic [phone]

## 2019-07-05 ENCOUNTER — Encounter (HOSPITAL_COMMUNITY): Payer: Self-pay | Admitting: *Deleted

## 2019-07-05 NOTE — Progress Notes (Signed)
Cardiology Office Note    Date:  07/06/2019   ID:  Romelle, Glanton 09/22/1940, MRN [id number]  PCP:  [name], MD  Cardiologist: [name], MD EPS: Will Meredith Leeds, MD  Chief Complaint  Patient presents with  . Hospitalization Follow-up    History of Present Illness:  Joseh Mazmanian is a 79 y.o. 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  Patient underwent mitral valve repair via right minithoracotomy 06/14/2019.  He developed postop atrial fibrillation started on amiodarone and converted to normal sinus rhythm.  Preop cath no evidence of CAD.  Normal right and left heart pressures.  Patient comes in for f/u. Walking 1 mile twice a day. Denies pain, palpitations, dizziness or edema.  Wants to start golfing again and driving.    Past Medical History:  Diagnosis Date  . Heart murmur   . Hyperlipidemia   . Hypertension   . Mitral regurgitation   . MVP (mitral valve prolapse)   . Presence of permanent cardiac pacemaker   . Syncope     Past Surgical History:  Procedure Laterality Date  . BUBBLE STUDY  05/23/2019   Procedure: BUBBLE STUDY;  Surgeon: [name], MD;  Location: [facility];  Service: Cardiovascular;;  . HERNIA REPAIR    . MITRAL VALVE REPAIR Right 06/14/2019   Procedure: MINIMALLY INVASIVE MITRAL VALVE REPAIR (MVR)<TEE;  Surgeon: [name], MD;  Location: [facility];  Service: Open Heart Surgery;  Laterality: Right;  . PACEMAKER IMPLANT N/A 10/12/2017   Procedure: PACEMAKER IMPLANT;  Surgeon: Constance Haw, MD;  Location: [facility];  Service: Cardiovascular;  Laterality: N/A;  . RIGHT/LEFT HEART CATH AND CORONARY ANGIOGRAPHY N/A 06/06/2019   Procedure: RIGHT/LEFT HEART CATH AND CORONARY ANGIOGRAPHY;  Surgeon: Burnell Blanks, MD;  Location: [facility];  Service:  Cardiovascular;  Laterality: N/A;  . TEE WITHOUT CARDIOVERSION N/A 05/23/2019   Procedure: TRANSESOPHAGEAL ECHOCARDIOGRAM (TEE);  Surgeon: [name], MD;  Location: [facility];  Service: Cardiovascular;  Laterality: N/A;  . TEE WITHOUT CARDIOVERSION N/A 06/14/2019   Procedure: TRANSESOPHAGEAL ECHOCARDIOGRAM (TEE);  Surgeon: [name], MD;  Location: [facility]  Service: Open Heart Surgery;  Laterality: N/A;  . TONSILECTOMY, ADENOIDECTOMY, BILATERAL MYRINGOTOMY AND TUBES     as child  . TONSILLECTOMY    . TOTAL KNEE ARTHROPLASTY Left   . TOTAL KNEE ARTHROPLASTY Left 02/23/2019   Procedure: TOTAL KNEE ARTHROPLASTY;  Surgeon: Gaynelle Arabian, MD;  Location: [facility];  Service: Orthopedics;  Laterality: Left;  62min    Current Medications: Current Meds  Medication Sig  . amiodarone (PACERONE) 200 MG tablet Take 1 tablet (200 mg total) by mouth daily.  . Ascorbic Acid (VITAMIN C) 1000 MG tablet Take 1,000 mg by mouth daily.  . Cholecalciferol (VITAMIN D) 50 MCG (2000 UT) tablet Take 2,000 Units by mouth daily.  . Multiple Vitamin (MULTIVITAMIN WITH MINERALS) TABS tablet Take 1 tablet by mouth daily.  Marland Kitchen warfarin (COUMADIN) 3 MG tablet Take 1 tablet (3 mg total) by mouth daily at 4 PM.  . zolpidem (AMBIEN) 10 MG tablet TAKE ONE TABLET AT BEDTIME AS NEEDED  . [DISCONTINUED] amiodarone (PACERONE) 200 MG tablet Take 1 tablet (200 mg total) by mouth 2 (two) times daily after a meal.     Allergies:   Patient has no known allergies.   Social History  Socioeconomic History  . Marital status: Married    Spouse name: Not on file  . Number of children: Not on file  . Years of education: Not on file  . Highest education level: Not on file  Occupational History  . Not on file  Tobacco Use  . Smoking status: Never Smoker  . Smokeless tobacco: Never Used  Substance and Sexual Activity  . Alcohol use: Yes    Alcohol/week: 5.0 standard drinks    Types: 5 Glasses of wine per week  . Drug  use: No  . Sexual activity: Not on file  Other Topics Concern  . Not on file  Social History Narrative  . Not on file   Social Determinants of Health   Financial Resource Strain:   . Difficulty of Paying Living Expenses:   Food Insecurity:   . Worried About Charity fundraiser in the Last Year:   . Arboriculturist in the Last Year:   Transportation Needs:   . Film/video editor (Medical):   Marland Kitchen Lack of Transportation (Non-Medical):   Physical Activity:   . Days of Exercise per Week:   . Minutes of Exercise per Session:   Stress:   . Feeling of Stress :   Social Connections:   . Frequency of Communication with Friends and Family:   . Frequency of Social Gatherings with Friends and Family:   . Attends Religious Services:   . Active Member of Clubs or Organizations:   . Attends Archivist Meetings:   Marland Kitchen Marital Status:      Family History:  The patient's   family history includes Alzheimer's disease in his mother; Hypertension in his father.   ROS:   Please see the history of present illness.    ROS All other systems reviewed and are negative.   PHYSICAL EXAM:   VS:  BP 128/76   Pulse 80   Ht 5' 9.5" (1.765 m)   Wt 166 lb 9.6 oz (75.6 kg)   SpO2 99%   BMI 24.25 kg/m   Physical Exam  [id number], in no acute distress  Neck: no JVD, carotid bruits, or masses Cardiac:RRR; minimal   Respiratory:  clear to auscultation bilaterally, normal work of breathing GI: soft, nontender, nondistended, + BS Ext: without cyanosis, clubbing, or edema, Good distal pulses bilaterally MS: no deformity or atrophy  Skin: warm and dry, no rash Neuro:  Alert and Oriented x 3 Psych: euthymic mood, full affect  Wt Readings from Last 3 Encounters:  07/06/19 166 lb 9.6 oz (75.6 kg)  06/20/19 171 lb 6.4 oz (77.7 kg)  06/13/19 174 lb (78.9 kg)      Studies/Labs Reviewed:   EKG:  EKG is not ordered today.    Recent Labs: 02/10/2019: NT-Pro BNP 139; TSH 1.310 06/10/2019: ALT  18 06/15/2019: Magnesium 2.6 06/19/2019: BUN 11; Creatinine, Ser 0.77; Hemoglobin 10.9; Platelets 154; Potassium 3.8; Sodium 140   Lipid Panel    Component Value Date/Time   CHOL 169 02/10/2019 1429   TRIG 80 02/10/2019 1429   HDL 81 02/10/2019 1429   CHOLHDL 2.1 02/10/2019 1429   LDLCALC 73 02/10/2019 1429    Additional studies/ records that were reviewed today include:    ECHOCARDIOGRAM LIMITED REPORT         Patient Name:   Deatra Canter Date of Exam: 03/14/2019  Medical Rec #:  [id number]          Height:  69.0 in  Accession #:    [id number]         Weight:       165.0 lb  Date of Birth:  [birth year]          BSA:          1.90 m  Patient Age:    42 years           BP:           122/50 mmHg  Patient Gender: M                  HR:           71 bpm.  Exam Location:  [facility]      Procedure: Limited Echo, Cardiac Doppler and Color Doppler   Indications:    R06.02 Shortness of breath. I34.1 MVP. LIMITED to evaluate  MVP.     History:        Patient has prior history of Echocardiogram examinations,  most                  recent 02/10/2019.     Sonographer:    Jessee Avers, RDCS  Referring Phys: Candee Furbish MD   IMPRESSIONS     1. Left ventricular ejection fraction, by visual estimation, is 55 to  60%. There is no increased left ventricular wall thickness.   2. Left ventricular diastolic parameters are consistent with Grade I  diastolic dysfunction (impaired relaxation).   3. Mild to moderately dilated left ventricular internal cavity size.   4. Global right ventricle has normal systolc function.The right  ventricular size is normal. no increase in right ventricular wall  thickness.   5. Moderate mitral valve prolapse.   6. Moderate thickening of the mitral valve leaflet(s).   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   8. Normal pulmonary artery systolic pressure.   9. The inferior vena cava is normal in size with greater  than 50%  respiratory variability, suggesting right atrial pressure of 3 mmHg.  10. A prior study was performed on 02/10/2019.  11. No significant change from prior study.  12. Prior echo: LVEF 55-60%, moderate MR with MVP.   FINDINGS   Left Ventricle: Left ventricular ejection fraction, by visual estimation,  is 55 to 60%. The left ventricular internal cavity size was mildly to  moderately dilated left ventricle. There is no increased left ventricular  wall thickness. Left ventricular  diastolic parameters are consistent with Grade I diastolic dysfunction  (impaired relaxation). Indeterminate filling pressures.   Right Ventricle: The right ventricular size is normal. No increase in  right ventricular wall thickness. Global RV systolic function is has  normal systolic function. The tricuspid regurgitant velocity is 2.34 m/s,  and with an assumed right atrial pressure   of 3 mmHg, the estimated right ventricular systolic pressure is normal at  25.0 mmHg.   Mitral Valve: The mitral valve is myxomatous. There is moderate late  systolic prolapse of of the mitral valve. There is moderate thickening of  the mitral valve leaflet(s). MV Area by PHT, 2.48 cm. MV PHT, 88.74 msec.  Moderate mitral valve regurgitation,   with eccentric posteriorly directed jet.   Venous: The inferior vena cava is normal in size with greater than 50%  respiratory variability, suggesting right atrial pressure of 3 mmHg.   Additional Comments: A prior study was performed on 02/10/2019.     LEFT VENTRICLE  Normals  PLAX 2D  LVIDd:         6.30 cm 3.6 cm   Diastology                 Normals  LVIDs:         4.10 cm 1.7 cm   LV e' lateral:   9.14 cm/s 6.42 cm/s  LV PW:         0.90 cm 1.4 cm   LV E/e' lateral: 8.3       15.4  LV IVS:        0.90 cm 1.3 cm   LV e' medial:    5.44 cm/s 6.96 cm/s  LV SV:         127 ml  79 ml    LV E/e' medial:  14.0      6.96  LV SV Index:   66.27   45 ml/m2      RIGHT  VENTRICLE  RVSP:           25.0 mmHg   LEFT ATRIUM         Index      RIGHT ATRIUM  LA diam:    4.20 cm 2.21 cm/m RA Pressure: 3.00 mmHg      AORTA                 Normals  Ao Root diam: 3.70 cm 31 mm   MITRAL VALVE            Normals     TRICUSPID VALVE             Normals                                      TR Peak grad:   22.0 mmHg                          55 ms       TR Vmax:        236.00 cm/s 288 cm/s  MV Decel Time: 306 msec 187 ms      Estimated RAP:  3.00 mmHg  MV E velocity: 76.30 cm/s 103 cm/s  RVSP:           25.0 mmHg  MV A velocity: 70.70 cm/s 70.3 cm/s  MV E/A ratio:  1.08       1.5      Lyman Bishop MD  Electronically signed by Lyman Bishop MD  Signature Date/Time: 03/14/2019/4:15:55 PMThe mitral valve is myxomatous.            TRANSESOPHOGEAL ECHO REPORT         Patient Name:   YALI PEASLEE Date of Exam: 05/23/2019  Medical Rec #:  [id number]          Height:       70.0 in  Accession #:    [id number]         Weight:       170.0 lb  Date of Birth:  [birth year]          BSA:          1.948 m  Patient Age:    44 years           BP:           120/76 mmHg  Patient Gender: M  HR:           71 bpm.  Exam Location:  Inpatient   Procedure: Transesophageal Echo, 3D Echo, Color Doppler, Cardiac Doppler  and             Saline Contrast Bubble Study   Indications:     I34.0 Nonrheumatic mitral (valve) insufficiency     History:         Patient has prior history of Echocardiogram examinations,  most                   recent 03/14/2019. Arrythmias:RBBB; Risk  Factors:Dyslipidemia and                   Hypertension. Mitral Valve Prolapse.     Sonographer:     Raquel Sarna Senior RDCS  Referring Phys:  [id number] [place]  Diagnosing Phys: [name] MD   PROCEDURE: After discussion of the risks and benefits of a TEE, an  informed consent was obtained from the patient. The transesophogeal probe  was passed without difficulty through the  esophogus of the patient.  Sedation performed by different physician.  The patient was monitored while under deep sedation. Anesthestetic  sedation was provided intravenously by Anesthesiology: 206mg  of Propofol.  The patient's vital signs; including heart rate, blood pressure, and  oxygen saturation; remained stable  throughout the procedure. The patient developed no complications during  the procedure.   IMPRESSIONS     1. 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  Reversal of forward flow is seen in the right sided  pulmonary veins. Mitral regurgitation is severe.   2. Left ventricular ejection fraction, by estimation, is 60 to 65%. The  left ventricle has normal function. The left ventricle has no regional  wall motion abnormalities. Left ventricular diastolic function could not  be evaluated.   3. Right ventricular systolic function is normal. The right ventricular  size is normal. There is normal pulmonary artery systolic pressure. The  estimated right ventricular systolic pressure is 0000000 mmHg.   4. Left atrial size was mildly dilated. No left atrial/left atrial  appendage thrombus was detected.   5. Right atrial size was mildly dilated.   6. The mitral valve is myxomatous. Severe mitral valve regurgitation. No  evidence of mitral stenosis.   7. The aortic valve is normal in structure. Aortic valve regurgitation is  not visualized. No aortic stenosis is present.   8. The inferior vena cava is normal in size with greater than 50%  respiratory variability, suggesting right atrial pressure of 3 mmHg.   9. Agitated saline contrast bubble study was negative, with no evidence  of any interatrial shunt.   Conclusion(s)/Recommendation(s): Normal biventricular function without  evidence of hemodynamically significant valvular  heart disease.   FINDINGS   Left Ventricle: Left ventricular ejection fraction, by estimation, is 60  to 65%. The left ventricle has normal function. The left ventricle has no  regional wall motion abnormalities. The left ventricular internal cavity  size was normal in size. There is   no left ventricular hypertrophy. Left ventricular diastolic function  could not be evaluated.   Right Ventricle: The right ventricular size is normal. No increase in  right ventricular wall thickness. Right ventricular systolic function is  normal. There is normal pulmonary artery systolic pressure. The tricuspid  regurgitant  velocity is 2.54 m/s, and   with an assumed right atrial pressure of 3 mmHg, the estimated right  ventricular systolic pressure is 0000000 mmHg.   Left Atrium: Left atrial size was mildly dilated. No left atrial/left  atrial appendage thrombus was detected.   Right Atrium: Right atrial size was mildly dilated. Prominent Eustachian  valve.   Pericardium: There is no evidence of pericardial effusion.   Mitral Valve: The mitral valve is myxomatous. There is moderate prolapse  of both leaflets of the mitral valve. There is severe thickening of the  mitral valve leaflet(s). Normal mobility of the mitral valve leaflets.  Mild mitral annular calcification.  Severe mitral valve regurgitation. No evidence of mitral valve stenosis.   Tricuspid Valve: The tricuspid valve is normal in structure. Tricuspid  valve regurgitation is mild . No evidence of tricuspid stenosis.   Aortic Valve: The aortic valve is normal in structure. Aortic valve  regurgitation is not visualized. No aortic stenosis is present.   Pulmonic Valve: The pulmonic valve was normal in structure. Pulmonic valve  regurgitation is trivial. No evidence of pulmonic stenosis.   Aorta: The aortic root is normal in size and structure. There is minimal  (Grade I) plaque.   Venous: The inferior vena cava is normal in size with  greater than 50%  respiratory variability, suggesting right atrial pressure of 3 mmHg.   IAS/Shunts: No atrial level shunt detected by color flow Doppler. Agitated  saline contrast was given intravenously to evaluate for intracardiac  shunting. Agitated saline contrast bubble study was negative, with no  evidence of any interatrial shunt. There   is no evidence of a patent foramen ovale. There is no evidence of an  atrial septal defect.   Additional Comments: A pacer wire is visualized.      TRICUSPID VALVE  TR Peak grad:   25.8 mmHg  TR Vmax:        254.00 cm/s   [name] MD  Electronically signed by [name] MD  Signature Date/Time: 05/23/2019/10:03:48 AM    Treatments:    CARDIOTHORACIC SURGERY OPERATIVE NOTE   Date of Procedure:                06/14/2019   Preoperative Diagnosis:      Severe Mitral Regurgitation   Postoperative Diagnosis:    Same   Procedure:         Minimally-Invasive Mitral Valve Repair             Complex valvuloplasty including artificial Gore-tex neochord placement x12             Sorin Memo 4D Ring Annuloplasty (size 60mm, catalog # [id number], serial # [id number])                Surgeon:        Valentina Gu. [name], MD   Assistant:       Ellwood Handler, PA-C   Anesthesia:    Arabella Merles, MD   Operative Findings: ? Barlow's type myxomatous degenerative disease ? Bileaflet prolapse with multiple elongated chordae tendinae ? Type II dysfunction with severe mitral regurgitation ? Normal left ventricular systolic function ? No residual mitral regurgitation after successful valve repair  Cardiac catheterization 06/06/2019 1. No angiographic evidence of CAD 2. Normal right and left sided pressures   Continue with planning for mitral valve surgery.    ASSESSMENT:    1. Status post mitral valve repair   2. Paroxysmal atrial fibrillation (HCC)  3. History of pacemaker   4. Hyperlipidemia, unspecified hyperlipidemia type       PLAN:  In order of problems listed above:  Status post minimally invasive mitral valve repair 06/14/2019 for severe MR-doing well walking 1 mile twice a day and wanting to start cardiac rehab.  Will order 2D echo to have a baseline after repair. On Coumadin  Postop atrial fibrillation on amiodarone has not had recurrence.  Reduce amiodarone to 200 mg once a day.  Follow-up with Dr. [name] in 1 to 2 months.  Status post pacemaker for sinus arrest  Hyperlipidemia Crestor stopped because of memory impairment calcium score was 94 which was 27th percentile but cardiac cath no evidence of coronary disease   Medication Adjustments/Labs and Tests Ordered: Current medicines are reviewed at length with the patient today.  Concerns regarding medicines are outlined above.  Medication changes, Labs and Tests ordered today are listed in the Patient Instructions below. Patient Instructions  Medication Instructions:  Your physician has recommended you make the following change in your medication:   DECREASE: amiodarone to 200 mg once a day  *If you need a refill on your cardiac medications before your next appointment, please call your pharmacy*   Lab Work: None ordered  If you have labs (blood work) drawn today and your tests are completely normal, you will receive your results only by: Marland Kitchen MyChart Message (if you have MyChart) OR . A paper copy in the mail If you have any lab test that is abnormal or we need to change your treatment, we will call you to review the results.   Testing/Procedures: Your physician has requested that you have an echocardiogram. Echocardiography is a painless test that uses sound waves to create images of your heart. It provides your doctor with information about the size and shape of your heart and how well your heart's chambers and valves are working. This procedure takes approximately one hour. There are no restrictions for this procedure.   Follow-Up: At [facility], you and your health needs are our priority.  As part of our continuing mission to provide you with exceptional heart care, we have created designated Provider Care Teams.  These Care Teams include your primary Cardiologist (physician) and Advanced Practice Providers (APPs -  Physician Assistants and Nurse Practitioners) who all work together to provide you with the care you need, when you need it.  We recommend signing up for the patient portal called "MyChart".  Sign up information is provided on this After Visit Summary.  MyChart is used to connect with patients for Virtual Visits (Telemedicine).  Patients are able to view lab/test results, encounter notes, upcoming appointments, etc.  Non-urgent messages can be sent to your provider as well.   To learn more about what you can do with MyChart, go to [email or web address].    Your next appointment:   1-2 month(s)  The format for your next appointment:   In Person  Provider:   [name], MD   Other Instructions      Signed, Ermalinda Barrios, PA-C  07/06/2019 12:42 PM    [place] [place], [place], [place]  [address] Phone: [phone]; Fax: [phone]

## 2019-07-05 NOTE — Progress Notes (Signed)
Spoke with pt regarding participating in CR.  Plan for pt to complete his follow up appt this week with cardiology and next week with the CV surgery. Pt concerned about transportation and being able to drive. Hopeful he will be released to drive after xray and CV appt.  Tentatively scheduled for orientation  5/13. Cherre Huger, BSN Cardiac and Training and development officer

## 2019-07-06 ENCOUNTER — Ambulatory Visit (INDEPENDENT_AMBULATORY_CARE_PROVIDER_SITE_OTHER): Payer: Medicare Other

## 2019-07-06 ENCOUNTER — Other Ambulatory Visit: Payer: Self-pay

## 2019-07-06 ENCOUNTER — Ambulatory Visit (INDEPENDENT_AMBULATORY_CARE_PROVIDER_SITE_OTHER): Payer: Medicare Other | Admitting: Physician Assistant

## 2019-07-06 ENCOUNTER — Encounter: Payer: Self-pay | Admitting: Physician Assistant

## 2019-07-06 VITALS — BP 128/76 | HR 80 | Ht 69.5 in | Wt 166.6 lb

## 2019-07-06 DIAGNOSIS — E785 Hyperlipidemia, unspecified: Secondary | ICD-10-CM

## 2019-07-06 DIAGNOSIS — Z9889 Other specified postprocedural states: Secondary | ICD-10-CM | POA: Diagnosis not present

## 2019-07-06 DIAGNOSIS — Z5181 Encounter for therapeutic drug level monitoring: Secondary | ICD-10-CM | POA: Diagnosis not present

## 2019-07-06 DIAGNOSIS — Z09 Encounter for follow-up examination after completed treatment for conditions other than malignant neoplasm: Secondary | ICD-10-CM

## 2019-07-06 DIAGNOSIS — I340 Nonrheumatic mitral (valve) insufficiency: Secondary | ICD-10-CM

## 2019-07-06 DIAGNOSIS — I480 Paroxysmal atrial fibrillation: Secondary | ICD-10-CM

## 2019-07-06 DIAGNOSIS — Z950 Presence of cardiac pacemaker: Secondary | ICD-10-CM

## 2019-07-06 LAB — POCT INR: INR: 2.8 (ref 2.0–3.0)

## 2019-07-06 MED ORDER — AMIODARONE HCL 200 MG PO TABS: 200.0000 mg | ORAL_TABLET | Freq: Every day | ORAL | 3 refills | Status: DC

## 2019-07-06 NOTE — Patient Instructions (Signed)
Description   Continue on same dosage 1 tablet daily except for 1.5 tablets on Sundays. Recheck INR in 2 weeks. [facility] [phone]

## 2019-07-06 NOTE — Patient Instructions (Signed)
Medication Instructions:  Your physician has recommended you make the following change in your medication:   DECREASE: amiodarone to 200 mg once a day  *If you need a refill on your cardiac medications before your next appointment, please call your pharmacy*   Lab Work: None ordered  If you have labs (blood work) drawn today and your tests are completely normal, you will receive your results only by: Marland Kitchen MyChart Message (if you have MyChart) OR . A paper copy in the mail If you have any lab test that is abnormal or we need to change your treatment, we will call you to review the results.   Testing/Procedures: Your physician has requested that you have an echocardiogram. Echocardiography is a painless test that uses sound waves to create images of your heart. It provides your doctor with information about the size and shape of your heart and how well your heart's chambers and valves are working. This procedure takes approximately one hour. There are no restrictions for this procedure.   Follow-Up: At [facility], you and your health needs are our priority.  As part of our continuing mission to provide you with exceptional heart care, we have created designated Provider Care Teams.  These Care Teams include your primary Cardiologist (physician) and Advanced Practice Providers (APPs -  Physician Assistants and Nurse Practitioners) who all work together to provide you with the care you need, when you need it.  We recommend signing up for the patient portal called "MyChart".  Sign up information is provided on this After Visit Summary.  MyChart is used to connect with patients for Virtual Visits (Telemedicine).  Patients are able to view lab/test results, encounter notes, upcoming appointments, etc.  Non-urgent messages can be sent to your provider as well.   To learn more about what you can do with MyChart, go to [email or web address].    Your next appointment:   1-2 month(s)  The format  for your next appointment:   In Person  Provider:   [name], MD   Other Instructions

## 2019-07-08 ENCOUNTER — Other Ambulatory Visit: Payer: Self-pay | Admitting: Thoracic Surgery (Cardiothoracic Vascular Surgery)

## 2019-07-08 DIAGNOSIS — Z9889 Other specified postprocedural states: Secondary | ICD-10-CM

## 2019-07-11 ENCOUNTER — Encounter: Payer: Self-pay | Admitting: Thoracic Surgery (Cardiothoracic Vascular Surgery)

## 2019-07-11 ENCOUNTER — Ambulatory Visit (INDEPENDENT_AMBULATORY_CARE_PROVIDER_SITE_OTHER): Payer: Self-pay | Admitting: Thoracic Surgery (Cardiothoracic Vascular Surgery)

## 2019-07-11 ENCOUNTER — Ambulatory Visit
Admission: RE | Admit: 2019-07-11 | Discharge: 2019-07-11 | Disposition: A | Discharge status: Home / Self Care | Payer: Medicare Other | Source: Ambulatory Visit | Attending: Thoracic Surgery (Cardiothoracic Vascular Surgery) | Admitting: Thoracic Surgery (Cardiothoracic Vascular Surgery)

## 2019-07-11 ENCOUNTER — Other Ambulatory Visit: Payer: Self-pay

## 2019-07-11 VITALS — BP 142/88 | HR 80 | Temp 97.3°F | Resp 18 | Ht 69.5 in | Wt 169.4 lb

## 2019-07-11 DIAGNOSIS — Z9889 Other specified postprocedural states: Secondary | ICD-10-CM

## 2019-07-11 DIAGNOSIS — Z952 Presence of prosthetic heart valve: Secondary | ICD-10-CM | POA: Diagnosis not present

## 2019-07-11 DIAGNOSIS — M8589 Other specified disorders of bone density and structure, multiple sites: Secondary | ICD-10-CM | POA: Diagnosis not present

## 2019-07-11 NOTE — Progress Notes (Signed)
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  His early postoperative recovery in the hospital was notable for a brief episode of atrial fibrillation for which he was treated with amiodarone.  He was discharged from the hospital in sinus rhythm on the sixth postoperative day.  Since hospital discharge he has done very well.  His Coumadin dose has been monitored and adjusted through the Coumadin clinic.  His most recent INR was 2.8.  He was seen in follow-up by Ermalinda Barrios at [facility] on July 06, 2019 at which time he remained in sinus rhythm.  His amiodarone dose was decreased to 200 mg daily.  He returns to our office today for routine follow-up.  He has not yet had his routine follow-up echocardiogram performed.  He reports that he is doing very well.  He is walking a mile and a half twice a day without any problems or concerns.  His exercise tolerance and breathing continues to improve.  He no longer has any sort of pain or discomfort from his surgery.  He is delighted with his progress   Current Outpatient Medications  Medication Sig Dispense Refill  . amiodarone (PACERONE) 200 MG tablet Take 1 tablet (200 mg total) by mouth daily. 90 tablet 3  . Ascorbic Acid (VITAMIN C) 1000 MG tablet Take 1,000 mg by mouth daily.    . Cholecalciferol (VITAMIN D) 50 MCG (2000 UT) tablet Take 2,000 Units by mouth daily.    . Multiple Vitamin (MULTIVITAMIN WITH MINERALS) TABS tablet  Take 1 tablet by mouth daily.    Marland Kitchen warfarin (COUMADIN) 3 MG tablet Take 1 tablet (3 mg total) by mouth daily at 4 PM. 30 tablet 2  . zolpidem (AMBIEN) 10 MG tablet TAKE ONE TABLET AT BEDTIME AS NEEDED 90 tablet 0   No current facility-administered medications for this visit.      Physical Exam:   BP (!) 142/88 (BP Location: Left Arm, Patient Position: Sitting, Cuff Size: Normal)   Pulse 80   Temp (!) 97.3 F (36.3 C)   Resp 18   Ht 5' 9.5" (1.765 m)   Wt 169 lb 6.4 oz (76.8 kg)   SpO2 97% Comment: RA  BMI 24.66 kg/m   General:  Well-appearing  Chest:   Clear to auscultation  CV:   Regular rate and rhythm without murmur  Incisions:  Healing nicely  Abdomen:  Soft nontender  Extremities:  Warm and well-perfused  Diagnostic Tests:  CHEST - 2 VIEW  COMPARISON:  06/20/2019  FINDINGS: Left chest multi lead pacer. Mitral valve annulus. Both lungs are clear. The visualized skeletal structures are unremarkable.  IMPRESSION: No acute abnormality of the lungs.   Electronically Signed   By: Eddie Candle M.D.   On: 07/11/2019 12:27   Impression:  Patient is doing very well approximately 4 weeks status post minimally invasive mitral valve repair  Plan:  I have encouraged the patient to  continue to gradually increase his physical activity without any particular limitations.  He might benefit from participating in the cardiac rehab program.  We have not made any changes to his current medications but I have instructed him to stop taking amiodarone when his current prescription runs out.  He will need to notify the Coumadin clinic when he stops taking amiodarone in case his Coumadin dose needs to be adjusted.  He may resume driving an automobile.  He will return to our office for routine follow-up in approximately 2 months at which time we will review the results of his routine follow-up echocardiogram.  All of his questions have been addressed.    Valentina Gu. [name],  MD 07/11/2019 12:29 PM

## 2019-07-11 NOTE — Patient Instructions (Addendum)
Continue your dose of Amiodarone 200 mg daily until your current prescription runs out, then stop taking it altogether.  Notify the Coumadin clinic when you stop taking amiodarone.  Continue all other previous medications without any changes at this time  You may continue to gradually increase your physical activity as tolerated.  Refrain from any heavy lifting or strenuous use of your arms and shoulders until at least 8 weeks from the time of your surgery, and avoid activities that cause increased pain in your chest on the side of your surgical incision.  Otherwise you may continue to increase activities without any particular limitations.  Increase the intensity and duration of physical activity gradually.  You may return to driving an automobile as long as you are no longer requiring oral narcotic pain relievers during the daytime.  It would be wise to start driving only short distances during the daylight and gradually increase from there as you feel comfortable.  You are encouraged to enroll and participate in the outpatient cardiac rehab program beginning as soon as practical.  Endocarditis is a potentially serious infection of heart valves or inside lining of the heart.  It occurs more commonly in patients with diseased heart valves (such as patient's with aortic or mitral valve disease) and in patients who have undergone heart valve repair or replacement.  Certain surgical and dental procedures may put you at risk, such as dental cleaning, other dental procedures, or any surgery involving the respiratory, urinary, gastrointestinal tract, gallbladder or prostate gland.   To minimize your chances for develooping endocarditis, maintain good oral health and seek prompt medical attention for any infections involving the mouth, teeth, gums, skin or urinary tract.    Always notify your doctor or dentist about your underlying heart valve condition before having any invasive procedures. You will need  to take antibiotics before certain procedures, including all routine dental cleanings or other dental procedures.  Your cardiologist or dentist should prescribe these antibiotics for you to be taken ahead of time.

## 2019-07-15 ENCOUNTER — Telehealth (HOSPITAL_COMMUNITY): Payer: Self-pay

## 2019-07-15 NOTE — Telephone Encounter (Signed)
Called patient to see if he was interested in participating in the Cardiac Rehab Program. Patient stated yes. Patient will come in for orientation on 08/09/2019@8 :30am and will attend the 3:00pm exercise class.  Mailed homework package.

## 2019-07-18 ENCOUNTER — Ambulatory Visit: Payer: Medicare Other | Admitting: Thoracic Surgery (Cardiothoracic Vascular Surgery)

## 2019-07-18 DIAGNOSIS — Z125 Encounter for screening for malignant neoplasm of prostate: Secondary | ICD-10-CM | POA: Diagnosis not present

## 2019-07-18 DIAGNOSIS — M859 Disorder of bone density and structure, unspecified: Secondary | ICD-10-CM | POA: Diagnosis not present

## 2019-07-18 DIAGNOSIS — I480 Paroxysmal atrial fibrillation: Secondary | ICD-10-CM | POA: Diagnosis not present

## 2019-07-18 DIAGNOSIS — I700 Atherosclerosis of aorta: Secondary | ICD-10-CM | POA: Diagnosis not present

## 2019-07-18 DIAGNOSIS — I2510 Atherosclerotic heart disease of native coronary artery without angina pectoris: Secondary | ICD-10-CM | POA: Diagnosis not present

## 2019-07-18 DIAGNOSIS — R918 Other nonspecific abnormal finding of lung field: Secondary | ICD-10-CM | POA: Diagnosis not present

## 2019-07-18 DIAGNOSIS — E7849 Other hyperlipidemia: Secondary | ICD-10-CM | POA: Diagnosis not present

## 2019-07-18 DIAGNOSIS — Z1331 Encounter for screening for depression: Secondary | ICD-10-CM | POA: Diagnosis not present

## 2019-07-18 DIAGNOSIS — Z950 Presence of cardiac pacemaker: Secondary | ICD-10-CM | POA: Diagnosis not present

## 2019-07-18 DIAGNOSIS — I77810 Thoracic aortic ectasia: Secondary | ICD-10-CM | POA: Diagnosis not present

## 2019-07-18 DIAGNOSIS — Z0000 Encounter for general adult medical examination without abnormal findings: Secondary | ICD-10-CM | POA: Diagnosis not present

## 2019-07-18 DIAGNOSIS — I10 Essential (primary) hypertension: Secondary | ICD-10-CM | POA: Diagnosis not present

## 2019-07-20 ENCOUNTER — Ambulatory Visit (INDEPENDENT_AMBULATORY_CARE_PROVIDER_SITE_OTHER): Payer: Medicare Other | Admitting: *Deleted

## 2019-07-20 ENCOUNTER — Encounter: Payer: Self-pay | Admitting: Cardiology

## 2019-07-20 ENCOUNTER — Ambulatory Visit (INDEPENDENT_AMBULATORY_CARE_PROVIDER_SITE_OTHER): Payer: Medicare Other | Admitting: Cardiology

## 2019-07-20 ENCOUNTER — Other Ambulatory Visit: Payer: Self-pay

## 2019-07-20 VITALS — BP 128/62 | HR 79 | Ht 69.5 in | Wt 170.0 lb

## 2019-07-20 DIAGNOSIS — Z950 Presence of cardiac pacemaker: Secondary | ICD-10-CM

## 2019-07-20 DIAGNOSIS — I480 Paroxysmal atrial fibrillation: Secondary | ICD-10-CM | POA: Diagnosis not present

## 2019-07-20 DIAGNOSIS — R0683 Snoring: Secondary | ICD-10-CM | POA: Diagnosis not present

## 2019-07-20 DIAGNOSIS — Z5181 Encounter for therapeutic drug level monitoring: Secondary | ICD-10-CM | POA: Diagnosis not present

## 2019-07-20 DIAGNOSIS — E785 Hyperlipidemia, unspecified: Secondary | ICD-10-CM

## 2019-07-20 DIAGNOSIS — Z9889 Other specified postprocedural states: Secondary | ICD-10-CM | POA: Diagnosis not present

## 2019-07-20 DIAGNOSIS — I340 Nonrheumatic mitral (valve) insufficiency: Secondary | ICD-10-CM

## 2019-07-20 LAB — POCT INR: INR: 2.8 (ref 2.0–3.0)

## 2019-07-20 MED ORDER — COLCHICINE 0.6 MG PO TABS: 0.6000 mg | ORAL_TABLET | Freq: Two times a day (BID) | ORAL | 0 refills | Status: DC

## 2019-07-20 NOTE — Patient Instructions (Addendum)
Medication Instructions:   STOP TAKING AMIODARONE NOW  START TAKING COLCHICINE 0.6 MG BY MOUTH TWICE DAILY FOR 3 WEEKS ONLY.  *If you need a refill on your cardiac medications before your next appointment, please call your pharmacy*   Testing/Procedures:  Your physician has recommended that you have a sleep study. This test records several body functions during sleep, including: brain activity, eye movement, oxygen and carbon dioxide blood levels, heart rate and rhythm, breathing rate and rhythm, the flow of air through your mouth and nose, snoring, body muscle movements, and chest and belly movement. OUR SLEEP STUDY COORDINATOR NINA JONES WILL BE IN CONTACT WITH YOU, TO ARRANGE THIS TEST.  HER NUMBER IS [phone].   Scale:  0= Would never dose  1= Slight chance of dozing  2= Moderate chance of dozing  3= High chance of dozing   What is your chance of dozing while:  1) Sitting and reading? ___1_  2) Watching TV? __0__  3) Sitting, inactive in a public place (e.g.: a theater or meeting)? _0___  4) As a passenger in a car for an hour without a break? __0__  5) Lying down to rest in the afternoon when circumstances permit? 1____  6) Sitting and talking to someone? _0___  7) Sitting quietly after lunch without alcohol? __0__  8) In a car, while stopped for a few minutes in traffic? _0___   Total: __2____    Follow-Up:  3 MONTHS IN THE OFFICE WITH DR. [name]

## 2019-07-20 NOTE — Progress Notes (Signed)
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  Patient underwent mitral valve repair via right minithoracotomy 06/14/2019 by Dr.Owen.  He developed postop atrial fibrillation started on amiodarone and converted to normal sinus rhythm.  Preop cath no evidence of CAD.  Normal right and left heart pressures.  Patient comes in for f/u. Walking 1 mile twice a day. Denies pain, palpitations, dizziness or edema.  Wants to start golfing again and driving.  [id number] -patient is coming for follow-up, he is looking and feeling great, he has no palpitations, no difficulty breathing, he walks 5 miles every day without any significant effort or difficulties.  No dizziness no falls no lower extremity edema.  No bleeding on warfarin.  He states that his wife has been telling him he is snoring and he would like to be referred for sleep study.  Past Medical History:  Diagnosis Date  . Heart murmur   . Hyperlipidemia   . Hypertension   . Mitral regurgitation   . MVP (mitral valve prolapse)   . Presence of permanent cardiac pacemaker   . Syncope     Past Surgical History:  Procedure Laterality Date  . BUBBLE STUDY  05/23/2019   Procedure: BUBBLE STUDY;  Surgeon: [name], MD;  Location: [place] [facility];  Service: Cardiovascular;;  . HERNIA REPAIR    . MITRAL VALVE REPAIR Right 06/14/2019   Procedure: MINIMALLY INVASIVE MITRAL VALVE REPAIR (MVR)<TEE;  Surgeon: [name], MD;  Location: [facility];  Service: Open Heart Surgery;  Laterality: Right;  . PACEMAKER IMPLANT N/A  10/12/2017   Procedure: PACEMAKER IMPLANT;  Surgeon: Constance Haw, MD;  Location: [facility];  Service: Cardiovascular;  Laterality: N/A;  . RIGHT/LEFT HEART CATH AND CORONARY ANGIOGRAPHY N/A 06/06/2019   Procedure: RIGHT/LEFT HEART CATH AND CORONARY ANGIOGRAPHY;  Surgeon: Burnell Blanks, MD;  Location: [facility];  Service: Cardiovascular;  Laterality: N/A;  . TEE WITHOUT CARDIOVERSION N/A 05/23/2019   Procedure: TRANSESOPHAGEAL ECHOCARDIOGRAM (TEE);  Surgeon: [name], MD;  Location: [place];  Service: Cardiovascular;  Laterality: N/A;  . TEE WITHOUT CARDIOVERSION N/A 06/14/2019   Procedure: TRANSESOPHAGEAL ECHOCARDIOGRAM (TEE);  Surgeon: [name], MD;  Location: [facility];  Service: Open Heart Surgery;  Laterality: N/A;  . TONSILECTOMY, ADENOIDECTOMY, BILATERAL MYRINGOTOMY AND TUBES     as child  . TONSILLECTOMY    . TOTAL KNEE ARTHROPLASTY Left   . TOTAL KNEE ARTHROPLASTY Left 02/23/2019   Procedure: TOTAL KNEE ARTHROPLASTY;  Surgeon: Gaynelle Arabian, MD;  Location: [facility];  Service: Orthopedics;  Laterality: Left;  39min    Current Medications: Current Meds  Medication Sig  . Ascorbic Acid (VITAMIN C) 1000 MG tablet Take 1,000 mg by mouth daily.  . Cholecalciferol (VITAMIN D) 50 MCG (2000 UT) tablet Take 2,000 Units by mouth daily.  . Multiple Vitamin (MULTIVITAMIN WITH MINERALS) TABS tablet Take 1 tablet by mouth daily.  Marland Kitchen warfarin (COUMADIN) 3 MG tablet Take 1 tablet (3 mg total) by mouth daily at 4 PM.  . zolpidem (AMBIEN) 10 MG tablet  TAKE ONE TABLET AT BEDTIME AS NEEDED  . [DISCONTINUED] amiodarone (PACERONE) 200 MG tablet Take 1 tablet (200 mg total) by mouth daily.     Allergies:   Patient has no known allergies.   Social History   Socioeconomic History  . Marital status: Married    Spouse name: Not on file  . Number of children: Not on file  . Years of education: Not on file  . Highest education level: Not on file    Occupational History  . Not on file  Tobacco Use  . Smoking status: Never Smoker  . Smokeless tobacco: Never Used  Substance and Sexual Activity  . Alcohol use: Yes    Alcohol/week: 5.0 standard drinks    Types: 5 Glasses of wine per week  . Drug use: No  . Sexual activity: Not on file  Other Topics Concern  . Not on file  Social History Narrative  . Not on file   Social Determinants of Health   Financial Resource Strain:   . Difficulty of Paying Living Expenses:   Food Insecurity:   . Worried About Charity fundraiser in the Last Year:   . Arboriculturist in the Last Year:   Transportation Needs:   . Film/video editor (Medical):   Marland Kitchen Lack of Transportation (Non-Medical):   Physical Activity:   . Days of Exercise per Week:   . Minutes of Exercise per Session:   Stress:   . Feeling of Stress :   Social Connections:   . Frequency of Communication with Friends and Family:   . Frequency of Social Gatherings with Friends and Family:   . Attends Religious Services:   . Active Member of Clubs or Organizations:   . Attends Archivist Meetings:   Marland Kitchen Marital Status:      Family History:  The patient's   family history includes Alzheimer's disease in his mother; Hypertension in his father.   ROS:   Please see the history of present illness.    ROS All other systems reviewed and are negative.   PHYSICAL EXAM:   VS:  BP 128/62   Pulse 79   Ht 5' 9.5" (1.765 m)   Wt 170 lb (77.1 kg)   SpO2 96%   BMI 24.74 kg/m   Physical Exam  [id number], in no acute distress  Neck: no JVD, carotid bruits, or masses Cardiac:RRR; minimal   Respiratory:  clear to auscultation bilaterally, normal work of breathing GI: soft, nontender, nondistended, + BS Ext: without cyanosis, clubbing, or edema, Good distal pulses bilaterally MS: no deformity or atrophy  Skin: warm and dry, no rash Neuro:  Alert and Oriented x 3 Psych: euthymic mood, full affect  Wt Readings from Last 3  Encounters:  07/20/19 170 lb (77.1 kg)  07/11/19 169 lb 6.4 oz (76.8 kg)  07/06/19 166 lb 9.6 oz (75.6 kg)      Studies/Labs Reviewed:   EKG:  EKG is ordered today.  It shows normal sinus rhythm, right bundle branch block, previously A. fib.  This was personally reviewed.  Recent Labs: 02/10/2019: NT-Pro BNP 139; TSH 1.310 06/10/2019: ALT 18 06/15/2019: Magnesium 2.6 06/19/2019: BUN 11; Creatinine, Ser 0.77; Hemoglobin 10.9; Platelets 154; Potassium 3.8; Sodium 140   Lipid Panel    Component Value Date/Time   CHOL 169 02/10/2019 1429   TRIG 80 02/10/2019 1429   HDL 81 02/10/2019 1429   CHOLHDL 2.1 02/10/2019 1429   LDLCALC 73 02/10/2019 1429  Additional studies/ records that were reviewed today include:         CARDIOTHORACIC SURGERY OPERATIVE NOTE   Date of Procedure:                06/14/2019   Preoperative Diagnosis:      Severe Mitral Regurgitation  Minimally-Invasive Mitral Valve Repair             Complex valvuloplasty including artificial Gore-tex neochord placement x12             Sorin Memo 4D Ring Annuloplasty (size 79mm, catalog # [id number], serial # [id number])           Surgeon:        Valentina Gu. [name], MD Operative Findings: ? Barlow's type myxomatous degenerative disease ? Bileaflet prolapse with multiple elongated chordae tendinae ? Type II dysfunction with severe mitral regurgitation ? Normal left ventricular systolic function ? No residual mitral regurgitation after successful valve repair  Cardiac catheterization 06/06/2019 1. No angiographic evidence of CAD 2. Normal right and left sided pressures   Continue with planning for mitral valve surgery.    ASSESSMENT:    1. Snoring   2. S/P mitral valve repair   3. Status post mitral valve repair   4. Paroxysmal atrial fibrillation (HCC)   5. History of pacemaker   6. Severe mitral regurgitation   7. 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  He is postop echo is scheduled for this Friday, 07/22/2019.  We will follow. His scar looks great, there is no discharge or erythema.  He has mild pleuritic type of pain predominantly at night, we will start couple weeks of colchicine 0.6 mg PO BID.  He will continue Coumadin for total of 3 months, after that we will continue aspirin 81 mg only.  Postop atrial fibrillation on amiodarone has not had recurrence.  He remains in sinus rhythm.  I will discontinue amiodarone today.  Status post pacemaker for sinus arrest  Hyperlipidemia Crestor stopped because of memory impairment calcium score was 94 which was 27th percentile but cardiac cath no evidence of coronary disease  Snoring -we will refer for sleep study.  Medication Adjustments/Labs and Tests Ordered: Current medicines are reviewed at length with the patient today.  Concerns regarding medicines are outlined above.  Medication changes, Labs and Tests ordered today are listed in the Patient Instructions below. Patient Instructions  Medication Instructions:   STOP TAKING AMIODARONE NOW  START TAKING COLCHICINE 0.6 MG BY MOUTH TWICE DAILY FOR 3 WEEKS ONLY.  *If you need a refill on your cardiac medications before your next appointment, please call your pharmacy*   Testing/Procedures:  Your physician has recommended that you have a sleep study. This test records several body functions during sleep, including: brain activity, eye movement, oxygen and carbon dioxide blood levels, heart rate and rhythm, breathing rate and rhythm, the flow of air through your mouth and nose, snoring, body muscle movements, and chest and belly movement. OUR SLEEP STUDY COORDINATOR NINA JONES WILL BE IN CONTACT WITH YOU, TO ARRANGE THIS TEST.  HER NUMBER IS  [phone].   Scale:  0= Would never dose  1= Slight chance of dozing  2= Moderate chance of dozing  3= High chance of dozing  What is your chance of dozing while:  1) Sitting and reading? ___1_  2) Watching TV? __0__  3) Sitting, inactive in a public place (e.g.: a theater or meeting)? _0___  4) As a passenger in a car for an hour without a break? __0__  5) Lying down to rest in the afternoon when circumstances permit? 1____  6) Sitting and talking to someone? _0___  7) Sitting quietly after lunch without alcohol? __0__  8) In a car, while stopped for a few minutes in traffic? _0___   Total: __2____    Follow-Up:  3 MONTHS IN THE OFFICE WITH DR. Kate Sable, [name], MD  07/20/2019 10:37 AM    [address], [place], [place]  [address] Phone: [phone]; Fax: [phone]

## 2019-07-20 NOTE — Patient Instructions (Signed)
Description   Continue on same dosage 1 tablet daily except for 1.5 tablets on Sundays. Recheck INR in 3 weeks. Coumadin Clinic [phone]

## 2019-07-22 ENCOUNTER — Telehealth: Payer: Self-pay | Admitting: Cardiology

## 2019-07-22 ENCOUNTER — Other Ambulatory Visit (HOSPITAL_COMMUNITY): Payer: Medicare Other

## 2019-07-22 NOTE — Telephone Encounter (Signed)
Informed the pt that per Dr. [name], it is ok to wait for his echo, as long as its 2 months from his surgery, which it will be. Pt verbalized understanding and agrees with this plan.

## 2019-07-22 NOTE — Telephone Encounter (Signed)
New Message:     Pt is scheduled for an Echo on 08-10-19. His concern is, does Dr [name] thinks he need the Echo before 08-10-19?

## 2019-07-22 NOTE — Telephone Encounter (Signed)
Will forward this request to Dr. [name] to further review and advise on.  Will follow-up with the pt accordingly thereafter.

## 2019-07-22 NOTE — Telephone Encounter (Signed)
No problem as long as he gets it within 2 months from surgery

## 2019-07-22 NOTE — Telephone Encounter (Signed)
Patient is calling to follow up regarding echocardiogram. He states he has additional questions for Ivy. Please call.

## 2019-07-22 NOTE — Telephone Encounter (Signed)
Pt is calling to ask Dr. [name] if its safe for him to get his echo on 08/10/19, for he was originally scheduled to have this done today, but had to reschedule due to a scheduling conflict.  Pt states the echo tech rescheduled him to 08/10/19, and he just wants to make sure this is ok to wait until then, or should the echo appt be moved up to a sooner date.  Pt states he's ok either way. Informed the pt that I will route this message to Dr. [name] to review and advise on, and follow-up accordingly with him, thereafter.  Pt verbalized understanding and agrees with this plan.  Pt apologized for not being able to attend his echo appt today.

## 2019-07-25 DIAGNOSIS — R972 Elevated prostate specific antigen [PSA]: Secondary | ICD-10-CM | POA: Diagnosis not present

## 2019-07-25 DIAGNOSIS — I480 Paroxysmal atrial fibrillation: Secondary | ICD-10-CM | POA: Diagnosis not present

## 2019-07-25 DIAGNOSIS — I2510 Atherosclerotic heart disease of native coronary artery without angina pectoris: Secondary | ICD-10-CM | POA: Diagnosis not present

## 2019-07-25 DIAGNOSIS — Z950 Presence of cardiac pacemaker: Secondary | ICD-10-CM | POA: Diagnosis not present

## 2019-07-26 ENCOUNTER — Telehealth: Payer: Self-pay | Admitting: *Deleted

## 2019-07-26 DIAGNOSIS — R972 Elevated prostate specific antigen [PSA]: Secondary | ICD-10-CM | POA: Diagnosis not present

## 2019-07-26 NOTE — Telephone Encounter (Signed)
-----   Message from Nuala Alpha, LPN sent at [id number] 10:06 AM EDT ----- Regarding: DR. Irven Shelling FOR THIS PT TO HAVE A SLEEP STUDY DONE Dr. [name] saw this pt in clinic today and ordered for him to have a sleep study done for snoring. Order is in and pt is aware you will call to arrange. EPWORTH score is as mentioned below:  please call and arrange then shoot me the date you scheduled thereafter.  Thanks, Ivy   EPWORTH SCORE:  Scale:  0= Would never dose  1= Slight chance of dozing  2= Moderate chance of dozing  3= High chance of dozing   What is your chance of dozing while:  1) Sitting and reading? ___1_  2) Watching TV? __0__  3) Sitting, inactive in a public place (e.g.: a theater or meeting)? _0___  4) As a passenger in a car for an hour without a break? __0__  5) Lying down to rest in the afternoon when circumstances permit? 1____  6) Sitting and talking to someone? _0___  7) Sitting quietly after lunch without alcohol? __0__  8) In a car, while stopped for a few minutes in traffic? _0___   Total: __2____

## 2019-08-03 ENCOUNTER — Telehealth (HOSPITAL_COMMUNITY): Payer: Self-pay | Admitting: Pharmacist

## 2019-08-03 ENCOUNTER — Other Ambulatory Visit (HOSPITAL_COMMUNITY): Payer: Self-pay | Admitting: Urology

## 2019-08-03 ENCOUNTER — Other Ambulatory Visit: Payer: Self-pay | Admitting: Urology

## 2019-08-03 DIAGNOSIS — R972 Elevated prostate specific antigen [PSA]: Secondary | ICD-10-CM

## 2019-08-05 ENCOUNTER — Other Ambulatory Visit: Payer: Self-pay

## 2019-08-05 ENCOUNTER — Telehealth: Payer: Self-pay | Admitting: Cardiology

## 2019-08-05 ENCOUNTER — Telehealth (HOSPITAL_COMMUNITY): Payer: Self-pay | Admitting: *Deleted

## 2019-08-05 ENCOUNTER — Encounter (HOSPITAL_COMMUNITY)
Admission: RE | Admit: 2019-08-05 | Discharge: 2019-08-05 | Disposition: A | Discharge status: Home / Self Care | Payer: Medicare Other | Source: Ambulatory Visit | Attending: Cardiology | Admitting: Cardiology

## 2019-08-05 NOTE — Telephone Encounter (Signed)
Spoke with pt and he was actually calling to see if he was suppose to be on Amiodarone.  He gave the wrong medication name on the first call.  Reviewed Dr. Francesca Oman note and made him aware that he should not be taking Amiodarone.  Dr. [name] d/c'ed at last [place].  He has not been taking but wanted to make sure this was correct. Pt verbalized understanding and was appreciative for call.

## 2019-08-05 NOTE — Telephone Encounter (Signed)
Spoke with Dr and Mrs Biby. Confirmed appointment for 08/09/19. Patient just completed a 4 mile walk. Will call back this afternoon to complete health history.Barnet Pall, RN,BSN 08/05/2019 2:35 PM

## 2019-08-05 NOTE — Telephone Encounter (Signed)
Patient is requesting to speak with Dr. Francesca Oman nurse, Karlene Einstein. He states he would like to inquire about whether or not he should be taking Crestor medication. Please call to advise.

## 2019-08-05 NOTE — Telephone Encounter (Signed)
Spoke with patient completed health history for cardiac rehab orientation over the phone.Barnet Pall, RN,BSN 08/05/2019 4:47 PM

## 2019-08-06 NOTE — Telephone Encounter (Signed)
Cardiac Rehab Medication Review by a Pharmacist  Does the patient  feel that his/her medications are working for him/her?  yes  Has the patient been experiencing any side effects to the medications prescribed?  no  Does the patient measure his/her own blood pressure or blood glucose at home?  no   Does the patient have any problems obtaining medications due to transportation or finances?   no  Understanding of regimen: good Understanding of indications: good Potential of compliance: good  Richardine Service, PharmD PGY1 Pharmacy Resident Phone: [phone] 08/06/2019  2:05 PM  Please check [email or web address] for unit-specific pharmacy phone numbers.

## 2019-08-08 ENCOUNTER — Telehealth: Payer: Self-pay | Admitting: *Deleted

## 2019-08-08 NOTE — Telephone Encounter (Signed)
Staff message sent to Nina ok to schedule sleep study. No PA is required. 

## 2019-08-09 ENCOUNTER — Encounter (HOSPITAL_COMMUNITY): Payer: Self-pay

## 2019-08-09 ENCOUNTER — Other Ambulatory Visit: Payer: Self-pay

## 2019-08-09 ENCOUNTER — Encounter (HOSPITAL_COMMUNITY)
Admission: RE | Admit: 2019-08-09 | Discharge: 2019-08-09 | Disposition: A | Discharge status: Home / Self Care | Payer: Medicare Other | Source: Ambulatory Visit | Attending: Cardiology | Admitting: Cardiology

## 2019-08-09 VITALS — BP 114/70 | HR 81 | Resp 18 | Ht 69.5 in | Wt 169.3 lb

## 2019-08-09 DIAGNOSIS — I10 Essential (primary) hypertension: Secondary | ICD-10-CM | POA: Insufficient documentation

## 2019-08-09 DIAGNOSIS — E785 Hyperlipidemia, unspecified: Secondary | ICD-10-CM | POA: Insufficient documentation

## 2019-08-09 DIAGNOSIS — Z9889 Other specified postprocedural states: Secondary | ICD-10-CM

## 2019-08-09 DIAGNOSIS — I340 Nonrheumatic mitral (valve) insufficiency: Secondary | ICD-10-CM | POA: Insufficient documentation

## 2019-08-09 DIAGNOSIS — Z950 Presence of cardiac pacemaker: Secondary | ICD-10-CM | POA: Insufficient documentation

## 2019-08-09 DIAGNOSIS — Z79899 Other long term (current) drug therapy: Secondary | ICD-10-CM | POA: Insufficient documentation

## 2019-08-09 DIAGNOSIS — Z954 Presence of other heart-valve replacement: Secondary | ICD-10-CM | POA: Insufficient documentation

## 2019-08-09 DIAGNOSIS — Z7901 Long term (current) use of anticoagulants: Secondary | ICD-10-CM | POA: Insufficient documentation

## 2019-08-09 DIAGNOSIS — I341 Nonrheumatic mitral (valve) prolapse: Secondary | ICD-10-CM | POA: Insufficient documentation

## 2019-08-09 DIAGNOSIS — R55 Syncope and collapse: Secondary | ICD-10-CM | POA: Insufficient documentation

## 2019-08-09 NOTE — Progress Notes (Signed)
Cardiac Individual Treatment Plan  Patient Details  Name: Alexander [name] MRN: [id number] Date of Birth: Oct 30, 1940 Referring Provider:     CARDIAC REHAB PHASE II ORIENTATION from 08/09/2019 in [place]  Referring Provider  [name], MD      Initial Encounter Date:    CARDIAC REHAB PHASE II ORIENTATION from 08/09/2019 in [facility]  Date  08/09/19      Visit Diagnosis: S/P mitral valve repair  Patient's Home Medications on Admission:  Current Outpatient Medications:    Ascorbic Acid (VITAMIN C) 1000 MG tablet, Take 1,000 mg by mouth daily., Disp: , Rfl:    colchicine 0.6 MG tablet, Take 1 tablet (0.6 mg total) by mouth 2 (two) times daily. Take for 3 weeks only., Disp: 42 tablet, Rfl: 0   Multiple Vitamin (MULTIVITAMIN WITH MINERALS) TABS tablet, Take 1 tablet by mouth daily., Disp: , Rfl:    warfarin (COUMADIN) 3 MG tablet, Take 1 tablet (3 mg total) by mouth daily at 4 PM., Disp: 30 tablet, Rfl: 2   ezetimibe (ZETIA) 10 MG tablet, Take 10 mg by mouth daily., Disp: , Rfl:   Past Medical History: Past Medical History:  Diagnosis Date   Heart murmur    Hyperlipidemia    Hypertension    Mitral regurgitation    MVP (mitral valve prolapse)    Presence of permanent cardiac pacemaker    Syncope     Tobacco Use: Social History   Tobacco Use  Smoking Status Never Smoker  Smokeless Tobacco Never Used    Labs: Recent Review Flowsheet Data    Labs for ITP Cardiac and Pulmonary Rehab Latest Ref Rng & Units 06/14/2019 06/14/2019 06/14/2019 06/14/2019 06/14/2019   Cholestrol 100 - 199 mg/dL - - - - -   LDLCALC 0 - 99 mg/dL - - - - -   HDL >39 mg/dL - - - - -   Trlycerides 0 - 149 mg/dL - - - - -   Hemoglobin A1c 4.8 - 5.6 % - - - - -   PHART 7.350 - 7.450 7.380 - 7.281(L) 7.304(L) -   PCO2ART 32.0 - 48.0 mmHg 40.9 - 54.9(H) 48.3(H) -   HCO3 20.0 - 28.0 mmol/L 24.2 - 26.1 24.2 -   TCO2 22 - 32  mmol/L 25 24 28 26 26    ACIDBASEDEF 0.0 - 2.0 mmol/L 1.0 - 2.0 3.0(H) -   O2SAT % 99.0 - 100.0 98.0 -      Capillary Blood Glucose: Lab Results  Component Value Date   GLUCAP 115 (H) 06/18/2019   GLUCAP 85 06/18/2019   GLUCAP 99 06/17/2019   GLUCAP 109 (H) 06/17/2019   GLUCAP 90 06/17/2019     Exercise Target Goals: Exercise Program Goal: Individual exercise prescription set using results from initial 6 min walk test and THRR while considering  patients activity barriers and safety.   Exercise Prescription Goal: Initial exercise prescription builds to 30-45 minutes a day of aerobic activity, 2-3 days per week.  Home exercise guidelines will be given to patient during program as part of exercise prescription that the participant will acknowledge.  Activity Barriers & Risk Stratification: Activity Barriers & Cardiac Risk Stratification - 08/09/19 1120      Activity Barriers & Cardiac Risk Stratification   Activity Barriers  Shortness of Breath    Cardiac Risk Stratification  Moderate       6 Minute Walk: 6 Minute Walk  Benjamin Name 08/09/19 0931         6 Minute Walk   Phase  Initial     Distance  1890 feet     Walk Time  6 minutes     # of Rest Breaks  0     MPH  3.58     METS  4.12     RPE  11     Perceived Dyspnea   0     VO2 Peak  14.43     Symptoms  No     Resting HR  81 bpm     Resting BP  114/70     Resting Oxygen Saturation   98 %     Exercise Oxygen Saturation  during 6 min walk  97 %     Max Ex. HR  121 bpm     Max Ex. BP  166/72     2 Minute Post BP  124/84        Oxygen Initial Assessment:   Oxygen Re-Evaluation:   Oxygen Discharge (Final Oxygen Re-Evaluation):   Initial Exercise Prescription: Initial Exercise Prescription - 08/09/19 1100      Date of Initial Exercise RX and Referring Provider   Date  08/09/19    Referring Provider  [name], MD    Expected Discharge Date  10/07/19      Treadmill   MPH  2.8    Grade  0     Minutes  15    METs  3.14      NuStep   Level  3    SPM  85    Minutes  15    METs  3      Prescription Details   Frequency (times per week)  3    Duration  Progress to 30 minutes of continuous aerobic without signs/symptoms of physical distress      Intensity   THRR 40-80% of Max Heartrate  56-112    Ratings of Perceived Exertion  11-13    Perceived Dyspnea  0-4      Progression   Progression  Continue to progress workloads to maintain intensity without signs/symptoms of physical distress.      Resistance Training   Training Prescription  Yes    Weight  4lbs    Reps  10-15       Perform Capillary Blood Glucose checks as needed.  Exercise Prescription Changes:   Exercise Comments:   Exercise Goals and Review:  Exercise Goals    Row Name 08/09/19 0920             Exercise Goals   Increase Physical Activity  Yes       Intervention  Provide advice, education, support and counseling about physical activity/exercise needs.;Develop an individualized exercise prescription for aerobic and resistive training based on initial evaluation findings, risk stratification, comorbidities and participant's personal goals.       Expected Outcomes  Short Term: Attend rehab on a regular basis to increase amount of physical activity.;Long Term: Exercising regularly at least 3-5 days a week.;Long Term: Add in home exercise to make exercise part of routine and to increase amount of physical activity.       Increase Strength and Stamina  Yes       Intervention  Provide advice, education, support and counseling about physical activity/exercise needs.;Develop an individualized exercise prescription for aerobic and resistive training based on initial evaluation findings, risk stratification, comorbidities and participant's personal goals.  Expected Outcomes  Short Term: Increase workloads from initial exercise prescription for resistance, speed, and METs.;Short Term: Perform resistance  training exercises routinely during rehab and add in resistance training at home;Long Term: Improve cardiorespiratory fitness, muscular endurance and strength as measured by increased METs and functional capacity (6MWT)       Able to understand and use rate of perceived exertion (RPE) scale  Yes       Intervention  Provide education and explanation on how to use RPE scale       Expected Outcomes  Short Term: Able to use RPE daily in rehab to express subjective intensity level;Long Term:  Able to use RPE to guide intensity level when exercising independently       Knowledge and understanding of Target Heart Rate Range (THRR)  Yes       Intervention  Provide education and explanation of THRR including how the numbers were predicted and where they are located for reference       Expected Outcomes  Short Term: Able to state/look up THRR;Long Term: Able to use THRR to govern intensity when exercising independently;Short Term: Able to use daily as guideline for intensity in rehab       Able to check pulse independently  Yes       Intervention  Provide education and demonstration on how to check pulse in carotid and radial arteries.;Review the importance of being able to check your own pulse for safety during independent exercise       Expected Outcomes  Short Term: Able to explain why pulse checking is important during independent exercise;Long Term: Able to check pulse independently and accurately       Understanding of Exercise Prescription  Yes       Intervention  Provide education, explanation, and written materials on patient's individual exercise prescription       Expected Outcomes  Short Term: Able to explain program exercise prescription;Long Term: Able to explain home exercise prescription to exercise independently          Exercise Goals Re-Evaluation :   Discharge Exercise Prescription (Final Exercise Prescription Changes):   Nutrition:  Target Goals: Understanding of nutrition  guidelines, daily intake of sodium 1500mg , cholesterol 200mg , calories 30% from fat and 7% or less from saturated fats, daily to have 5 or more servings of fruits and vegetables.  Biometrics: Pre Biometrics - 08/09/19 0840      Pre Biometrics   Waist Circumference  33.5 inches    Hip Circumference  41 inches    Waist to Hip Ratio  0.82 %    Triceps Skinfold  19 mm    % Body Fat  24.6 %    Grip Strength  40 kg    Flexibility  17.75 in    Single Leg Stand  6.5 seconds        Nutrition Therapy Plan and Nutrition Goals:   Nutrition Assessments:   Nutrition Goals Re-Evaluation:   Nutrition Goals Re-Evaluation:   Nutrition Goals Discharge (Final Nutrition Goals Re-Evaluation):   Psychosocial: Target Goals: Acknowledge presence or absence of significant depression and/or stress, maximize coping skills, provide positive support system. Participant is able to verbalize types and ability to use techniques and skills needed for reducing stress and depression.  Initial Review & Psychosocial Screening: Initial Psych Review & Screening - 08/09/19 1042      Initial Review   Current issues with  None Identified      Family Dynamics   Good Support  System?  Yes    Comments  Mr. Morelan presents to his cardiac rehab orientation appointment with a positive attitude and outlook. Denies psychosocial needs and admits to a strong support system of family and friends. No interventions needed at this time.      Barriers   Psychosocial barriers to participate in program  There are no identifiable barriers or psychosocial needs.      Screening Interventions   Interventions  Encouraged to exercise       Quality of Life Scores: Quality of Life - 08/09/19 1123      Quality of Life   Select  Quality of Life      Quality of Life Scores   Health/Function Pre  28.5 %    Socioeconomic Pre  25.42 %    Psych/Spiritual Pre  27.86 %    Family Pre  28.8 %    GLOBAL Pre  27.81 %       Scores of 19 and below usually indicate a poorer quality of life in these areas.  A difference of  2-3 points is a clinically meaningful difference.  A difference of 2-3 points in the total score of the Quality of Life Index has been associated with significant improvement in overall quality of life, self-image, physical symptoms, and general health in studies assessing change in quality of life.  PHQ-9: Recent Review Flowsheet Data    Depression screen [facility] 2/9 08/09/2019   Decreased Interest 0   Down, Depressed, Hopeless 0   PHQ - 2 Score 0     Interpretation of Total Score  Total Score Depression Severity:  1-4 = Minimal depression, 5-9 = Mild depression, 10-14 = Moderate depression, 15-19 = Moderately severe depression, 20-27 = Severe depression   Psychosocial Evaluation and Intervention:   Psychosocial Re-Evaluation:   Psychosocial Discharge (Final Psychosocial Re-Evaluation):   Vocational Rehabilitation: Provide vocational rehab assistance to qualifying candidates.   Vocational Rehab Evaluation & Intervention:   Education: Education Goals: Education classes will be provided on a weekly basis, covering required topics. Participant will state understanding/return demonstration of topics presented.  Learning Barriers/Preferences:   Education Topics: Count Your Pulse:  -Group instruction provided by verbal instruction, demonstration, patient participation and written materials to support subject.  Instructors address importance of being able to find your pulse and how to count your pulse when at home without a heart monitor.  Patients get hands on experience counting their pulse with staff help and individually.   Heart Attack, Angina, and Risk Factor Modification:  -Group instruction provided by verbal instruction, video, and written materials to support subject.  Instructors address signs and symptoms of angina and heart attacks.    Also discuss risk factors for heart  disease and how to make changes to improve heart health risk factors.   Functional Fitness:  -Group instruction provided by verbal instruction, demonstration, patient participation, and written materials to support subject.  Instructors address safety measures for doing things around the house.  Discuss how to get up and down off the floor, how to pick things up properly, how to safely get out of a chair without assistance, and balance training.   Meditation and Mindfulness:  -Group instruction provided by verbal instruction, patient participation, and written materials to support subject.  Instructor addresses importance of mindfulness and meditation practice to help reduce stress and improve awareness.  Instructor also leads participants through a meditation exercise.    Stretching for Flexibility and Mobility:  -Group instruction provided by verbal  instruction, patient participation, and written materials to support subject.  Instructors lead participants through series of stretches that are designed to increase flexibility thus improving mobility.  These stretches are additional exercise for major muscle groups that are typically performed during regular warm up and cool down.   Hands Only CPR:  -Group verbal, video, and participation provides a basic overview of AHA guidelines for community CPR. Role-play of emergencies allow participants the opportunity to practice calling for help and chest compression technique with discussion of AED use.   Hypertension: -Group verbal and written instruction that provides a basic overview of hypertension including the most recent diagnostic guidelines, risk factor reduction with self-care instructions and medication management.    Nutrition I class: Heart Healthy Eating:  -Group instruction provided by PowerPoint slides, verbal discussion, and written materials to support subject matter. The instructor gives an explanation and review of the  Therapeutic Lifestyle Changes diet recommendations, which includes a discussion on lipid goals, dietary fat, sodium, fiber, plant stanol/sterol esters, sugar, and the components of a well-balanced, healthy diet.   Nutrition II class: Lifestyle Skills:  -Group instruction provided by PowerPoint slides, verbal discussion, and written materials to support subject matter. The instructor gives an explanation and review of label reading, grocery shopping for heart health, heart healthy recipe modifications, and ways to make healthier choices when eating out.   Diabetes Question & Answer:  -Group instruction provided by PowerPoint slides, verbal discussion, and written materials to support subject matter. The instructor gives an explanation and review of diabetes co-morbidities, pre- and post-prandial blood glucose goals, pre-exercise blood glucose goals, signs, symptoms, and treatment of hypoglycemia and hyperglycemia, and foot care basics.   Diabetes Blitz:  -Group instruction provided by PowerPoint slides, verbal discussion, and written materials to support subject matter. The instructor gives an explanation and review of the physiology behind type 1 and type 2 diabetes, diabetes medications and rational behind using different medications, pre- and post-prandial blood glucose recommendations and Hemoglobin A1c goals, diabetes diet, and exercise including blood glucose guidelines for exercising safely.    Portion Distortion:  -Group instruction provided by PowerPoint slides, verbal discussion, written materials, and food models to support subject matter. The instructor gives an explanation of serving size versus portion size, changes in portions sizes over the last 20 years, and what consists of a serving from each food group.   Stress Management:  -Group instruction provided by verbal instruction, video, and written materials to support subject matter.  Instructors review role of stress in heart  disease and how to cope with stress positively.     Exercising on Your Own:  -Group instruction provided by verbal instruction, power point, and written materials to support subject.  Instructors discuss benefits of exercise, components of exercise, frequency and intensity of exercise, and end points for exercise.  Also discuss use of nitroglycerin and activating EMS.  Review options of places to exercise outside of rehab.  Review guidelines for sex with heart disease.   Cardiac Drugs I:  -Group instruction provided by verbal instruction and written materials to support subject.  Instructor reviews cardiac drug classes: antiplatelets, anticoagulants, beta blockers, and statins.  Instructor discusses reasons, side effects, and lifestyle considerations for each drug class.   Cardiac Drugs II:  -Group instruction provided by verbal instruction and written materials to support subject.  Instructor reviews cardiac drug classes: angiotensin converting enzyme inhibitors (ACE-I), angiotensin II receptor blockers (ARBs), nitrates, and calcium channel blockers.  Instructor discusses reasons, side effects, and  lifestyle considerations for each drug class.   Anatomy and Physiology of the Circulatory System:  Group verbal and written instruction and models provide basic cardiac anatomy and physiology, with the coronary electrical and arterial systems. Review of: AMI, Angina, Valve disease, Heart Failure, Peripheral Artery Disease, Cardiac Arrhythmia, Pacemakers, and the ICD.   Other Education:  -Group or individual verbal, written, or video instructions that support the educational goals of the cardiac rehab program.   Holiday Eating Survival Tips:  -Group instruction provided by PowerPoint slides, verbal discussion, and written materials to support subject matter. The instructor gives patients tips, tricks, and techniques to help them not only survive but enjoy the holidays despite the onslaught of food  that accompanies the holidays.   Knowledge Questionnaire Score: Knowledge Questionnaire Score - 08/09/19 1124      Knowledge Questionnaire Score   Pre Score  21/24       Core Components/Risk Factors/Patient Goals at Admission: Personal Goals and Risk Factors at Admission - 08/09/19 1125      Core Components/Risk Factors/Patient Goals on Admission   Improve shortness of breath with ADL's  Yes    Intervention  Provide education, individualized exercise plan and daily activity instruction to help decrease symptoms of SOB with activities of daily living.    Expected Outcomes  Short Term: Improve cardiorespiratory fitness to achieve a reduction of symptoms when performing ADLs;Long Term: Be able to perform more ADLs without symptoms or delay the onset of symptoms    Hypertension  Yes    Intervention  Provide education on lifestyle modifcations including regular physical activity/exercise, weight management, moderate sodium restriction and increased consumption of fresh fruit, vegetables, and low fat dairy, alcohol moderation, and smoking cessation.;Monitor prescription use compliance.    Expected Outcomes  Short Term: Continued assessment and intervention until BP is < 140/15mm HG in hypertensive participants. < 130/47mm HG in hypertensive participants with diabetes, heart failure or chronic kidney disease.;Long Term: Maintenance of blood pressure at goal levels.    Lipids  Yes    Intervention  Provide education and support for participant on nutrition & aerobic/resistive exercise along with prescribed medications to achieve LDL 70mg , HDL >40mg .    Expected Outcomes  Short Term: Participant states understanding of desired cholesterol values and is compliant with medications prescribed. Participant is following exercise prescription and nutrition guidelines.;Long Term: Cholesterol controlled with medications as prescribed, with individualized exercise RX and with personalized nutrition plan. Value  goals: LDL < 70mg , HDL > 40 mg.       Core Components/Risk Factors/Patient Goals Review:    Core Components/Risk Factors/Patient Goals at Discharge (Final Review):    ITP Comments: ITP Comments    Row Name 08/09/19 0835           ITP Comments  Dr. Fransico Him, Medical Director Cardiac Re[facility]          Comments: Patient attended orientation on 08/09/2019 to review rules and guidelines for program.  Completed 6 minute walk test, Intitial ITP, and exercise prescription.  VSS. Telemetry-NSR.  Asymptomatic. Safety measures and social distancing in place per CDC guidelines.

## 2019-08-10 ENCOUNTER — Ambulatory Visit (HOSPITAL_COMMUNITY): Payer: Medicare Other | Attending: Cardiology

## 2019-08-10 ENCOUNTER — Ambulatory Visit (INDEPENDENT_AMBULATORY_CARE_PROVIDER_SITE_OTHER): Payer: Medicare Other

## 2019-08-10 DIAGNOSIS — E785 Hyperlipidemia, unspecified: Secondary | ICD-10-CM | POA: Diagnosis not present

## 2019-08-10 DIAGNOSIS — I340 Nonrheumatic mitral (valve) insufficiency: Secondary | ICD-10-CM | POA: Diagnosis not present

## 2019-08-10 DIAGNOSIS — Z9889 Other specified postprocedural states: Secondary | ICD-10-CM | POA: Insufficient documentation

## 2019-08-10 DIAGNOSIS — Z5181 Encounter for therapeutic drug level monitoring: Secondary | ICD-10-CM | POA: Diagnosis not present

## 2019-08-10 DIAGNOSIS — Z950 Presence of cardiac pacemaker: Secondary | ICD-10-CM | POA: Insufficient documentation

## 2019-08-10 DIAGNOSIS — I480 Paroxysmal atrial fibrillation: Secondary | ICD-10-CM | POA: Diagnosis not present

## 2019-08-10 LAB — POCT INR: INR: 1.7 — AB (ref 2.0–3.0)

## 2019-08-10 NOTE — Patient Instructions (Signed)
Description   Take 1.5 tablets today, then start taking 1 tablet daily except for 1.5 tablets on Sundays and Thursdays. Discontinued Amiodarone 07/20/19. Recheck INR in 2 weeks. [facility] [phone]

## 2019-08-11 ENCOUNTER — Telehealth: Payer: Self-pay | Admitting: *Deleted

## 2019-08-11 NOTE — Telephone Encounter (Signed)
Patient is scheduled for lab study on 08/23/19. pt is scheduled for COVID screening on 08/20/19 prior to ss..  Patient understands his sleep study will be done at [facility] sleep lab. Patient understands he will receive a sleep packet in a week or so. Patient understands to call if he does not receive the sleep packet in a timely manner. Patient agrees with treatment and thanked me for call.

## 2019-08-11 NOTE — Telephone Encounter (Signed)
-----   Message from Lauralee Evener, [place] sent at 08/08/2019  6:07 PM EDT ----- Regarding: RE: DR. [name] ORDERED FOR THIS PT TO HAVE A SLEEP STUDY DONE Ok to schedule sleep study. No PA is required. ----- Message ----- From: Nuala Alpha, LPN Sent: [id number]  10:06 AM EDT To: Lauralee Evener, CMA, Freada Bergeron, CMA, # Subject: DR. Irven Shelling FOR THIS PT TO HAVE A SLE#  Dr. [name] saw this pt in clinic today and ordered for him to have a sleep study done for snoring. Order is in and pt is aware you will call to arrange. EPWORTH score is as mentioned below:  please call and arrange then shoot me the date you scheduled thereafter.  Thanks, Ivy   EPWORTH SCORE:  Scale:  0= Would never dose  1= Slight chance of dozing  2= Moderate chance of dozing  3= High chance of dozing   What is your chance of dozing while:  1) Sitting and reading? ___1_  2) Watching TV? __0__  3) Sitting, inactive in a public place (e.g.: a theater or meeting)? _0___  4) As a passenger in a car for an hour without a break? __0__  5) Lying down to rest in the afternoon when circumstances permit? 1____  6) Sitting and talking to someone? _0___  7) Sitting quietly after lunch without alcohol? __0__  8) In a car, while stopped for a few minutes in traffic? _0___   Total: __2____

## 2019-08-15 ENCOUNTER — Encounter (HOSPITAL_COMMUNITY)
Admission: RE | Admit: 2019-08-15 | Discharge: 2019-08-15 | Disposition: A | Discharge status: Home / Self Care | Payer: Medicare Other | Source: Ambulatory Visit | Attending: Cardiology | Admitting: Cardiology

## 2019-08-15 ENCOUNTER — Other Ambulatory Visit: Payer: Self-pay

## 2019-08-15 DIAGNOSIS — Z950 Presence of cardiac pacemaker: Secondary | ICD-10-CM | POA: Diagnosis not present

## 2019-08-15 DIAGNOSIS — R55 Syncope and collapse: Secondary | ICD-10-CM | POA: Diagnosis not present

## 2019-08-15 DIAGNOSIS — Z9889 Other specified postprocedural states: Secondary | ICD-10-CM

## 2019-08-15 DIAGNOSIS — Z954 Presence of other heart-valve replacement: Secondary | ICD-10-CM | POA: Diagnosis present

## 2019-08-15 DIAGNOSIS — I10 Essential (primary) hypertension: Secondary | ICD-10-CM | POA: Diagnosis not present

## 2019-08-15 DIAGNOSIS — E785 Hyperlipidemia, unspecified: Secondary | ICD-10-CM | POA: Diagnosis not present

## 2019-08-15 DIAGNOSIS — Z7901 Long term (current) use of anticoagulants: Secondary | ICD-10-CM | POA: Diagnosis not present

## 2019-08-15 DIAGNOSIS — I341 Nonrheumatic mitral (valve) prolapse: Secondary | ICD-10-CM | POA: Diagnosis not present

## 2019-08-15 DIAGNOSIS — I340 Nonrheumatic mitral (valve) insufficiency: Secondary | ICD-10-CM | POA: Diagnosis not present

## 2019-08-15 DIAGNOSIS — Z79899 Other long term (current) drug therapy: Secondary | ICD-10-CM | POA: Diagnosis not present

## 2019-08-15 NOTE — Progress Notes (Signed)
Daily Session Note  Patient Details  Name: Alexander [name] MRN: [id number] Date of Birth: 02/26/1941 Referring Provider:     [place] from 08/09/2019 in [place]  Referring Provider  [name], MD      Encounter Date: 08/15/2019  Check In: Session Check In - 08/15/19 1510      Check-In   Supervising physician immediately available to respond to emergencies  [facility] immediately available    Physician(s)  Dr. Algis Liming    Location  [facility]    Staff Present  Seward Carol, MS, ACSM CEP, Exercise Physiologist;[facility] MS, EP-C, CCRP;Tyara Nevels, MS,ACSM CEP, Exercise Physiologist;Chistopher Mangino, RN, BSN    Virtual Visit  No    Medication changes reported      No    Fall or balance concerns reported     No    Tobacco Cessation  No Change    Current number of cigarettes/nicotine per day      0    Warm-up and Cool-down  Performed on first and last piece of equipment    Resistance Training Performed  Yes    VAD Patient?  No    PAD/SET Patient?  No      Pain Assessment   Currently in Pain?  No/denies    Multiple Pain Sites  No       Capillary Blood Glucose: No results found for this or any previous visit (from the past 24 hour(s)).  Exercise Prescription Changes - 08/15/19 1600      Response to Exercise   Blood Pressure (Admit)  100/60    Blood Pressure (Exercise)  142/80    Blood Pressure (Exit)  118/70    Heart Rate (Admit)  93 bpm    Heart Rate (Exercise)  126 bpm    Heart Rate (Exit)  93 bpm    Rating of Perceived Exertion (Exercise)  11    Symptoms  None    Comments  Pt's first day of exercise, tolerated well    Duration  Progress to 10 minutes continuous walking  at current work load and total walking time to 30-45 min    Intensity  THRR unchanged      Progression   Progression  Continue to progress workloads to maintain intensity without signs/symptoms of  physical distress.    Average METs  3.1      Resistance Training   Training Prescription  Yes    Weight  4    Reps  10-15      NuStep   Level  3    SPM  60    Minutes  15    METs  3.3      Track   Laps  17    Minutes  15    METs  2.97       Social History   Tobacco Use  Smoking Status Never Smoker  Smokeless Tobacco Never Used    Goals Met:  Exercise tolerated well No report of cardiac concerns or symptoms Strength training completed today  Goals Unmet:  Not Applicable  Comments: Pt started cardiac rehab today.  Pt tolerated light exercise without difficulty. VSS, telemetry-Sinus rhythm, bundle branch block. asymptomatic.  Medication list reconciled. Pt denies barriers to medicaiton compliance.  PSYCHOSOCIAL ASSESSMENT:  PHQ-0. Pt exhibits positive coping skills, hopeful outlook with supportive family. No psychosocial needs identified at this time, no psychosocial interventions necessary.    Pt  enjoys playing golf.   Pt oriented to exercise equipment and routine.    Understanding verbalized.Barnet Pall, RN,BSN 08/15/2019 4:56 PM   Dr. Fransico Him is Medical Director for Cardiac Rehab at [facility].

## 2019-08-17 ENCOUNTER — Other Ambulatory Visit: Payer: Self-pay

## 2019-08-17 ENCOUNTER — Encounter (HOSPITAL_COMMUNITY)
Admission: RE | Admit: 2019-08-17 | Discharge: 2019-08-17 | Disposition: A | Discharge status: Home / Self Care | Payer: Medicare Other | Source: Ambulatory Visit | Attending: Cardiology | Admitting: Cardiology

## 2019-08-17 DIAGNOSIS — Z9889 Other specified postprocedural states: Secondary | ICD-10-CM

## 2019-08-17 DIAGNOSIS — I341 Nonrheumatic mitral (valve) prolapse: Secondary | ICD-10-CM | POA: Diagnosis not present

## 2019-08-19 ENCOUNTER — Other Ambulatory Visit: Payer: Self-pay

## 2019-08-19 ENCOUNTER — Encounter (HOSPITAL_COMMUNITY)
Admission: RE | Admit: 2019-08-19 | Discharge: 2019-08-19 | Disposition: A | Discharge status: Home / Self Care | Payer: Medicare Other | Source: Ambulatory Visit | Attending: Cardiology | Admitting: Cardiology

## 2019-08-19 ENCOUNTER — Ambulatory Visit (INDEPENDENT_AMBULATORY_CARE_PROVIDER_SITE_OTHER): Payer: Medicare Other | Admitting: *Deleted

## 2019-08-19 DIAGNOSIS — I341 Nonrheumatic mitral (valve) prolapse: Secondary | ICD-10-CM | POA: Diagnosis not present

## 2019-08-19 DIAGNOSIS — Z9889 Other specified postprocedural states: Secondary | ICD-10-CM

## 2019-08-19 DIAGNOSIS — I340 Nonrheumatic mitral (valve) insufficiency: Secondary | ICD-10-CM

## 2019-08-19 DIAGNOSIS — Z5181 Encounter for therapeutic drug level monitoring: Secondary | ICD-10-CM

## 2019-08-19 LAB — POCT INR: INR: 1.6 — AB (ref 2.0–3.0)

## 2019-08-19 NOTE — Patient Instructions (Signed)
Description   Take 1.5 tablets today, then start taking 1 tablet daily except for 1.5 tablets on Sundays, Tuesdays, and Thursdays. Discontinued Amiodarone 07/20/19. Recheck INR in 2 weeks. [facility] [phone]

## 2019-08-20 ENCOUNTER — Other Ambulatory Visit (HOSPITAL_COMMUNITY): Payer: Medicare Other

## 2019-08-22 ENCOUNTER — Encounter (HOSPITAL_COMMUNITY): Payer: Medicare Other

## 2019-08-23 ENCOUNTER — Encounter (HOSPITAL_BASED_OUTPATIENT_CLINIC_OR_DEPARTMENT_OTHER): Payer: Medicare Other | Admitting: Cardiology

## 2019-08-24 ENCOUNTER — Encounter (HOSPITAL_COMMUNITY): Admission: RE | Admit: 2019-08-24 | Payer: Medicare Other | Source: Ambulatory Visit

## 2019-08-24 ENCOUNTER — Other Ambulatory Visit: Payer: Self-pay

## 2019-08-26 ENCOUNTER — Encounter (HOSPITAL_COMMUNITY): Payer: Medicare Other

## 2019-08-29 ENCOUNTER — Other Ambulatory Visit: Payer: Self-pay

## 2019-08-29 ENCOUNTER — Encounter (HOSPITAL_COMMUNITY)
Admission: RE | Admit: 2019-08-29 | Discharge: 2019-08-29 | Disposition: A | Discharge status: Home / Self Care | Payer: Medicare Other | Source: Ambulatory Visit | Attending: Cardiology | Admitting: Cardiology

## 2019-08-29 DIAGNOSIS — Z9889 Other specified postprocedural states: Secondary | ICD-10-CM

## 2019-08-29 DIAGNOSIS — I341 Nonrheumatic mitral (valve) prolapse: Secondary | ICD-10-CM | POA: Diagnosis not present

## 2019-08-29 NOTE — Progress Notes (Signed)
Reviewed home exercise prescription with patient. Pt voices he is currently walking at home 3 days per week for 40-45 minutes. Importance of walking at home discussed. components of safe exercise program reviewed. Encouraged to always take prescribed medications before engaging in exercise. Stressed to patient to carry cell phone and ID if walking outdoors. Appropriate clothing and shoe choices reviewed. Weather parameters for temperatue reviewed and hydration encouraged before, during and after exercise. THRR of 56-112 discussed. Warm-up and cool-down encouraged, was well as  Walking for 3-4x/week for 30-45 minutes. Reviewed S/S that would require patient to terminate exercise and when to call 911 vs MD. Pt verbalized understanding of exercise prescription and was provided copy.  Lesly Rubenstein MS, ACSM-EP-C, CCRP

## 2019-08-30 ENCOUNTER — Ambulatory Visit (HOSPITAL_COMMUNITY)
Admission: RE | Admit: 2019-08-30 | Discharge: 2019-08-30 | Disposition: A | Discharge status: Home / Self Care | Payer: Medicare Other | Source: Ambulatory Visit | Attending: Urology | Admitting: Urology

## 2019-08-30 DIAGNOSIS — R972 Elevated prostate specific antigen [PSA]: Secondary | ICD-10-CM | POA: Insufficient documentation

## 2019-08-30 MED ORDER — GADOBUTROL 1 MMOL/ML IV SOLN
8.0000 mL | Freq: Once | INTRAVENOUS | Status: AC | PRN
  Administered 2019-08-30: 8 mL via INTRAVENOUS

## 2019-08-30 NOTE — Progress Notes (Signed)
Per order, changed device settings for MRI to AOO at 90 bpm   Tachy-therapies to off if applicable   Will program device back to pre-MRI settings after completion of exam.

## 2019-08-30 NOTE — Progress Notes (Signed)
Informed of MRI for today.   Device system confirmed to be MRI conditional, with implant date > 6 weeks ago and no evidence of abandoned or epicardial leads in review of most recent CXR Interrogation from today reviewed, pt is currently AS-VS at 77 bpm Change device settings for MRI to AOO at 90 bpm  Tachy-therapies to off if applicable   Program device back to pre-MRI settings after completion of exam.  Annamaria Helling  08/30/2019 12:39 PM

## 2019-08-31 ENCOUNTER — Other Ambulatory Visit: Payer: Self-pay

## 2019-08-31 ENCOUNTER — Ambulatory Visit (INDEPENDENT_AMBULATORY_CARE_PROVIDER_SITE_OTHER): Payer: Medicare Other

## 2019-08-31 ENCOUNTER — Encounter (HOSPITAL_COMMUNITY): Payer: Medicare Other

## 2019-08-31 DIAGNOSIS — I340 Nonrheumatic mitral (valve) insufficiency: Secondary | ICD-10-CM

## 2019-08-31 DIAGNOSIS — Z5181 Encounter for therapeutic drug level monitoring: Secondary | ICD-10-CM

## 2019-08-31 DIAGNOSIS — Z9889 Other specified postprocedural states: Secondary | ICD-10-CM

## 2019-08-31 LAB — POCT INR: INR: 1.7 — AB (ref 2.0–3.0)

## 2019-08-31 NOTE — Patient Instructions (Signed)
Description   Start taking 1.5 tablets daily except for 1 tablet on Mondays and Fridays.  Discontinued Amiodarone 07/20/19. Recheck INR in 2 weeks. [facility] [phone]

## 2019-09-01 NOTE — Progress Notes (Signed)
Cardiac Individual Treatment Plan  Patient Details  Name: Alexander [name] MRN: [id number] Date of Birth: November 29, 1940 Referring Provider:     CARDIAC REHAB PHASE II ORIENTATION from 08/09/2019 in [place]  Referring Provider [name], MD      Initial Encounter Date:    CARDIAC REHAB PHASE II ORIENTATION from 08/09/2019 in [facility]  Date 08/09/19      Visit Diagnosis: S/P mitral valve repair  Patient's Home Medications on Admission:  Current Outpatient Medications:  .  Ascorbic Acid (VITAMIN C) 1000 MG tablet, Take 1,000 mg by mouth daily., Disp: , Rfl:  .  colchicine 0.6 MG tablet, Take 1 tablet (0.6 mg total) by mouth 2 (two) times daily. Take for 3 weeks only., Disp: 42 tablet, Rfl: 0 .  ezetimibe (ZETIA) 10 MG tablet, Take 10 mg by mouth daily., Disp: , Rfl:  .  Multiple Vitamin (MULTIVITAMIN WITH MINERALS) TABS tablet, Take 1 tablet by mouth daily., Disp: , Rfl:  .  warfarin (COUMADIN) 3 MG tablet, Take 1 tablet (3 mg total) by mouth daily at 4 PM., Disp: 30 tablet, Rfl: 2  Past Medical History: Past Medical History:  Diagnosis Date  . Heart murmur   . Hyperlipidemia   . Hypertension   . Mitral regurgitation   . MVP (mitral valve prolapse)   . Presence of permanent cardiac pacemaker   . Syncope     Tobacco Use: Social History   Tobacco Use  Smoking Status Never Smoker  Smokeless Tobacco Never Used    Labs: Recent Review Flowsheet Data    Labs for ITP Cardiac and Pulmonary Rehab Latest Ref Rng & Units 06/14/2019 06/14/2019 06/14/2019 06/14/2019 06/14/2019   Cholestrol 100 - 199 mg/dL - - - - -   LDLCALC 0 - 99 mg/dL - - - - -   HDL >39 mg/dL - - - - -   Trlycerides 0 - 149 mg/dL - - - - -   Hemoglobin A1c 4.8 - 5.6 % - - - - -   PHART 7.35 - 7.45 7.380 - 7.281(L) 7.304(L) -   PCO2ART 32 - 48 mmHg 40.9 - 54.9(H) 48.3(H) -   HCO3 20.0 - 28.0 mmol/L 24.2 - 26.1 24.2 -   TCO2 22 - 32 mmol/L  25 24 28 26 26    ACIDBASEDEF 0.0 - 2.0 mmol/L 1.0 - 2.0 3.0(H) -   O2SAT % 99.0 - 100.0 98.0 -      Capillary Blood Glucose: Lab Results  Component Value Date   GLUCAP 115 (H) 06/18/2019   GLUCAP 85 06/18/2019   GLUCAP 99 06/17/2019   GLUCAP 109 (H) 06/17/2019   GLUCAP 90 06/17/2019     Exercise Target Goals: Exercise Program Goal: Individual exercise prescription set using results from initial 6 min walk test and THRR while considering  patient's activity barriers and safety.   Exercise Prescription Goal: Starting with aerobic activity 30 plus minutes a day, 3 days per week for initial exercise prescription. Provide home exercise prescription and guidelines that participant acknowledges understanding prior to discharge.  Activity Barriers & Risk Stratification:  Activity Barriers & Cardiac Risk Stratification - 08/09/19 1120      Activity Barriers & Cardiac Risk Stratification   Activity Barriers Shortness of Breath    Cardiac Risk Stratification Moderate           6 Minute Walk:  6 Minute Walk    Row Name 08/09/19 [phone]  6 Minute Walk   Phase Initial     Distance 1890 feet     Walk Time 6 minutes     # of Rest Breaks 0     MPH 3.58     METS 4.12     RPE 11     Perceived Dyspnea  0     VO2 Peak 14.43     Symptoms No     Resting HR 81 bpm     Resting BP 114/70     Resting Oxygen Saturation  98 %     Exercise Oxygen Saturation  during 6 min walk 97 %     Max Ex. HR 121 bpm     Max Ex. BP 166/72     2 Minute Post BP 124/84            Oxygen Initial Assessment:   Oxygen Re-Evaluation:   Oxygen Discharge (Final Oxygen Re-Evaluation):   Initial Exercise Prescription:  Initial Exercise Prescription - 08/09/19 1100      Date of Initial Exercise RX and Referring Provider   Date 08/09/19    Referring Provider [name], MD    Expected Discharge Date 10/07/19      Treadmill   MPH 2.8    Grade 0    Minutes 15    METs 3.14       NuStep   Level 3    SPM 85    Minutes 15    METs 3      Prescription Details   Frequency (times per week) 3    Duration Progress to 30 minutes of continuous aerobic without signs/symptoms of physical distress      Intensity   THRR 40-80% of Max Heartrate 56-112    Ratings of Perceived Exertion 11-13    Perceived Dyspnea 0-4      Progression   Progression Continue to progress workloads to maintain intensity without signs/symptoms of physical distress.      Resistance Training   Training Prescription Yes    Weight 4lbs    Reps 10-15           Perform Capillary Blood Glucose checks as needed.  Exercise Prescription Changes:  Exercise Prescription Changes    Row Name 08/15/19 1600 08/29/19 1600           Response to Exercise   Blood Pressure (Admit) 100/60 124/76      Blood Pressure (Exercise) 142/80 144/80      Blood Pressure (Exit) 118/70 132/82      Heart Rate (Admit) 93 bpm 68 bpm      Heart Rate (Exercise) 126 bpm 105 bpm      Heart Rate (Exit) 93 bpm 70 bpm      Rating of Perceived Exertion (Exercise) 11 11      Symptoms None None      Comments Pt's first day of exercise, tolerated well Reviwed Mets      Duration Progress to 10 minutes continuous walking  at current work load and total walking time to 30-45 min Progress to 30 minutes of  aerobic without signs/symptoms of physical distress      Intensity THRR unchanged THRR unchanged        Progression   Progression Continue to progress workloads to maintain intensity without signs/symptoms of physical distress. Continue to progress workloads to maintain intensity without signs/symptoms of physical distress.      Average METs 3.1 3.65        Resistance  Training   Training Prescription Yes Yes      Weight 4 4      Reps 10-15 10-15        NuStep   Level 3 --      SPM 60 --      Minutes 15 --      METs 3.3 --        Track   Laps 17 19      Minutes 15 15      METs 2.97 3.21        Home Exercise Plan    Plans to continue exercise at -- Home (comment)      Frequency -- Add 3 additional days to program exercise sessions.      Initial Home Exercises Provided -- 08/29/19             Exercise Comments:  Exercise Comments    Row Name 08/29/19 1617           Exercise Comments Reviewed METs and home exercise Rx with patient. Pt was provided copy of prescription and he verbalized understanding of the RX.              Exercise Goals and Review:  Exercise Goals    Row Name 08/09/19 0920             Exercise Goals   Increase Physical Activity Yes       Intervention Provide advice, education, support and counseling about physical activity/exercise needs.;Develop an individualized exercise prescription for aerobic and resistive training based on initial evaluation findings, risk stratification, comorbidities and participant's personal goals.       Expected Outcomes Short Term: Attend rehab on a regular basis to increase amount of physical activity.;Long Term: Exercising regularly at least 3-5 days a week.;Long Term: Add in home exercise to make exercise part of routine and to increase amount of physical activity.       Increase Strength and Stamina Yes       Intervention Provide advice, education, support and counseling about physical activity/exercise needs.;Develop an individualized exercise prescription for aerobic and resistive training based on initial evaluation findings, risk stratification, comorbidities and participant's personal goals.       Expected Outcomes Short Term: Increase workloads from initial exercise prescription for resistance, speed, and METs.;Short Term: Perform resistance training exercises routinely during rehab and add in resistance training at home;Long Term: Improve cardiorespiratory fitness, muscular endurance and strength as measured by increased METs and functional capacity (6MWT)       Able to understand and use rate of perceived exertion (RPE) scale Yes        Intervention Provide education and explanation on how to use RPE scale       Expected Outcomes Short Term: Able to use RPE daily in rehab to express subjective intensity level;Long Term:  Able to use RPE to guide intensity level when exercising independently       Knowledge and understanding of Target Heart Rate Range (THRR) Yes       Intervention Provide education and explanation of THRR including how the numbers were predicted and where they are located for reference       Expected Outcomes Short Term: Able to state/look up THRR;Long Term: Able to use THRR to govern intensity when exercising independently;Short Term: Able to use daily as guideline for intensity in rehab       Able to check pulse independently Yes       Intervention Provide education  and demonstration on how to check pulse in carotid and radial arteries.;Review the importance of being able to check your own pulse for safety during independent exercise       Expected Outcomes Short Term: Able to explain why pulse checking is important during independent exercise;Long Term: Able to check pulse independently and accurately       Understanding of Exercise Prescription Yes       Intervention Provide education, explanation, and written materials on patient's individual exercise prescription       Expected Outcomes Short Term: Able to explain program exercise prescription;Long Term: Able to explain home exercise prescription to exercise independently              Exercise Goals Re-Evaluation :  Exercise Goals Re-Evaluation    Alexander [name] Name 08/15/19 1619 08/29/19 1619           Exercise Goal Re-Evaluation   Exercise Goals Review Increase Physical Activity;Increase Strength and Stamina;Able to understand and use rate of perceived exertion (RPE) scale;Knowledge and understanding of Target Heart Rate Range (THRR);Understanding of Exercise Prescription Increase Physical Activity;Increase Strength and Stamina;Able to understand and use  rate of perceived exertion (RPE) scale;Knowledge and understanding of Target Heart Rate Range (THRR);Able to check pulse independently;Understanding of Exercise Prescription      Comments Today is patient's first day of CR. He tolerated session well and voiced he "enjoyed it." Reviewed Exercise RX, THRR, and RPE scale. Reviwed MET's with pateint as well as home exercise Rx. See notes.      Expected Outcomes Will continue to monitor and progress Pt as tolerated. Pt will walk 3-4 days per week for 30-45 minutes and abide by the exercise prescription guidelines.              Discharge Exercise Prescription (Final Exercise Prescription Changes):  Exercise Prescription Changes - 08/29/19 1600      Response to Exercise   Blood Pressure (Admit) 124/76    Blood Pressure (Exercise) 144/80    Blood Pressure (Exit) 132/82    Heart Rate (Admit) 68 bpm    Heart Rate (Exercise) 105 bpm    Heart Rate (Exit) 70 bpm    Rating of Perceived Exertion (Exercise) 11    Symptoms None    Comments Reviwed Mets    Duration Progress to 30 minutes of  aerobic without signs/symptoms of physical distress    Intensity THRR unchanged      Progression   Progression Continue to progress workloads to maintain intensity without signs/symptoms of physical distress.    Average METs 3.65      Resistance Training   Training Prescription Yes    Weight 4    Reps 10-15      Track   Laps 19    Minutes 15    METs 3.21      Home Exercise Plan   Plans to continue exercise at Home (comment)    Frequency Add 3 additional days to program exercise sessions.    Initial Home Exercises Provided 08/29/19           Nutrition:  Target Goals: Understanding of nutrition guidelines, daily intake of sodium <1537m, cholesterol <2038m calories 30% from fat and 7% or less from saturated fats, daily to have 5 or more servings of fruits and vegetables.  Biometrics:  Pre Biometrics - 08/09/19 0840      Pre Biometrics   Waist  Circumference 33.5 inches    Hip Circumference 41 inches    Waist to  Hip Ratio 0.82 %    Triceps Skinfold 19 mm    % Body Fat 24.6 %    Grip Strength 40 kg    Flexibility 17.75 in    Single Leg Stand 6.5 seconds            Nutrition Therapy Plan and Nutrition Goals:   Nutrition Assessments:  Nutrition Assessments - 09/01/19 0710      MEDFICTS Scores   Pre Score 36           Nutrition Goals Re-Evaluation:   Nutrition Goals Discharge (Final Nutrition Goals Re-Evaluation):   Psychosocial: Target Goals: Acknowledge presence or absence of significant depression and/or stress, maximize coping skills, provide positive support system. Participant is able to verbalize types and ability to use techniques and skills needed for reducing stress and depression.  Initial Review & Psychosocial Screening:  Initial Psych Review & Screening - 08/09/19 1042      Initial Review   Current issues with None Identified      Family Dynamics   Good Support System? Yes    Comments Mr. Alexander [name] presents to his cardiac rehab orientation appointment with a positive attitude and outlook. Denies psychosocial needs and admits to a strong support system of family and friends. No interventions needed at this time.      Barriers   Psychosocial barriers to participate in program There are no identifiable barriers or psychosocial needs.      Screening Interventions   Interventions Encouraged to exercise           Quality of Life Scores:  Quality of Life - 08/09/19 1123      Quality of Life   Select Quality of Life      Quality of Life Scores   Health/Function Pre 28.5 %    Socioeconomic Pre 25.42 %    Psych/Spiritual Pre 27.86 %    Family Pre 28.8 %    GLOBAL Pre 27.81 %          Scores of 19 and below usually indicate a poorer quality of life in these areas.  A difference of  2-3 points is a clinically meaningful difference.  A difference of 2-3 points in the total score of the  Quality of Life Index has been associated with significant improvement in overall quality of life, self-image, physical symptoms, and general health in studies assessing change in quality of life.  PHQ-9: Recent Review Flowsheet Data    Depression screen [facility] 2/9 08/09/2019   Decreased Interest 0   Down, Depressed, Hopeless 0   PHQ - 2 Score 0     Interpretation of Total Score  Total Score Depression Severity:  1-4 = Minimal depression, 5-9 = Mild depression, 10-14 = Moderate depression, 15-19 = Moderately severe depression, 20-27 = Severe depression   Psychosocial Evaluation and Intervention:   Psychosocial Re-Evaluation:  Psychosocial Re-Evaluation    Alexander [name] Name 09/01/19 1549             Psychosocial Re-Evaluation   Current issues with None Identified       Comments Alexander [name] has not voiced any stressors or concerns       Interventions Encouraged to attend Cardiac Rehabilitation for the exercise       Continue Psychosocial Services  No Follow up required              Psychosocial Discharge (Final Psychosocial Re-Evaluation):  Psychosocial Re-Evaluation - 09/01/19 1549      Psychosocial Re-Evaluation   Current  issues with None Identified    Comments Alexander [name] has not voiced any stressors or concerns    Interventions Encouraged to attend Cardiac Rehabilitation for the exercise    Continue Psychosocial Services  No Follow up required           Vocational Rehabilitation: Provide vocational rehab assistance to qualifying candidates.   Vocational Rehab Evaluation & Intervention:   Education: Education Goals: Education classes will be provided on a weekly basis, covering required topics. Participant will state understanding/return demonstration of topics presented.  Learning Barriers/Preferences:   Education Topics: Hypertension, Hypertension Reduction -Define heart disease and high blood pressure. Discus how high blood pressure affects the body and ways to  reduce high blood pressure.   Exercise and Your Heart -Discuss why it is important to exercise, the FITT principles of exercise, normal and abnormal responses to exercise, and how to exercise safely.   Angina -Discuss definition of angina, causes of angina, treatment of angina, and how to decrease risk of having angina.   Cardiac Medications -Review what the following cardiac medications are used for, how they affect the body, and side effects that may occur when taking the medications.  Medications include Aspirin, Beta blockers, calcium channel blockers, ACE Inhibitors, angiotensin receptor blockers, diuretics, digoxin, and antihyperlipidemics.   Congestive Heart Failure -Discuss the definition of CHF, how to live with CHF, the signs and symptoms of CHF, and how keep track of weight and sodium intake.   Heart Disease and Intimacy -Discus the effect sexual activity has on the heart, how changes occur during intimacy as we age, and safety during sexual activity.   Smoking Cessation / COPD -Discuss different methods to quit smoking, the health benefits of quitting smoking, and the definition of COPD.   Nutrition I: Fats -Discuss the types of cholesterol, what cholesterol does to the heart, and how cholesterol levels can be controlled.   Nutrition II: Labels -Discuss the different components of food labels and how to read food label   Heart Parts/Heart Disease and PAD -Discuss the anatomy of the heart, the pathway of blood circulation through the heart, and these are affected by heart disease.   Stress I: Signs and Symptoms -Discuss the causes of stress, how stress may lead to anxiety and depression, and ways to limit stress.   Stress II: Relaxation -Discuss different types of relaxation techniques to limit stress.   Warning Signs of Stroke / TIA -Discuss definition of a stroke, what the signs and symptoms are of a stroke, and how to identify when someone is having  stroke.   Knowledge Questionnaire Score:  Knowledge Questionnaire Score - 08/09/19 1124      Knowledge Questionnaire Score   Pre Score 21/24           Core Components/Risk Factors/Patient Goals at Admission:  Personal Goals and Risk Factors at Admission - 08/09/19 1125      Core Components/Risk Factors/Patient Goals on Admission   Improve shortness of breath with ADL's Yes    Intervention Provide education, individualized exercise plan and daily activity instruction to help decrease symptoms of SOB with activities of daily living.    Expected Outcomes Short Term: Improve cardiorespiratory fitness to achieve a reduction of symptoms when performing ADLs;Long Term: Be able to perform more ADLs without symptoms or delay the onset of symptoms    Hypertension Yes    Intervention Provide education on lifestyle modifcations including regular physical activity/exercise, weight management, moderate sodium restriction and increased consumption of fresh fruit,  vegetables, and low fat dairy, alcohol moderation, and smoking cessation.;Monitor prescription use compliance.    Expected Outcomes Short Term: Continued assessment and intervention until BP is < 140/70m HG in hypertensive participants. < 130/832mHG in hypertensive participants with diabetes, heart failure or chronic kidney disease.;Long Term: Maintenance of blood pressure at goal levels.    Lipids Yes    Intervention Provide education and support for participant on nutrition & aerobic/resistive exercise along with prescribed medications to achieve LDL <702mHDL >24m10m  Expected Outcomes Short Term: Participant states understanding of desired cholesterol values and is compliant with medications prescribed. Participant is following exercise prescription and nutrition guidelines.;Long Term: Cholesterol controlled with medications as prescribed, with individualized exercise RX and with personalized nutrition plan. Value goals: LDL < 70mg32mL >  40 mg.           Core Components/Risk Factors/Patient Goals Review:   Goals and Risk Factor Review    Row Name 09/01/19 1550             Core Components/Risk Factors/Patient Goals Review   Personal Goals Review Improve shortness of breath with ADL's;Hypertension;Lipids       Review Alexander MorteAlphonzo Cruisedone well with exercise when in attendance for cardiac rehab. Alexander MorteJosefa Halfl sgns have been stable       Expected Outcomes Alexander [name] continue to partcipate in phase 2 cardiac rehab for exercise,nutrtition and lifestyle modifications              Core Components/Risk Factors/Patient Goals at Discharge (Final Review):   Goals and Risk Factor Review - 09/01/19 1550      Core Components/Risk Factors/Patient Goals Review   Personal Goals Review Improve shortness of breath with ADL's;Hypertension;Lipids    Review Alexander MorteAlphonzo Cruisedone well with exercise when in attendance for cardiac rehab. Alexander MorteJosefa Halfl sgns have been stable    Expected Outcomes Alexander [name] continue to partcipate in phase 2 cardiac rehab for exercise,nutrtition and lifestyle modifications           ITP Comments:  ITP Comments    Row Name 08/09/19 0835 09/01/19 1547         ITP Comments Alexander. TraciFransico Himical Director Cardiac Rehab [place] 30 Day ITP Review. Alexander MorteAlphonzo Cruiseout last week due to being out of town. Alexander MorteAlphonzo Cruiseff to a good start to exercise.             Comments: See ITP comments.MariaBarnet PallBSN 09/01/2019 3:55 PM

## 2019-09-02 ENCOUNTER — Encounter (HOSPITAL_COMMUNITY): Payer: Medicare Other

## 2019-09-05 ENCOUNTER — Other Ambulatory Visit: Payer: Self-pay | Admitting: Physician Assistant

## 2019-09-05 ENCOUNTER — Telehealth: Payer: Self-pay | Admitting: Cardiology

## 2019-09-05 ENCOUNTER — Other Ambulatory Visit: Payer: Self-pay | Admitting: Cardiology

## 2019-09-05 ENCOUNTER — Other Ambulatory Visit (HOSPITAL_COMMUNITY)
Admission: RE | Admit: 2019-09-05 | Discharge: 2019-09-05 | Disposition: A | Discharge status: Home / Self Care | Payer: Medicare Other | Source: Ambulatory Visit | Attending: Cardiology | Admitting: Cardiology

## 2019-09-05 ENCOUNTER — Encounter (HOSPITAL_COMMUNITY): Payer: Medicare Other

## 2019-09-05 DIAGNOSIS — Z01812 Encounter for preprocedural laboratory examination: Secondary | ICD-10-CM | POA: Insufficient documentation

## 2019-09-05 DIAGNOSIS — Z20822 Contact with and (suspected) exposure to covid-19: Secondary | ICD-10-CM | POA: Diagnosis not present

## 2019-09-05 LAB — SARS CORONAVIRUS 2 (TAT 6-24 HRS): SARS Coronavirus 2: NEGATIVE

## 2019-09-05 MED ORDER — WARFARIN SODIUM 3 MG PO TABS: ORAL_TABLET | ORAL | 2 refills | Status: DC

## 2019-09-05 NOTE — Telephone Encounter (Signed)
Pt c/o medication issue:  1. Name of Medication: warfarin (COUMADIN) 3 MG tablet  2. How are you currently taking this medication (dosage and times per day)?   3. Are you having a reaction (difficulty breathing--STAT)? No  4. What is your medication issue? Pharmacy called for clarification on this patient's medication

## 2019-09-05 NOTE — Telephone Encounter (Signed)
*  STAT* If patient is at the pharmacy, call can be transferred to refill team.   1. Which medications need to be refilled? (please list name of each medication and dose if known)  warfarin (COUMADIN) 3 MG tablet  2. Which pharmacy/location (including street and city if local pharmacy) is medication to be sent to? [facility] [place], [place] - [address]  3. Do they need a 30 day or 90 day supply? 30 day supply   Please call pharmacy if there is an issue filling prescription so they are aware and know why.

## 2019-09-05 NOTE — Telephone Encounter (Signed)
Spoke with pt.  Assisted with manual transmission.  No arrhythmias noted.  D/W pt different causes for pre-syncope.  Pt will continue to monitor, watching BP and hydration status.    Notify primary cardiologist Dr. [name] if issue continues.

## 2019-09-05 NOTE — Telephone Encounter (Signed)
Pt is concerned that his pacemaker is not calibrated correctly. He had an MRI done last week and his pacemaker had to be turned off for the test. It was turned back on after the test, but the pt has had 3 spells when he felt like he was going to pass out. He wanted to have Dr. Curt Bears look at his pacemaker and make sure it is calibrated properly. Please call the patient

## 2019-09-07 ENCOUNTER — Encounter (HOSPITAL_BASED_OUTPATIENT_CLINIC_OR_DEPARTMENT_OTHER): Payer: Self-pay | Admitting: Cardiology

## 2019-09-07 ENCOUNTER — Encounter (HOSPITAL_COMMUNITY): Payer: Medicare Other

## 2019-09-07 ENCOUNTER — Other Ambulatory Visit: Payer: Self-pay

## 2019-09-07 ENCOUNTER — Ambulatory Visit (HOSPITAL_BASED_OUTPATIENT_CLINIC_OR_DEPARTMENT_OTHER): Payer: Medicare Other | Attending: Cardiology | Admitting: Cardiology

## 2019-09-07 DIAGNOSIS — R0683 Snoring: Secondary | ICD-10-CM | POA: Insufficient documentation

## 2019-09-07 DIAGNOSIS — G4733 Obstructive sleep apnea (adult) (pediatric): Secondary | ICD-10-CM | POA: Insufficient documentation

## 2019-09-07 HISTORY — DX: Snoring: R06.83

## 2019-09-09 ENCOUNTER — Encounter (HOSPITAL_COMMUNITY): Payer: Medicare Other

## 2019-09-14 ENCOUNTER — Other Ambulatory Visit: Payer: Self-pay

## 2019-09-14 ENCOUNTER — Ambulatory Visit (INDEPENDENT_AMBULATORY_CARE_PROVIDER_SITE_OTHER): Payer: Medicare Other | Admitting: Pharmacist

## 2019-09-14 ENCOUNTER — Encounter (HOSPITAL_COMMUNITY)
Admission: RE | Admit: 2019-09-14 | Discharge: 2019-09-14 | Disposition: A | Discharge status: Home / Self Care | Payer: Medicare Other | Source: Ambulatory Visit | Attending: Cardiology | Admitting: Cardiology

## 2019-09-14 DIAGNOSIS — I340 Nonrheumatic mitral (valve) insufficiency: Secondary | ICD-10-CM | POA: Diagnosis not present

## 2019-09-14 DIAGNOSIS — E785 Hyperlipidemia, unspecified: Secondary | ICD-10-CM | POA: Diagnosis not present

## 2019-09-14 DIAGNOSIS — Z5181 Encounter for therapeutic drug level monitoring: Secondary | ICD-10-CM | POA: Diagnosis not present

## 2019-09-14 DIAGNOSIS — Z9889 Other specified postprocedural states: Secondary | ICD-10-CM

## 2019-09-14 DIAGNOSIS — Z7901 Long term (current) use of anticoagulants: Secondary | ICD-10-CM | POA: Insufficient documentation

## 2019-09-14 DIAGNOSIS — Z950 Presence of cardiac pacemaker: Secondary | ICD-10-CM | POA: Diagnosis not present

## 2019-09-14 DIAGNOSIS — I341 Nonrheumatic mitral (valve) prolapse: Secondary | ICD-10-CM | POA: Diagnosis not present

## 2019-09-14 DIAGNOSIS — I10 Essential (primary) hypertension: Secondary | ICD-10-CM | POA: Insufficient documentation

## 2019-09-14 DIAGNOSIS — Z79899 Other long term (current) drug therapy: Secondary | ICD-10-CM | POA: Insufficient documentation

## 2019-09-14 DIAGNOSIS — R55 Syncope and collapse: Secondary | ICD-10-CM | POA: Diagnosis not present

## 2019-09-14 DIAGNOSIS — Z954 Presence of other heart-valve replacement: Secondary | ICD-10-CM | POA: Diagnosis not present

## 2019-09-14 LAB — POCT INR: INR: 1.6 — AB (ref 2.0–3.0)

## 2019-09-14 NOTE — Patient Instructions (Signed)
Take 2 tablets today and tomorrow then start taking 1.5 tablets daily except for 1 tablet on Fridays.  Discontinued Amiodarone 07/20/19. Recheck INR in 2 weeks. [facility] [phone]

## 2019-09-16 ENCOUNTER — Telehealth (HOSPITAL_COMMUNITY): Payer: Self-pay | Admitting: Internal Medicine

## 2019-09-16 ENCOUNTER — Encounter (HOSPITAL_COMMUNITY): Payer: Medicare Other

## 2019-09-19 ENCOUNTER — Ambulatory Visit: Payer: Medicare Other | Admitting: Thoracic Surgery (Cardiothoracic Vascular Surgery)

## 2019-09-20 ENCOUNTER — Ambulatory Visit (INDEPENDENT_AMBULATORY_CARE_PROVIDER_SITE_OTHER): Payer: Medicare Other | Admitting: *Deleted

## 2019-09-20 DIAGNOSIS — I4510 Unspecified right bundle-branch block: Secondary | ICD-10-CM | POA: Diagnosis not present

## 2019-09-20 LAB — CUP PACEART REMOTE DEVICE CHECK
Battery Remaining Longevity: 147 mo
Battery Voltage: 3.02 V
Brady Statistic AP VP Percent: 0.01 %
Brady Statistic AP VS Percent: 9.1 %
Brady Statistic AS VP Percent: 0.02 %
Brady Statistic AS VS Percent: 90.86 %
Brady Statistic RA Percent Paced: 9.41 %
Brady Statistic RV Percent Paced: 0.03 %
Date Time Interrogation Session: 20210713000852
Implantable Lead Implant Date: 20190805
Implantable Lead Implant Date: 20190805
Implantable Lead Location: 753859
Implantable Lead Location: 753860
Implantable Lead Model: 5076
Implantable Lead Model: 5076
Implantable Pulse Generator Implant Date: 20190805
Lead Channel Impedance Value: 285 Ohm
Lead Channel Impedance Value: 285 Ohm
Lead Channel Impedance Value: 361 Ohm
Lead Channel Impedance Value: 418 Ohm
Lead Channel Pacing Threshold Amplitude: 0.75 V
Lead Channel Pacing Threshold Amplitude: 0.75 V
Lead Channel Pacing Threshold Pulse Width: 0.4 ms
Lead Channel Pacing Threshold Pulse Width: 0.4 ms
Lead Channel Sensing Intrinsic Amplitude: 3.875 mV
Lead Channel Sensing Intrinsic Amplitude: 3.875 mV
Lead Channel Sensing Intrinsic Amplitude: 4 mV
Lead Channel Sensing Intrinsic Amplitude: 4 mV
Lead Channel Setting Pacing Amplitude: 2 V
Lead Channel Setting Pacing Amplitude: 2.5 V
Lead Channel Setting Pacing Pulse Width: 0.4 ms
Lead Channel Setting Sensing Sensitivity: 1.2 mV

## 2019-09-21 ENCOUNTER — Encounter (HOSPITAL_COMMUNITY): Payer: Medicare Other

## 2019-09-21 NOTE — Progress Notes (Signed)
Remote pacemaker transmission.   

## 2019-09-23 ENCOUNTER — Other Ambulatory Visit: Payer: Self-pay

## 2019-09-23 ENCOUNTER — Encounter (HOSPITAL_COMMUNITY)
Admission: RE | Admit: 2019-09-23 | Discharge: 2019-09-23 | Disposition: A | Discharge status: Home / Self Care | Payer: Medicare Other | Source: Ambulatory Visit | Attending: Cardiology | Admitting: Cardiology

## 2019-09-23 DIAGNOSIS — Z950 Presence of cardiac pacemaker: Secondary | ICD-10-CM | POA: Diagnosis not present

## 2019-09-23 DIAGNOSIS — Z9889 Other specified postprocedural states: Secondary | ICD-10-CM

## 2019-09-23 DIAGNOSIS — I10 Essential (primary) hypertension: Secondary | ICD-10-CM | POA: Diagnosis not present

## 2019-09-23 DIAGNOSIS — E785 Hyperlipidemia, unspecified: Secondary | ICD-10-CM | POA: Diagnosis not present

## 2019-09-23 DIAGNOSIS — I340 Nonrheumatic mitral (valve) insufficiency: Secondary | ICD-10-CM | POA: Diagnosis not present

## 2019-09-23 DIAGNOSIS — Z954 Presence of other heart-valve replacement: Secondary | ICD-10-CM | POA: Diagnosis not present

## 2019-09-23 DIAGNOSIS — I341 Nonrheumatic mitral (valve) prolapse: Secondary | ICD-10-CM | POA: Diagnosis not present

## 2019-09-24 NOTE — Procedures (Signed)
    Patient Name: Alexander [name], Alexander [name] Date: 09/07/2019 Gender: Male D.O.B: November 11, 1940 Age (years): 47 Referring Provider: [name] Height (inches): 62 Interpreting Physician: Fransico Him MD, ABSM Weight (lbs): 167 RPSGT: Zadie Rhine BMI: 24 MRN: [id number] Neck Size: 15.00  CLINICAL INFORMATION Sleep Study Type: NPSG  Indication for sleep study: Snoring  Epworth Sleepiness Score: 5  SLEEP STUDY TECHNIQUE As per the AASM Manual for the Scoring of Sleep and Associated Events v2.3 (April 2016) with a hypopnea requiring 4% desaturations.  The channels recorded and monitored were frontal, central and occipital EEG, electrooculogram (EOG), submentalis EMG (chin), nasal and oral airflow, thoracic and abdominal wall motion, anterior tibialis EMG, snore microphone, electrocardiogram, and pulse oximetry.  MEDICATIONS Medications self-administered by patient taken the night of the study : [place] The study was initiated at 9:56:51 PM and ended at 4:35:14 AM.  Sleep onset time was 8.3 minutes and the sleep efficiency was 86.5%. The total sleep time was 344.5 minutes.  Stage REM latency was 203.0 minutes.  The patient spent 7.1% of the night in stage N1 sleep, 87.2% in stage N2 sleep, 0.0% in stage N3 and 5.7% in REM.  Alpha intrusion was absent.  Supine sleep was 57.60%.  RESPIRATORY PARAMETERS The overall apnea/hypopnea index (AHI) was 6.8 per hour. There were 8 total apneas, including 7 obstructive, 1 central and 0 mixed apneas. There were 31 hypopneas and 30 RERAs.  The AHI during Stage REM sleep was 15.4 per hour.  AHI while supine was 10.6 per hour.  The mean oxygen saturation was 92.3%. The minimum SpO2 during sleep was 84.0%.  moderate snoring was noted during this study.  CARDIAC DATA The 2 lead EKG demonstrated sinus rhythm. The mean heart rate was 64.6 beats per minute. Other EKG findings include: None.  LEG MOVEMENT DATA The  total PLMS were 0 with a resulting PLMS index of 0.0. Associated arousal with leg movement index was 7.8 .  IMPRESSIONS - Mild obstructive sleep apnea occurred during this study (AHI = 6.8/h). - No significant central sleep apnea occurred during this study (CAI = 0.2/h). - Mild oxygen desaturation was noted during this study (Min O2 = 84.0%). - The patient snored with moderate snoring volume. - No cardiac abnormalities were noted during this study. - Clinically significant periodic limb movements did not occur during sleep. Associated arousals were significant.  DIAGNOSIS - Obstructive Sleep Apnea (G47.33)  RECOMMENDATIONS - Recommend trial of auto CPAP from 4 to 18cm H2O with heated humidity and mask of choice.  - Positional therapy avoiding supine position during sleep. - Avoid alcohol, sedatives and other CNS depressants that may worsen sleep apnea and disrupt normal sleep architecture. - Sleep hygiene should be reviewed to assess factors that may improve sleep quality. - Weight management and regular exercise should be initiated or continued if appropriate.  [Electronically signed] 09/24/2019 05:00 PM  Fransico Him MD, ABSM Diplomate, American Board of Sleep Medicine

## 2019-09-26 ENCOUNTER — Encounter: Payer: Self-pay | Admitting: Thoracic Surgery (Cardiothoracic Vascular Surgery)

## 2019-09-26 ENCOUNTER — Ambulatory Visit (INDEPENDENT_AMBULATORY_CARE_PROVIDER_SITE_OTHER): Payer: Medicare Other | Admitting: Thoracic Surgery (Cardiothoracic Vascular Surgery)

## 2019-09-26 ENCOUNTER — Telehealth: Payer: Self-pay | Admitting: *Deleted

## 2019-09-26 ENCOUNTER — Encounter (HOSPITAL_COMMUNITY)
Admission: RE | Admit: 2019-09-26 | Discharge: 2019-09-26 | Disposition: A | Discharge status: Home / Self Care | Payer: Medicare Other | Source: Ambulatory Visit | Attending: Cardiology | Admitting: Cardiology

## 2019-09-26 ENCOUNTER — Telehealth: Payer: Self-pay | Admitting: Cardiology

## 2019-09-26 ENCOUNTER — Other Ambulatory Visit: Payer: Self-pay

## 2019-09-26 VITALS — BP 123/71 | HR 83 | Temp 97.7°F | Resp 20 | Ht 69.0 in | Wt 168.6 lb

## 2019-09-26 DIAGNOSIS — Z954 Presence of other heart-valve replacement: Secondary | ICD-10-CM | POA: Diagnosis not present

## 2019-09-26 DIAGNOSIS — I341 Nonrheumatic mitral (valve) prolapse: Secondary | ICD-10-CM | POA: Diagnosis not present

## 2019-09-26 DIAGNOSIS — Z950 Presence of cardiac pacemaker: Secondary | ICD-10-CM | POA: Diagnosis not present

## 2019-09-26 DIAGNOSIS — Z9889 Other specified postprocedural states: Secondary | ICD-10-CM | POA: Diagnosis not present

## 2019-09-26 DIAGNOSIS — E785 Hyperlipidemia, unspecified: Secondary | ICD-10-CM

## 2019-09-26 DIAGNOSIS — I10 Essential (primary) hypertension: Secondary | ICD-10-CM | POA: Diagnosis not present

## 2019-09-26 DIAGNOSIS — I340 Nonrheumatic mitral (valve) insufficiency: Secondary | ICD-10-CM | POA: Diagnosis not present

## 2019-09-26 MED ORDER — ASPIRIN EC 81 MG PO TBEC
81.0000 mg | DELAYED_RELEASE_TABLET | Freq: Every day | ORAL | 3 refills | Status: DC
Start: 2019-09-26 — End: 2023-02-16

## 2019-09-26 NOTE — Telephone Encounter (Signed)
Spoke with the pt and informed him that per Dr. [name], he can STOP taking Coumadin, but he must start taking ASA 81 mg po daily in place of this. Pt states he will stop taking his coumadin after today, and start his ASA 81 mg po daily regimen tomorrow.  Pt states he was due for coumadin clinic appt on this Wednesday 7/21.  Informed him that I will send them this message for their records, and to cancel his appt as scheduled with them on 7/21. Pt verbalized understanding and agrees with this plan. Coumadin discontinued from his regimen/med list, and ASA 81 mg po daily was started in its place.

## 2019-09-26 NOTE — Telephone Encounter (Addendum)
Informed patient of sleep study results and patient understanding was verbalized. Patient understands his sleep study showed they have sleep apnea and recommend auto CPAP titration through DME Orders have been placed in Epic. Please set 8 week OV with me.    Pt is aware and agreeable to his results but is NOT willing to commit to getting the cpap just yet. Patient states he will call back at later date to give [place] his decision.

## 2019-09-26 NOTE — Patient Instructions (Addendum)
Continue all previous medications without any changes at this time.  Check with Dr [name] to see if you may stop taking Coumadin and resume aspirin 81 mg/day  You may resume unrestricted physical activity without any particular limitations at this time.  Endocarditis is a potentially serious infection of heart valves or inside lining of the heart.  It occurs more commonly in patients with diseased heart valves (such as patient's with aortic or mitral valve disease) and in patients who have undergone heart valve repair or replacement.  Certain surgical and dental procedures may put you at risk, such as dental cleaning, other dental procedures, or any surgery involving the respiratory, urinary, gastrointestinal tract, gallbladder or prostate gland.   To minimize your chances for develooping endocarditis, maintain good oral health and seek prompt medical attention for any infections involving the mouth, teeth, gums, skin or urinary tract.    Always notify your doctor or dentist about your underlying heart valve condition before having any invasive procedures. You will need to take antibiotics before certain procedures, including all routine dental cleanings or other dental procedures.  Your cardiologist or dentist should prescribe these antibiotics for you to be taken ahead of time.

## 2019-09-26 NOTE — Telephone Encounter (Signed)
Pt is calling to let Dr. [name] know that he saw Dr. [name] today, and he said that being he has been 3 months s/p MVR, and everything looks good, he can safely come off his Coumadin, and start ASA regimen, if Dr.Nelson advises if this is ok. He states that's why he is calling in, is to ask Dr. [name] if it is ok for him to stop coumadin, and if so, should he start ASA thereafter, and at what dose? Informed him that I will route this concern to Dr. [name] to further review and advise on, and follow-up with the pt thereafter.  Pt verbalized understanding and agrees with this plan.  He states we can call or mychart our response.

## 2019-09-26 NOTE — Telephone Encounter (Signed)
Yes that is okay, just make sure he starts taking aspirin.

## 2019-09-26 NOTE — Telephone Encounter (Signed)
Patient is requesting to speak with Dr. Francesca Oman nurse to discuss holding coumadin 3 months post-op. Please call.

## 2019-09-26 NOTE — Telephone Encounter (Signed)
-----   Message from [name], MD sent at 09/24/2019  5:03 PM EDT ----- Please let patient know that they have sleep apnea and recommend auto CPAP titration through DME  Orders have been placed in Epic. Please set 8 week OV with me.

## 2019-09-26 NOTE — Progress Notes (Signed)
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  His early postoperative recovery in the hospital was notable for a brief episode of atrial fibrillation for which he was treated with amiodarone.  He was discharged from the hospital in sinus rhythm on the sixth postoperative day.  Since hospital discharge he has done very well.    He has maintained sinus rhythm and amiodarone was stopped.  Routine follow-up echocardiogram performed August 10, 2019 revealed intact mitral valve repair with trivial mitral regurgitation and no mitral stenosis with mean transvalvular gradient estimated 4 mmHg.  Left ventricular ejection fraction was estimated 50 to 55%.  Patient returns her office today and reports that he is doing exceptionally well.  He is back to completely normal physical activity without any limitations.  He no longer has any pain in his chest.  He has no shortness of breath.  He exercises on a regular basis and he states that his exercise tolerance is back to baseline.  He is delighted with his outcome.  He has not had any palpitations or other symptoms to suggest a recurrence of atrial fibrillation.   Current Outpatient Medications  Medication Sig Dispense Refill  . Ascorbic Acid (VITAMIN C) 1000 MG tablet Take 1,000 mg by mouth daily.    Marland Kitchen ezetimibe (ZETIA) 10 MG tablet Take 10 mg by mouth daily.    . Multiple Vitamin (MULTIVITAMIN WITH MINERALS) TABS tablet Take 1 tablet by mouth  daily.    Marland Kitchen warfarin (COUMADIN) 3 MG tablet Take 1.5 tablets daily except 1 tablet on Monday and Friday or as directed by Anticoagulation Clinic. 50 tablet 2   No current facility-administered medications for this visit.      Physical Exam:   BP 123/71 (BP Location: Right Arm, Patient Position: Sitting, Cuff Size: Normal)   Pulse 83   Temp 97.7 F (36.5 C)   Resp 20   Ht 5\' 9"  (1.753 m)   Wt 168 lb 9.6 oz (76.5 kg)   SpO2 97% Comment: RA  BMI 24.90 kg/m   General:  Well-appearing  Chest:   Clear to auscultation  CV:   Regular rate and rhythm without murmur  Incisions:  Completely healed  Abdomen:  Soft nontender  Extremities:  Warm and well-perfused  Diagnostic Tests:   ECHOCARDIOGRAM REPORT       Patient Name:  [name] Date of Exam: 08/10/2019  Medical Rec #: [id number]       Height:    69.5 in  Accession #:  [id number]       Weight:    169.3 lb  Date of Birth: Jul 10, 1940       BSA:     1.934 m  Patient Age:  23 years        BP:      128/62 mmHg  Patient Gender: M           HR:  77 bpm.  Exam Location: [facility]   Procedure: 2D Echo, Cardiac Doppler, Color Doppler and 3D Echo   Indications:  Z98.890 S/P Mitral Valve repair    History:    Patient has prior history of Echocardiogram examinations,  most         recent 03/14/2019. Pacemaker, Mitral Valve Prolapse,         Arrythmias:RBBB and Sinus node arrest.; Risk         Factors:Dyslipidemia and Hypertension.           Mitral Valve: 39 Sorin Memo 4D r prosthetic annuloplasty  ring         valve is present in the mitral position. Procedure Date:         06/14/2019.    Sonographer:  Marygrace Drought RCS  Referring Phys: [place]    1. Since the last study on 05/23/2019 the patient is post mitral valve  repair with valvuloplasty ring  for severe mitral regurgitation. There is  only trivial residual mitral regurgitation. LVEF is low normal 50-55%.  2. Left ventricular ejection fraction, by estimation, is 50 to 55%. The  left ventricle has low normal function. The left ventricle has no regional  wall motion abnormalities. There is mild concentric left ventricular  hypertrophy. Left ventricular  diastolic parameters are consistent with Grade I diastolic dysfunction  (impaired relaxation).  3. Right ventricular systolic function is normal. The right ventricular  size is normal. There is normal pulmonary artery systolic pressure.  4. The mitral valve has been repaired/replaced. Trivial mitral valve  regurgitation. No evidence of mitral stenosis. There is a 20 Sorin Memo 4D  r prosthetic annuloplasty ring present in the mitral position. Procedure  Date: 06/14/2019.  5. The aortic valve is normal in structure. Aortic valve regurgitation is  not visualized. No aortic stenosis is present.  6. The inferior vena cava is normal in size with greater than 50%  respiratory variability, suggesting right atrial pressure of 3 mmHg.   FINDINGS  Left Ventricle: Left ventricular ejection fraction, by estimation, is 50  to 55%. The left ventricle has low normal function. The left ventricle has  no regional wall motion abnormalities. The left ventricular internal  cavity size was normal in size.  There is mild concentric left ventricular hypertrophy. Left ventricular  diastolic parameters are consistent with Grade I diastolic dysfunction  (impaired relaxation). Normal left ventricular filling pressure.   Right Ventricle: The right ventricular size is normal. No increase in  right ventricular wall thickness. Right ventricular systolic function is  normal. There is normal pulmonary artery systolic pressure.   Left Atrium: Left atrial size was normal in size.   Right Atrium: Right atrial size was normal in size.   Pericardium: There  is no evidence of pericardial effusion.   Mitral Valve: The mitral valve has been repaired/replaced. Normal mobility  of the mitral valve leaflets. Trivial mitral valve regurgitation. There is  a 15 Sorin Memo 4D r prosthetic annuloplasty ring present in the mitral  position. Procedure Date:  06/14/2019. No evidence of mitral valve stenosis. MV peak gradient, 4.3  mmHg. The mean mitral valve gradient is 2.0 mmHg.   Tricuspid Valve: The tricuspid valve is normal in structure. Tricuspid  valve regurgitation is mild . No evidence of tricuspid stenosis.   Aortic Valve: The aortic valve is normal in structure. Aortic valve  regurgitation is not visualized. No aortic stenosis is present.   Pulmonic Valve: The pulmonic  valve was normal in structure. Pulmonic valve  regurgitation is not visualized. No evidence of pulmonic stenosis.   Aorta: The aortic root is normal in size and structure.   Venous: The inferior vena cava is normal in size with greater than 50%  respiratory variability, suggesting right atrial pressure of 3 mmHg.   IAS/Shunts: No atrial level shunt detected by color flow Doppler.     LEFT VENTRICLE  PLAX 2D  LVIDd:     5.20 cm Diastology  LVIDs:     3.81 cm LV e' lateral:  7.29 cm/s  LV PW:     1.11 cm LV E/e' lateral: 10.0  LV IVS:    1.16 cm LV e' medial:  5.11 cm/s  LVOT diam:   2.00 cm LV E/e' medial: 14.3  LV SV:     53  LV SV Index:  27  LVOT Area:   3.14 cm               3D Volume EF:             3D EF:    67 %             LV EDV:    98 ml             LV ESV:    33 ml             LV SV:    65 ml   RIGHT VENTRICLE  RV Basal diam: 3.68 cm  RV S prime:   9.46 cm/s  TAPSE (M-mode): 1.8 cm   LEFT ATRIUM       Index    RIGHT ATRIUM      Index  LA diam:    3.50 cm 1.81 cm/m RA Area:   14.30 cm  LA Vol (A2C):  48.9 ml  25.28 ml/m RA Volume:  37.30 ml 19.28 ml/m  LA Vol (A4C):  57.4 ml 29.67 ml/m  LA Biplane Vol: 53.3 ml 27.55 ml/m  AORTIC VALVE  LVOT Vmax:  90.20 cm/s  LVOT Vmean: 62.300 cm/s  LVOT VTI:  0.168 m    AORTA  Ao Root diam: 3.70 cm   MITRAL VALVE  MV Area (PHT): 4.06 cm  SHUNTS  MV Peak grad: 4.3 mmHg  Systemic VTI: 0.17 m  MV Mean grad: 2.0 mmHg  Systemic Diam: 2.00 cm  MV Vmax:    1.04 m/s  MV Vmean:   63.2 cm/s  MV Decel Time: 187 msec  MV E velocity: 73.20 cm/s  MV A velocity: 95.60 cm/s  MV E/A ratio: 0.77   [name] MD  Electronically signed by [name] MD  Signature Date/Time: 08/12/2019/2:03:13 PM      Impression:  Patient is doing very well nearly 3 months status post minimally invasive mitral valve repair  Plan:  Patient may resume completely unrestricted physical activity.  We have not recommended any change the patient's current medications.  However, it may be reasonable for the patient to stop taking warfarin in the near future.   It might be wise to consider interrogation of his pacemaker to be sure that he does not have any subclinical episodes of atrial fibrillation.  At that time I would recommend starting aspirin 81 mg daily.  The patient has been reminded regarding the importance of dental hygiene and the lifelong need for antibiotic prophylaxis for all dental cleanings and other related invasive procedures.  Patient will return to our office next April, approximately  1 year following his surgery.  He will call return sooner should specific problems or questions arise.    Valentina Gu. [name], MD 09/26/2019 2:08 PM

## 2019-09-27 NOTE — Telephone Encounter (Signed)
Cancelled 09/28/19 appt in Epic and discontinued anticoagulation episode of care.

## 2019-09-28 ENCOUNTER — Encounter (HOSPITAL_COMMUNITY): Payer: Medicare Other

## 2019-09-29 NOTE — Progress Notes (Signed)
Cardiac Individual Treatment Plan  Patient Details  Name: Alexander [name] MRN: [id number] Date of Birth: January 02, 1941 Referring Provider:     CARDIAC REHAB PHASE II ORIENTATION from 08/09/2019 in [place]  Referring Provider Alexander Spark, MD      Initial Encounter Date:    CARDIAC REHAB PHASE II ORIENTATION from 08/09/2019 in [place]  Date 08/09/19      Visit Diagnosis: S/P mitral valve repair  Patient's Home Medications on Admission:  Current Outpatient Medications:    Ascorbic Acid (VITAMIN C) 1000 MG tablet, Take 1,000 mg by mouth daily., Disp: , Rfl:    aspirin EC 81 MG tablet, Take 1 tablet (81 mg total) by mouth daily. Swallow whole., Disp: 90 tablet, Rfl: 3   ezetimibe (ZETIA) 10 MG tablet, Take 10 mg by mouth daily., Disp: , Rfl:    Multiple Vitamin (MULTIVITAMIN WITH MINERALS) TABS tablet, Take 1 tablet by mouth daily., Disp: , Rfl:   Past Medical History: Past Medical History:  Diagnosis Date   Heart murmur    Hyperlipidemia    Hypertension    Mitral regurgitation    MVP (mitral valve prolapse)    Presence of permanent cardiac pacemaker    Snoring 09/07/2019   Syncope     Tobacco Use: Social History   Tobacco Use  Smoking Status Never Smoker  Smokeless Tobacco Never Used    Labs: Recent Review Flowsheet Data    Labs for ITP Cardiac and Pulmonary Rehab Latest Ref Rng & Units 06/14/2019 06/14/2019 06/14/2019 06/14/2019 06/14/2019   Cholestrol 100 - 199 mg/dL - - - - -   LDLCALC 0 - 99 mg/dL - - - - -   HDL >39 mg/dL - - - - -   Trlycerides 0 - 149 mg/dL - - - - -   Hemoglobin A1c 4.8 - 5.6 % - - - - -   PHART 7.35 - 7.45 7.380 - 7.281(L) 7.304(L) -   PCO2ART 32 - 48 mmHg 40.9 - 54.9(H) 48.3(H) -   HCO3 20.0 - 28.0 mmol/L 24.2 - 26.1 24.2 -   TCO2 22 - 32 mmol/L 25 24 28 26 26    ACIDBASEDEF 0.0 - 2.0 mmol/L 1.0 - 2.0 3.0(H) -   O2SAT % 99.0 - 100.0 98.0 -      Capillary  Blood Glucose: Lab Results  Component Value Date   GLUCAP 115 (H) 06/18/2019   GLUCAP 85 06/18/2019   GLUCAP 99 06/17/2019   GLUCAP 109 (H) 06/17/2019   GLUCAP 90 06/17/2019     Exercise Target Goals: Exercise Program Goal: Individual exercise prescription set using results from initial 6 min walk test and THRR while considering  patients activity barriers and safety.   Exercise Prescription Goal: Starting with aerobic activity 30 plus minutes a day, 3 days per week for initial exercise prescription. Provide home exercise prescription and guidelines that participant acknowledges understanding prior to discharge.  Activity Barriers & Risk Stratification:  Activity Barriers & Cardiac Risk Stratification - 08/09/19 1120      Activity Barriers & Cardiac Risk Stratification   Activity Barriers Shortness of Breath    Cardiac Risk Stratification Moderate           6 Minute Walk:  6 Minute Walk    Row Name 08/09/19 0931         6 Minute Walk   Phase Initial     Distance 1890 feet  Walk Time 6 minutes     # of Rest Breaks 0     MPH 3.58     METS 4.12     RPE 11     Perceived Dyspnea  0     VO2 Peak 14.43     Symptoms No     Resting HR 81 bpm     Resting BP 114/70     Resting Oxygen Saturation  98 %     Exercise Oxygen Saturation  during 6 min walk 97 %     Max Ex. HR 121 bpm     Max Ex. BP 166/72     2 Minute Post BP 124/84            Oxygen Initial Assessment:   Oxygen Re-Evaluation:   Oxygen Discharge (Final Oxygen Re-Evaluation):   Initial Exercise Prescription:  Initial Exercise Prescription - 08/09/19 1100      Date of Initial Exercise RX and Referring Provider   Date 08/09/19    Referring Provider Alexander Spark, MD    Expected Discharge Date 10/07/19      Treadmill   MPH 2.8    Grade 0    Minutes 15    METs 3.14      NuStep   Level 3    SPM 85    Minutes 15    METs 3      Prescription Details   Frequency (times per week) 3     Duration Progress to 30 minutes of continuous aerobic without signs/symptoms of physical distress      Intensity   THRR 40-80% of Max Heartrate 56-112    Ratings of Perceived Exertion 11-13    Perceived Dyspnea 0-4      Progression   Progression Continue to progress workloads to maintain intensity without signs/symptoms of physical distress.      Resistance Training   Training Prescription Yes    Weight 4lbs    Reps 10-15           Perform Capillary Blood Glucose checks as needed.  Exercise Prescription Changes:  Exercise Prescription Changes    Row Name 08/15/19 1600 08/29/19 1600 09/14/19 1615 09/23/19 1600       Response to Exercise   Blood Pressure (Admit) 100/60 124/76 108/68 104/68    Blood Pressure (Exercise) 142/80 144/80 140/80 140/80    Blood Pressure (Exit) 118/70 132/82 120/70 128/70    Heart Rate (Admit) 93 bpm 68 bpm 88 bpm 75 bpm    Heart Rate (Exercise) 126 bpm 105 bpm 115 bpm 108 bpm    Heart Rate (Exit) 93 bpm 70 bpm 86 bpm 85 bpm    Rating of Perceived Exertion (Exercise) 11 11 11 12     Symptoms None None None None    Comments Pt's first day of exercise, tolerated well Reviwed Mets Reviewed METs and goals.  Reviewed METs    Duration Progress to 10 minutes continuous walking  at current work load and total walking time to 30-45 min Progress to 30 minutes of  aerobic without signs/symptoms of physical distress Progress to 30 minutes of  aerobic without signs/symptoms of physical distress Progress to 30 minutes of  aerobic without signs/symptoms of physical distress    Intensity THRR unchanged THRR unchanged THRR unchanged THRR unchanged      Progression   Progression Continue to progress workloads to maintain intensity without signs/symptoms of physical distress. Continue to progress workloads to maintain intensity without signs/symptoms of physical distress.  Continue to progress workloads to maintain intensity without signs/symptoms of physical distress.  Continue to progress workloads to maintain intensity without signs/symptoms of physical distress.    Average METs 3.1 3.65 4.4 4.9      Resistance Training   Training Prescription Yes Yes No Yes    Weight 4 4 --  No weights on Wednesday will do 4 lbs.  4    Reps 10-15 10-15 -- 10-15    Time -- -- -- 10 Minutes      Interval Training   Interval Training -- -- No No      NuStep   Level 3 -- 5 6    SPM 60 -- -- --    Minutes 15 -- 15 15    METs 3.3 -- 4.8 5      Track   Laps 17 19 27 22     Minutes 15 15 15  --    METs 2.97 3.21 6.15 4.83      Home Exercise Plan   Plans to continue exercise at -- Home (comment) Home (comment) Home (comment)    Frequency -- Add 3 additional days to program exercise sessions. Add 3 additional days to program exercise sessions. Add 3 additional days to program exercise sessions.    Initial Home Exercises Provided -- 08/29/19 08/29/19 08/29/19           Exercise Comments:  Exercise Comments    Row Name 08/29/19 1617 09/14/19 1415 09/23/19 1613       Exercise Comments Reviewed METs and home exercise Rx with patient. Pt was provided copy of prescription and he verbalized understanding of the RX. Reviewed METs and goals. Progressing well in CRP2 and is walking at home 4x/wk. Reviwed METs today. Continues to progress and conties walking at home 4x/wk 45 + minutes            Exercise Goals and Review:  Exercise Goals    Row Name 08/09/19 0920             Exercise Goals   Increase Physical Activity Yes       Intervention Provide advice, education, support and counseling about physical activity/exercise needs.;Develop an individualized exercise prescription for aerobic and resistive training based on initial evaluation findings, risk stratification, comorbidities and participant's personal goals.       Expected Outcomes Short Term: Attend rehab on a regular basis to increase amount of physical activity.;Long Term: Exercising regularly at least  3-5 days a week.;Long Term: Add in home exercise to make exercise part of routine and to increase amount of physical activity.       Increase Strength and Stamina Yes       Intervention Provide advice, education, support and counseling about physical activity/exercise needs.;Develop an individualized exercise prescription for aerobic and resistive training based on initial evaluation findings, risk stratification, comorbidities and participant's personal goals.       Expected Outcomes Short Term: Increase workloads from initial exercise prescription for resistance, speed, and METs.;Short Term: Perform resistance training exercises routinely during rehab and add in resistance training at home;Long Term: Improve cardiorespiratory fitness, muscular endurance and strength as measured by increased METs and functional capacity (6MWT)       Able to understand and use rate of perceived exertion (RPE) scale Yes       Intervention Provide education and explanation on how to use RPE scale       Expected Outcomes Short Term: Able to use RPE daily in rehab to express  subjective intensity level;Long Term:  Able to use RPE to guide intensity level when exercising independently       Knowledge and understanding of Target Heart Rate Range (THRR) Yes       Intervention Provide education and explanation of THRR including how the numbers were predicted and where they are located for reference       Expected Outcomes Short Term: Able to state/look up THRR;Long Term: Able to use THRR to govern intensity when exercising independently;Short Term: Able to use daily as guideline for intensity in rehab       Able to check pulse independently Yes       Intervention Provide education and demonstration on how to check pulse in carotid and radial arteries.;Review the importance of being able to check your own pulse for safety during independent exercise       Expected Outcomes Short Term: Able to explain why pulse checking is  important during independent exercise;Long Term: Able to check pulse independently and accurately       Understanding of Exercise Prescription Yes       Intervention Provide education, explanation, and written materials on patient's individual exercise prescription       Expected Outcomes Short Term: Able to explain program exercise prescription;Long Term: Able to explain home exercise prescription to exercise independently              Exercise Goals Re-Evaluation :  Exercise Goals Re-Evaluation    [place] Name 08/15/19 1619 08/29/19 1619 09/14/19 1615         Exercise Goal Re-Evaluation   Exercise Goals Review Increase Physical Activity;Increase Strength and Stamina;Able to understand and use rate of perceived exertion (RPE) scale;Knowledge and understanding of Target Heart Rate Range (THRR);Understanding of Exercise Prescription Increase Physical Activity;Increase Strength and Stamina;Able to understand and use rate of perceived exertion (RPE) scale;Knowledge and understanding of Target Heart Rate Range (THRR);Able to check pulse independently;Understanding of Exercise Prescription Increase Physical Activity;Increase Strength and Stamina;Able to understand and use rate of perceived exertion (RPE) scale;Knowledge and understanding of Target Heart Rate Range (THRR);Able to check pulse independently;Understanding of Exercise Prescription     Comments Today is patient's first day of CR. He tolerated session well and voiced he "enjoyed it." Reviewed Exercise RX, THRR, and RPE scale. Reviwed MET's with pateint as well as home exercise Rx. See notes. Reviewed METs and goals. Pt voices walking 4x/wk at home for 60 minutes. Pt feels stronger and more energetic. Contines to progress well in CRP2 with no complaints.     Expected Outcomes Will continue to monitor and progress Pt as tolerated. Pt will walk 3-4 days per week for 30-45 minutes and abide by the exercise prescription guidelines. Pt will contine  walking at home and 4x/wk in addtion to CRP2.             Discharge Exercise Prescription (Final Exercise Prescription Changes):  Exercise Prescription Changes - 09/23/19 1600      Response to Exercise   Blood Pressure (Admit) 104/68    Blood Pressure (Exercise) 140/80    Blood Pressure (Exit) 128/70    Heart Rate (Admit) 75 bpm    Heart Rate (Exercise) 108 bpm    Heart Rate (Exit) 85 bpm    Rating of Perceived Exertion (Exercise) 12    Symptoms None    Comments Reviewed METs    Duration Progress to 30 minutes of  aerobic without signs/symptoms of physical distress    Intensity THRR unchanged  Progression   Progression Continue to progress workloads to maintain intensity without signs/symptoms of physical distress.    Average METs 4.9      Resistance Training   Training Prescription Yes    Weight 4    Reps 10-15    Time 10 Minutes      Interval Training   Interval Training No      NuStep   Level 6    Minutes 15    METs 5      Track   Laps 22    METs 4.83      Home Exercise Plan   Plans to continue exercise at Home (comment)    Frequency Add 3 additional days to program exercise sessions.    Initial Home Exercises Provided 08/29/19           Nutrition:  Target Goals: Understanding of nutrition guidelines, daily intake of sodium <1556m, cholesterol <2036m calories 30% from fat and 7% or less from saturated fats, daily to have 5 or more servings of fruits and vegetables.  Biometrics:  Pre Biometrics - 08/09/19 0840      Pre Biometrics   Waist Circumference 33.5 inches    Hip Circumference 41 inches    Waist to Hip Ratio 0.82 %    Triceps Skinfold 19 mm    % Body Fat 24.6 %    Grip Strength 40 kg    Flexibility 17.75 in    Single Leg Stand 6.5 seconds            Nutrition Therapy Plan and Nutrition Goals:   Nutrition Assessments:  Nutrition Assessments - 09/01/19 0710      MEDFICTS Scores   Pre Score 36           Nutrition  Goals Re-Evaluation:   Nutrition Goals Discharge (Final Nutrition Goals Re-Evaluation):   Psychosocial: Target Goals: Acknowledge presence or absence of significant depression and/or stress, maximize coping skills, provide positive support system. Participant is able to verbalize types and ability to use techniques and skills needed for reducing stress and depression.  Initial Review & Psychosocial Screening:  Initial Psych Review & Screening - 08/09/19 1042      Initial Review   Current issues with None Identified      Family Dynamics   Good Support System? Yes    Comments Mr. MoWulffresents to his cardiac rehab orientation appointment with a positive attitude and outlook. Denies psychosocial needs and admits to a strong support system of family and friends. No interventions needed at this time.      Barriers   Psychosocial barriers to participate in program There are no identifiable barriers or psychosocial needs.      Screening Interventions   Interventions Encouraged to exercise           Quality of Life Scores:  Quality of Life - 08/09/19 1123      Quality of Life   Select Quality of Life      Quality of Life Scores   Health/Function Pre 28.5 %    Socioeconomic Pre 25.42 %    Psych/Spiritual Pre 27.86 %    Family Pre 28.8 %    GLOBAL Pre 27.81 %          Scores of 19 and below usually indicate a poorer quality of life in these areas.  A difference of  2-3 points is a clinically meaningful difference.  A difference of 2-3 points in the total score of the Quality of  Life Index has been associated with significant improvement in overall quality of life, self-image, physical symptoms, and general health in studies assessing change in quality of life.  PHQ-9: Recent Review Flowsheet Data    Depression screen [facility] 2/9 08/09/2019   Decreased Interest 0   Down, Depressed, Hopeless 0   PHQ - 2 Score 0     Interpretation of Total Score  Total Score Depression  Severity:  1-4 = Minimal depression, 5-9 = Mild depression, 10-14 = Moderate depression, 15-19 = Moderately severe depression, 20-27 = Severe depression   Psychosocial Evaluation and Intervention:   Psychosocial Re-Evaluation:  Psychosocial Re-Evaluation    [place] Name 09/01/19 1549 09/29/19 1425           Psychosocial Re-Evaluation   Current issues with None Identified Current Stress Concerns      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 Dr Alphonzo Cruise is concerned about his wife has good family support with his children    Interventions Encouraged to attend Cardiac Rehabilitation for the exercise    Continue Psychosocial Services  No Follow up required      Initial Review   Source of Stress Concerns Family           Vocational Rehabilitation: Provide vocational rehab assistance to qualifying candidates.   Vocational Rehab Evaluation & Intervention:   Education: Education Goals: Education classes will be provided on a weekly basis, covering required topics. Participant will state understanding/return demonstration of topics presented.  Learning  Barriers/Preferences:   Education Topics: Hypertension, Hypertension Reduction -Define heart disease and high blood pressure. Discus how high blood pressure affects the body and ways to reduce high blood pressure.   Exercise and Your Heart -Discuss why it is important to exercise, the FITT principles of exercise, normal and abnormal responses to exercise, and how to exercise safely.   Angina -Discuss definition of angina, causes of angina, treatment of angina, and how to decrease risk of having angina.   Cardiac Medications -Review what the following cardiac medications are used for, how they affect the body, and side effects that may occur when taking the medications.  Medications include Aspirin, Beta blockers, calcium channel blockers, ACE Inhibitors, angiotensin receptor blockers, diuretics, digoxin, and antihyperlipidemics.   Congestive Heart Failure -Discuss the definition of CHF, how to live with CHF, the signs and symptoms of CHF, and how keep track of weight and sodium intake.   Heart Disease and Intimacy -Discus the effect sexual activity has on the heart, how changes occur during intimacy as we age, and safety during  sexual activity.   Smoking Cessation / COPD -Discuss different methods to quit smoking, the health benefits of quitting smoking, and the definition of COPD.   Nutrition I: Fats -Discuss the types of cholesterol, what cholesterol does to the heart, and how cholesterol levels can be controlled.   Nutrition II: Labels -Discuss the different components of food labels and how to read food label   Heart Parts/Heart Disease and PAD -Discuss the anatomy of the heart, the pathway of blood circulation through the heart, and these are affected by heart disease.   Stress I: Signs and Symptoms -Discuss the causes of stress, how stress may lead to anxiety and depression, and ways to limit stress.   Stress II: Relaxation -Discuss different types of relaxation  techniques to limit stress.   Warning Signs of Stroke / TIA -Discuss definition of a stroke, what the signs and symptoms are of a stroke, and how to identify when someone is having stroke.   Knowledge Questionnaire Score:  Knowledge Questionnaire Score - 08/09/19 1124      Knowledge Questionnaire Score   Pre Score 21/24           Core Components/Risk Factors/Patient Goals at Admission:  Personal Goals and Risk Factors at Admission - 08/09/19 1125      Core Components/Risk Factors/Patient Goals on Admission   Improve shortness of breath with ADL's Yes    Intervention Provide education, individualized exercise plan and daily activity instruction to help decrease symptoms of SOB with activities of daily living.    Expected Outcomes Short Term: Improve cardiorespiratory fitness to achieve a reduction of symptoms when performing ADLs;Long Term: Be able to perform more ADLs without symptoms or delay the onset of symptoms    Hypertension Yes    Intervention Provide education on lifestyle modifcations including regular physical activity/exercise, weight management, moderate sodium restriction and increased consumption of fresh fruit, vegetables, and low fat dairy, alcohol moderation, and smoking cessation.;Monitor prescription use compliance.    Expected Outcomes Short Term: Continued assessment and intervention until BP is < 140/23m HG in hypertensive participants. < 130/864mHG in hypertensive participants with diabetes, heart failure or chronic kidney disease.;Long Term: Maintenance of blood pressure at goal levels.    Lipids Yes    Intervention Provide education and support for participant on nutrition & aerobic/resistive exercise along with prescribed medications to achieve LDL <703mHDL >23m33m  Expected Outcomes Short Term: Participant states understanding of desired cholesterol values and is compliant with medications prescribed. Participant is following exercise prescription and  nutrition guidelines.;Long Term: Cholesterol controlled with medications as prescribed, with individualized exercise RX and with personalized nutrition plan. Value goals: LDL < 70mg39mL > 40 mg.           Core Components/Risk Factors/Patient Goals Review:   Goals and Risk Factor Review    Row Name 09/01/19 1550 09/29/19 1427           Core Components/Risk Factors/Patient Goals Review   Personal Goals Review Improve shortness of breath with ADL's;Hypertension;Lipids Improve shortness of breath with ADL's;Hypertension;Lipids      Review Dr MorteAlphonzo Cruisedone well with exercise when in attendance for cardiac rehab. Dr MorteJosefa Halfl sgns have been stable Dr MorteAlphonzo Cruisedone well with exercise when in attendance for cardiac rehab. 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 Dr Josefa Half vital sgns have been stable    Expected Outcomes Dr Alphonzo Cruise will continue to partcipate in phase 2 cardiac rehab for exercise,nutrtition and lifestyle modifications           ITP Comments:  ITP Comments    Row Name 08/09/19 0835 09/01/19 1547 09/29/19 1423       ITP Comments Dr. Fransico Him, Medical Director Cardiac Rehab [place] 30 Day ITP Review. Dr Alphonzo Cruise was out last week due to being out of town. Dr Alphonzo Cruise is off to a good start to exercise. 30 Day ITP Review. Dr  Alphonzo Cruise has good participation in phase 2 cardiac rehab when in attendance. Dr Alphonzo Cruise attendance has been sporadic due to vacation and other commitments            Comments: See ITP Comments.Barnet Pall, RN,BSN 09/29/2019 2:28 PM

## 2019-09-30 ENCOUNTER — Encounter (HOSPITAL_COMMUNITY): Payer: Medicare Other

## 2019-10-03 ENCOUNTER — Other Ambulatory Visit: Payer: Self-pay

## 2019-10-03 ENCOUNTER — Encounter (HOSPITAL_COMMUNITY)
Admission: RE | Admit: 2019-10-03 | Discharge: 2019-10-03 | Disposition: A | Discharge status: Home / Self Care | Payer: Medicare Other | Source: Ambulatory Visit | Attending: Cardiology | Admitting: Cardiology

## 2019-10-03 DIAGNOSIS — I341 Nonrheumatic mitral (valve) prolapse: Secondary | ICD-10-CM | POA: Diagnosis not present

## 2019-10-03 DIAGNOSIS — E785 Hyperlipidemia, unspecified: Secondary | ICD-10-CM | POA: Diagnosis not present

## 2019-10-03 DIAGNOSIS — Z9889 Other specified postprocedural states: Secondary | ICD-10-CM

## 2019-10-03 DIAGNOSIS — I10 Essential (primary) hypertension: Secondary | ICD-10-CM | POA: Diagnosis not present

## 2019-10-03 DIAGNOSIS — Z950 Presence of cardiac pacemaker: Secondary | ICD-10-CM | POA: Diagnosis not present

## 2019-10-03 DIAGNOSIS — Z954 Presence of other heart-valve replacement: Secondary | ICD-10-CM | POA: Diagnosis not present

## 2019-10-03 DIAGNOSIS — I340 Nonrheumatic mitral (valve) insufficiency: Secondary | ICD-10-CM | POA: Diagnosis not present

## 2019-10-05 ENCOUNTER — Other Ambulatory Visit: Payer: Self-pay

## 2019-10-05 ENCOUNTER — Encounter (HOSPITAL_COMMUNITY)
Admission: RE | Admit: 2019-10-05 | Discharge: 2019-10-05 | Disposition: A | Discharge status: Home / Self Care | Payer: Medicare Other | Source: Ambulatory Visit | Attending: Cardiology | Admitting: Cardiology

## 2019-10-05 VITALS — Ht 69.5 in | Wt 167.1 lb

## 2019-10-05 DIAGNOSIS — Z954 Presence of other heart-valve replacement: Secondary | ICD-10-CM | POA: Diagnosis not present

## 2019-10-05 DIAGNOSIS — I10 Essential (primary) hypertension: Secondary | ICD-10-CM | POA: Diagnosis not present

## 2019-10-05 DIAGNOSIS — I340 Nonrheumatic mitral (valve) insufficiency: Secondary | ICD-10-CM | POA: Diagnosis not present

## 2019-10-05 DIAGNOSIS — Z9889 Other specified postprocedural states: Secondary | ICD-10-CM

## 2019-10-05 DIAGNOSIS — Z950 Presence of cardiac pacemaker: Secondary | ICD-10-CM | POA: Diagnosis not present

## 2019-10-05 DIAGNOSIS — E785 Hyperlipidemia, unspecified: Secondary | ICD-10-CM | POA: Diagnosis not present

## 2019-10-05 DIAGNOSIS — I341 Nonrheumatic mitral (valve) prolapse: Secondary | ICD-10-CM | POA: Diagnosis not present

## 2019-10-07 ENCOUNTER — Encounter (HOSPITAL_COMMUNITY): Payer: Medicare Other

## 2019-10-10 ENCOUNTER — Other Ambulatory Visit: Payer: Self-pay

## 2019-10-10 ENCOUNTER — Encounter (HOSPITAL_COMMUNITY)
Admission: RE | Admit: 2019-10-10 | Discharge: 2019-10-10 | Disposition: A | Discharge status: Home / Self Care | Payer: Medicare Other | Source: Ambulatory Visit | Attending: Cardiology | Admitting: Cardiology

## 2019-10-10 DIAGNOSIS — I10 Essential (primary) hypertension: Secondary | ICD-10-CM | POA: Diagnosis not present

## 2019-10-10 DIAGNOSIS — I340 Nonrheumatic mitral (valve) insufficiency: Secondary | ICD-10-CM | POA: Insufficient documentation

## 2019-10-10 DIAGNOSIS — Z79899 Other long term (current) drug therapy: Secondary | ICD-10-CM | POA: Insufficient documentation

## 2019-10-10 DIAGNOSIS — R55 Syncope and collapse: Secondary | ICD-10-CM | POA: Diagnosis not present

## 2019-10-10 DIAGNOSIS — Z954 Presence of other heart-valve replacement: Secondary | ICD-10-CM | POA: Diagnosis not present

## 2019-10-10 DIAGNOSIS — E785 Hyperlipidemia, unspecified: Secondary | ICD-10-CM | POA: Diagnosis not present

## 2019-10-10 DIAGNOSIS — I341 Nonrheumatic mitral (valve) prolapse: Secondary | ICD-10-CM | POA: Insufficient documentation

## 2019-10-10 DIAGNOSIS — Z950 Presence of cardiac pacemaker: Secondary | ICD-10-CM | POA: Insufficient documentation

## 2019-10-10 DIAGNOSIS — Z9889 Other specified postprocedural states: Secondary | ICD-10-CM

## 2019-10-10 DIAGNOSIS — Z7901 Long term (current) use of anticoagulants: Secondary | ICD-10-CM | POA: Insufficient documentation

## 2019-10-10 NOTE — Progress Notes (Signed)
Discharge Progress Report  Patient Details  Name: Alexander [name] MRN: [id number] Date of Birth: 10-13-1940 Referring Provider:     [place] from 08/09/2019 in [place]  Referring Provider [name], MD       Number of Visits: 10  Reason for Discharge:  Patient reached a stable level of exercise. Patient independent in their exercise. Patient has met program and personal goals.  Smoking History:  Social History   Tobacco Use  Smoking Status Never Smoker  Smokeless Tobacco Never Used    Diagnosis:  S/P mitral valve repair  ADL [facility]SD:   Initial Exercise Prescription:  Initial Exercise Prescription - 08/09/19 1100      Date of Initial Exercise RX and Referring Provider   Date 08/09/19    Referring Provider [name], MD    Expected Discharge Date 10/07/19      Treadmill   MPH 2.8    Grade 0    Minutes 15    METs 3.14      NuStep   Level 3    SPM 85    Minutes 15    METs 3      Prescription Details   Frequency (times per week) 3    Duration Progress to 30 minutes of continuous aerobic without signs/symptoms of physical distress      Intensity   THRR 40-80% of Max Heartrate 56-112    Ratings of Perceived Exertion 11-13    Perceived Dyspnea 0-4      Progression   Progression Continue to progress workloads to maintain intensity without signs/symptoms of physical distress.      Resistance Training   Training Prescription Yes    Weight 4lbs    Reps 10-15           Discharge Exercise Prescription (Final Exercise Prescription Changes):  Exercise Prescription Changes - 10/10/19 1615      Response to Exercise   Blood Pressure (Admit) 120/70    Blood Pressure (Exercise) 140/64    Blood Pressure (Exit) 122/78    Heart Rate (Admit) 83 bpm    Heart Rate (Exercise) 113 bpm    Heart Rate (Exit) 79 bpm    Rating of Perceived Exertion (Exercise) 11    Symptoms None     Comments Pt completed CRP2 program today    Duration Progress to 30 minutes of  aerobic without signs/symptoms of physical distress    Intensity THRR unchanged      Progression   Average METs 4.6      Resistance Training   Training Prescription Yes    Weight 4 lbs    Reps 10-15    Time 10 Minutes      Interval Training   Interval Training No      NuStep   Level 6    Minutes 15    METs 5.4      Track   METs 3.78      Home Exercise Plan   Plans to continue exercise at Home (comment)    Frequency Add 3 additional days to program exercise sessions.    Initial Home Exercises Provided 08/29/19           Functional Capacity:  6 Minute Walk    Row Name 08/09/19 0931 10/03/19 1621       6 Minute Walk   Phase Initial Discharge    Distance 1890 feet 2081 feet    Distance % Change --  10.11 %    Distance Feet Change -- 209 ft    Walk Time 6 minutes 6 minutes    # of Rest Breaks 0 0    MPH 3.58 3.94    METS 4.12 4.55    RPE 11 11    Perceived Dyspnea  0 0    VO2 Peak 14.43 15.93    Symptoms No No    Resting HR 81 bpm 71 bpm    Resting BP 114/70 117/77    Resting Oxygen Saturation  98 % 98 %    Exercise Oxygen Saturation  during 6 min walk 97 % 97 %    Max Ex. HR 121 bpm 130 bpm    Max Ex. BP 166/72 140/72    2 Minute Post BP 124/84 110/70           Psychological, QOL, Others - Outcomes: PHQ 2/9: Depression screen [facility] 2/9 10/10/2019 08/09/2019  Decreased Interest 0 0  Down, Depressed, Hopeless 0 0  PHQ - 2 Score 0 0    Quality of Life:  Quality of Life - 10/10/19 1630      Quality of Life   Select Quality of Life      Quality of Life Scores   Health/Function Post 28.57 %    Socioeconomic Post 26.61 %    Psych/Spiritual Post 29.14 %    Family Post 26.8 %    GLOBAL Post 27.92 %           Personal Goals: Goals established at orientation with interventions provided to work toward goal.  Personal Goals and Risk Factors at Admission - 08/09/19 1125       Core Components/Risk Factors/Patient Goals on Admission   Improve shortness of breath with ADL's Yes    Intervention Provide education, individualized exercise plan and daily activity instruction to help decrease symptoms of SOB with activities of daily living.    Expected Outcomes Short Term: Improve cardiorespiratory fitness to achieve a reduction of symptoms when performing ADLs;Long Term: Be able to perform more ADLs without symptoms or delay the onset of symptoms    Hypertension Yes    Intervention Provide education on lifestyle modifcations including regular physical activity/exercise, weight management, moderate sodium restriction and increased consumption of fresh fruit, vegetables, and low fat dairy, alcohol moderation, and smoking cessation.;Monitor prescription use compliance.    Expected Outcomes Short Term: Continued assessment and intervention until BP is < 140/36m HG in hypertensive participants. < 130/859mHG in hypertensive participants with diabetes, heart failure or chronic kidney disease.;Long Term: Maintenance of blood pressure at goal levels.    Lipids Yes    Intervention Provide education and support for participant on nutrition & aerobic/resistive exercise along with prescribed medications to achieve LDL <7041mHDL >59m81m  Expected Outcomes Short Term: Participant states understanding of desired cholesterol values and is compliant with medications prescribed. Participant is following exercise prescription and nutrition guidelines.;Long Term: Cholesterol controlled with medications as prescribed, with individualized exercise RX and with personalized nutrition plan. Value goals: LDL < 70mg35mL > 40 mg.            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 Dr Josefa Half vital sgns have been stable Dr Alphonzo Cruise has done well with exercise when in attendance for cardiac rehab. Dr Josefa Half vital sgns have been stable Dr Alphonzo Cruise has done well with exercise when in attendance for cardiac rehab. Dr Josefa Half vital sgns have been stable. 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     Expected Outcomes Dr Alphonzo Cruise will continue to partcipate in phase 2 cardiac rehab for exercise,nutrtition and lifestyle modifications Dr Alphonzo Cruise will continue to partcipate in phase 2 cardiac rehab for exercise,nutrtition and lifestyle modifications Dr Alphonzo Cruise will continue to walk upon discharge follow lifestyle and nutriton modifications upon discharge from phase 2 cardiac rehab            Exercise Goals and Review:  Exercise Goals    Row Name 08/09/19 0920             Exercise Goals   Increase Physical Activity Yes       Intervention Provide advice, education, support and counseling about physical activity/exercise needs.;Develop an individualized exercise prescription for aerobic and resistive training based on initial evaluation findings, risk stratification, comorbidities and participant's personal goals.       Expected Outcomes Short Term: Attend rehab on a regular basis to increase amount of physical activity.;Long Term: Exercising regularly at least 3-5 days a week.;Long Term: Add in home exercise to make exercise part of routine and to increase amount of physical activity.       Increase Strength and Stamina Yes       Intervention Provide advice, education, support and counseling about physical activity/exercise needs.;Develop an individualized exercise prescription for aerobic and resistive training based on initial evaluation findings, risk  stratification, comorbidities and participant's personal goals.       Expected Outcomes Short Term: Increase workloads from initial exercise prescription for resistance, speed, and METs.;Short Term: Perform resistance training exercises routinely during rehab and add in resistance training at home;Long Term: Improve cardiorespiratory fitness, muscular endurance and strength as measured by increased METs and functional capacity (6MWT)       Able to understand and use rate of perceived exertion (RPE) scale Yes       Intervention Provide education and explanation on how to use RPE scale       Expected Outcomes Short Term: Able to use RPE daily in rehab to express subjective intensity level;Long Term:  Able to use RPE to guide intensity level when exercising independently       Knowledge and understanding of Target Heart Rate Range (THRR) Yes       Intervention Provide education and explanation of THRR including how the numbers were predicted and where they are located for reference       Expected Outcomes Short Term: Able to state/look up THRR;Long Term: Able to use THRR to govern intensity when exercising independently;Short Term: Able to use daily as guideline for intensity in rehab       Able to check pulse independently Yes       Intervention Provide education and demonstration on how to check pulse in carotid and radial arteries.;Review the importance of being able to check your own pulse for safety during independent exercise       Expected Outcomes Short Term: Able to explain why pulse checking is important during independent exercise;Long Term: Able to check pulse independently and accurately       Understanding of Exercise Prescription Yes  Intervention Provide education, explanation, and written materials on patient's individual exercise prescription       Expected Outcomes Short Term: Able to explain program exercise prescription;Long Term: Able to explain home exercise prescription to  exercise independently              Exercise Goals Re-Evaluation:  Exercise Goals Re-Evaluation    Row Name 08/15/19 1619 08/29/19 1619 09/14/19 1615 10/10/19 1630       Exercise Goal Re-Evaluation   Exercise Goals Review Increase Physical Activity;Increase Strength and Stamina;Able to understand and use rate of perceived exertion (RPE) scale;Knowledge and understanding of Target Heart Rate Range (THRR);Understanding of Exercise Prescription Increase Physical Activity;Increase Strength and Stamina;Able to understand and use rate of perceived exertion (RPE) scale;Knowledge and understanding of Target Heart Rate Range (THRR);Able to check pulse independently;Understanding of Exercise Prescription Increase Physical Activity;Increase Strength and Stamina;Able to understand and use rate of perceived exertion (RPE) scale;Knowledge and understanding of Target Heart Rate Range (THRR);Able to check pulse independently;Understanding of Exercise Prescription Increase Physical Activity;Able to understand and use rate of perceived exertion (RPE) scale;Increase Strength and Stamina;Knowledge and understanding of Target Heart Rate Range (THRR);Able to check pulse independently;Understanding of Exercise Prescription    Comments Today is patient's first day of CR. He tolerated session well and voiced he "enjoyed it." Reviewed Exercise RX, THRR, and RPE scale. Reviwed MET's with pateint as well as home exercise Rx. See notes. Reviewed METs and goals. Pt voices walking 4x/wk at home for 60 minutes. Pt feels stronger and more energetic. Contines to progress well in CRP2 with no complaints. Pt graduated the CRP2 progam today. Pt progressed well through program and had an average MET level of 4.6 METS. Pt will continue by walking at home daily for 45 minutes.    Expected Outcomes Will continue to monitor and progress Pt as tolerated. Pt will walk 3-4 days per week for 30-45 minutes and abide by the exercise prescription  guidelines. Pt will contine walking at home and 4x/wk in addtion to CRP2. Pt will continue to exercise at home daily.           Nutrition & Weight - Outcomes:  Pre Biometrics - 08/09/19 0840      Pre Biometrics   Waist Circumference 33.5 inches    Hip Circumference 41 inches    Waist to Hip Ratio 0.82 %    Triceps Skinfold 19 mm    % Body Fat 24.6 %    Grip Strength 40 kg    Flexibility 17.75 in    Single Leg Stand 6.5 seconds           Post Biometrics - 10/05/19 1612       Post  Biometrics   Height 5' 9.5" (1.765 m)    Weight 75.8 kg    Waist Circumference 32.5 inches    Hip Circumference 40.5 inches    Waist to Hip Ratio 0.8 %    BMI (Calculated) 24.33    Triceps Skinfold 19 mm    % Body Fat 24 %    Grip Strength 41 kg    Flexibility 13 in   Pt only did once, declined 2 addtional tries   Single Leg Stand 9.68 seconds           Nutrition:   Nutrition Discharge:  Nutrition Assessments - 10/13/19 0944      MEDFICTS Scores   Post Score 12           Education Questionnaire Score:  Knowledge Questionnaire Score - 10/11/19 1207      Knowledge Questionnaire Score   Post Score 22/24           Goals reviewed with patient; copy given to patient.Pt graduated from cardiac rehab program today with completion of 10 exercise sessions in Phase II. Pt maintained fiar attendance and progressed nicely during his participation in rehab as evidenced by increased MET level. 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    Medication list reconciled. Repeat  PHQ score- 0 .  Pt has made significant lifestyle changes and should be commended for his success.Dr Alphonzo Cruise increased his distance on his post exercise walk test by 209 feet. 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 Pt feels he has achieved his goals during cardiac rehab.   Pt plans to continue exercise by walking at  home. 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

## 2019-10-11 ENCOUNTER — Telehealth: Payer: Self-pay | Admitting: Cardiology

## 2019-10-11 NOTE — Telephone Encounter (Signed)
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

## 2019-10-11 NOTE — Telephone Encounter (Signed)
Spoke with pt and at this time there is no findings in epic re sleep study Will forward to [facility] and Dr [name] to follow up on this.Pt aware./cy

## 2019-10-12 ENCOUNTER — Telehealth: Payer: Self-pay | Admitting: *Deleted

## 2019-10-12 NOTE — Telephone Encounter (Signed)
Staff message sent to Gae Bon ok to schedule sleep study. Patient has traditional medicare and does not require a PA.

## 2019-10-12 NOTE — Telephone Encounter (Signed)
Gae Bon I sent this to you on 7/17 and patient says he has not heard anything.  Please call patient

## 2019-10-12 NOTE — Telephone Encounter (Signed)
Spoke to patient on 09/26/19 and he verbalized at that time he did not want to commit to getting a cpap. I will mail a copy of his study results that he is requesting.

## 2019-10-24 DIAGNOSIS — H6121 Impacted cerumen, right ear: Secondary | ICD-10-CM | POA: Diagnosis not present

## 2019-11-02 ENCOUNTER — Other Ambulatory Visit: Payer: Self-pay

## 2019-11-02 ENCOUNTER — Encounter: Payer: Self-pay | Admitting: Cardiology

## 2019-11-02 ENCOUNTER — Telehealth: Payer: Self-pay | Admitting: Cardiology

## 2019-11-02 ENCOUNTER — Ambulatory Visit (INDEPENDENT_AMBULATORY_CARE_PROVIDER_SITE_OTHER): Payer: Medicare Other | Admitting: Cardiology

## 2019-11-02 VITALS — BP 120/76 | HR 74 | Ht 70.0 in | Wt 169.2 lb

## 2019-11-02 DIAGNOSIS — G4733 Obstructive sleep apnea (adult) (pediatric): Secondary | ICD-10-CM | POA: Diagnosis not present

## 2019-11-02 DIAGNOSIS — R0683 Snoring: Secondary | ICD-10-CM | POA: Diagnosis not present

## 2019-11-02 DIAGNOSIS — Z950 Presence of cardiac pacemaker: Secondary | ICD-10-CM

## 2019-11-02 DIAGNOSIS — Z5181 Encounter for therapeutic drug level monitoring: Secondary | ICD-10-CM | POA: Diagnosis not present

## 2019-11-02 DIAGNOSIS — I340 Nonrheumatic mitral (valve) insufficiency: Secondary | ICD-10-CM | POA: Diagnosis not present

## 2019-11-02 DIAGNOSIS — E785 Hyperlipidemia, unspecified: Secondary | ICD-10-CM

## 2019-11-02 DIAGNOSIS — Z9889 Other specified postprocedural states: Secondary | ICD-10-CM | POA: Diagnosis not present

## 2019-11-02 NOTE — Telephone Encounter (Signed)
Alexander [name] is calling requesting a call in regards to his sleep study results and what the next steps are. Please advise

## 2019-11-02 NOTE — Patient Instructions (Signed)
Medication Instructions:   Your physician recommends that you continue on your current medications as directed. Please refer to the Current Medication list given to you today.  *If you need a refill on your cardiac medications before your next appointment, please call your pharmacy*   Testing/Procedures:  Your physician has requested that you have an echocardiogram. Echocardiography is a painless test that uses sound waves to create images of your heart. It provides your doctor with information about the size and shape of your heart and how well your hearts chambers and valves are working. This procedure takes approximately one hour. There are no restrictions for this procedure.  PLEASE SCHEDULE THIS PRIOR TO HIS 6 MONTH FOLLOW-UP APPOINTMENT WITH DR. [name].   Follow-Up: At [facility], you and your health needs are our priority.  As part of our continuing mission to provide you with exceptional heart care, we have created designated Provider Care Teams.  These Care Teams include your primary Cardiologist (physician) and Advanced Practice Providers (APPs -  Physician Assistants and Nurse Practitioners) who all work together to provide you with the care you need, when you need it.  We recommend signing up for the patient portal called "MyChart".  Sign up information is provided on this After Visit Summary.  MyChart is used to connect with patients for Virtual Visits (Telemedicine).  Patients are able to view lab/test results, encounter notes, upcoming appointments, etc.  Non-urgent messages can be sent to your provider as well.   To learn more about what you can do with MyChart, go to [email or web address].    Your next appointment:   6 month(s)  The format for your next appointment:   In Person  Provider:   [name], MD--PLEASE SCHEDULE ECHO A FEW DAYS PRIOR TO [place]

## 2019-11-02 NOTE — Progress Notes (Signed)
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  Patient underwent mitral valve repair via right minithoracotomy 06/14/2019 by Dr.Owen.  He developed postop atrial fibrillation started on amiodarone and converted to normal sinus rhythm.  Preop cath no evidence of CAD.  Normal right and left heart pressures.  He has been doing great early post surgery, walking several miles a day playing golf almost on a daily basis. His Coumadin was discontinued after 3 months, currently on aspirin only.  He finds no limitation to his activities, no shortness of breath or chest pain.  No palpitations no lower extremity edema, no presyncope or syncope.  He is being evaluated for sleep apnea and is awaiting results that he wants to discuss with Dr. Radford [name].  Past Medical History:  Diagnosis Date  . Heart murmur   . Hyperlipidemia   . Hypertension   . Mitral regurgitation   . MVP (mitral valve prolapse)   . Presence of permanent cardiac pacemaker   . Snoring 09/07/2019  . Syncope     Past Surgical History:  Procedure Laterality Date  . BUBBLE STUDY  05/23/2019   Procedure: BUBBLE STUDY;  Surgeon: [name], MD;  Location: [place];  Service: Cardiovascular;;  . HERNIA REPAIR    . MITRAL VALVE REPAIR Right 06/14/2019   Procedure: MINIMALLY INVASIVE MITRAL VALVE REPAIR (MVR)<TEE;  Surgeon: [name], MD;  Location: [facility];  Service: Open Heart Surgery;  Laterality: Right;  . PACEMAKER IMPLANT N/A 10/12/2017   Procedure: PACEMAKER IMPLANT;  Surgeon: Constance Haw, MD;  Location: [facility];  Service: Cardiovascular;  Laterality: N/A;  . RIGHT/LEFT HEART CATH AND CORONARY ANGIOGRAPHY N/A 06/06/2019   Procedure: RIGHT/LEFT HEART CATH AND CORONARY ANGIOGRAPHY;  Surgeon: Burnell Blanks, MD;  Location: [facility];  Service: Cardiovascular;  Laterality: N/A;  . TEE WITHOUT CARDIOVERSION N/A 05/23/2019   Procedure: TRANSESOPHAGEAL ECHOCARDIOGRAM (TEE);  Surgeon: [name], MD;  Location: [facility];  Service: Cardiovascular;  Laterality: N/A;  . TEE WITHOUT CARDIOVERSION N/A 06/14/2019   Procedure: TRANSESOPHAGEAL ECHOCARDIOGRAM (TEE);  Surgeon: [name], MD;  Location: [facility];  Service: Open Heart Surgery;  Laterality: N/A;  . TONSILECTOMY, ADENOIDECTOMY, BILATERAL MYRINGOTOMY AND TUBES     as child  . TONSILLECTOMY    . TOTAL KNEE ARTHROPLASTY Left   . TOTAL KNEE ARTHROPLASTY Left 02/23/2019   Procedure: TOTAL KNEE ARTHROPLASTY;  Surgeon: Gaynelle Arabian, MD;  Location[facility];  Service: Orthopedics;  Laterality: Left;  30min    Current Medications: Current Meds  Medication Sig  . Ascorbic Acid (VITAMIN C) 1000 MG tablet Take 1,000 mg by mouth daily.  Marland Kitchen aspirin EC 81 MG tablet Take 1 tablet (81 mg total) by mouth daily. Swallow whole.  . ezetimibe (ZETIA) 10 MG tablet Take 10 mg by mouth daily.  . Multiple Vitamin (MULTIVITAMIN WITH MINERALS) TABS tablet Take 1 tablet by mouth daily.     Allergies:   Patient has no known allergies.   Social History   Socioeconomic History  . Marital status:  Married    Spouse name: Not on file  . Number of children: Not on file  . Years of education: Not on file  . Highest education level: Not on file  Occupational History  . Not on file  Tobacco Use  . Smoking status: Never Smoker  . Smokeless tobacco: Never Used  Vaping Use  . Vaping Use: Never used  Substance and Sexual Activity  . Alcohol use: Yes    Alcohol/week: 5.0 standard drinks    Types: 5 Glasses of  wine per week  . Drug use: No  . Sexual activity: Not on file  Other Topics Concern  . Not on file  Social History Narrative  . Not on file   Social Determinants of Health   Financial Resource Strain:   . Difficulty of Paying Living Expenses: Not on file  Food Insecurity:   . Worried About Charity fundraiser in the Last Year: Not on file  . Ran Out of Food in the Last Year: Not on file  Transportation Needs:   . Lack of Transportation (Medical): Not on file  . Lack of Transportation (Non-Medical): Not on file  Physical Activity:   . Days of Exercise per Week: Not on file  . Minutes of Exercise per Session: Not on file  Stress:   . Feeling of Stress : Not on file  Social Connections:   . Frequency of Communication with Friends and Family: Not on file  . Frequency of Social Gatherings with Friends and Family: Not on file  . Attends Religious Services: Not on file  . Active Member of Clubs or Organizations: Not on file  . Attends Archivist Meetings: Not on file  . Marital Status: Not on file     Family History:  The patient's   family history includes Alzheimer's disease in his mother; Hypertension in his father.   ROS:   Please see the history of present illness.    ROS All other systems reviewed and are negative.   PHYSICAL EXAM:   VS:  BP 120/76   Pulse 74   Ht 5\' 10"  (1.778 m)   Wt 169 lb 3.2 oz (76.7 kg)   SpO2 96%   BMI 24.28 kg/m   Physical Exam  GYI:RSWN, in no acute distress  Neck: no JVD, carotid bruits, or masses Cardiac:RRR; minimal   Respiratory:  clear to auscultation bilaterally, normal work of breathing GI: soft, nontender, nondistended, + BS Ext: without cyanosis, clubbing, or edema, Good distal pulses bilaterally MS: no deformity or atrophy  Skin: warm and dry, no rash Neuro:  Alert and Oriented x 3 Psych: euthymic mood, full affect  Wt Readings from Last 3 Encounters:  11/02/19 169 lb 3.2 oz (76.7 kg)  10/05/19 167 lb 1.7 oz  (75.8 kg)  09/26/19 168 lb 9.6 oz (76.5 kg)    Studies/Labs Reviewed:   EKG:  EKG is not ordered today.    Recent Labs: 02/10/2019: NT-Pro BNP 139; TSH 1.310 06/10/2019: ALT 18 06/15/2019: Magnesium 2.6 06/19/2019: BUN 11; Creatinine, Ser 0.77; Hemoglobin 10.9; Platelets 154; Potassium 3.8; Sodium 140   Lipid Panel    Component Value Date/Time   CHOL 169 02/10/2019 1429   TRIG 80 02/10/2019 1429   HDL 81 02/10/2019 1429   CHOLHDL 2.1 02/10/2019 1429   LDLCALC 73 02/10/2019 1429    Additional studies/ records that were reviewed today include:         CARDIOTHORACIC SURGERY OPERATIVE NOTE   Date  of Procedure:                06/14/2019   Preoperative Diagnosis:      Severe Mitral Regurgitation  Minimally-Invasive Mitral Valve Repair             Complex valvuloplasty including artificial Gore-tex neochord placement x12             Sorin Memo 4D Ring Annuloplasty (size 60mm, catalog # [id number], serial # [id number])           Surgeon:        Valentina Gu. [name], MD Operative Findings: ? Barlow's type myxomatous degenerative disease ? Bileaflet prolapse with multiple elongated chordae tendinae ? Type II dysfunction with severe mitral regurgitation ? Normal left ventricular systolic function ? No residual mitral regurgitation after successful valve repair  Cardiac catheterization 06/06/2019 1. No angiographic evidence of CAD 2. Normal right and left sided pressures   Continue with planning for mitral valve surgery.    ASSESSMENT:    1. S/P mitral valve repair   2. Encounter for therapeutic drug monitoring   3. 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   His post op echocardiogram on 08/10/2019 shows normal mean gradient of 2 mmHg, there is only trivial mitral  regurgitation.    Postop atrial fibrillation - He remains in sinus rhythm off amiodarone.  Status post pacemaker for sinus arrest -functioning well, followed by Dr. Curt Bears  Hyperlipidemia Crestor stopped because of memory impairment calcium score was 94 which was 27th percentile but cardiac cath no evidence of coronary disease  Obstructive sleep apnea -we will arrange for appointment with Dr. Radford [name] to discuss his results and potential treatments.  Medication Adjustments/Labs and Tests Ordered: Current medicines are reviewed at length with the patient today.  Concerns regarding medicines are outlined above.  Medication changes, Labs and Tests ordered today are listed in the Patient Instructions below. Patient Instructions  Medication Instructions:   Your physician recommends that you continue on your current medications as directed. Please refer to the Current Medication list given to you today.  *If you need a refill on your cardiac medications before your next appointment, please call your pharmacy*   Testing/Procedures:  Your physician has requested that you have an echocardiogram. Echocardiography is a painless test that uses sound waves to create images of your heart. It provides your doctor with information about the size and shape of your heart and how well your heart's chambers and valves are working. This procedure takes approximately one hour. There are no restrictions for this procedure.  PLEASE SCHEDULE THIS PRIOR TO HIS 6 MONTH FOLLOW-UP APPOINTMENT WITH DR. [name].   Follow-Up: At [facility], you and your health needs are our priority.  As part of our continuing mission to provide you with exceptional heart care, we have created designated Provider Care Teams.  These Care Teams include your primary Cardiologist (physician) and Advanced Practice Providers (APPs -  Physician Assistants and Nurse Practitioners) who all work together to provide you with the care you need,  when you need it.  We recommend signing up for the patient portal called "MyChart".  Sign up information is provided on this After Visit Summary.  MyChart is used to connect with patients for Virtual Visits (Telemedicine).  Patients are  able to view lab/test results, encounter notes, upcoming appointments, etc.  Non-urgent messages can be sent to your provider as well.   To learn more about what you can do with MyChart, go to [email or web address].    Your next appointment:   6 month(s)  The format for your next appointment:   In Person  Provider:   [name], MD--PLEASE SCHEDULE ECHO A FEW DAYS PRIOR TO [place], [name], MD  11/02/2019 5:50 PM    [address], [place], [place]  [address] Phone: [phone]; Fax: [phone]

## 2019-11-03 ENCOUNTER — Telehealth: Payer: Medicare Other | Admitting: Cardiology

## 2019-11-03 ENCOUNTER — Telehealth: Payer: Self-pay | Admitting: *Deleted

## 2019-11-03 NOTE — Telephone Encounter (Signed)
Reached out to patient and per dpr spoke to [place] who confirmed an appointment for 12/20/19 for patient to discuss his sleep study results and next plan of care.

## 2019-11-03 NOTE — Telephone Encounter (Signed)
°  Patient Consent for Virtual Visit         Alexander [name] has provided verbal consent on 11/03/2019 for a virtual visit (video or telephone).   CONSENT FOR VIRTUAL VISIT FOR:  Alexander [name]  By participating in this virtual visit I agree to the following:  I hereby voluntarily request, consent and authorize [facility] and its employed or contracted physicians, physician assistants, nurse practitioners or other licensed health care professionals (the Practitioner), to provide me with telemedicine health care services (the Services") as deemed necessary by the treating Practitioner. I acknowledge and consent to receive the Services by the Practitioner via telemedicine. I understand that the telemedicine visit will involve communicating with the Practitioner through live audiovisual communication technology and the disclosure of certain medical information by electronic transmission. I acknowledge that I have been given the opportunity to request an in-person assessment or other available alternative prior to the telemedicine visit and am voluntarily participating in the telemedicine visit.  I understand that I have the right to withhold or withdraw my consent to the use of telemedicine in the course of my care at any time, without affecting my right to future care or treatment, and that the Practitioner or I may terminate the telemedicine visit at any time. I understand that I have the right to inspect all information obtained and/or recorded in the course of the telemedicine visit and may receive copies of available information for a reasonable fee.  I understand that some of the potential risks of receiving the Services via telemedicine include:   Delay or interruption in medical evaluation due to technological equipment failure or disruption;  Information transmitted may not be sufficient (e.g. poor resolution of images) to allow for appropriate medical decision making by the  Practitioner; and/or   In rare instances, security protocols could fail, causing a breach of personal health information.  Furthermore, I acknowledge that it is my responsibility to provide information about my medical history, conditions and care that is complete and accurate to the best of my ability. I acknowledge that Practitioner's advice, recommendations, and/or decision may be based on factors not within their control, such as incomplete or inaccurate data provided by me or distortions of diagnostic images or specimens that may result from electronic transmissions. I understand that the practice of medicine is not an exact science and that Practitioner makes no warranties or guarantees regarding treatment outcomes. I acknowledge that a copy of this consent can be made available to me via my patient portal ([facility]), or I can request a printed copy by calling the office of [facility].    I understand that my insurance will be billed for this visit.   I have read or had this consent read to me.  I understand the contents of this consent, which adequately explains the benefits and risks of the Services being provided via telemedicine.   I have been provided ample opportunity to ask questions regarding this consent and the Services and have had my questions answered to my satisfaction.  I give my informed consent for the services to be provided through the use of telemedicine in my medical care

## 2019-11-04 ENCOUNTER — Ambulatory Visit: Payer: Medicare Other | Admitting: Cardiology

## 2019-11-07 ENCOUNTER — Telehealth: Payer: Medicare Other | Admitting: Cardiology

## 2019-11-08 ENCOUNTER — Ambulatory Visit: Payer: Medicare Other | Attending: Internal Medicine

## 2019-11-08 DIAGNOSIS — Z23 Encounter for immunization: Secondary | ICD-10-CM

## 2019-11-08 NOTE — Progress Notes (Signed)
   Covid-19 Vaccination Clinic  Name:  Alexander [name]    MRN: [id number] DOB: 04-01-1940  11/08/2019  [name] was observed post Covid-19 immunization for 15 minutes without incident. He was provided with Vaccine Information Sheet and instruction to access the V-Safe system.   [name] was instructed to call 911 with any severe reactions post vaccine: Marland Kitchen Difficulty breathing  . Swelling of face and throat  . A fast heartbeat  . A bad rash all over body  . Dizziness and weakness

## 2019-11-18 ENCOUNTER — Other Ambulatory Visit: Payer: Self-pay | Admitting: Cardiology

## 2019-11-18 DIAGNOSIS — E7849 Other hyperlipidemia: Secondary | ICD-10-CM

## 2019-11-18 DIAGNOSIS — I4510 Unspecified right bundle-branch block: Secondary | ICD-10-CM

## 2019-11-18 DIAGNOSIS — I10 Essential (primary) hypertension: Secondary | ICD-10-CM

## 2019-11-18 DIAGNOSIS — R55 Syncope and collapse: Secondary | ICD-10-CM

## 2019-11-18 DIAGNOSIS — I341 Nonrheumatic mitral (valve) prolapse: Secondary | ICD-10-CM

## 2019-11-18 DIAGNOSIS — E785 Hyperlipidemia, unspecified: Secondary | ICD-10-CM

## 2019-12-13 ENCOUNTER — Other Ambulatory Visit: Payer: Medicare Other

## 2019-12-13 DIAGNOSIS — Z20822 Contact with and (suspected) exposure to covid-19: Secondary | ICD-10-CM | POA: Diagnosis not present

## 2019-12-14 DIAGNOSIS — Z23 Encounter for immunization: Secondary | ICD-10-CM | POA: Diagnosis not present

## 2019-12-14 LAB — SARS-COV-2, NAA 2 DAY TAT

## 2019-12-14 LAB — NOVEL CORONAVIRUS, NAA: SARS-CoV-2, NAA: NOT DETECTED

## 2019-12-16 ENCOUNTER — Other Ambulatory Visit: Payer: Medicare Other

## 2019-12-16 DIAGNOSIS — Z20822 Contact with and (suspected) exposure to covid-19: Secondary | ICD-10-CM

## 2019-12-16 DIAGNOSIS — H6121 Impacted cerumen, right ear: Secondary | ICD-10-CM | POA: Diagnosis not present

## 2019-12-17 LAB — SARS-COV-2, NAA 2 DAY TAT

## 2019-12-17 LAB — NOVEL CORONAVIRUS, NAA: SARS-CoV-2, NAA: NOT DETECTED

## 2019-12-20 ENCOUNTER — Ambulatory Visit (INDEPENDENT_AMBULATORY_CARE_PROVIDER_SITE_OTHER): Payer: Medicare Other

## 2019-12-20 ENCOUNTER — Ambulatory Visit: Payer: Medicare Other | Admitting: Cardiology

## 2019-12-20 DIAGNOSIS — I495 Sick sinus syndrome: Secondary | ICD-10-CM

## 2019-12-20 LAB — CUP PACEART REMOTE DEVICE CHECK
Battery Remaining Longevity: 144 mo
Battery Voltage: 3.02 V
Brady Statistic AP VP Percent: 0 %
Brady Statistic AP VS Percent: 11.32 %
Brady Statistic AS VP Percent: 0.03 %
Brady Statistic AS VS Percent: 88.65 %
Brady Statistic RA Percent Paced: 11.69 %
Brady Statistic RV Percent Paced: 0.03 %
Date Time Interrogation Session: 20211012000817
Implantable Lead Implant Date: 20190805
Implantable Lead Implant Date: 20190805
Implantable Lead Location: 753859
Implantable Lead Location: 753860
Implantable Lead Model: 5076
Implantable Lead Model: 5076
Implantable Pulse Generator Implant Date: 20190805
Lead Channel Impedance Value: 285 Ohm
Lead Channel Impedance Value: 361 Ohm
Lead Channel Impedance Value: 399 Ohm
Lead Channel Impedance Value: 437 Ohm
Lead Channel Pacing Threshold Amplitude: 0.625 V
Lead Channel Pacing Threshold Amplitude: 0.75 V
Lead Channel Pacing Threshold Pulse Width: 0.4 ms
Lead Channel Pacing Threshold Pulse Width: 0.4 ms
Lead Channel Sensing Intrinsic Amplitude: 4.125 mV
Lead Channel Sensing Intrinsic Amplitude: 4.125 mV
Lead Channel Sensing Intrinsic Amplitude: 4.125 mV
Lead Channel Sensing Intrinsic Amplitude: 4.125 mV
Lead Channel Setting Pacing Amplitude: 2 V
Lead Channel Setting Pacing Amplitude: 2.5 V
Lead Channel Setting Pacing Pulse Width: 0.4 ms
Lead Channel Setting Sensing Sensitivity: 1.2 mV

## 2019-12-21 NOTE — Progress Notes (Signed)
Remote pacemaker transmission.   

## 2019-12-28 ENCOUNTER — Other Ambulatory Visit: Payer: Medicare Other

## 2019-12-28 DIAGNOSIS — Z20822 Contact with and (suspected) exposure to covid-19: Secondary | ICD-10-CM

## 2019-12-29 ENCOUNTER — Encounter: Payer: Self-pay | Admitting: Cardiology

## 2019-12-29 ENCOUNTER — Other Ambulatory Visit: Payer: Medicare Other

## 2019-12-29 ENCOUNTER — Other Ambulatory Visit: Payer: Self-pay

## 2019-12-29 ENCOUNTER — Ambulatory Visit: Payer: Medicare Other | Admitting: Cardiology

## 2019-12-29 ENCOUNTER — Ambulatory Visit (INDEPENDENT_AMBULATORY_CARE_PROVIDER_SITE_OTHER): Payer: Medicare Other | Admitting: Cardiology

## 2019-12-29 VITALS — BP 112/70 | HR 86 | Ht 69.0 in | Wt 168.2 lb

## 2019-12-29 DIAGNOSIS — I10 Essential (primary) hypertension: Secondary | ICD-10-CM

## 2019-12-29 DIAGNOSIS — G4733 Obstructive sleep apnea (adult) (pediatric): Secondary | ICD-10-CM | POA: Diagnosis not present

## 2019-12-29 LAB — NOVEL CORONAVIRUS, NAA: SARS-CoV-2, NAA: NOT DETECTED

## 2019-12-29 LAB — SARS-COV-2, NAA 2 DAY TAT

## 2019-12-29 NOTE — Patient Instructions (Signed)
Medication Instructions:  Your physician recommends that you continue on your current medications as directed. Please refer to the Current Medication list given to you today.  *If you need a refill on your cardiac medications before your next appointment, please call your pharmacy*  Follow-Up: At [facility], you and your health needs are our priority.  As part of our continuing mission to provide you with exceptional heart care, we have created designated Provider Care Teams.  These Care Teams include your primary Cardiologist (physician) and Advanced Practice Providers (APPs -  Physician Assistants and Nurse Practitioners) who all work together to provide you with the care you need, when you need it.  Follow up with Dr. Radford Pax after visit with ENT.   Other Instructions You have been referred to Dr. Redmond Baseman - an Ear, Nose and Throat Specialist. They will be in contact with you to set up an appointment.

## 2019-12-29 NOTE — Addendum Note (Signed)
Addended by: Antonieta Iba on: 12/29/2019 09:16 AM   Modules accepted: Orders

## 2019-12-29 NOTE — Progress Notes (Addendum)
Cardiology Office Note:    Date:  12/29/2019   ID:  Alexander [name], DOB 1940-08-13, MRN [id number]  PCP:  [name], MD  Cardiologist:  [name], MD    Referring MD: [name], MD   Chief Complaint  Patient presents with  . Sleep Apnea    History of Present Illness:    Alexander [name] is a 79 y.o. male with a hx of HLD, HTN, MVP with MR and PPM who was referred for sleep study due to snoring by Dr. [name]. He tells me that his wife was complaining the he was snoring a lot.  HIs wife tells me that she has moved to another bedroom due to the snoring.  She says that he has had episodes of witnessed apneas and then gasps for breath in his sleep. He feels rested in the am and has no daytime sleepiness.   He underwent PSG showing very mild OSA with an AHI of 6.78/hr and O2 sats as low as 84%.  His OSA was moderate during REM sleep at 15.4/hr and 10.6/hr during supine sleep.  CPAP titration was recommended but he wanted to discuss results prior to proceeding and is now here to review his sleep study.    Past Medical History:  Diagnosis Date  . Heart murmur   . Hyperlipidemia   . Hypertension   . Mitral regurgitation   . MVP (mitral valve prolapse)   . Presence of permanent cardiac pacemaker   . Snoring 09/07/2019  . Syncope     Past Surgical History:  Procedure Laterality Date  . BUBBLE STUDY  05/23/2019   Procedure: BUBBLE STUDY;  Surgeon: [name], MD;  Location: [place];  Service: Cardiovascular;;  . HERNIA REPAIR    . MITRAL VALVE REPAIR Right 06/14/2019   Procedure: MINIMALLY INVASIVE MITRAL VALVE REPAIR (MVR)<TEE;  Surgeon: [name], MD;  Location: [facility];  Service: Open Heart Surgery;  Laterality: Right;  . PACEMAKER IMPLANT N/A 10/12/2017   Procedure: PACEMAKER IMPLANT;  Surgeon: Constance Haw, MD;  Location: [facility];  Service: Cardiovascular;  Laterality: N/A;  . RIGHT/LEFT HEART CATH AND CORONARY ANGIOGRAPHY N/A  06/06/2019   Procedure: RIGHT/LEFT HEART CATH AND CORONARY ANGIOGRAPHY;  Surgeon: Burnell Blanks, MD;  Location: [facility];  Service: Cardiovascular;  Laterality: N/A;  . TEE WITHOUT CARDIOVERSION N/A 05/23/2019   Procedure: TRANSESOPHAGEAL ECHOCARDIOGRAM (TEE);  Surgeon: [name], MD;  Location: [facility];  Service: Cardiovascular;  Laterality: N/A;  . TEE WITHOUT CARDIOVERSION N/A 06/14/2019   Procedure: TRANSESOPHAGEAL ECHOCARDIOGRAM (TEE);  Surgeon: [name], MD;  Location: [facility];  Service: Open Heart Surgery;  Laterality: N/A;  . TONSILECTOMY, ADENOIDECTOMY, BILATERAL MYRINGOTOMY AND TUBES     as child  . TONSILLECTOMY    . TOTAL KNEE ARTHROPLASTY Left   . TOTAL KNEE ARTHROPLASTY Left 02/23/2019   Procedure: TOTAL KNEE ARTHROPLASTY;  Surgeon: Gaynelle Arabian, MD;  [facility];  Service: Orthopedics;  Laterality: Left;  72min    Current Medications: Current Meds  Medication Sig  . Ascorbic Acid (VITAMIN C) 1000 MG tablet Take 1,000 mg by mouth daily.  Marland Kitchen aspirin EC 81 MG tablet Take 1 tablet (81 mg total) by mouth daily. Swallow whole.  . ezetimibe (ZETIA) 10 MG tablet Take 10 mg by mouth daily.  . Multiple Vitamin (MULTIVITAMIN WITH MINERALS) TABS tablet Take 1 tablet by mouth daily.  Marland Kitchen zolpidem (AMBIEN) 10 MG tablet Take 5-10 mg  by mouth at bedtime as needed.      Allergies:   Patient has no known allergies.   Social History   Socioeconomic History  . Marital status: Married    Spouse name: Not on file  . Number of children: Not on file  . Years of education: Not on file  . Highest education level: Not on file  Occupational History  . Not on file  Tobacco Use  . Smoking status: Never Smoker  . Smokeless tobacco: Never Used  Vaping Use  . Vaping Use: Never used  Substance and Sexual Activity  . Alcohol use: Yes    Alcohol/week: 5.0 standard drinks    Types: 5 Glasses of wine per week  . Drug use: No  . Sexual activity: Not on  file  Other Topics Concern  . Not on file  Social History Narrative  . Not on file   Social Determinants of Health   Financial Resource Strain:   . Difficulty of Paying Living Expenses: Not on file  Food Insecurity:   . Worried About Charity fundraiser in the Last Year: Not on file  . Ran Out of Food in the Last Year: Not on file  Transportation Needs:   . Lack of Transportation (Medical): Not on file  . Lack of Transportation (Non-Medical): Not on file  Physical Activity:   . Days of Exercise per Week: Not on file  . Minutes of Exercise per Session: Not on file  Stress:   . Feeling of Stress : Not on file  Social Connections:   . Frequency of Communication with Friends and Family: Not on file  . Frequency of Social Gatherings with Friends and Family: Not on file  . Attends Religious Services: Not on file  . Active Member of Clubs or Organizations: Not on file  . Attends Archivist Meetings: Not on file  . Marital Status: Not on file     Family History: The patient's family history includes Alzheimer's disease in his mother; Hypertension in his father.  ROS:   Please see the history of present illness.    ROS  All other systems reviewed and negative.   EKGs/Labs/Other Studies Reviewed:    The following studies were reviewed today: Sleep study  EKG:  EKG is not ordered today.    Recent Labs: 02/10/2019: NT-Pro BNP 139; TSH 1.310 06/10/2019: ALT 18 06/15/2019: Magnesium 2.6 06/19/2019: BUN 11; Creatinine, Ser 0.77; Hemoglobin 10.9; Platelets 154; Potassium 3.8; Sodium 140   Recent Lipid Panel    Component Value Date/Time   CHOL 169 02/10/2019 1429   TRIG 80 02/10/2019 1429   HDL 81 02/10/2019 1429   CHOLHDL 2.1 02/10/2019 1429   LDLCALC 73 02/10/2019 1429    Physical Exam:    VS:  BP 112/70   Pulse 86   Ht 5\' 9"  (1.753 m)   Wt 168 lb 3.2 oz (76.3 kg)   SpO2 95%   BMI 24.84 kg/m     Wt Readings from Last 3 Encounters:  12/29/19 168 lb 3.2 oz  (76.3 kg)  11/02/19 169 lb 3.2 oz (76.7 kg)  10/05/19 167 lb 1.7 oz (75.8 kg)     GEN:  Well nourished, well developed in no acute distress HEENT: Normal NECK: No JVD; No carotid bruits LYMPHATICS: No lymphadenopathy CARDIAC: RRR, no murmurs, rubs, gallops RESPIRATORY:  Clear to auscultation without rales, wheezing or rhonchi  ABDOMEN: Soft, non-tender, non-distended MUSCULOSKELETAL:  No edema; No deformity  SKIN: Warm  and dry NEUROLOGIC:  Alert and oriented x 3 PSYCHIATRIC:  Normal affect   ASSESSMENT:    1. OSA (obstructive sleep apnea)   2. Primary hypertension    PLAN:    In order of problems listed above:  1.  OSA -Sleep study showed mild OSA with AHI 6.8/hr but moderate during REM sleep with AHI at 15/hr and during supine sleep 10/hr -since his only symptom is snoring I would like to refer him to ENT to make sure there are no surgical causes for his sleep apnea  -I have asked him to purchase a sleep pillow to help keep him off his back -If nothing noted with ENT eval and still snoring while sleeping on his side, then will refer to Augustina Mood, DDS to evaluate for oral device  2.  HTN -BP controlled -continue low Na diet   Medication Adjustments/Labs and Tests Ordered: Current medicines are reviewed at length with the patient today.  Concerns regarding medicines are outlined above.  No orders of the defined types were placed in this encounter.  No orders of the defined types were placed in this encounter.   Signed, Fransico Him, MD  12/29/2019 8:56 AM    [place]

## 2020-01-10 ENCOUNTER — Telehealth: Payer: Self-pay | Admitting: Cardiology

## 2020-01-10 NOTE — Telephone Encounter (Signed)
Patient following up on ENT referral. States he has still not been contacted. I saw some messages between [facility] about sending over OV notes and Sleep Study results and wasn't sure how much longer patient should wait for a call to schedule. Please advise.

## 2020-01-11 ENCOUNTER — Ambulatory Visit: Payer: Medicare Other | Admitting: Cardiology

## 2020-01-12 ENCOUNTER — Ambulatory Visit: Payer: Medicare Other | Admitting: Cardiology

## 2020-01-19 ENCOUNTER — Ambulatory Visit: Payer: Medicare Other | Admitting: Cardiology

## 2020-02-18 ENCOUNTER — Other Ambulatory Visit: Payer: Self-pay | Admitting: Cardiology

## 2020-02-18 DIAGNOSIS — R55 Syncope and collapse: Secondary | ICD-10-CM

## 2020-02-18 DIAGNOSIS — I4510 Unspecified right bundle-branch block: Secondary | ICD-10-CM

## 2020-02-18 DIAGNOSIS — I341 Nonrheumatic mitral (valve) prolapse: Secondary | ICD-10-CM

## 2020-02-20 DIAGNOSIS — R972 Elevated prostate specific antigen [PSA]: Secondary | ICD-10-CM | POA: Diagnosis not present

## 2020-02-24 DIAGNOSIS — R972 Elevated prostate specific antigen [PSA]: Secondary | ICD-10-CM | POA: Diagnosis not present

## 2020-03-09 ENCOUNTER — Ambulatory Visit (HOSPITAL_COMMUNITY)
Admission: EM | Admit: 2020-03-09 | Discharge: 2020-03-09 | Disposition: A | Discharge status: Home / Self Care | Payer: Medicare Other | Attending: Internal Medicine | Admitting: Internal Medicine

## 2020-03-09 DIAGNOSIS — Z20822 Contact with and (suspected) exposure to covid-19: Secondary | ICD-10-CM | POA: Diagnosis not present

## 2020-03-09 DIAGNOSIS — Z1152 Encounter for screening for COVID-19: Secondary | ICD-10-CM | POA: Diagnosis not present

## 2020-03-09 LAB — SARS CORONAVIRUS 2 (TAT 6-24 HRS): SARS Coronavirus 2: NEGATIVE

## 2020-03-09 NOTE — Discharge Instructions (Signed)

## 2020-03-09 NOTE — ED Triage Notes (Signed)
Pt presents for COVID test for travel. Denies any symptoms at this time.  

## 2020-03-20 ENCOUNTER — Ambulatory Visit (INDEPENDENT_AMBULATORY_CARE_PROVIDER_SITE_OTHER): Payer: Medicare Other

## 2020-03-20 DIAGNOSIS — I495 Sick sinus syndrome: Secondary | ICD-10-CM | POA: Diagnosis not present

## 2020-03-21 LAB — CUP PACEART REMOTE DEVICE CHECK
Battery Remaining Longevity: 142 mo
Battery Voltage: 3.02 V
Brady Statistic AP VP Percent: 0.01 %
Brady Statistic AP VS Percent: 13.8 %
Brady Statistic AS VP Percent: 0.03 %
Brady Statistic AS VS Percent: 86.17 %
Brady Statistic RA Percent Paced: 14.24 %
Brady Statistic RV Percent Paced: 0.03 %
Date Time Interrogation Session: 20220110225054
Implantable Lead Implant Date: 20190805
Implantable Lead Implant Date: 20190805
Implantable Lead Location: 753859
Implantable Lead Location: 753860
Implantable Lead Model: 5076
Implantable Lead Model: 5076
Implantable Pulse Generator Implant Date: 20190805
Lead Channel Impedance Value: 304 Ohm
Lead Channel Impedance Value: 342 Ohm
Lead Channel Impedance Value: 399 Ohm
Lead Channel Impedance Value: 418 Ohm
Lead Channel Pacing Threshold Amplitude: 0.75 V
Lead Channel Pacing Threshold Amplitude: 0.75 V
Lead Channel Pacing Threshold Pulse Width: 0.4 ms
Lead Channel Pacing Threshold Pulse Width: 0.4 ms
Lead Channel Sensing Intrinsic Amplitude: 4.125 mV
Lead Channel Sensing Intrinsic Amplitude: 4.125 mV
Lead Channel Sensing Intrinsic Amplitude: 4.25 mV
Lead Channel Sensing Intrinsic Amplitude: 4.25 mV
Lead Channel Setting Pacing Amplitude: 2 V
Lead Channel Setting Pacing Amplitude: 2.5 V
Lead Channel Setting Pacing Pulse Width: 0.4 ms
Lead Channel Setting Sensing Sensitivity: 1.2 mV

## 2020-03-29 DIAGNOSIS — H6121 Impacted cerumen, right ear: Secondary | ICD-10-CM | POA: Diagnosis not present

## 2020-04-06 NOTE — Progress Notes (Signed)
Remote pacemaker transmission.   

## 2020-04-26 ENCOUNTER — Telehealth: Payer: Self-pay | Admitting: Cardiology

## 2020-04-26 NOTE — Telephone Encounter (Signed)
Pt's wife requesting to speak with Dr. [name] or her nurse in regards to her husband. Did not want me to take a message, states she will have that conversation when she hears back.

## 2020-04-26 NOTE — Telephone Encounter (Signed)
Spoke to wife (ok per pt)  Informed that pt is due for his yearly follow up. Aware I will forward to EP scheduler to follow up with pt and arrnage Please call wife to arrange yearly [place] follow up for his PPM.  [phone])

## 2020-04-30 ENCOUNTER — Ambulatory Visit (HOSPITAL_COMMUNITY): Payer: Medicare Other | Attending: Cardiovascular Disease

## 2020-04-30 ENCOUNTER — Other Ambulatory Visit: Payer: Self-pay

## 2020-04-30 ENCOUNTER — Telehealth: Payer: Self-pay

## 2020-04-30 DIAGNOSIS — Z5181 Encounter for therapeutic drug level monitoring: Secondary | ICD-10-CM | POA: Insufficient documentation

## 2020-04-30 DIAGNOSIS — Z9889 Other specified postprocedural states: Secondary | ICD-10-CM | POA: Diagnosis not present

## 2020-04-30 DIAGNOSIS — I340 Nonrheumatic mitral (valve) insufficiency: Secondary | ICD-10-CM | POA: Insufficient documentation

## 2020-04-30 LAB — ECHOCARDIOGRAM COMPLETE
Area-P 1/2: 2.66 cm2
S' Lateral: 3.5 cm

## 2020-04-30 NOTE — Telephone Encounter (Signed)
-----   Message from Geralynn Rile, MD sent at 04/27/2020  1:03 PM EST ----- Regarding: RE: Wanting referral to new cardiologist Delsa Sale:  Please schedule Alexander [name] (patient) to see me after his echocardiogram that will be performed on 04/30/2020.  He will be changing providers to me and is in need of a 79-month follow-up appointment.  [place] T. Audie Box, MD, [place]  [address], [place], [place] [address] [phone]  1:04 PM  ----- Message ----- From: [name], MD Sent: 04/26/2020   6:58 PM EST To: Geralynn Rile, MD Subject: Melton Alar: Wanting referral to new cardiologist       Here you go.  Thanks Wes.  He's a super nice guy  ----- Message ----- From: Ladon Applebaum, RN Sent: 04/26/2020   5:04 PM EST To: [name], MD Subject: Wanting referral to new cardiologist           Dr. [name],  Mrs. Emond called for herself and her husband, Alexander [name] (s/p Mini MVR 4/21), regarding a new cardiologist now that Dr. [name] is moving. They would like to know who you recommend and/or "who you would like to have as your cardiologist". She also asked if we could put in that referral for them. She said she and Alexander [name] are very thankful for all you've done for Alexander [name] and he has been doing great since surgery.   Thanks, [employer]

## 2020-04-30 NOTE — Telephone Encounter (Signed)
Called patient to have him seen, LVM to call back to get scheduled.  Left call back number.

## 2020-05-01 NOTE — Telephone Encounter (Signed)
    Pt is returning call, he said he needs to discuss with his wife first who he wants new cards for him.

## 2020-05-01 NOTE — Telephone Encounter (Signed)
Spoke to patient he stated he has not decided on a new cardiologist yet.He will call back and schedule appointment when he decides.

## 2020-05-07 ENCOUNTER — Ambulatory Visit: Payer: Medicare Other | Admitting: Cardiology

## 2020-05-17 ENCOUNTER — Ambulatory Visit (INDEPENDENT_AMBULATORY_CARE_PROVIDER_SITE_OTHER): Payer: Medicare Other | Admitting: Cardiology

## 2020-05-17 ENCOUNTER — Other Ambulatory Visit: Payer: Self-pay

## 2020-05-17 ENCOUNTER — Encounter: Payer: Self-pay | Admitting: Cardiology

## 2020-05-17 VITALS — BP 122/72 | HR 76 | Ht 69.0 in | Wt 175.2 lb

## 2020-05-17 DIAGNOSIS — I480 Paroxysmal atrial fibrillation: Secondary | ICD-10-CM | POA: Diagnosis not present

## 2020-05-17 DIAGNOSIS — Z952 Presence of prosthetic heart valve: Secondary | ICD-10-CM

## 2020-05-17 DIAGNOSIS — Z9889 Other specified postprocedural states: Secondary | ICD-10-CM | POA: Diagnosis not present

## 2020-05-17 DIAGNOSIS — I340 Nonrheumatic mitral (valve) insufficiency: Secondary | ICD-10-CM | POA: Diagnosis not present

## 2020-05-17 DIAGNOSIS — E7849 Other hyperlipidemia: Secondary | ICD-10-CM

## 2020-05-17 NOTE — Patient Instructions (Signed)
Medication Instructions:   Your physician recommends that you continue on your current medications as directed. Please refer to the Current Medication list given to you today.  *If you need a refill on your cardiac medications before your next appointment, please call your pharmacy*   Follow-Up: At [facility], you and your health needs are our priority.  As part of our continuing mission to provide you with exceptional heart care, we have created designated Provider Care Teams.  These Care Teams include your primary Cardiologist (physician) and Advanced Practice Providers (APPs -  Physician Assistants and Nurse Practitioners) who all work together to provide you with the care you need, when you need it.  We recommend signing up for the patient portal called "MyChart".  Sign up information is provided on this After Visit Summary.  MyChart is used to connect with patients for Virtual Visits (Telemedicine).  Patients are able to view lab/test results, encounter notes, upcoming appointments, etc.  Non-urgent messages can be sent to your provider as well.   To learn more about what you can do with MyChart, go to [email or web address].    Your next appointment:   6 month(s)  The format for your next appointment:   In Person  Provider:   Heather Pemberton, MD     

## 2020-05-17 NOTE — Progress Notes (Signed)
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  Patient underwent mitral valve repair via right minithoracotomy 06/14/2019 by Dr.Owen.  He developed postop atrial fibrillation started on amiodarone and converted to normal sinus rhythm.  Preop cath no evidence of CAD.  Normal right and left heart pressures.  He has been doing great early post surgery, walking several miles a day playing golf almost on a daily basis. His Coumadin was discontinued after 3 months, currently on aspirin only.  The patient is doing exceptionally well, he exercises every day including using treadmill, lifting weights and playing golf, he literally has no limitations to his physical activities.  They also have been traveling a lot.  He denies any palpitation dizziness or syncope and no chest pain.    Past Medical History:  Diagnosis Date  . Heart murmur   . Hyperlipidemia   . Hypertension   . Mitral regurgitation   . MVP (mitral valve prolapse)   . Presence of permanent cardiac pacemaker   . Snoring 09/07/2019  . Syncope     Past Surgical History:  Procedure Laterality Date  . BUBBLE STUDY  05/23/2019   Procedure: BUBBLE STUDY;  Surgeon: [name], MD;  Location: [place];  Service: Cardiovascular;;  . HERNIA REPAIR    . MITRAL VALVE REPAIR Right 06/14/2019   Procedure: MINIMALLY INVASIVE MITRAL VALVE REPAIR (MVR)<TEE;  Surgeon: [name], MD;  Location: [place];  Service: Open Heart Surgery;  Laterality: Right;  . PACEMAKER IMPLANT N/A 10/12/2017   Procedure: PACEMAKER  IMPLANT;  Surgeon: Constance Haw, MD;  Location: [place] [facility];  Service: Cardiovascular;  Laterality: N/A;  . RIGHT/LEFT HEART CATH AND CORONARY ANGIOGRAPHY N/A 06/06/2019   Procedure: RIGHT/LEFT HEART CATH AND CORONARY ANGIOGRAPHY;  Surgeon: Burnell Blanks, MD;  Location: [place] [facility];  Service: Cardiovascular;  Laterality: N/A;  . TEE WITHOUT CARDIOVERSION N/A 05/23/2019   Procedure: TRANSESOPHAGEAL ECHOCARDIOGRAM (TEE);  Surgeon: [name], MD;  Location: [place];  Service: Cardiovascular;  Laterality: N/A;  . TEE WITHOUT CARDIOVERSION N/A 06/14/2019   Procedure: TRANSESOPHAGEAL ECHOCARDIOGRAM (TEE);  Surgeon: [name], MD;  Location: [place];  Service: Open Heart Surgery;  Laterality: N/A;  . TONSILECTOMY, ADENOIDECTOMY, BILATERAL MYRINGOTOMY AND TUBES     as child  . TONSILLECTOMY    . TOTAL KNEE ARTHROPLASTY Left   . TOTAL KNEE ARTHROPLASTY Left 02/23/2019   Procedure: TOTAL KNEE ARTHROPLASTY;  Surgeon: Gaynelle Arabian, MD;  [facility];  Service: Orthopedics;  Laterality: Left;  67min    Current Medications: Current Meds  Medication Sig  . Ascorbic Acid (VITAMIN C) 1000 MG tablet Take 1,000 mg by mouth daily.  Marland Kitchen aspirin EC 81 MG tablet Take 1 tablet (81 mg total) by mouth daily. Swallow whole.  . ezetimibe (ZETIA) 10 MG tablet Take 10 mg by mouth daily.  Marland Kitchen zolpidem (AMBIEN) 10 MG tablet Take 5-10 mg by mouth at bedtime as needed.      Allergies:   Patient has no known allergies.   Social History  Socioeconomic History  . Marital status: Married    Spouse name: Not on file  . Number of children: Not on file  . Years of education: Not on file  . Highest education level: Not on file  Occupational History  . Not on file  Tobacco Use  . Smoking status: Never Smoker  . Smokeless tobacco: Never Used  Vaping Use  . Vaping Use: Never used  Substance and Sexual Activity  . Alcohol use: Yes    Alcohol/week: 5.0 standard drinks     Types: 5 Glasses of wine per week  . Drug use: No  . Sexual activity: Not on file  Other Topics Concern  . Not on file  Social History Narrative  . Not on file   Social Determinants of Health   Financial Resource Strain: Not on file  Food Insecurity: Not on file  Transportation Needs: Not on file  Physical Activity: Not on file  Stress: Not on file  Social Connections: Not on file     Family History:  The patient's   family history includes Alzheimer's disease in his mother; Hypertension in his father.   ROS:   Please see the history of present illness.    ROS All other systems reviewed and are negative.   PHYSICAL EXAM:   VS:  BP 122/72   Pulse 76   Ht 5\' 9"  (1.753 m)   Wt 175 lb 3.2 oz (79.5 kg)   SpO2 95%   BMI 25.87 kg/m   Physical Exam  ZSW:FUXN, in no acute distress  Neck: no JVD, carotid bruits, or masses Cardiac:RRR; minimal   Respiratory:  clear to auscultation bilaterally, normal work of breathing GI: soft, nontender, nondistended, + BS Ext: without cyanosis, clubbing, or edema, Good distal pulses bilaterally MS: no deformity or atrophy  Skin: warm and dry, no rash Neuro:  Alert and Oriented x 3 Psych: euthymic mood, full affect  Wt Readings from Last 3 Encounters:  05/17/20 175 lb 3.2 oz (79.5 kg)  12/29/19 168 lb 3.2 oz (76.3 kg)  11/02/19 169 lb 3.2 oz (76.7 kg)    Studies/Labs Reviewed:   EKG:  EKG is ordered today.  It shows sinus rhythm with right bundle branch block that is unchanged from prior.  This was personally reviewed.  Recent Labs: 06/10/2019: ALT 18 06/15/2019: Magnesium 2.6 06/19/2019: BUN 11; Creatinine, Ser 0.77; Hemoglobin 10.9; Platelets 154; Potassium 3.8; Sodium 140   Lipid Panel    Component Value Date/Time   CHOL 169 02/10/2019 1429   TRIG 80 02/10/2019 1429   HDL 81 02/10/2019 1429   CHOLHDL 2.1 02/10/2019 1429   LDLCALC 73 02/10/2019 1429    Additional studies/ records that were reviewed today include:          CARDIOTHORACIC SURGERY OPERATIVE NOTE   Date of Procedure:                06/14/2019   Preoperative Diagnosis:      Severe Mitral Regurgitation  Minimally-Invasive Mitral Valve Repair             Complex valvuloplasty including artificial Gore-tex neochord placement x12             Sorin Memo 4D Ring Annuloplasty (size 5mm, catalog # [id number], serial # [id number])           Surgeon:        Valentina Gu. [name], MD Operative Findings: ? Barlow's type myxomatous degenerative disease ? Bileaflet prolapse with multiple elongated chordae  tendinae ? Type II dysfunction with severe mitral regurgitation ? Normal left ventricular systolic function ? No residual mitral regurgitation after successful valve repair  Cardiac catheterization 06/06/2019 1. No angiographic evidence of CAD 2. Normal right and left sided pressures   Continue with planning for mitral valve surgery.   TTE: 04/30/2020  1. The mitral valve has been repaired/replaced. No evidence of mitral  valve regurgitation. No evidence of mitral stenosis. The mean mitral valve  gradient is 2.0 mmHg with average heart rate of 68 bpm. There is a 40 mm  prosthetic annuloplasty ring  present in the mitral position. Procedure Date: 06/14/2019.  2. Left ventricular ejection fraction, by estimation, is 50 to 55%. The  left ventricle has low normal function. The left ventricle has no regional  wall motion abnormalities. Left ventricular diastolic function could not  be evaluated.  3. Right ventricular systolic function is normal. The right ventricular  size is normal. There is normal pulmonary artery systolic pressure. The  estimated right ventricular systolic pressure is 27.5 mmHg.  4. The aortic valve is tricuspid. Aortic valve regurgitation is not  visualized. No aortic stenosis is present.  5. The inferior vena cava is normal in size with greater than 50%  respiratory variability, suggesting right atrial pressure of 3 mmHg.    ASSESSMENT:     1. S/P minimally invasive mitral valve repair   2. Other hyperlipidemia   3. 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   His most recent echocardiogram on 04/30/2020 shows normal mean gradient of 2 mmHg, there is no mitral regurgitation.  The EF is 50 to 55%.  Postop atrial fibrillation - He remains in sinus rhythm off amiodarone.  We will continue aspirin 81 mg daily.  Status post pacemaker for sinus arrest -functioning well, followed by Dr. Curt Bears  Hyperlipidemia Crestor stopped because of memory impairment calcium score was 94 which was 27th percentile but cardiac cath showed minimal CAD, will continue Zetia as a lipid lowering agent.  Obstructive sleep apnea -mild based on sleep study, he was recommended to use special pillow but no need for CPAP machine..  Medication Adjustments/Labs and Tests Ordered: Current medicines are reviewed at length with the patient today.  Concerns regarding medicines are outlined above.  Medication changes, Labs and Tests ordered today are listed in the Patient Instructions below. Patient Instructions  Medication Instructions:   Your physician recommends that you continue on your current medications as directed. Please refer to the Current Medication list given to you today.  *If you need a refill on your cardiac medications before your next appointment, please call your pharmacy*   Follow-Up: At [facility], you and your health needs are our priority.  As part of our continuing mission to provide you with exceptional heart care, we have created designated Provider Care Teams.  These Care Teams include your primary Cardiologist (physician) and Advanced Practice Providers (APPs -  Physician Assistants and Nurse  Practitioners) who all work together to provide you with the care you need, when you need it.  We recommend signing up for the patient portal called "MyChart".  Sign up information is provided on this After Visit Summary.  MyChart is used to connect with patients for Virtual Visits (Telemedicine).  Patients are able to  view lab/test results, encounter notes, upcoming appointments, etc.  Non-urgent messages can be sent to your provider as well.   To learn more about what you can do with MyChart, go to [email or web address].    Your next appointment:   6 month(s)  The format for your next appointment:   In Person  Provider:   Gwyndolyn Kaufman, MD        Signed, [name], MD  05/17/2020 12:30 PM    [address] [place], [place], [place]  [address] Phone: [phone]; Fax: [phone]

## 2020-06-12 ENCOUNTER — Ambulatory Visit: Payer: Medicare Other | Attending: Internal Medicine

## 2020-06-12 ENCOUNTER — Other Ambulatory Visit: Payer: Self-pay

## 2020-06-12 DIAGNOSIS — Z23 Encounter for immunization: Secondary | ICD-10-CM

## 2020-06-12 NOTE — Progress Notes (Signed)
   Covid-19 Vaccination Clinic  Name:  Alexander [name]    MRN: [id number] DOB: 08-28-1940  06/12/2020  [name] was observed post Covid-19 immunization for 15 minutes without incident. He was provided with Vaccine Information Sheet and instruction to access the V-Safe system.   [name] was instructed to call 911 with any severe reactions post vaccine: Marland Kitchen Difficulty breathing  . Swelling of face and throat  . A fast heartbeat  . A bad rash all over body  . Dizziness and weakness   Immunizations Administered    Name Date Dose VIS Date Route   PFIZER Comrnaty(Gray TOP) Covid-19 Vaccine 06/12/2020  1:11 PM 0.3 mL 02/16/2020 Intramuscular   Manufacturer: [place]   Lot: [id number]   [place]: [phone]

## 2020-06-15 ENCOUNTER — Other Ambulatory Visit (HOSPITAL_BASED_OUTPATIENT_CLINIC_OR_DEPARTMENT_OTHER): Payer: Self-pay

## 2020-06-18 ENCOUNTER — Encounter: Payer: Medicare Other | Admitting: Cardiology

## 2020-06-19 ENCOUNTER — Ambulatory Visit (INDEPENDENT_AMBULATORY_CARE_PROVIDER_SITE_OTHER): Payer: Medicare Other

## 2020-06-19 ENCOUNTER — Other Ambulatory Visit: Payer: Self-pay

## 2020-06-19 ENCOUNTER — Encounter: Payer: Self-pay | Admitting: Cardiology

## 2020-06-19 ENCOUNTER — Ambulatory Visit (INDEPENDENT_AMBULATORY_CARE_PROVIDER_SITE_OTHER): Payer: Medicare Other | Admitting: Cardiology

## 2020-06-19 VITALS — BP 122/68 | HR 96 | Ht 69.0 in | Wt 173.0 lb

## 2020-06-19 DIAGNOSIS — I495 Sick sinus syndrome: Secondary | ICD-10-CM | POA: Diagnosis not present

## 2020-06-19 DIAGNOSIS — I455 Other specified heart block: Secondary | ICD-10-CM

## 2020-06-19 LAB — CUP PACEART REMOTE DEVICE CHECK
Battery Remaining Longevity: 139 mo
Battery Voltage: 3.02 V
Brady Statistic AP VP Percent: 0.01 %
Brady Statistic AP VS Percent: 15.21 %
Brady Statistic AS VP Percent: 0.03 %
Brady Statistic AS VS Percent: 84.75 %
Brady Statistic RA Percent Paced: 15.69 %
Brady Statistic RV Percent Paced: 0.04 %
Date Time Interrogation Session: 20220412070230
Implantable Lead Implant Date: 20190805
Implantable Lead Implant Date: 20190805
Implantable Lead Location: 753859
Implantable Lead Location: 753860
Implantable Lead Model: 5076
Implantable Lead Model: 5076
Implantable Pulse Generator Implant Date: 20190805
Lead Channel Impedance Value: 304 Ohm
Lead Channel Impedance Value: 323 Ohm
Lead Channel Impedance Value: 380 Ohm
Lead Channel Impedance Value: 399 Ohm
Lead Channel Pacing Threshold Amplitude: 0.625 V
Lead Channel Pacing Threshold Amplitude: 0.75 V
Lead Channel Pacing Threshold Pulse Width: 0.4 ms
Lead Channel Pacing Threshold Pulse Width: 0.4 ms
Lead Channel Sensing Intrinsic Amplitude: 3.875 mV
Lead Channel Sensing Intrinsic Amplitude: 3.875 mV
Lead Channel Sensing Intrinsic Amplitude: 5.125 mV
Lead Channel Sensing Intrinsic Amplitude: 5.125 mV
Lead Channel Setting Pacing Amplitude: 2 V
Lead Channel Setting Pacing Amplitude: 2.5 V
Lead Channel Setting Pacing Pulse Width: 0.4 ms
Lead Channel Setting Sensing Sensitivity: 1.2 mV

## 2020-06-19 NOTE — Progress Notes (Signed)
Electrophysiology Office Note   Date:  06/19/2020   ID:  Morgen, Ritacco 1940/08/28, MRN [id number]  PCP:  [name], MD  Cardiologist:  [name]  Primary Electrophysiologist:  Aniella Wandrey Meredith Leeds, MD    No chief complaint on file.    History of Present Illness: Alexander [name] is a 80 y.o. male who is being seen today for the evaluation of sinus arrest at the request of [name], MD. Presenting today for electrophysiology evaluation.    He has a history of hypertension, hyperlipidemia, mitral valve regurgitation status post annuloplasty ring 06/14/2019, and syncope.  His mid to the hospital after an episode of syncope and was noted of sinus arrest.  He is status post Medtronic dual-chamber pacemaker implanted 10/12/2017.  After his surgery, he developed postoperative atrial fibrillation and was started on amiodarone.  He converted to sinus rhythm.  Today, denies symptoms of palpitations, chest pain, shortness of breath, orthopnea, PND, lower extremity edema, claudication, dizziness, presyncope, syncope, bleeding, or neurologic sequela. The patient is tolerating medications without difficulties.  He is doing well.  He has no chest pain or shortness of breath.  Is able do all of his daily activities.  He exercises on a daily basis.  He plays golf multiple times a week.  He has had no further episodes of syncope.   Past Medical History:  Diagnosis Date  . Heart murmur   . Hyperlipidemia   . Hypertension   . Mitral regurgitation   . MVP (mitral valve prolapse)   . Presence of permanent cardiac pacemaker   . Snoring 09/07/2019  . Syncope    Past Surgical History:  Procedure Laterality Date  . BUBBLE STUDY  05/23/2019   Procedure: BUBBLE STUDY;  Surgeon: [name], MD;  Location: [facility] [place] [facility] [place];  Service: Cardiovascular;;  . HERNIA REPAIR    . MITRAL VALVE REPAIR Right 06/14/2019   Procedure: MINIMALLY INVASIVE MITRAL VALVE REPAIR (MVR)<TEE;  Surgeon: [name], MD;  Location: [place];  Service: Open Heart Surgery;  Laterality: Right;  . PACEMAKER IMPLANT N/A 10/12/2017   Procedure: PACEMAKER IMPLANT;  Surgeon: Constance Haw, MD;  Location: [place] [facility];  Service: Cardiovascular;  Laterality: N/A;  . RIGHT/LEFT HEART CATH AND CORONARY ANGIOGRAPHY N/A 06/06/2019   Procedure: RIGHT/LEFT HEART CATH AND CORONARY ANGIOGRAPHY;  Surgeon: Burnell Blanks, MD;  Location: [place] [facility];  Service: Cardiovascular;  Laterality: N/A;  . TEE WITHOUT CARDIOVERSION N/A 05/23/2019   Procedure: TRANSESOPHAGEAL ECHOCARDIOGRAM (TEE);  Surgeon: [name], MD;  Location: [place];  Service: Cardiovascular;  Laterality: N/A;  . TEE WITHOUT CARDIOVERSION N/A 06/14/2019   Procedure: TRANSESOPHAGEAL ECHOCARDIOGRAM (TEE);  Surgeon: [name], MD;  Location: [place] [facility];  Service: Open Heart Surgery;  Laterality: N/A;  . TONSILECTOMY, ADENOIDECTOMY, BILATERAL MYRINGOTOMY AND TUBES     as child  . TONSILLECTOMY    . TOTAL KNEE ARTHROPLASTY Left   . TOTAL KNEE ARTHROPLASTY Left 02/23/2019   Procedure: TOTAL KNEE ARTHROPLASTY;  Surgeon: Gaynelle Arabian, MD;  [facility];  Service: Orthopedics;  Laterality: Left;  6min     Current Outpatient Medications  Medication Sig Dispense Refill  . Ascorbic Acid (VITAMIN C) 1000 MG tablet Take 1,000 mg by mouth daily.    Marland Kitchen aspirin EC 81 MG tablet Take 1 tablet (81 mg total) by mouth daily. Swallow whole. 90 tablet 3  . ezetimibe (ZETIA) 10 MG tablet Take 10 mg by mouth daily.    Marland Kitchen  zolpidem (AMBIEN) 10 MG tablet Take 5-10 mg by mouth at bedtime as needed.      No current facility-administered medications for this visit.    Allergies:   Patient has no known allergies.   Social History:  The patient  reports that he has never smoked. He has never used smokeless tobacco. He reports current alcohol use of about 5.0 standard drinks of alcohol per week. He reports that he does not use drugs.    Family History:  The patient's family history includes Alzheimer's disease in his mother; Hypertension in his father.    ROS:  Please see the history of present illness.   Otherwise, review of systems is positive for none.   All other systems are reviewed and negative.   PHYSICAL EXAM: VS:  BP 122/68   Pulse 96   Ht 5\' 9"  (1.753 m)   Wt 173 lb (78.5 kg)   SpO2 99%   BMI 25.55 kg/m  , BMI Body mass index is 25.55 kg/m. GEN: Well nourished, well developed, in no acute distress  HEENT: normal  Neck: no JVD, carotid bruits, or masses Cardiac: RRR; no murmurs, rubs, or gallops,no edema  Respiratory:  clear to auscultation bilaterally, normal work of breathing GI: soft, nontender, nondistended, + BS MS: no deformity or atrophy  Skin: warm and dry, device site well healed Neuro:  Strength and sensation are intact Psych: euthymic mood, full affect  EKG:  EKG is not ordered today. Personal review of the ekg ordered 05/17/20 shows sinus rhythm, right bundle branch block  Personal review of the device interrogation today. Results in [place]: No results found for requested labs within last 8760 hours.    Lipid Panel     Component Value Date/Time   CHOL 169 02/10/2019 1429   TRIG 80 02/10/2019 1429   HDL 81 02/10/2019 1429   CHOLHDL 2.1 02/10/2019 1429   LDLCALC 73 02/10/2019 1429     Wt Readings from Last 3 Encounters:  06/19/20 173 lb (78.5 kg)  05/17/20 175 lb 3.2 oz (79.5 kg)  12/29/19 168 lb 3.2 oz (76.3 kg)      Other studies Reviewed: Additional studies/ records that were reviewed today include: TTE 03/14/2019 Review of the above records today demonstrates:   1. Left ventricular ejection fraction, by visual estimation, is 55 to 60%. There is no increased left ventricular wall thickness.  2. Left ventricular diastolic parameters are consistent with Grade I diastolic dysfunction (impaired relaxation).  3. Mild to moderately dilated left ventricular  internal cavity size.  4. Global right ventricle has normal systolc function.The right ventricular size is normal. no increase in right ventricular wall thickness.  5. Moderate mitral valve prolapse.  6. Moderate thickening of the mitral valve leaflet(s).  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  8. Normal pulmonary artery systolic pressure.  9. The inferior vena cava is normal in size with greater than 50% respiratory variability, suggesting right atrial pressure of 3 mmHg. 10. A prior study was performed on 02/10/2019. 11. No significant change from prior study. 12. Prior echo: LVEF 55-60%, moderate MR with MVP.   ASSESSMENT AND PLAN:  1.  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  Device functioning appropriately.  No changes.    2.  Hypertension: Currently well controlled.  He is not on any medications.  I am now questioning his diagnosis of hypertension.  3.  Hyperlipidemia: Continue Crestor  4.  Mitral  regurgitation: Stable on most recent echo.  Status post prosthetic annuloplasty ring.  Plan per primary cardiology.  5.  Postoperative atrial fibrillation: Has had no further episodes.  Not on anticoagulation or antiarrhythmics.  Current medicines are reviewed at length with the patient today.   The patient does not have concerns regarding his medicines.  The following changes were made today: None  Labs/ tests ordered today include:  No orders of the defined types were placed in this encounter.    Disposition:   FU with Jahfari Ambers 12 months  Signed, Lindaann Gradilla Meredith Leeds, MD  06/19/2020 8:38 AM     [address] [place] [address] [phone] (office) [phone] (fax)

## 2020-06-19 NOTE — Patient Instructions (Signed)
Medication Instructions:  Your physician recommends that you continue on your current medications as directed. Please refer to the Current Medication list given to you today.  *If you need a refill on your cardiac medications before your next appointment, please call your pharmacy*   Lab Work: None ordered  Testing/Procedures: None ordered   Follow-Up: At [facility], you and your health needs are our priority.  As part of our continuing mission to provide you with exceptional heart care, we have created designated Provider Care Teams.  These Care Teams include your primary Cardiologist (physician) and Advanced Practice Providers (APPs -  Physician Assistants and Nurse Practitioners) who all work together to provide you with the care you need, when you need it.  Remote monitoring is used to monitor your Pacemaker or ICD from home. This monitoring reduces the number of office visits required to check your device to one time per year. It allows [place] to keep an eye on the functioning of your device to ensure it is working properly. You are scheduled for a device check from home on 09/18/2020. You may send your transmission at any time that day. If you have a wireless device, the transmission will be sent automatically. After your physician reviews your transmission, you will receive a postcard with your next transmission date.  Your next appointment:   1 year(s)  The format for your next appointment:   In Person  Provider:   Allegra Lai, MD   Thank you for choosing [facility]!!   Trinidad Curet, RN [phone]

## 2020-06-21 ENCOUNTER — Other Ambulatory Visit (HOSPITAL_BASED_OUTPATIENT_CLINIC_OR_DEPARTMENT_OTHER): Payer: Self-pay

## 2020-06-21 MED ORDER — COVID-19 MRNA VACCINE (PFIZER) 30 MCG/0.3ML IM SUSP
INTRAMUSCULAR | 0 refills | Status: DC
  Filled 2020-06-21: qty 0.3, 1d supply, fill #0

## 2020-07-02 ENCOUNTER — Ambulatory Visit: Payer: Medicare Other | Admitting: Thoracic Surgery (Cardiothoracic Vascular Surgery)

## 2020-07-04 NOTE — Progress Notes (Signed)
Remote pacemaker transmission.   

## 2020-07-12 DIAGNOSIS — Z125 Encounter for screening for malignant neoplasm of prostate: Secondary | ICD-10-CM | POA: Diagnosis not present

## 2020-07-12 DIAGNOSIS — M859 Disorder of bone density and structure, unspecified: Secondary | ICD-10-CM | POA: Diagnosis not present

## 2020-07-12 DIAGNOSIS — E785 Hyperlipidemia, unspecified: Secondary | ICD-10-CM | POA: Diagnosis not present

## 2020-07-13 DIAGNOSIS — I10 Essential (primary) hypertension: Secondary | ICD-10-CM | POA: Diagnosis not present

## 2020-07-13 DIAGNOSIS — Z1212 Encounter for screening for malignant neoplasm of rectum: Secondary | ICD-10-CM | POA: Diagnosis not present

## 2020-07-13 DIAGNOSIS — R82998 Other abnormal findings in urine: Secondary | ICD-10-CM | POA: Diagnosis not present

## 2020-07-19 DIAGNOSIS — I700 Atherosclerosis of aorta: Secondary | ICD-10-CM | POA: Diagnosis not present

## 2020-07-19 DIAGNOSIS — Z950 Presence of cardiac pacemaker: Secondary | ICD-10-CM | POA: Diagnosis not present

## 2020-07-19 DIAGNOSIS — Z0000 Encounter for general adult medical examination without abnormal findings: Secondary | ICD-10-CM | POA: Diagnosis not present

## 2020-07-19 DIAGNOSIS — I77810 Thoracic aortic ectasia: Secondary | ICD-10-CM | POA: Diagnosis not present

## 2020-07-19 DIAGNOSIS — I341 Nonrheumatic mitral (valve) prolapse: Secondary | ICD-10-CM | POA: Diagnosis not present

## 2020-07-19 DIAGNOSIS — Z1389 Encounter for screening for other disorder: Secondary | ICD-10-CM | POA: Diagnosis not present

## 2020-07-19 DIAGNOSIS — I4510 Unspecified right bundle-branch block: Secondary | ICD-10-CM | POA: Diagnosis not present

## 2020-07-19 DIAGNOSIS — I2510 Atherosclerotic heart disease of native coronary artery without angina pectoris: Secondary | ICD-10-CM | POA: Diagnosis not present

## 2020-07-19 DIAGNOSIS — E785 Hyperlipidemia, unspecified: Secondary | ICD-10-CM | POA: Diagnosis not present

## 2020-07-19 DIAGNOSIS — I480 Paroxysmal atrial fibrillation: Secondary | ICD-10-CM | POA: Diagnosis not present

## 2020-07-19 DIAGNOSIS — Z23 Encounter for immunization: Secondary | ICD-10-CM | POA: Diagnosis not present

## 2020-07-19 DIAGNOSIS — R972 Elevated prostate specific antigen [PSA]: Secondary | ICD-10-CM | POA: Diagnosis not present

## 2020-07-23 ENCOUNTER — Ambulatory Visit (INDEPENDENT_AMBULATORY_CARE_PROVIDER_SITE_OTHER): Payer: Medicare Other | Admitting: Thoracic Surgery (Cardiothoracic Vascular Surgery)

## 2020-07-23 ENCOUNTER — Encounter: Payer: Self-pay | Admitting: Thoracic Surgery (Cardiothoracic Vascular Surgery)

## 2020-07-23 ENCOUNTER — Other Ambulatory Visit: Payer: Self-pay

## 2020-07-23 VITALS — BP 131/74 | HR 80 | Temp 97.7°F | Resp 20 | Wt 168.0 lb

## 2020-07-23 DIAGNOSIS — Z952 Presence of prosthetic heart valve: Secondary | ICD-10-CM | POA: Diagnosis not present

## 2020-07-23 DIAGNOSIS — I340 Nonrheumatic mitral (valve) insufficiency: Secondary | ICD-10-CM | POA: Diagnosis not present

## 2020-07-23 DIAGNOSIS — Z9889 Other specified postprocedural states: Secondary | ICD-10-CM | POA: Diagnosis not present

## 2020-07-23 NOTE — Patient Instructions (Signed)

## 2020-07-23 NOTE — Progress Notes (Signed)
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  He was last seen here in our office on September 26, 2019 at which time he was doing well.  More recently he was seen in follow-up with Alexander [name] on May 17, 2020, and routine follow-up echocardiogram performed at that time revealed intact mitral valve repair with trivial mitral regurgitation and low normal left ventricular systolic function.  Patient returns to our office today and reports that he is doing exceptionally well.  He is quite active physically and he reports no limitations whatsoever.  He specifically denies any exertional shortness of breath or chest discomfort.  Overall he is delighted with his result.   Current Outpatient Medications  Medication Sig Dispense Refill  . Ascorbic Acid (VITAMIN C) 1000 MG tablet Take 1,000 mg by mouth daily.    Marland Kitchen aspirin EC 81 MG tablet Take 1 tablet (81 mg total) by mouth daily. Swallow whole. 90 tablet 3  . COVID-19 mRNA vaccine, Pfizer, 30 MCG/0.3ML injection Inject into the muscle. 0.3 mL 0  . ezetimibe (ZETIA) 10 MG tablet Take 10 mg by mouth daily.    Marland Kitchen zolpidem (AMBIEN) 10 MG tablet Take 5-10 mg by mouth at bedtime as needed.      No current facility-administered medications for this visit.      Physical Exam:   There were no vitals taken for this visit.  General:  Well-appearing  Chest:   Clear to  auscultation  CV:   Regular rate and rhythm without murmur  Incisions:  Completely healed  Abdomen:  Soft nontender  Extremities:  Warm and well-perfused  Diagnostic Tests:   ECHOCARDIOGRAM REPORT       Patient Name:  Alexander [name] Date of Exam: 04/30/2020  Medical Rec #: [id number]       Height:    69.0 in  Accession #:  [id number]       Weight:    168.2 lb  Date of Birth: May 02, 1940       BSA:     1.919 m  Patient Age:  70 years        BP:      110/80 mmHg  Patient Gender: M           HR:      68 bpm.  Exam Location: Alexander [name]   Procedure: 2D Echo, Cardiac Doppler and Color Doppler   Indications:  Z98.890 Mitral valve repair    History:    Patient has prior history of Echocardiogram examinations.         Pacemaker, S/p MV repair (40Sorin Memo ring),  Arrythmias:RBBB;         Risk Factors:Hypertension and Dyslipidemia.           Mitral Valve: 40 mm prosthetic annuloplasty ring valve is         present in the mitral position. Procedure Date: 06/14/2019.  Sonographer:  Alexander [name] RDCS  Referring Phys: [id number] [place]    1. The mitral valve has been repaired/replaced. No evidence of mitral  valve regurgitation. No evidence of mitral stenosis. The mean mitral valve  gradient is 2.0 mmHg with average heart rate of 68 bpm. There is a 40 mm  prosthetic annuloplasty ring  present in the mitral position. Procedure Date: 06/14/2019.  2. Left ventricular ejection fraction, by estimation, is 50 to 55%. The  left ventricle has low normal function. The left ventricle has no regional  wall motion abnormalities. Left ventricular diastolic function could not  be evaluated.  3. Right ventricular systolic function is normal. The right ventricular  size is normal. There is normal pulmonary artery systolic pressure. The  estimated  right ventricular systolic pressure is 69.6 mmHg.  4. The aortic valve is tricuspid. Aortic valve regurgitation is not  visualized. No aortic stenosis is present.  5. The inferior vena cava is normal in size with greater than 50%  respiratory variability, suggesting right atrial pressure of 3 mmHg.   Comparison(s): No significant change from prior study.   FINDINGS  Left Ventricle: Left ventricular ejection fraction, by estimation, is 50  to 55%. The left ventricle has low normal function. The left ventricle has  no regional wall motion abnormalities. The left ventricular internal  cavity size was normal in size.  There is no left ventricular hypertrophy. Abnormal (paradoxical) septal  motion consistent with post-operative status. Left ventricular diastolic  function could not be evaluated due to mitral valve repair. Left  ventricular diastolic function could not be  evaluated.   Right Ventricle: The right ventricular size is normal. No increase in  right ventricular wall thickness. Right ventricular systolic function is  normal. There is normal pulmonary artery systolic pressure. The tricuspid  regurgitant velocity is 2.19 m/s, and  with an assumed right atrial pressure of 3 mmHg, the estimated right  ventricular systolic pressure is 29.5 mmHg.   Left Atrium: Left atrial size was normal in size.   Right Atrium: Right atrial size was normal in size.   Pericardium: Trivial pericardial effusion is present.   Mitral Valve: The mitral valve has been repaired/replaced. No evidence of  mitral valve regurgitation. There is a 40 mm prosthetic annuloplasty ring  present in the mitral position. Procedure Date: 06/14/2019. No evidence of  mitral valve stenosis. The mean  mitral valve gradient is 2.0 mmHg with average heart rate of 68 bpm.   Tricuspid Valve: The tricuspid valve is grossly normal. Tricuspid valve  regurgitation is trivial. No evidence of tricuspid stenosis.   Aortic  Valve: The aortic valve is tricuspid. Aortic valve regurgitation is  not visualized. No aortic stenosis is present.   Pulmonic Valve: The pulmonic valve was grossly normal. Pulmonic valve  regurgitation is not visualized. No evidence of pulmonic stenosis.   Aorta: The aortic root and ascending aorta are structurally normal, with  no evidence of dilitation.   Venous: The inferior vena cava is normal in size with greater than 50%  respiratory variability, suggesting right atrial pressure of 3 mmHg.   IAS/Shunts: The atrial septum is grossly normal.   Additional Comments: A pacer wire is visualized in the right atrium and  right ventricle.     LEFT VENTRICLE  PLAX 2D  LVIDd:     4.80 cm Diastology  LVIDs:     3.50 cm LV e' medial:  4.35 cm/s  LV PW:  1.30 cm LV E/e' medial: 18.3  LV IVS:    1.00 cm LV e' lateral:  6.42 cm/s             LV E/e' lateral: 12.4     RIGHT VENTRICLE      IVC  RV S prime:   9.14 cm/s IVC diam: 0.90 cm  TAPSE (M-mode): 1.0 cm  RVSP:      22.2 mmHg   LEFT ATRIUM       Index    RIGHT ATRIUM      Index  LA diam:    3.90 cm 2.03 cm/m RA Pressure: 3.00 mmHg  LA Vol (A2C):  35.3 ml 18.39 ml/m RA Area:   14.10 cm  LA Vol (A4C):  35.9 ml 18.70 ml/m RA Volume:  37.30 ml 19.43 ml/m  LA Biplane Vol: 37.1 ml 19.33 ml/m  AORTIC VALVE  LVOT Vmax:  77.30 cm/s  LVOT Vmean: 47.900 cm/s  LVOT VTI:  0.143 m    AORTA  Ao Root diam: 3.60 cm  Ao Asc diam: 3.80 cm   MITRAL VALVE        TRICUSPID VALVE  MV Area (PHT):       TR Peak grad:  19.2 mmHg  MV Mean grad: 2.0 mmHg  TR Vmax:    219.00 cm/s               Estimated RAP: 3.00 mmHg  MV E velocity: 79.80 cm/s RVSP:      22.2 mmHg  MV A velocity: 82.50 cm/s  MV E/A ratio: 0.97    SHUNTS               Systemic VTI: 0.14 m   Eleonore Chiquito [name]  Electronically signed  by Eleonore Chiquito [name]  Signature Date/Time: 04/30/2020/2:13:32 PM      Impression:  Patient is doing very well approximately 1 year status post mitral valve repair  Plan:  We have not recommended any changes to the patient's current medications.  The patient will continue to follow-up with his new cardiologist on an annual basis.  He follows up regularly with his primary care physician, Dr. Joylene Draft.  He will call and return to our office in the future on should specific problems or questions arise.  I spent in excess of 15 minutes during the conduct of this office consultation and >50% of this time involved direct face-to-face encounter with the patient for counseling and/or coordination of their care.    Valentina Gu. [name], [name] 07/23/2020 2:39 PM

## 2020-07-24 DIAGNOSIS — H6123 Impacted cerumen, bilateral: Secondary | ICD-10-CM | POA: Diagnosis not present

## 2020-09-17 DIAGNOSIS — Z20822 Contact with and (suspected) exposure to covid-19: Secondary | ICD-10-CM | POA: Diagnosis not present

## 2020-09-18 ENCOUNTER — Telehealth: Payer: Self-pay | Admitting: Cardiology

## 2020-09-18 ENCOUNTER — Ambulatory Visit (INDEPENDENT_AMBULATORY_CARE_PROVIDER_SITE_OTHER): Payer: Medicare Other

## 2020-09-18 DIAGNOSIS — I495 Sick sinus syndrome: Secondary | ICD-10-CM | POA: Diagnosis not present

## 2020-09-18 LAB — CUP PACEART REMOTE DEVICE CHECK
Battery Remaining Longevity: 137 mo
Battery Voltage: 3.01 V
Brady Statistic AP VP Percent: 0.02 %
Brady Statistic AP VS Percent: 19.11 %
Brady Statistic AS VP Percent: 0.03 %
Brady Statistic AS VS Percent: 80.84 %
Brady Statistic RA Percent Paced: 19.36 %
Brady Statistic RV Percent Paced: 0.05 %
Date Time Interrogation Session: 20220711224811
Implantable Lead Implant Date: 20190805
Implantable Lead Implant Date: 20190805
Implantable Lead Location: 753859
Implantable Lead Location: 753860
Implantable Lead Model: 5076
Implantable Lead Model: 5076
Implantable Pulse Generator Implant Date: 20190805
Lead Channel Impedance Value: 285 Ohm
Lead Channel Impedance Value: 304 Ohm
Lead Channel Impedance Value: 399 Ohm
Lead Channel Impedance Value: 399 Ohm
Lead Channel Pacing Threshold Amplitude: 0.625 V
Lead Channel Pacing Threshold Amplitude: 0.75 V
Lead Channel Pacing Threshold Pulse Width: 0.4 ms
Lead Channel Pacing Threshold Pulse Width: 0.4 ms
Lead Channel Sensing Intrinsic Amplitude: 3.875 mV
Lead Channel Sensing Intrinsic Amplitude: 3.875 mV
Lead Channel Sensing Intrinsic Amplitude: 3.875 mV
Lead Channel Sensing Intrinsic Amplitude: 3.875 mV
Lead Channel Setting Pacing Amplitude: 2 V
Lead Channel Setting Pacing Amplitude: 2.5 V
Lead Channel Setting Pacing Pulse Width: 0.4 ms
Lead Channel Setting Sensing Sensitivity: 1.2 mV

## 2020-09-18 NOTE — Telephone Encounter (Signed)
pts husband has covid and she is needing treatment with anti viral.. please advise

## 2020-09-18 NOTE — Telephone Encounter (Signed)
Pts wife is calling to seek direction on where she and the pt should go to obtain oral antiviral, for recent diagnosis of covid.  Advised the pts wife to contact their PCP for this request or possibly seek assistance at a [facility]. Wife verbalized understanding and agrees with this plan. She will call their PCP today.

## 2020-10-09 DIAGNOSIS — H6123 Impacted cerumen, bilateral: Secondary | ICD-10-CM | POA: Diagnosis not present

## 2020-10-11 NOTE — Progress Notes (Signed)
Remote pacemaker transmission.   

## 2020-10-28 IMAGING — CT CT CTA ABD/PEL W/CM AND/OR W/O CM
2 of 7 series · 15 of 36 positions shown · non-contrast
Comparison: Prior CTA chest 03/14/2019 and CT chest 05/03/2018.

CLINICAL DATA: 79-year-old male undergoing preoperative evaluation
prior to repair of mitral valve prolapse.

EXAM:
CT ANGIOGRAPHY CHEST, ABDOMEN AND PELVIS
TECHNIQUE: Non-contrast CT of the chest was initially obtained.

[Series 5: cta cap aneurysm 2.00 bv36 s3 axial arterial · axial · arterial · 0.77mm/px · z∈[+1075,+1659]mm · 14 of 332 slices shown]
[im 20/332  lung]
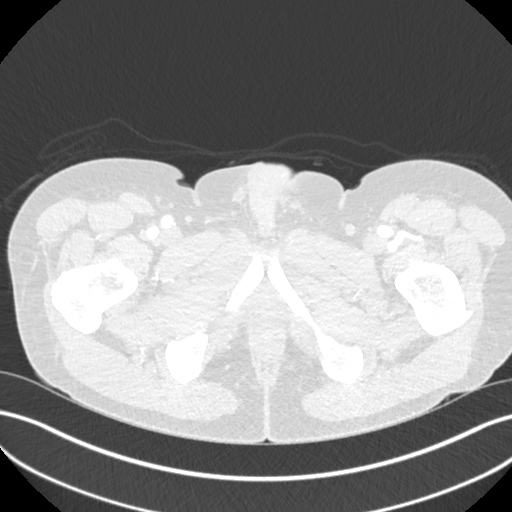
[im 39/332  mediastinal]
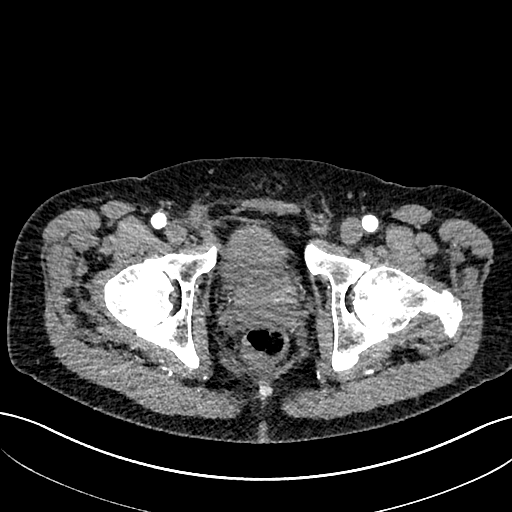
[im 59/332  lung]
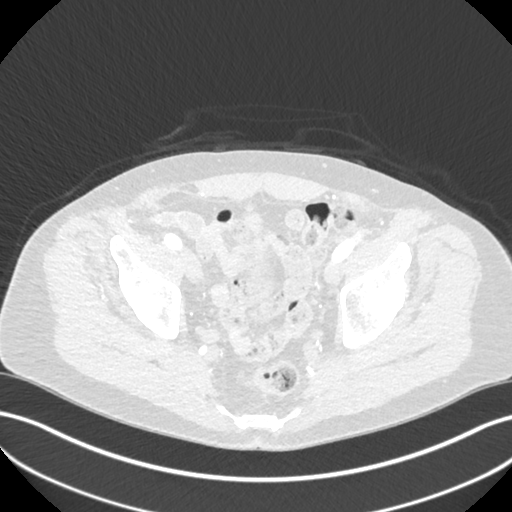
[im 98/332  mediastinal]
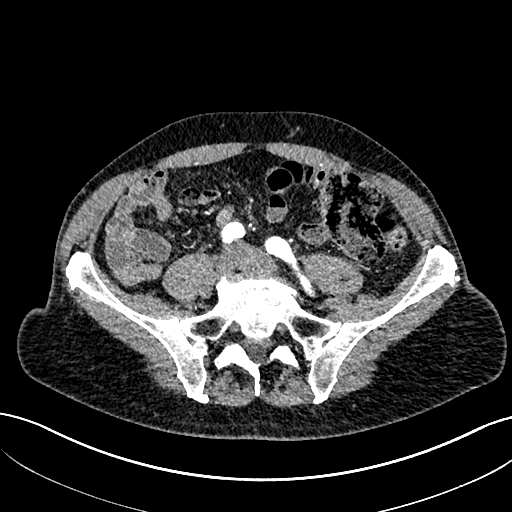
[im 117/332  lung]
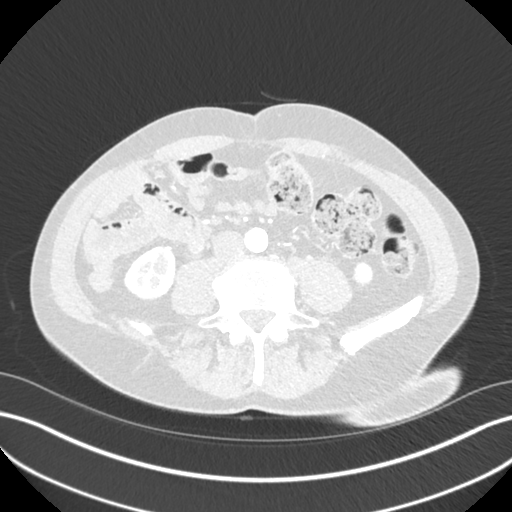
[im 137/332  mediastinal]
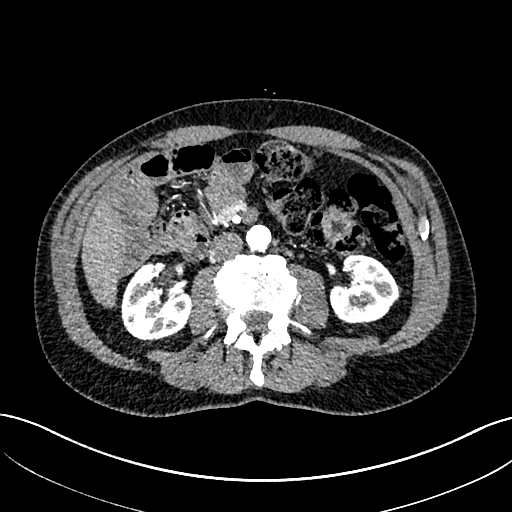
[im 156/332  lung]
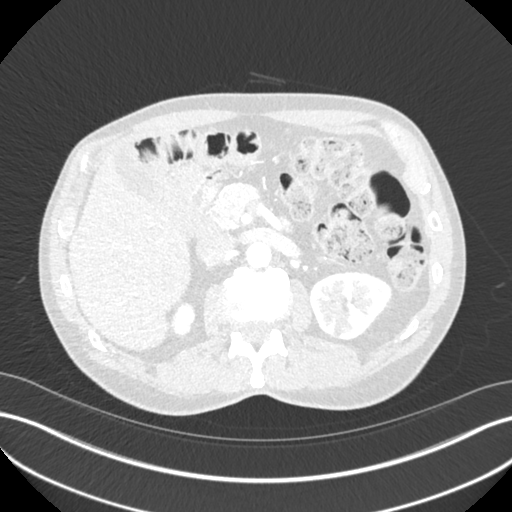
[im 176/332  mediastinal]
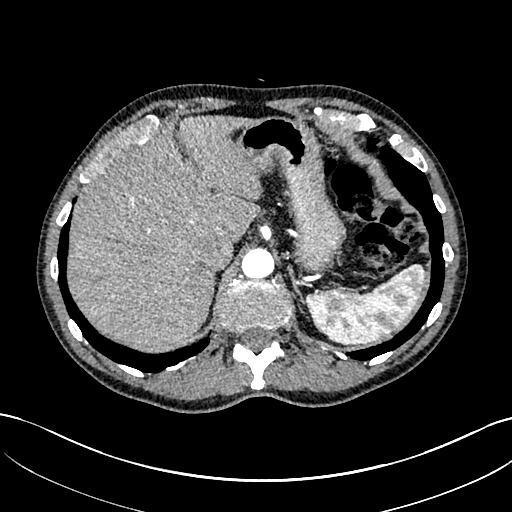
[im 195/332  lung]
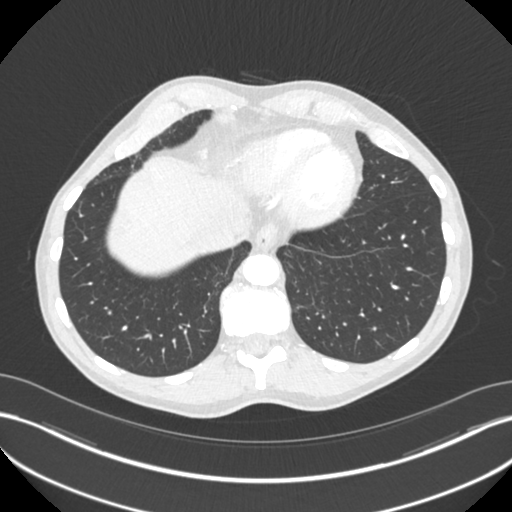
[im 215/332  mediastinal]
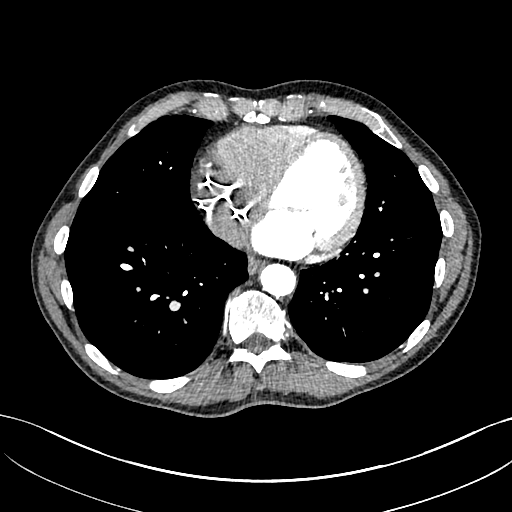
[im 254/332  lung]
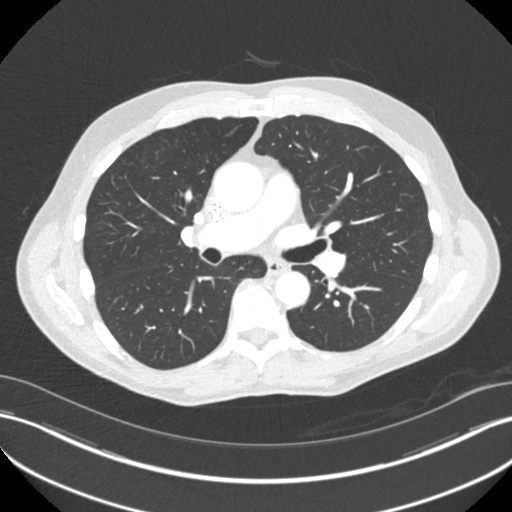
[im 273/332  mediastinal]
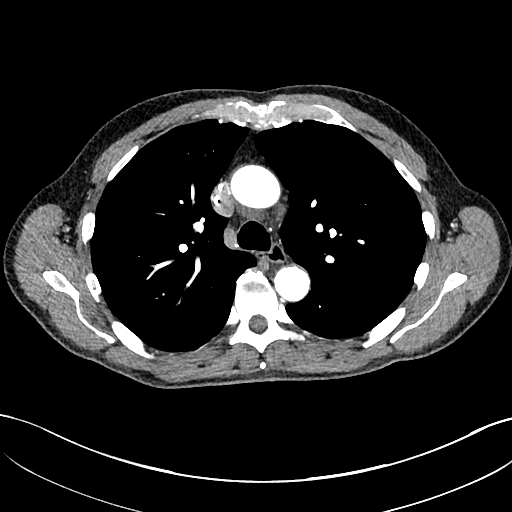
[im 293/332  lung]
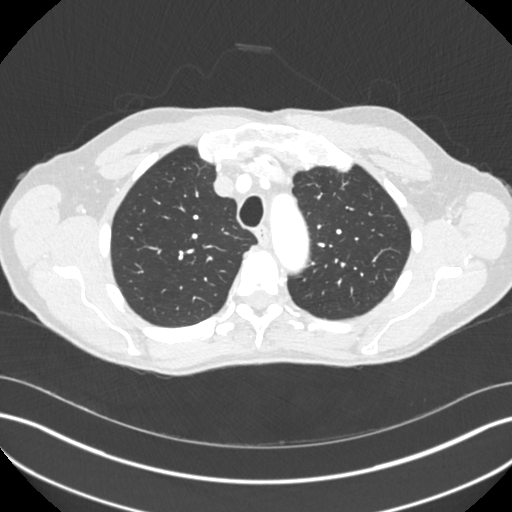
[im 312/332  mediastinal]
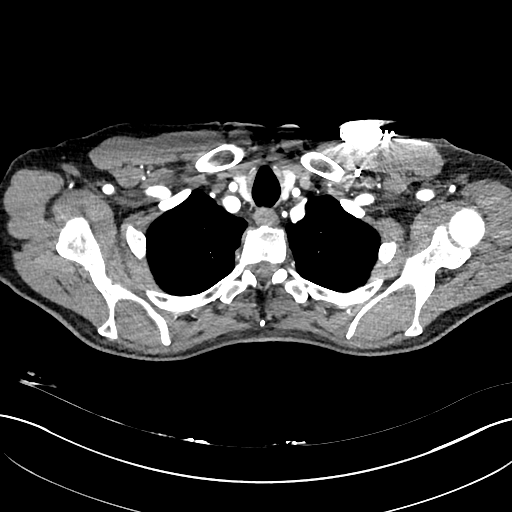

[Series 7: cta cap aneurysm 2.00 bv36 s3 cor st · coronal · 0.77mm/px · 1 of 143 slices shown]
[im 72/143  mediastinal]
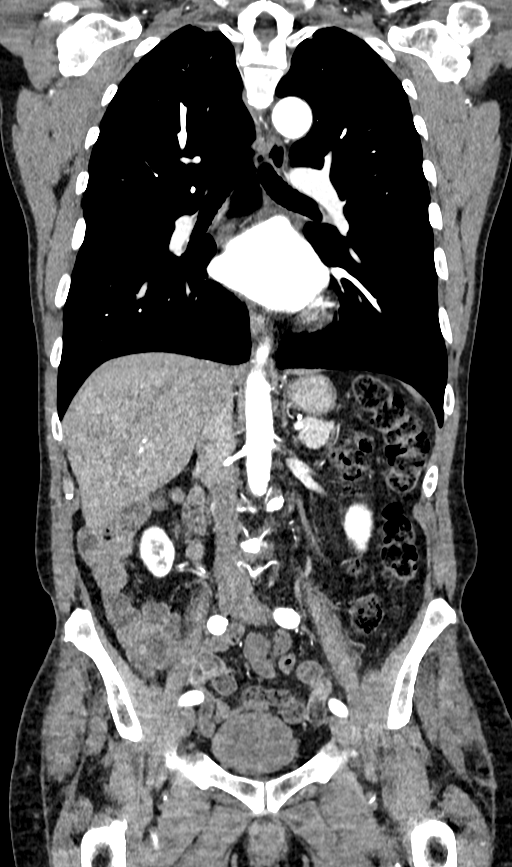

[15 of 36 positions shown; findings below may reference images not displayed]

Multidetector CT imaging through the chest, abdomen and pelvis was
performed using the standard protocol during bolus administration of
intravenous contrast. Multiplanar reconstructed images and MIPs were
obtained and reviewed to evaluate the vascular anatomy.

CONTRAST:  75mL 9S59P4-XO7 IOPAMIDOL ([id number]) INJECTION 76%
FINDINGS: CTA CHEST FINDINGS

Cardiovascular: Conventional 3 vessel arch anatomy. Elongation of
the aortic isthmus and proximal descending thoracic aorta resulting
a type 3 arch. Mild ectasia of the tubular portion of the ascending
thoracic aorta with a maximal transverse diameter of 3.7 cm. No
evidence of dissection. The aortic root, transverse aorta and
descending thoracic aorta are all normal in caliber. There is scant
atherosclerotic plaque. Mild left ventricular dilatation without
overt cardiomegaly. Left subclavian approach cardiac rhythm
maintenance device with leads terminating in the right atrium and
right ventricular apex. No pericardial effusion.

Mediastinum/Nodes: Unremarkable CT appearance of the thyroid gland.
No suspicious mediastinal or hilar adenopathy. No soft tissue
mediastinal mass. The thoracic esophagus is unremarkable.

Lungs/Pleura: Biapical pleuroparenchymal scarring. A 4 mm pulmonary
nodule in the right lower lobe (image 74, series 15) is new compared
to prior imaging from [DATE] mm pulmonary nodule which is
likely partially calcified in the periphery of the left lower lobe
(image 131, series 15). No focal airspace consolidation, pleural
effusion, pulmonary edema or pneumothorax.

Musculoskeletal: No acute fracture or aggressive appearing lytic or
blastic osseous lesion.

Review of the MIP images confirms the above findings.

CTA ABDOMEN AND PELVIS FINDINGS

VASCULAR

Aorta: Normal caliber abdominal aorta. Mild calcified
atherosclerotic plaque. No dissection or penetrating ulceration.

Celiac: Patent without evidence of aneurysm, dissection, vasculitis
or significant stenosis. The left hepatic artery is replaced to the
left gastric artery.

SMA: Patent without evidence of aneurysm, dissection, vasculitis or
significant stenosis.

Renals: 2 renal arteries are present bilaterally. On the right, the
accessory lower pole renal artery arises from the distal most aorta
at the bifurcation. Minimal plaque at the origins without
significant stenosis.

IMA: Patent without evidence of aneurysm, dissection, vasculitis or
significant stenosis.

Inflow: Mild atherosclerotic plaque. Widely patent. The iliac
arteries are mildly tortuous. The common iliac arteries are ectatic
bilaterally but not aneurysmal. The arteries measure up to 1.4 cm
bilaterally.

Veins: No focal venous abnormality.

Review of the MIP images confirms the above findings.

NON-VASCULAR

Hepatobiliary: No focal liver abnormality is seen. No gallstones,
gallbladder wall thickening, or biliary dilatation.

Pancreas: Unremarkable. No pancreatic ductal dilatation or
surrounding inflammatory changes.

Spleen: Normal in size without focal abnormality.

Adrenals/Urinary Tract: Adrenal glands are unremarkable. Kidneys are
normal, without renal calculi, focal lesion, or hydronephrosis.
Bladder is unremarkable.

Stomach/Bowel: Congenital malrotation. The duodenum does not cross
the midline and the entirety of the colon is located in the left
hemiabdomen including the cecum and appendix. No focal bowel wall
thickening or evidence of obstruction.

Lymphatic: No suspicious lymphadenopathy.

Reproductive: Prostatomegaly.

Other: Small fat containing umbilical hernia.

Musculoskeletal: No acute fracture or aggressive appearing lytic or
blastic osseous lesion.

Review of the MIP images confirms the above findings.
IMPRESSION: VASCULAR

1. Fairly mild atherosclerotic plaque without evidence of
significant stenosis, occlusion, dissection, aneurysm or other acute
arterial abnormality. Aortic Atherosclerosis ([id number]-170.0)
2. Mild left ventricular dilatation history of mitral regurgitation.
3. Bilateral accessory renal arteries.
4. Left hepatic artery replaced to the left gastric artery.
5. Mild ectasia and tortuosity of the bilateral iliac arteries.

NON VASCULAR

1. Congenital malrotation without evidence of complication. The
duodenum does not cross the midline and the entirety of the colon
lies in the left hemiabdomen including the cecum and appendix.
2. Small 4 mm pulmonary nodule in the right lower lobe is new
compared to relatively recent prior imaging from [facility]. This
almost certainly represents a small focus of active
inflammation/infection. No further follow-up required unless the
patient is at high risk for pulmonary malignancy.
3. Stable 5 mm left lower lobe benign pulmonary nodule.
4. Small fat containing umbilical hernia.

## 2020-11-30 ENCOUNTER — Ambulatory Visit: Payer: Medicare Other | Attending: Internal Medicine

## 2020-11-30 ENCOUNTER — Other Ambulatory Visit (HOSPITAL_BASED_OUTPATIENT_CLINIC_OR_DEPARTMENT_OTHER): Payer: Self-pay

## 2020-11-30 DIAGNOSIS — Z23 Encounter for immunization: Secondary | ICD-10-CM

## 2020-11-30 MED ORDER — PFIZER COVID-19 VAC BIVALENT 30 MCG/0.3ML IM SUSP
INTRAMUSCULAR | 0 refills | Status: DC
  Filled 2020-11-30: qty 0.3, 1d supply, fill #0

## 2020-11-30 NOTE — Progress Notes (Signed)
   Covid-[facility] Vaccination [facility]  Name:  Alexander [name]    MRN: [id number] DOB: November 02, 1940  11/30/2020  [name] was observed post Covid-19 immunization for 15 minutes without incident. He was provided with Vaccine Information Sheet and instruction to access the V-Safe system.   [name] was instructed to call [phone] with any severe reactions post vaccine: Difficulty breathing  Swelling of face and throat  A fast heartbeat  A bad rash all over body  Dizziness and weakness

## 2020-12-03 ENCOUNTER — Other Ambulatory Visit (HOSPITAL_BASED_OUTPATIENT_CLINIC_OR_DEPARTMENT_OTHER): Payer: Self-pay

## 2020-12-07 ENCOUNTER — Other Ambulatory Visit (HOSPITAL_COMMUNITY): Payer: Self-pay

## 2020-12-07 DIAGNOSIS — Z23 Encounter for immunization: Secondary | ICD-10-CM | POA: Diagnosis not present

## 2020-12-07 MED ORDER — INFLUENZA VAC A&B SA ADJ QUAD 0.5 ML IM PRSY
PREFILLED_SYRINGE | INTRAMUSCULAR | 0 refills | Status: DC
  Filled 2020-12-07: qty 0.5, 1d supply, fill #0

## 2020-12-18 ENCOUNTER — Ambulatory Visit (INDEPENDENT_AMBULATORY_CARE_PROVIDER_SITE_OTHER): Payer: Medicare Other

## 2020-12-18 DIAGNOSIS — I495 Sick sinus syndrome: Secondary | ICD-10-CM | POA: Diagnosis not present

## 2020-12-18 LAB — CUP PACEART REMOTE DEVICE CHECK
Battery Remaining Longevity: 133 mo
Battery Voltage: 3.01 V
Brady Statistic AP VP Percent: 0.01 %
Brady Statistic AP VS Percent: 20.39 %
Brady Statistic AS VP Percent: 0.03 %
Brady Statistic AS VS Percent: 79.58 %
Brady Statistic RA Percent Paced: 20.55 %
Brady Statistic RV Percent Paced: 0.04 %
Date Time Interrogation Session: 20221010214949
Implantable Lead Implant Date: 20190805
Implantable Lead Implant Date: 20190805
Implantable Lead Location: 753859
Implantable Lead Location: 753860
Implantable Lead Model: 5076
Implantable Lead Model: 5076
Implantable Pulse Generator Implant Date: 20190805
Lead Channel Impedance Value: 285 Ohm
Lead Channel Impedance Value: 323 Ohm
Lead Channel Impedance Value: 361 Ohm
Lead Channel Impedance Value: 399 Ohm
Lead Channel Pacing Threshold Amplitude: 0.625 V
Lead Channel Pacing Threshold Amplitude: 0.875 V
Lead Channel Pacing Threshold Pulse Width: 0.4 ms
Lead Channel Pacing Threshold Pulse Width: 0.4 ms
Lead Channel Sensing Intrinsic Amplitude: 3 mV
Lead Channel Sensing Intrinsic Amplitude: 3 mV
Lead Channel Sensing Intrinsic Amplitude: 4 mV
Lead Channel Sensing Intrinsic Amplitude: 4 mV
Lead Channel Setting Pacing Amplitude: 2 V
Lead Channel Setting Pacing Amplitude: 2.5 V
Lead Channel Setting Pacing Pulse Width: 0.4 ms
Lead Channel Setting Sensing Sensitivity: 1.2 mV

## 2020-12-25 DIAGNOSIS — H61891 Other specified disorders of right external ear: Secondary | ICD-10-CM | POA: Diagnosis not present

## 2020-12-25 DIAGNOSIS — H938X3 Other specified disorders of ear, bilateral: Secondary | ICD-10-CM | POA: Diagnosis not present

## 2020-12-27 NOTE — Progress Notes (Signed)
Remote pacemaker transmission.   

## 2021-01-09 DIAGNOSIS — G3184 Mild cognitive impairment, so stated: Secondary | ICD-10-CM | POA: Diagnosis not present

## 2021-01-29 ENCOUNTER — Other Ambulatory Visit: Payer: Self-pay

## 2021-01-29 ENCOUNTER — Ambulatory Visit (INDEPENDENT_AMBULATORY_CARE_PROVIDER_SITE_OTHER): Payer: Medicare Other | Admitting: Cardiology

## 2021-01-29 ENCOUNTER — Encounter: Payer: Self-pay | Admitting: Cardiology

## 2021-01-29 VITALS — BP 122/72 | HR 78 | Ht 69.0 in | Wt 168.0 lb

## 2021-01-29 DIAGNOSIS — E7849 Other hyperlipidemia: Secondary | ICD-10-CM | POA: Diagnosis not present

## 2021-01-29 DIAGNOSIS — Z9889 Other specified postprocedural states: Secondary | ICD-10-CM

## 2021-01-29 DIAGNOSIS — I341 Nonrheumatic mitral (valve) prolapse: Secondary | ICD-10-CM

## 2021-01-29 DIAGNOSIS — I10 Essential (primary) hypertension: Secondary | ICD-10-CM | POA: Diagnosis not present

## 2021-01-29 DIAGNOSIS — I480 Paroxysmal atrial fibrillation: Secondary | ICD-10-CM | POA: Diagnosis not present

## 2021-01-29 DIAGNOSIS — I495 Sick sinus syndrome: Secondary | ICD-10-CM | POA: Diagnosis not present

## 2021-01-29 NOTE — Progress Notes (Signed)
Cardiology Office Note:    Date:  01/29/2021   ID:  Alexander [name], DOB 21-Apr-1940, MRN [id number]  PCP:  Alexander Infante, MD  Cardiologist:  Alexander Dawley, MD (Inactive)    Referring MD: Alexander Infante, MD   No chief complaint on file.   History of Present Illness:    Alexander [name] is a 80 y.o. male with a history of mitral valve prolapse, remote history of syncope status post permanent pacemaker for sinus node arrest, hypertension, HLD, RBBB coming in today for routine follow-up visit. He is a retired Doctor, general practice.  Per review of the record, the patient underwent mitral valve repair via right minithoracotomy 06/14/2019 by Dr.Owen. He developed post-op atrial fibrillation and was started on amiodarone then converted to normal sinus rhythm. Pre-op cath revealed no evidence of CAD. Normal right and left heart pressures. He had been doing great early post surgery, walked several miles a day and played golf almost on a daily basis. His Coumadin was discontinued after 3 months, was on aspirin only.  His recent echocardiogram on 04/30/2020 shows normal mean gradient of 2 mmHg, there is no mitral regurgitation.  The EF is 50 to 55%.  He last saw Dr. Meda [name] on 05/17/20 and was doing well.   Today, he is doing great. He remains very active with no anginal symptoms. Denies any chest pain, SOB, orthopnea or PND. Blood pressure is well controlled. He takes Repatha for cholesterol treatment. He has tried statins in the past and tolerated them. However, the statins did not bring his cholesterol down enough. His cholesterol is followed by Dr. Joylene [name] on a yearly basis.   He denies any lightheadedness, headaches, syncope, orthopnea, PND, lower extremity edema or exertional symptoms.  Past Medical History:  Diagnosis Date   Heart murmur    Hyperlipidemia    Hypertension    Mitral regurgitation    MVP (mitral valve prolapse)    Presence of permanent cardiac pacemaker    Snoring  09/07/2019   Syncope     Past Surgical History:  Procedure Laterality Date   BUBBLE STUDY  05/23/2019   Procedure: BUBBLE STUDY;  Surgeon: Alexander Spark, MD;  Location: [facility];  Service: Cardiovascular;;   HERNIA REPAIR     MITRAL VALVE REPAIR Right 06/14/2019   Procedure: MINIMALLY INVASIVE MITRAL VALVE REPAIR (MVR)<TEE;  Surgeon: Alexander Alberts, MD;  Location: [facility];  Service: Open Heart Surgery;  Laterality: Right;   PACEMAKER IMPLANT N/A 10/12/2017   Procedure: PACEMAKER IMPLANT;  Surgeon: Alexander Haw, MD;  Location: [facility];  Service: Cardiovascular;  Laterality: N/A;   RIGHT/LEFT HEART CATH AND CORONARY ANGIOGRAPHY N/A 06/06/2019   Procedure: RIGHT/LEFT HEART CATH AND CORONARY ANGIOGRAPHY;  Surgeon: Alexander Blanks, MD;  Location: [facility];  Service: Cardiovascular;  Laterality: N/A;   TEE WITHOUT CARDIOVERSION N/A 05/23/2019   Procedure: TRANSESOPHAGEAL ECHOCARDIOGRAM (TEE);  Surgeon: Alexander Spark, MD;  Location: [facility];  Service: Cardiovascular;  Laterality: N/A;   TEE WITHOUT CARDIOVERSION N/A 06/14/2019   Procedure: TRANSESOPHAGEAL ECHOCARDIOGRAM (TEE);  Surgeon: Alexander Alberts, MD;  Location: [facility];  Service: Open Heart Surgery;  Laterality: N/A;   TONSILECTOMY, ADENOIDECTOMY, BILATERAL MYRINGOTOMY AND TUBES     as child   TONSILLECTOMY     TOTAL KNEE ARTHROPLASTY Left    TOTAL KNEE ARTHROPLASTY Left 02/23/2019   Procedure: TOTAL KNEE ARTHROPLASTY;  Surgeon: Alexander Arabian, MD;  Location: [facility];  Service: Orthopedics;  Laterality: Left;  68min    Current Medications: Current Meds  Medication Sig   aspirin EC 81 MG tablet Take 1 tablet (81 mg total) by mouth daily. Swallow whole.   COVID-19 mRNA bivalent vaccine, Pfizer, (PFIZER COVID-19 VAC BIVALENT) injection Inject into the muscle.   COVID-19 mRNA vaccine, Pfizer, 30 MCG/0.3ML injection Inject into the muscle.   Evolocumab (REPATHA [place]) Inject into the skin.    ezetimibe (ZETIA) 10 MG tablet Take 10 mg by mouth daily.   influenza vaccine adjuvanted (FLUAD) 0.5 ML injection Inject into the muscle.   zolpidem (AMBIEN) 10 MG tablet Take 5-10 mg by mouth at bedtime as needed.      Allergies:   Patient has no known allergies.   Social History   Socioeconomic History   Marital status: Married    Spouse name: Not on file   Number of children: Not on file   Years of education: Not on file   Highest education level: Not on file  Occupational History   Not on file  Tobacco Use   Smoking status: Never   Smokeless tobacco: Never  Vaping Use   Vaping Use: Never used  Substance and Sexual Activity   Alcohol use: Yes    Alcohol/week: 5.0 standard drinks    Types: 5 Glasses of wine per week   Drug use: No   Sexual activity: Not on file  Other Topics Concern   Not on file  Social History Narrative   Not on file   Social Determinants of Health   Financial Resource Strain: Not on file  Food Insecurity: Not on file  Transportation Needs: Not on file  Physical Activity: Not on file  Stress: Not on file  Social Connections: Not on file     Family History: The patient's family history includes Alzheimer's disease in his mother; Hypertension in his father.  ROS:   Please see the history of present illness.    Review of Systems  Constitutional: Negative for chills and fever.  HENT:  Negative for congestion and sore throat.   Eyes:  Negative for double vision and pain.  Cardiovascular:  Negative for chest pain, claudication, cyanosis, dyspnea on exertion, irregular heartbeat, leg swelling, near-syncope, orthopnea, palpitations, paroxysmal nocturnal dyspnea and syncope.  Respiratory:  Negative for snoring and wheezing.   Endocrine: Negative for cold intolerance and heat intolerance.  Hematologic/Lymphatic: Negative for adenopathy and bleeding problem.  Skin:  Negative for dry skin and rash.  Musculoskeletal:  Negative for falls and myalgias.   Gastrointestinal:  Negative for abdominal pain and diarrhea.  Genitourinary:  Negative for dysuria and frequency.  Neurological:  Negative for dizziness and seizures.  Psychiatric/Behavioral:  The patient does not have insomnia.    All other systems reviewed and negative.   EKGs/Labs/Other Studies Reviewed:    The following studies were reviewed today: Echo 04/30/20  1. The mitral valve has been repaired/replaced. No evidence of mitral  valve regurgitation. No evidence of mitral stenosis. The mean mitral valve  gradient is 2.0 mmHg with average heart rate of 68 bpm. There is a 40 mm prosthetic annuloplasty ring present in the mitral position. Procedure Date: 06/14/2019.   2. Left ventricular ejection fraction, by estimation, is 50 to 55%. The  left ventricle has low normal function. The left ventricle has no regional  wall motion abnormalities. Left ventricular diastolic function could not  be evaluated.   3. Right ventricular systolic function is normal. The right ventricular  size is normal. There is normal pulmonary  artery systolic pressure. The  estimated right ventricular systolic pressure is 93.2 mmHg.   4. The aortic valve is tricuspid. Aortic valve regurgitation is not visualized. No aortic stenosis is present.   5. The inferior vena cava is normal in size with greater than 50%  respiratory variability, suggesting right atrial pressure of 3 mmHg.  Comparison(s): No significant change from prior study.   CTA Chest 06/13/19 IMPRESSION: VASCULAR 1. Fairly mild atherosclerotic plaque without evidence of significant stenosis, occlusion, dissection, aneurysm or other acute arterial abnormality. Aortic Atherosclerosis (ICD10-170.0) 2. Mild left ventricular dilatation history of mitral regurgitation. 3. Bilateral accessory renal arteries. 4. 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 5. Mild ectasia and tortuosity of the bilateral iliac arteries.  NON VASCULAR 1.  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 The duodenum does not cross the midline and the entirety of the colon lies in the left hemiabdomen including the cecum and appendix. 2. Small 4 mm pulmonary nodule in the right lower lobe is new compared to relatively recent prior imaging from January. This almost certainly represents a small focus of active inflammation/infection. No further follow-up required unless the patient is at high risk for pulmonary malignancy. 3. Stable 5 mm left lower lobe benign pulmonary nodule. 4. Small fat containing umbilical hernia.        CARDIOTHORACIC SURGERY OPERATIVE NOTE   Date of Procedure:                06/14/2019   Preoperative Diagnosis:      Severe Mitral Regurgitation Minimally-Invasive Mitral Valve Repair             Complex valvuloplasty including artificial Gore-tex neochord placement x12             Sorin Memo 4D Ring Annuloplasty (size 42mm, catalog # [id number], serial # [id number])           Surgeon:        Valentina Gu. [name], MD Operative Findings: Barlow's type myxomatous degenerative disease Bileaflet prolapse with multiple elongated chordae tendinae Type II dysfunction with severe mitral regurgitation Normal left ventricular systolic function No residual mitral regurgitation after successful valve repair   Cardiac catheterization 06/06/2019 1. No angiographic evidence of CAD 2. Normal right and left sided pressures   Continue with planning for mitral valve surgery.    TTE: 04/30/2020   1. The mitral valve has been repaired/replaced. No evidence of mitral  valve regurgitation. No evidence of mitral stenosis. The mean mitral valve  gradient is 2.0 mmHg with average heart rate of 68 bpm. There is a 40 mm  prosthetic annuloplasty ring  present in the mitral position. Procedure Date: 06/14/2019.   2. Left ventricular ejection fraction, by estimation, is 50 to 55%. The  left ventricle has low normal function. The left ventricle has  no regional  wall motion abnormalities. Left ventricular diastolic function could not  be evaluated.   3. Right ventricular systolic function is normal. The right ventricular  size is normal. There is normal pulmonary artery systolic pressure. The  estimated right ventricular systolic pressure is 67.1 mmHg.   4. The aortic valve is tricuspid. Aortic valve regurgitation is not  visualized. No aortic stenosis is present.   5. The inferior vena cava is normal in size with greater than 50%  respiratory variability, suggesting right atrial pressure of 3 mmHg.  EKG:   01/29/21: EKG was not ordered today  Recent Labs: No results found for requested labs within last 8760 hours.  Recent Lipid Panel    Component Value Date/Time   CHOL 169 02/10/2019 1429   TRIG 80 02/10/2019 1429   HDL 81 02/10/2019 1429   CHOLHDL 2.1 02/10/2019 1429   LDLCALC 73 02/10/2019 1429    CHA2DS2-VASc Score =   [ ] .  Therefore, the patient's annual risk of stroke is   %.        Physical Exam:    VS:  BP 122/72   Pulse 78   Ht 5\' 9"  (1.753 m)   Wt 168 lb (76.2 kg)   SpO2 97%   BMI 24.81 kg/m     Wt Readings from Last 3 Encounters:  01/29/21 168 lb (76.2 kg)  07/23/20 168 lb (76.2 kg)  06/19/20 173 lb (78.5 kg)     GEN:  Well nourished, well developed in no acute distress HEENT: Normal NECK: No JVD; No carotid bruits LYMPHATICS: No lymphadenopathy CARDIAC: RRR, no murmurs, rubs, gallops RESPIRATORY:  Clear to auscultation without rales, wheezing or rhonchi  ABDOMEN: Soft, non-tender, non-distended MUSCULOSKELETAL:  No edema; No deformity  SKIN: Warm and dry NEUROLOGIC:  Alert and oriented x 3 PSYCHIATRIC:  Normal affect   ASSESSMENT:    1. S/P minimally invasive mitral valve repair   2. PAF (paroxysmal atrial fibrillation) ([facility])   3. MVP (mitral valve prolapse)   4. Primary hypertension   5. Other hyperlipidemia   6. 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 [name]. Doing very well with no exercise limitations. His most recent echocardiogram on 04/30/2020 shows normal mean gradient of 2 mmHg, there is no mitral regurgitation.  The EF is 50 to 55%. -Repeat TTE in 04/2021  #Post-op Afib: Remains in NSR off amiodarone. Not on AC. No palpitations  #Sinus Arrest s/p PPM placement: -Follow-up with EP as scheduled  #HLD: Calcium score was 94 which was 27th percentile but cardiac cath showed minimal CAD. 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 On repatha and zetia. LDL controlled. -Continue repatha per PCP -Continue zetia 10mg  daily -Lipids per PCP  Medication Adjustments/Labs and Tests Ordered: Current medicines are reviewed at length with the patient today.  Concerns regarding medicines are outlined above.  No orders of the defined types were placed in this encounter.  No orders of the defined types were placed in this encounter.  I,Mykaella Javier,acting as a scribe for Freada Bergeron, MD.,have documented all relevant documentation on the behalf of Freada Bergeron, MD,as directed by  Freada Bergeron, MD while in the presence of Freada Bergeron, MD.  I, Freada Bergeron, MD, have reviewed all documentation for this visit. The documentation on 01/29/21 for the exam, diagnosis, procedures, and orders are all accurate and complete.   Signed, Freada Bergeron, MD  01/29/2021 2:08 PM    [facility]

## 2021-01-29 NOTE — Patient Instructions (Signed)
Medication Instructions:   Your physician recommends that you continue on your current medications as directed. Please refer to the Current Medication list given to you today.  *If you need a refill on your cardiac medications before your next appointment, please call your pharmacy*   Follow-Up: At [facility], you and your health needs are our priority.  As part of our continuing mission to provide you with exceptional heart care, we have created designated Provider Care Teams.  These Care Teams include your primary Cardiologist (physician) and Advanced Practice Providers (APPs -  Physician Assistants and Nurse Practitioners) who all work together to provide you with the care you need, when you need it.  We recommend signing up for the patient portal called "MyChart".  Sign up information is provided on this After Visit Summary.  MyChart is used to connect with patients for Virtual Visits (Telemedicine).  Patients are able to view lab/test results, encounter notes, upcoming appointments, etc.  Non-urgent messages can be sent to your provider as well.   To learn more about what you can do with MyChart, go to [email or web address].    Your next appointment:   6 month(s)  The format for your next appointment:   In Person  Provider:    DR. PEMBERTON   

## 2021-02-03 DIAGNOSIS — Z20822 Contact with and (suspected) exposure to covid-19: Secondary | ICD-10-CM | POA: Diagnosis not present

## 2021-02-14 ENCOUNTER — Telehealth: Payer: Self-pay | Admitting: Cardiology

## 2021-02-14 NOTE — Telephone Encounter (Signed)
Pt c/o medication issue:  1. Name of Medication: Evolocumab (REPATHA [place])  2. How are you currently taking this medication (dosage and times per day)? Inject into the skin.  3. Are you having a reaction (difficulty breathing--STAT)? no  4. What is your medication issue? Patient is calling to see if would found another medication he can take instead of the repatha. He states it cost too much

## 2021-02-14 NOTE — Telephone Encounter (Signed)
Pt aware of our Pharmacist recommendations, as indicated below. Pt states he will reach out to Dr. Silvestre Mesi office for further assistance with this, and will call us back if needed for referral to lipid clinic.  Pt states all the information provided by our Pharmacist is super helpful in providing to Dr. Silvestre Mesi office. Pt verbalized understanding and agrees with this plan. Pt was more than gracious for all the assistance provided.

## 2021-02-14 NOTE — Telephone Encounter (Signed)
Along with what megan said, Marion Downer may be less expensive for him. He could discuss with Dr. Joylene Draft unless he wants [place] to take over his cholesterol managment

## 2021-02-14 NOTE — Telephone Encounter (Signed)
We do not manage pt's Repatha, Dr Joylene Draft is prescribing it. Recommend pt reach out to managing MD to discuss. He may qualify for [employer] which aids with copay of [place].

## 2021-02-14 NOTE — Telephone Encounter (Signed)
Will route to our PharmD team and Prior Auth Nurse, to see if they can assist the pt with this matter.

## 2021-02-20 DIAGNOSIS — R972 Elevated prostate specific antigen [PSA]: Secondary | ICD-10-CM | POA: Diagnosis not present

## 2021-02-21 NOTE — Telephone Encounter (Signed)
Pt called in today to report that he is going to call Dr. Silvestre Mesi office to tell them he would like for our lipid clinic to take over managing his Repatha and lipids, for continuity of care.  He states once he calls to tell his PCP that, he will notify our office so that we can refer him to our lipid clinic to see the Pharmacist, for further management of his lipids.   Informed the pt that would be fine and we will await for his callback, so we can place the referral and obtain an appt with Lipid clinic for him. Pt verbalized understanding and agrees with this plan.

## 2021-02-21 NOTE — Telephone Encounter (Signed)
Patient calling back to follow up on how much the leqvio will cost him.

## 2021-02-27 DIAGNOSIS — R972 Elevated prostate specific antigen [PSA]: Secondary | ICD-10-CM | POA: Diagnosis not present

## 2021-03-08 ENCOUNTER — Other Ambulatory Visit (HOSPITAL_COMMUNITY): Payer: Self-pay | Admitting: *Deleted

## 2021-03-12 ENCOUNTER — Other Ambulatory Visit: Payer: Self-pay

## 2021-03-12 ENCOUNTER — Ambulatory Visit (HOSPITAL_COMMUNITY)
Admission: RE | Admit: 2021-03-12 | Discharge: 2021-03-12 | Disposition: A | Discharge status: Home / Self Care | Payer: Medicare Other | Source: Ambulatory Visit | Attending: Internal Medicine | Admitting: Internal Medicine

## 2021-03-12 DIAGNOSIS — E785 Hyperlipidemia, unspecified: Secondary | ICD-10-CM | POA: Diagnosis not present

## 2021-03-12 DIAGNOSIS — T466X5A Adverse effect of antihyperlipidemic and antiarteriosclerotic drugs, initial encounter: Secondary | ICD-10-CM | POA: Insufficient documentation

## 2021-03-12 DIAGNOSIS — M791 Myalgia, unspecified site: Secondary | ICD-10-CM | POA: Diagnosis not present

## 2021-03-12 DIAGNOSIS — I2510 Atherosclerotic heart disease of native coronary artery without angina pectoris: Secondary | ICD-10-CM | POA: Insufficient documentation

## 2021-03-12 MED ORDER — INCLISIRAN SODIUM 284 MG/1.5ML ~~LOC~~ SOSY
284.0000 mg | PREFILLED_SYRINGE | Freq: Once | SUBCUTANEOUS | Status: AC
  Administered 2021-03-12: 284 mg via SUBCUTANEOUS

## 2021-03-12 MED ORDER — INCLISIRAN SODIUM 284 MG/1.5ML ~~LOC~~ SOSY
PREFILLED_SYRINGE | SUBCUTANEOUS | Status: AC
  Filled 2021-03-12: qty 1.5

## 2021-03-19 ENCOUNTER — Ambulatory Visit (INDEPENDENT_AMBULATORY_CARE_PROVIDER_SITE_OTHER): Payer: Medicare Other

## 2021-03-19 DIAGNOSIS — I495 Sick sinus syndrome: Secondary | ICD-10-CM | POA: Diagnosis not present

## 2021-03-21 DIAGNOSIS — H6121 Impacted cerumen, right ear: Secondary | ICD-10-CM | POA: Diagnosis not present

## 2021-03-22 LAB — CUP PACEART REMOTE DEVICE CHECK
Battery Remaining Longevity: 131 mo
Battery Voltage: 3.01 V
Brady Statistic AP VP Percent: 0.01 %
Brady Statistic AP VS Percent: 22.37 %
Brady Statistic AS VP Percent: 0.03 %
Brady Statistic AS VS Percent: 77.59 %
Brady Statistic RA Percent Paced: 22.87 %
Brady Statistic RV Percent Paced: 0.03 %
Date Time Interrogation Session: 20230113133533
Implantable Lead Implant Date: 20190805
Implantable Lead Implant Date: 20190805
Implantable Lead Location: 753859
Implantable Lead Location: 753860
Implantable Lead Model: 5076
Implantable Lead Model: 5076
Implantable Pulse Generator Implant Date: 20190805
Lead Channel Impedance Value: 285 Ohm
Lead Channel Impedance Value: 304 Ohm
Lead Channel Impedance Value: 380 Ohm
Lead Channel Impedance Value: 380 Ohm
Lead Channel Pacing Threshold Amplitude: 0.625 V
Lead Channel Pacing Threshold Amplitude: 0.75 V
Lead Channel Pacing Threshold Pulse Width: 0.4 ms
Lead Channel Pacing Threshold Pulse Width: 0.4 ms
Lead Channel Sensing Intrinsic Amplitude: 3.875 mV
Lead Channel Sensing Intrinsic Amplitude: 3.875 mV
Lead Channel Sensing Intrinsic Amplitude: 4.125 mV
Lead Channel Sensing Intrinsic Amplitude: 4.125 mV
Lead Channel Setting Pacing Amplitude: 2 V
Lead Channel Setting Pacing Amplitude: 2.5 V
Lead Channel Setting Pacing Pulse Width: 0.4 ms
Lead Channel Setting Sensing Sensitivity: 1.2 mV

## 2021-03-29 NOTE — Progress Notes (Signed)
Remote pacemaker transmission.   

## 2021-04-10 ENCOUNTER — Other Ambulatory Visit (HOSPITAL_BASED_OUTPATIENT_CLINIC_OR_DEPARTMENT_OTHER): Payer: Self-pay

## 2021-04-10 MED ORDER — INFLUENZA VAC SPLIT QUAD 0.5 ML IM SUSY
PREFILLED_SYRINGE | INTRAMUSCULAR | 0 refills | Status: DC
  Filled 2021-04-10: qty 0.5, 1d supply, fill #0

## 2021-04-10 MED ORDER — TETANUS-DIPHTH-ACELL PERTUSSIS 5-2.5-18.5 LF-MCG/0.5 IM SUSY
PREFILLED_SYRINGE | INTRAMUSCULAR | 0 refills | Status: DC
  Filled 2021-04-10: qty 0.5, 1d supply, fill #0

## 2021-06-03 ENCOUNTER — Other Ambulatory Visit (HOSPITAL_COMMUNITY): Payer: Self-pay | Admitting: *Deleted

## 2021-06-04 ENCOUNTER — Encounter (HOSPITAL_COMMUNITY)
Admission: RE | Admit: 2021-06-04 | Discharge: 2021-06-04 | Disposition: A | Discharge status: Home / Self Care | Payer: Medicare Other | Source: Ambulatory Visit | Attending: Internal Medicine | Admitting: Internal Medicine

## 2021-06-04 ENCOUNTER — Other Ambulatory Visit: Payer: Self-pay

## 2021-06-04 DIAGNOSIS — I2510 Atherosclerotic heart disease of native coronary artery without angina pectoris: Secondary | ICD-10-CM | POA: Diagnosis not present

## 2021-06-04 DIAGNOSIS — M791 Myalgia, unspecified site: Secondary | ICD-10-CM | POA: Insufficient documentation

## 2021-06-04 DIAGNOSIS — E785 Hyperlipidemia, unspecified: Secondary | ICD-10-CM | POA: Diagnosis not present

## 2021-06-04 MED ORDER — INCLISIRAN SODIUM 284 MG/1.5ML ~~LOC~~ SOSY: 284.0000 mg | PREFILLED_SYRINGE | Freq: Once | SUBCUTANEOUS | Status: AC

## 2021-06-04 MED ORDER — INCLISIRAN SODIUM 284 MG/1.5ML ~~LOC~~ SOSY
PREFILLED_SYRINGE | SUBCUTANEOUS | Status: AC
  Administered 2021-06-04: 284 mg via SUBCUTANEOUS
  Filled 2021-06-04: qty 1.5

## 2021-06-06 DIAGNOSIS — H6123 Impacted cerumen, bilateral: Secondary | ICD-10-CM | POA: Diagnosis not present

## 2021-06-18 ENCOUNTER — Ambulatory Visit (INDEPENDENT_AMBULATORY_CARE_PROVIDER_SITE_OTHER): Payer: Medicare Other

## 2021-06-18 DIAGNOSIS — I495 Sick sinus syndrome: Secondary | ICD-10-CM | POA: Diagnosis not present

## 2021-06-18 LAB — CUP PACEART REMOTE DEVICE CHECK
Battery Remaining Longevity: 128 mo
Battery Voltage: 3.01 V
Brady Statistic AP VP Percent: 0.01 %
Brady Statistic AP VS Percent: 24.2 %
Brady Statistic AS VP Percent: 0.03 %
Brady Statistic AS VS Percent: 75.77 %
Brady Statistic RA Percent Paced: 24.41 %
Brady Statistic RV Percent Paced: 0.03 %
Date Time Interrogation Session: 20230411082611
Implantable Lead Implant Date: 20190805
Implantable Lead Implant Date: 20190805
Implantable Lead Location: 753859
Implantable Lead Location: 753860
Implantable Lead Model: 5076
Implantable Lead Model: 5076
Implantable Pulse Generator Implant Date: 20190805
Lead Channel Impedance Value: 304 Ohm
Lead Channel Impedance Value: 323 Ohm
Lead Channel Impedance Value: 399 Ohm
Lead Channel Impedance Value: 399 Ohm
Lead Channel Pacing Threshold Amplitude: 0.625 V
Lead Channel Pacing Threshold Amplitude: 0.75 V
Lead Channel Pacing Threshold Pulse Width: 0.4 ms
Lead Channel Pacing Threshold Pulse Width: 0.4 ms
Lead Channel Sensing Intrinsic Amplitude: 4 mV
Lead Channel Sensing Intrinsic Amplitude: 4 mV
Lead Channel Sensing Intrinsic Amplitude: 4.125 mV
Lead Channel Sensing Intrinsic Amplitude: 4.125 mV
Lead Channel Setting Pacing Amplitude: 2 V
Lead Channel Setting Pacing Amplitude: 2.5 V
Lead Channel Setting Pacing Pulse Width: 0.4 ms
Lead Channel Setting Sensing Sensitivity: 1.2 mV

## 2021-06-19 ENCOUNTER — Encounter: Payer: Self-pay | Admitting: Cardiology

## 2021-06-25 DIAGNOSIS — D2339 Other benign neoplasm of skin of other parts of face: Secondary | ICD-10-CM | POA: Diagnosis not present

## 2021-06-25 DIAGNOSIS — D492 Neoplasm of unspecified behavior of bone, soft tissue, and skin: Secondary | ICD-10-CM | POA: Diagnosis not present

## 2021-07-05 NOTE — Progress Notes (Signed)
Remote pacemaker transmission.   

## 2021-07-16 ENCOUNTER — Ambulatory Visit: Payer: Medicare Other | Attending: Internal Medicine

## 2021-07-16 ENCOUNTER — Other Ambulatory Visit (HOSPITAL_BASED_OUTPATIENT_CLINIC_OR_DEPARTMENT_OTHER): Payer: Self-pay

## 2021-07-16 DIAGNOSIS — Z23 Encounter for immunization: Secondary | ICD-10-CM

## 2021-07-16 MED ORDER — PFIZER COVID-19 VAC BIVALENT 30 MCG/0.3ML IM SUSP
INTRAMUSCULAR | 0 refills | Status: DC
  Filled 2021-07-16: qty 0.3, 1d supply, fill #0

## 2021-07-16 NOTE — Progress Notes (Signed)
? ?  Covid-19 Vaccination Clinic ? ?Name:  Alexander [name] [facility]    ?MRN: [id number] ?DOB: December 31, 1940 ? ?07/16/2021 ? ?Alexander [name] was observed post Covid-19 immunization for 15 minutes without incident. He was provided with Vaccine Information Sheet and instruction to access the V-Safe system.  ? ?Alexander [name] was instructed to call 911 with any severe reactions post vaccine: ?Difficulty breathing  ?Swelling of face and throat  ?A fast heartbeat  ?A bad rash all over body  ?Dizziness and weakness  ? ?Immunizations Administered   ? ? Name Date Dose VIS Date Route  ? Ambulance person Booster 07/16/2021 10:20 AM 0.3 mL 11/07/2020 Intramuscular  ? Manufacturer: [place]: [id number]  ? [place]: [phone]  ? ?  ? ? ?

## 2021-07-18 DIAGNOSIS — Z0000 Encounter for general adult medical examination without abnormal findings: Secondary | ICD-10-CM | POA: Diagnosis not present

## 2021-07-18 DIAGNOSIS — I480 Paroxysmal atrial fibrillation: Secondary | ICD-10-CM | POA: Diagnosis not present

## 2021-07-18 DIAGNOSIS — R7989 Other specified abnormal findings of blood chemistry: Secondary | ICD-10-CM | POA: Diagnosis not present

## 2021-07-18 DIAGNOSIS — E785 Hyperlipidemia, unspecified: Secondary | ICD-10-CM | POA: Diagnosis not present

## 2021-07-18 DIAGNOSIS — M8589 Other specified disorders of bone density and structure, multiple sites: Secondary | ICD-10-CM | POA: Diagnosis not present

## 2021-07-18 DIAGNOSIS — Z125 Encounter for screening for malignant neoplasm of prostate: Secondary | ICD-10-CM | POA: Diagnosis not present

## 2021-07-18 DIAGNOSIS — I700 Atherosclerosis of aorta: Secondary | ICD-10-CM | POA: Diagnosis not present

## 2021-07-18 DIAGNOSIS — I10 Essential (primary) hypertension: Secondary | ICD-10-CM | POA: Diagnosis not present

## 2021-07-18 DIAGNOSIS — M8580 Other specified disorders of bone density and structure, unspecified site: Secondary | ICD-10-CM | POA: Diagnosis not present

## 2021-07-18 DIAGNOSIS — Z79899 Other long term (current) drug therapy: Secondary | ICD-10-CM | POA: Diagnosis not present

## 2021-07-19 DIAGNOSIS — H6121 Impacted cerumen, right ear: Secondary | ICD-10-CM | POA: Diagnosis not present

## 2021-07-22 DIAGNOSIS — I700 Atherosclerosis of aorta: Secondary | ICD-10-CM | POA: Diagnosis not present

## 2021-07-22 DIAGNOSIS — R972 Elevated prostate specific antigen [PSA]: Secondary | ICD-10-CM | POA: Diagnosis not present

## 2021-07-22 DIAGNOSIS — I4510 Unspecified right bundle-branch block: Secondary | ICD-10-CM | POA: Diagnosis not present

## 2021-07-22 DIAGNOSIS — I77810 Thoracic aortic ectasia: Secondary | ICD-10-CM | POA: Diagnosis not present

## 2021-07-22 DIAGNOSIS — Z1389 Encounter for screening for other disorder: Secondary | ICD-10-CM | POA: Diagnosis not present

## 2021-07-22 DIAGNOSIS — I2510 Atherosclerotic heart disease of native coronary artery without angina pectoris: Secondary | ICD-10-CM | POA: Diagnosis not present

## 2021-07-22 DIAGNOSIS — Z1331 Encounter for screening for depression: Secondary | ICD-10-CM | POA: Diagnosis not present

## 2021-07-22 DIAGNOSIS — R918 Other nonspecific abnormal finding of lung field: Secondary | ICD-10-CM | POA: Diagnosis not present

## 2021-07-22 DIAGNOSIS — E785 Hyperlipidemia, unspecified: Secondary | ICD-10-CM | POA: Diagnosis not present

## 2021-07-22 DIAGNOSIS — Z23 Encounter for immunization: Secondary | ICD-10-CM | POA: Diagnosis not present

## 2021-07-22 DIAGNOSIS — Z0000 Encounter for general adult medical examination without abnormal findings: Secondary | ICD-10-CM | POA: Diagnosis not present

## 2021-07-22 DIAGNOSIS — M8580 Other specified disorders of bone density and structure, unspecified site: Secondary | ICD-10-CM | POA: Diagnosis not present

## 2021-08-13 DIAGNOSIS — H6240 Otitis externa in other diseases classified elsewhere, unspecified ear: Secondary | ICD-10-CM | POA: Diagnosis not present

## 2021-08-13 DIAGNOSIS — B368 Other specified superficial mycoses: Secondary | ICD-10-CM | POA: Diagnosis not present

## 2021-08-29 DIAGNOSIS — Z961 Presence of intraocular lens: Secondary | ICD-10-CM | POA: Diagnosis not present

## 2021-09-11 DIAGNOSIS — M5440 Lumbago with sciatica, unspecified side: Secondary | ICD-10-CM | POA: Diagnosis not present

## 2021-09-17 ENCOUNTER — Ambulatory Visit (INDEPENDENT_AMBULATORY_CARE_PROVIDER_SITE_OTHER): Payer: Medicare Other

## 2021-09-17 DIAGNOSIS — I495 Sick sinus syndrome: Secondary | ICD-10-CM

## 2021-09-17 LAB — CUP PACEART REMOTE DEVICE CHECK
Battery Remaining Longevity: 125 mo
Battery Voltage: 3.01 V
Brady Statistic AP VP Percent: 0.01 %
Brady Statistic AP VS Percent: 30.26 %
Brady Statistic AS VP Percent: 0.03 %
Brady Statistic AS VS Percent: 69.7 %
Brady Statistic RA Percent Paced: 30.41 %
Brady Statistic RV Percent Paced: 0.03 %
Date Time Interrogation Session: 20230710212433
Implantable Lead Implant Date: 20190805
Implantable Lead Implant Date: 20190805
Implantable Lead Location: 753859
Implantable Lead Location: 753860
Implantable Lead Model: 5076
Implantable Lead Model: 5076
Implantable Pulse Generator Implant Date: 20190805
Lead Channel Impedance Value: 304 Ohm
Lead Channel Impedance Value: 323 Ohm
Lead Channel Impedance Value: 399 Ohm
Lead Channel Impedance Value: 418 Ohm
Lead Channel Pacing Threshold Amplitude: 0.625 V
Lead Channel Pacing Threshold Amplitude: 0.75 V
Lead Channel Pacing Threshold Pulse Width: 0.4 ms
Lead Channel Pacing Threshold Pulse Width: 0.4 ms
Lead Channel Sensing Intrinsic Amplitude: 3.375 mV
Lead Channel Sensing Intrinsic Amplitude: 3.375 mV
Lead Channel Sensing Intrinsic Amplitude: 4.5 mV
Lead Channel Sensing Intrinsic Amplitude: 4.5 mV
Lead Channel Setting Pacing Amplitude: 2 V
Lead Channel Setting Pacing Amplitude: 2.5 V
Lead Channel Setting Pacing Pulse Width: 0.4 ms
Lead Channel Setting Sensing Sensitivity: 1.2 mV

## 2021-09-20 DIAGNOSIS — H6122 Impacted cerumen, left ear: Secondary | ICD-10-CM | POA: Diagnosis not present

## 2021-09-20 DIAGNOSIS — H60391 Other infective otitis externa, right ear: Secondary | ICD-10-CM | POA: Diagnosis not present

## 2021-09-23 DIAGNOSIS — H903 Sensorineural hearing loss, bilateral: Secondary | ICD-10-CM | POA: Diagnosis not present

## 2021-10-04 NOTE — Progress Notes (Unsigned)
Cardiology Office Note:    Date:  10/07/2021   ID:  Alexander [name], DOB [birth year], MRN [id number]  PCP:  [name], MD  Cardiologist:  [name], MD    Referring MD: [name], MD   No chief complaint on file.   History of Present Illness:    Alexander [name] is a 81 y.o. male with a history of mitral valve prolapse, remote history of syncope status post permanent pacemaker for sinus node arrest, hypertension, HLD, RBBB coming in today for routine follow-up visit. He is a retired Doctor, general practice.  Per review of the record, the patient underwent mitral valve repair via right minithoracotomy 06/14/2019 by Dr.Owen. He developed post-op atrial fibrillation and was started on amiodarone then converted to normal sinus rhythm. Pre-op cath revealed no evidence of CAD. Normal right and left heart pressures. He had been doing great early post surgery, walked several miles a day and played golf almost on a daily basis. His Coumadin was discontinued after 3 months, was on aspirin only.  His recent echocardiogram on 04/30/2020 shows normal mean gradient of 2 mmHg, there is no mitral regurgitation.  The EF is 50 to 55%.  Was last seen in clinic on 01/2021 where he was doing very well from a CV standpoint.  Today, the patient overall feels well. No chest pain, LE edema, lightheadedness, dizziness, or syncope. Blood pressure is very well controlled. Cholesterol is well controlled. Remains very active without anginal symptoms.   Past Medical History:  Diagnosis Date   Heart murmur    Hyperlipidemia    Hypertension    Mitral regurgitation    MVP (mitral valve prolapse)    Presence of permanent cardiac pacemaker    Snoring 09/07/2019   Syncope     Past Surgical History:  Procedure Laterality Date   BUBBLE STUDY  05/23/2019   Procedure: BUBBLE STUDY;  Surgeon: [name], MD;  Location: [facility];  Service: Cardiovascular;;   HERNIA REPAIR     MITRAL VALVE  REPAIR Right 06/14/2019   Procedure: MINIMALLY INVASIVE MITRAL VALVE REPAIR (MVR)<TEE;  Surgeon: [name], MD;  Location: [facility];  Service: Open Heart Surgery;  Laterality: Right;   PACEMAKER IMPLANT N/A 10/12/2017   Procedure: PACEMAKER IMPLANT;  Surgeon: Constance Haw, MD;  Location: [facility];  Service: Cardiovascular;  Laterality: N/A;   RIGHT/LEFT HEART CATH AND CORONARY ANGIOGRAPHY N/A 06/06/2019   Procedure: RIGHT/LEFT HEART CATH AND CORONARY ANGIOGRAPHY;  Surgeon: Burnell Blanks, MD;  Location: [facility];  Service: Cardiovascular;  Laterality: N/A;   TEE WITHOUT CARDIOVERSION N/A 05/23/2019   Procedure: TRANSESOPHAGEAL ECHOCARDIOGRAM (TEE);  Surgeon: [name], MD;  Location: [facility];  Service: Cardiovascular;  Laterality: N/A;   TEE WITHOUT CARDIOVERSION N/A 06/14/2019   Procedure: TRANSESOPHAGEAL ECHOCARDIOGRAM (TEE);  Surgeon: [name], MD;  Location: [facility];  Service: Open Heart Surgery;  Laterality: N/A;   TONSILECTOMY, ADENOIDECTOMY, BILATERAL MYRINGOTOMY AND TUBES     as child   TONSILLECTOMY     TOTAL KNEE ARTHROPLASTY Left    TOTAL KNEE ARTHROPLASTY Left 02/23/2019   Procedure: TOTAL KNEE ARTHROPLASTY;  Surgeon: Gaynelle Arabian, MD;  Location: [facility];  Service: Orthopedics;  Laterality: Left;  38mn    Current Medications: Current Meds  Medication Sig   aspirin EC 81 MG tablet Take 1 tablet (81 mg total) by mouth daily. Swallow whole.   Evolocumab (REPATHA ) Inject into the skin.   ezetimibe (ZETIA)  10 MG tablet Take 10 mg by mouth daily.   zolpidem (AMBIEN) 10 MG tablet Take 5-10 mg by mouth at bedtime as needed.      Allergies:   Patient has no known allergies.   Social History   Socioeconomic History   Marital status: Married    Spouse name: Not on file   Number of children: Not on file   Years of education: Not on file   Highest education level: Not on file  Occupational History   Not on file  Tobacco  Use   Smoking status: Never   Smokeless tobacco: Never  Vaping Use   Vaping Use: Never used  Substance and Sexual Activity   Alcohol use: Yes    Alcohol/week: 5.0 standard drinks of alcohol    Types: 5 Glasses of wine per week   Drug use: No   Sexual activity: Not on file  Other Topics Concern   Not on file  Social History Narrative   Not on file   Social Determinants of Health   Financial Resource Strain: Not on file  Food Insecurity: Not on file  Transportation Needs: Not on file  Physical Activity: Not on file  Stress: Not on file  Social Connections: Not on file     Family History: The patient's family history includes Alzheimer's disease in his mother; Hypertension in his father.  ROS:   Please see the history of present illness.    Review of Systems  Constitutional: Negative for chills and fever.  HENT:  Negative for congestion and sore throat.   Eyes:  Negative for double vision and pain.  Cardiovascular:  Negative for chest pain, claudication, cyanosis, dyspnea on exertion, irregular heartbeat, leg swelling, near-syncope, orthopnea, palpitations, paroxysmal nocturnal dyspnea and syncope.  Respiratory:  Negative for snoring and wheezing.   Skin:  Negative for dry skin and rash.  Musculoskeletal:  Negative for falls and myalgias.  Gastrointestinal:  Negative for abdominal pain and diarrhea.  Genitourinary:  Negative for dysuria and frequency.  Neurological:  Negative for dizziness and seizures.  Psychiatric/Behavioral:  The patient does not have insomnia.     All other systems reviewed and negative.   EKGs/Labs/Other Studies Reviewed:    The following studies were reviewed today: Echo 04/30/20  1. The mitral valve has been repaired/replaced. No evidence of mitral  valve regurgitation. No evidence of mitral stenosis. The mean mitral valve  gradient is 2.0 mmHg with average heart rate of 68 bpm. There is a 40 mm prosthetic annuloplasty ring present in the mitral  position. Procedure Date: 06/14/2019.   2. Left ventricular ejection fraction, by estimation, is 50 to 55%. The  left ventricle has low normal function. The left ventricle has no regional  wall motion abnormalities. Left ventricular diastolic function could not  be evaluated.   3. Right ventricular systolic function is normal. The right ventricular  size is normal. There is normal pulmonary artery systolic pressure. The  estimated right ventricular systolic pressure is 10.2 mmHg.   4. The aortic valve is tricuspid. Aortic valve regurgitation is not visualized. No aortic stenosis is present.   5. The inferior vena cava is normal in size with greater than 50%  respiratory variability, suggesting right atrial pressure of 3 mmHg.  Comparison(s): No significant change from prior study.   CTA Chest 06/13/19 IMPRESSION: VASCULAR 1. Fairly mild atherosclerotic plaque without evidence of significant stenosis, occlusion, dissection, aneurysm or other acute arterial abnormality. Aortic Atherosclerosis (ICD10-170.0) 2. Mild left ventricular dilatation  history of mitral regurgitation. 3. Bilateral accessory renal arteries. 4. 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 5. Mild ectasia and tortuosity of the bilateral iliac arteries.  NON VASCULAR 1. 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 The duodenum does not cross the midline and the entirety of the colon lies in the left hemiabdomen including the cecum and appendix. 2. Small 4 mm pulmonary nodule in the right lower lobe is new compared to relatively recent prior imaging from January. This almost certainly represents a small focus of active inflammation/infection. No further follow-up required unless the patient is at high risk for pulmonary malignancy. 3. Stable 5 mm left lower lobe benign pulmonary nodule. 4. Small fat containing umbilical hernia.        CARDIOTHORACIC SURGERY OPERATIVE NOTE   Date of Procedure:                 06/14/2019   Preoperative Diagnosis:      Severe Mitral Regurgitation Minimally-Invasive Mitral Valve Repair             Complex valvuloplasty including artificial Gore-tex neochord placement x12             Sorin Memo 4D Ring Annuloplasty (size 14m, catalog # [id number], serial # [id number]           Surgeon:        CValentina Gu ORoxy Manns MD Operative Findings: Barlow's type myxomatous degenerative disease Bileaflet prolapse with multiple elongated chordae tendinae Type II dysfunction with severe mitral regurgitation Normal left ventricular systolic function No residual mitral regurgitation after successful valve repair   Cardiac catheterization 06/06/2019 1. No angiographic evidence of CAD 2. Normal right and left sided pressures   Continue with planning for mitral valve surgery.    TTE: 04/30/2020   1. The mitral valve has been repaired/replaced. No evidence of mitral  valve regurgitation. No evidence of mitral stenosis. The mean mitral valve  gradient is 2.0 mmHg with average heart rate of 68 bpm. There is a 40 mm  prosthetic annuloplasty ring  present in the mitral position. Procedure Date: 06/14/2019.   2. Left ventricular ejection fraction, by estimation, is 50 to 55%. The  left ventricle has low normal function. The left ventricle has no regional  wall motion abnormalities. Left ventricular diastolic function could not  be evaluated.   3. Right ventricular systolic function is normal. The right ventricular  size is normal. There is normal pulmonary artery systolic pressure. The  estimated right ventricular systolic pressure is 216.0mmHg.   4. The aortic valve is tricuspid. Aortic valve regurgitation is not  visualized. No aortic stenosis is present.   5. The inferior vena cava is normal in size with greater than 50%  respiratory variability, suggesting right atrial pressure of 3 mmHg.  EKG:   01/29/21: EKG was not ordered today  Recent Labs: No results found for  requested labs within last 365 days.   Recent Lipid Panel    Component Value Date/Time   CHOL 169 02/10/2019 1429   TRIG 80 02/10/2019 1429   HDL 81 02/10/2019 1429   CHOLHDL 2.1 02/10/2019 1429   LDLCALC 73 02/10/2019 1429    CHA2DS2-VASc Score =   '[ ]'$ .  Therefore, the patient's annual risk of stroke is   %.        Physical Exam:    VS:  BP 122/78   Pulse 73   Ht 5' 9.5" (1.765 m)   Wt 175 lb (79.4 kg)  BMI 25.47 kg/m     Wt Readings from Last 3 Encounters:  10/07/21 175 lb (79.4 kg)  01/29/21 168 lb (76.2 kg)  07/23/20 168 lb (76.2 kg)     GEN:  Well nourished, well developed in no acute distress HEENT: Normal NECK: No JVD; No carotid bruits CARDIAC: RRR, no murmurs RESPIRATORY:  Clear to auscultation without rales, wheezing or rhonchi  ABDOMEN: Soft, non-tender, non-distended MUSCULOSKELETAL:  No edema; No deformity  SKIN: Warm and dry NEUROLOGIC:  Alert and oriented x 3 PSYCHIATRIC:  Normal affect   ASSESSMENT:    1. S/P mitral valve repair   2. S/P MVR (mitral valve repair)   3. 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 [name]. Doing very well with no exercise limitations. His most recent echocardiogram on 04/30/2020 shows normal mean gradient of 2 mmHg, there is no mitral regurgitation.  The EF is 50 to 55%. -Repeat TTE for monitoring next year  #Post-op Afib: Remains in NSR off amiodarone. Not on AC. No palpitations  #Sinus Arrest s/p PPM placement: -Follow-up with EP as scheduled  #HLD: Calcium score was 94 which was 27th percentile but cardiac cath showed minimal CAD. 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 On repatha and zetia. LDL controlled. -Continue repatha per PCP -Continue zetia '10mg'$  daily -Lipids per PCP  Medication Adjustments/Labs and  Tests Ordered: Current medicines are reviewed at length with the patient today.  Concerns regarding medicines are outlined above.  Orders Placed This Encounter  Procedures   EKG 12-Lead   ECHOCARDIOGRAM COMPLETE   No orders of the defined types were placed in this encounter.  Signed, Freada Bergeron, MD  10/07/2021 8:21 AM    [place]

## 2021-10-07 ENCOUNTER — Encounter: Payer: Self-pay | Admitting: Cardiology

## 2021-10-07 ENCOUNTER — Ambulatory Visit (INDEPENDENT_AMBULATORY_CARE_PROVIDER_SITE_OTHER): Payer: Medicare Other | Admitting: Cardiology

## 2021-10-07 VITALS — BP 122/78 | HR 73 | Ht 69.5 in | Wt 175.0 lb

## 2021-10-07 DIAGNOSIS — I480 Paroxysmal atrial fibrillation: Secondary | ICD-10-CM | POA: Diagnosis not present

## 2021-10-07 DIAGNOSIS — I4510 Unspecified right bundle-branch block: Secondary | ICD-10-CM | POA: Diagnosis not present

## 2021-10-07 DIAGNOSIS — I341 Nonrheumatic mitral (valve) prolapse: Secondary | ICD-10-CM

## 2021-10-07 DIAGNOSIS — Z9889 Other specified postprocedural states: Secondary | ICD-10-CM

## 2021-10-07 DIAGNOSIS — E7849 Other hyperlipidemia: Secondary | ICD-10-CM | POA: Diagnosis not present

## 2021-10-07 DIAGNOSIS — I495 Sick sinus syndrome: Secondary | ICD-10-CM

## 2021-10-07 DIAGNOSIS — I455 Other specified heart block: Secondary | ICD-10-CM

## 2021-10-07 DIAGNOSIS — I10 Essential (primary) hypertension: Secondary | ICD-10-CM | POA: Diagnosis not present

## 2021-10-07 NOTE — Patient Instructions (Signed)
Medication Instructions:   Your physician recommends that you continue on your current medications as directed. Please refer to the Current Medication list given to you today.  *If you need a refill on your cardiac medications before your next appointment, please call your pharmacy*   Testing/Procedures:  Your physician has requested that you have an echocardiogram. Echocardiography is a painless test that uses sound waves to create images of your heart. It provides your doctor with information about the size and shape of your heart and how well your heart's chambers and valves are working. This procedure takes approximately one hour. There are no restrictions for this procedure.  SCHEDULE TO BE DONE IN ONE YEAR PRIOR TO ONE YEAR APPOINTMENT WITH DR. Johney Frame     Follow-Up: At [facility], you and your health needs are our priority.  As part of our continuing mission to provide you with exceptional heart care, we have created designated Provider Care Teams.  These Care Teams include your primary Cardiologist (physician) and Advanced Practice Providers (APPs -  Physician Assistants and Nurse Practitioners) who all work together to provide you with the care you need, when you need it.  We recommend signing up for the patient portal called "MyChart".  Sign up information is provided on this After Visit Summary.  MyChart is used to connect with patients for Virtual Visits (Telemedicine).  Patients are able to view lab/test results, encounter notes, upcoming appointments, etc.  Non-urgent messages can be sent to your provider as well.   To learn more about what you can do with MyChart, go to [email or web address].    Your next appointment:   1 year(s)--NEEDS ECHO PRIOR TO THIS APPOINTMENT  The format for your next appointment:   In Person  Provider:   DR. Johney Frame     Important Information About Sugar

## 2021-10-10 NOTE — Progress Notes (Signed)
Remote pacemaker transmission.   

## 2021-10-16 DIAGNOSIS — H903 Sensorineural hearing loss, bilateral: Secondary | ICD-10-CM | POA: Diagnosis not present

## 2021-10-16 DIAGNOSIS — M25841 Other specified joint disorders, right hand: Secondary | ICD-10-CM | POA: Diagnosis not present

## 2021-10-16 DIAGNOSIS — M25541 Pain in joints of right hand: Secondary | ICD-10-CM | POA: Diagnosis not present

## 2021-10-16 DIAGNOSIS — M25831 Other specified joint disorders, right wrist: Secondary | ICD-10-CM | POA: Diagnosis not present

## 2021-10-16 DIAGNOSIS — M19049 Primary osteoarthritis, unspecified hand: Secondary | ICD-10-CM | POA: Diagnosis not present

## 2021-10-31 DIAGNOSIS — H6122 Impacted cerumen, left ear: Secondary | ICD-10-CM | POA: Diagnosis not present

## 2021-10-31 DIAGNOSIS — Z8669 Personal history of other diseases of the nervous system and sense organs: Secondary | ICD-10-CM | POA: Diagnosis not present

## 2021-11-07 ENCOUNTER — Ambulatory Visit (HOSPITAL_COMMUNITY)
Admission: RE | Admit: 2021-11-07 | Discharge: 2021-11-07 | Disposition: A | Discharge status: Home / Self Care | Payer: Medicare Other | Source: Ambulatory Visit | Attending: Internal Medicine | Admitting: Internal Medicine

## 2021-11-07 ENCOUNTER — Other Ambulatory Visit (HOSPITAL_COMMUNITY): Payer: Self-pay | Admitting: *Deleted

## 2021-11-07 DIAGNOSIS — I2510 Atherosclerotic heart disease of native coronary artery without angina pectoris: Secondary | ICD-10-CM | POA: Diagnosis not present

## 2021-11-07 DIAGNOSIS — E785 Hyperlipidemia, unspecified: Secondary | ICD-10-CM | POA: Diagnosis not present

## 2021-11-07 MED ORDER — INCLISIRAN SODIUM 284 MG/1.5ML ~~LOC~~ SOSY
PREFILLED_SYRINGE | SUBCUTANEOUS | Status: AC
  Administered 2021-11-07: 284 mg via SUBCUTANEOUS
  Filled 2021-11-07: qty 1.5

## 2021-11-07 MED ORDER — INCLISIRAN SODIUM 284 MG/1.5ML ~~LOC~~ SOSY: 284.0000 mg | PREFILLED_SYRINGE | Freq: Once | SUBCUTANEOUS | Status: AC

## 2021-11-20 ENCOUNTER — Other Ambulatory Visit (HOSPITAL_BASED_OUTPATIENT_CLINIC_OR_DEPARTMENT_OTHER): Payer: Self-pay

## 2021-11-20 DIAGNOSIS — Z23 Encounter for immunization: Secondary | ICD-10-CM | POA: Diagnosis not present

## 2021-11-20 MED ORDER — INFLUENZA VAC A&B SA ADJ QUAD 0.5 ML IM PRSY
PREFILLED_SYRINGE | INTRAMUSCULAR | 0 refills | Status: DC
  Filled 2021-11-20: qty 0.5, 1d supply, fill #0

## 2021-11-22 ENCOUNTER — Other Ambulatory Visit (HOSPITAL_BASED_OUTPATIENT_CLINIC_OR_DEPARTMENT_OTHER): Payer: Self-pay

## 2021-12-06 ENCOUNTER — Encounter (HOSPITAL_COMMUNITY): Payer: Medicare Other

## 2021-12-16 ENCOUNTER — Encounter (HOSPITAL_COMMUNITY): Payer: Medicare Other

## 2021-12-17 ENCOUNTER — Ambulatory Visit (INDEPENDENT_AMBULATORY_CARE_PROVIDER_SITE_OTHER): Payer: Medicare Other

## 2021-12-17 DIAGNOSIS — I495 Sick sinus syndrome: Secondary | ICD-10-CM | POA: Diagnosis not present

## 2021-12-20 LAB — CUP PACEART REMOTE DEVICE CHECK
Battery Remaining Longevity: 121 mo
Battery Voltage: 3 V
Brady Statistic AP VP Percent: 0.01 %
Brady Statistic AP VS Percent: 29.4 %
Brady Statistic AS VP Percent: 0.03 %
Brady Statistic AS VS Percent: 70.56 %
Brady Statistic RA Percent Paced: 29.7 %
Brady Statistic RV Percent Paced: 0.04 %
Date Time Interrogation Session: 20231012215456
Implantable Lead Implant Date: 20190805
Implantable Lead Implant Date: 20190805
Implantable Lead Location: 753859
Implantable Lead Location: 753860
Implantable Lead Model: 5076
Implantable Lead Model: 5076
Implantable Pulse Generator Implant Date: 20190805
Lead Channel Impedance Value: 304 Ohm
Lead Channel Impedance Value: 323 Ohm
Lead Channel Impedance Value: 361 Ohm
Lead Channel Impedance Value: 399 Ohm
Lead Channel Pacing Threshold Amplitude: 0.625 V
Lead Channel Pacing Threshold Amplitude: 0.625 V
Lead Channel Pacing Threshold Pulse Width: 0.4 ms
Lead Channel Pacing Threshold Pulse Width: 0.4 ms
Lead Channel Sensing Intrinsic Amplitude: 4.125 mV
Lead Channel Sensing Intrinsic Amplitude: 4.125 mV
Lead Channel Sensing Intrinsic Amplitude: 4.25 mV
Lead Channel Sensing Intrinsic Amplitude: 4.25 mV
Lead Channel Setting Pacing Amplitude: 2 V
Lead Channel Setting Pacing Amplitude: 2.5 V
Lead Channel Setting Pacing Pulse Width: 0.4 ms
Lead Channel Setting Sensing Sensitivity: 1.2 mV

## 2021-12-30 ENCOUNTER — Encounter: Payer: Self-pay | Admitting: Cardiology

## 2021-12-30 ENCOUNTER — Ambulatory Visit: Payer: Medicare Other | Attending: Cardiology | Admitting: Cardiology

## 2021-12-30 VITALS — BP 108/62 | HR 73 | Ht 69.5 in | Wt 172.0 lb

## 2021-12-30 DIAGNOSIS — Z950 Presence of cardiac pacemaker: Secondary | ICD-10-CM | POA: Diagnosis not present

## 2021-12-30 DIAGNOSIS — I10 Essential (primary) hypertension: Secondary | ICD-10-CM

## 2021-12-30 DIAGNOSIS — I495 Sick sinus syndrome: Secondary | ICD-10-CM | POA: Diagnosis not present

## 2021-12-30 LAB — CUP PACEART INCLINIC DEVICE CHECK
Battery Remaining Longevity: 122 mo
Battery Voltage: 3 V
Brady Statistic AP VP Percent: 0.01 %
Brady Statistic AP VS Percent: 24.34 %
Brady Statistic AS VP Percent: 0.03 %
Brady Statistic AS VS Percent: 75.62 %
Brady Statistic RA Percent Paced: 24.61 %
Brady Statistic RV Percent Paced: 0.04 %
Date Time Interrogation Session: 20231023153237
Implantable Lead Implant Date: 20190805
Implantable Lead Implant Date: 20190805
Implantable Lead Location: 753859
Implantable Lead Location: 753860
Implantable Lead Model: 5076
Implantable Lead Model: 5076
Implantable Pulse Generator Implant Date: 20190805
Lead Channel Impedance Value: 323 Ohm
Lead Channel Impedance Value: 361 Ohm
Lead Channel Impedance Value: 399 Ohm
Lead Channel Impedance Value: 437 Ohm
Lead Channel Pacing Threshold Amplitude: 0.75 V
Lead Channel Pacing Threshold Amplitude: 0.75 V
Lead Channel Pacing Threshold Pulse Width: 0.4 ms
Lead Channel Pacing Threshold Pulse Width: 0.4 ms
Lead Channel Sensing Intrinsic Amplitude: 4.125 mV
Lead Channel Sensing Intrinsic Amplitude: 4.125 mV
Lead Channel Sensing Intrinsic Amplitude: 4.25 mV
Lead Channel Sensing Intrinsic Amplitude: 5.375 mV
Lead Channel Setting Pacing Amplitude: 2 V
Lead Channel Setting Pacing Amplitude: 2.5 V
Lead Channel Setting Pacing Pulse Width: 0.4 ms
Lead Channel Setting Sensing Sensitivity: 1.2 mV

## 2021-12-30 NOTE — Progress Notes (Signed)
Electrophysiology Office Note   Date:  12/30/2021   ID:  Dell, Briner 1940-03-20, MRN [id number]  PCP:  [name], MD  Cardiologist:  [name]  Primary Electrophysiologist:  Oluwatomisin Deman Meredith Leeds, MD    No chief complaint on file.    History of Present Illness: Alexander [name] is a 81 y.o. male who is being seen today for the evaluation of sinus arrest at the request of [name], MD. Presenting today for electrophysiology evaluation.    Is a history significant for hypertension, hyperlipidemia, mitral regurgitation post annuloplasty ring 06/14/2019, syncope.  In the hospital after his episode of syncope was noted to have sinus arrest.  He is status post Medtronic dual-chamber pacemaker implanted 10/12/2017.  After surgery he developed atrial fibrillation and was started on amiodarone.  He converted to sinus rhythm.  He had no further episodes of atrial fibrillation and is off anticoagulation and antiarrhythmics.  Today, denies symptoms of palpitations, chest pain, shortness of breath, orthopnea, PND, lower extremity edema, claudication, dizziness, presyncope, syncope, bleeding, or neurologic sequela. The patient is tolerating medications without difficulties.  Since last being seen he has done well.  He has no chest pain or shortness of breath.  He is able to do all of his daily activities.    Past Medical History:  Diagnosis Date   Heart murmur    Hyperlipidemia    Hypertension    Mitral regurgitation    MVP (mitral valve prolapse)    Presence of permanent cardiac pacemaker    Snoring 09/07/2019   Syncope    Past Surgical History:  Procedure Laterality Date   BUBBLE STUDY  05/23/2019   Procedure: BUBBLE STUDY;  Surgeon: [name], MD;  Location: [facility];  Service: Cardiovascular;;   HERNIA REPAIR     MITRAL VALVE REPAIR Right 06/14/2019   Procedure: MINIMALLY INVASIVE MITRAL VALVE REPAIR (MVR)<TEE;  Surgeon: [name], MD;  Location:  [facility];  Service: Open Heart Surgery;  Laterality: Right;   PACEMAKER IMPLANT N/A 10/12/2017   Procedure: PACEMAKER IMPLANT;  Surgeon: Constance Haw, MD;  Location: [facility];  Service: Cardiovascular;  Laterality: N/A;   RIGHT/LEFT HEART CATH AND CORONARY ANGIOGRAPHY N/A 06/06/2019   Procedure: RIGHT/LEFT HEART CATH AND CORONARY ANGIOGRAPHY;  Surgeon: Burnell Blanks, MD;  Location: [facility];  Service: Cardiovascular;  Laterality: N/A;   TEE WITHOUT CARDIOVERSION N/A 05/23/2019   Procedure: TRANSESOPHAGEAL ECHOCARDIOGRAM (TEE);  Surgeon: [name], MD;  Location: [facility];  Service: Cardiovascular;  Laterality: N/A;   TEE WITHOUT CARDIOVERSION N/A 06/14/2019   Procedure: TRANSESOPHAGEAL ECHOCARDIOGRAM (TEE);  Surgeon: [name], MD;  Location: [facility];  Service: Open Heart Surgery;  Laterality: N/A;   TONSILECTOMY, ADENOIDECTOMY, BILATERAL MYRINGOTOMY AND TUBES     as child   TONSILLECTOMY     TOTAL KNEE ARTHROPLASTY Left    TOTAL KNEE ARTHROPLASTY Left 02/23/2019   Procedure: TOTAL KNEE ARTHROPLASTY;  Surgeon: Gaynelle Arabian, MD;  Location: [facility];  Service: Orthopedics;  Laterality: Left;  35mn     Current Outpatient Medications  Medication Sig Dispense Refill   aspirin EC 81 MG tablet Take 1 tablet (81 mg total) by mouth daily. Swallow whole. 90 tablet 3   Evolocumab (REPATHA [place]) Inject into the skin.     ezetimibe (ZETIA) 10 MG tablet Take 10 mg by mouth daily.     influenza vac split quadrivalent PF (FLUARIX) 0.5 ML injection Inject into the muscle.  0.5 mL 0   influenza vaccine adjuvanted (FLUAD) 0.5 ML injection Inject into the muscle. 0.5 mL 0   influenza vaccine adjuvanted (FLUAD) 0.5 ML injection Inject into the muscle. 0.5 mL 0   Tdap (BOOSTRIX) 5-2.5-18.5 LF-MCG/0.5 injection Inject into the muscle. 0.5 mL 0   zolpidem (AMBIEN) 10 MG tablet Take 5-10 mg by mouth at bedtime as needed.      No current facility-administered  medications for this visit.    Allergies:   Patient has no known allergies.   Social History:  The patient  reports that he has never smoked. He has never used smokeless tobacco. He reports current alcohol use of about 5.0 standard drinks of alcohol per week. He reports that he does not use drugs.   Family History:  The patient's family history includes Alzheimer's disease in his mother; Hypertension in his father.   ROS:  Please see the history of present illness.   Otherwise, review of systems is positive for none.   All other systems are reviewed and negative.   PHYSICAL EXAM: VS:  BP 108/62   Pulse 73   Ht 5' 9.5" (1.765 m)   Wt 172 lb (78 kg)   SpO2 96%   BMI 25.04 kg/m  , BMI Body mass index is 25.04 kg/m. GEN: Well nourished, well developed, in no acute distress  HEENT: normal  Neck: no JVD, carotid bruits, or masses Cardiac: RRR; no murmurs, rubs, or gallops,no edema  Respiratory:  clear to auscultation bilaterally, normal work of breathing GI: soft, nontender, nondistended, + BS MS: no deformity or atrophy  Skin: warm and dry, device site well healed Neuro:  Strength and sensation are intact Psych: euthymic mood, full affect  EKG:  EKG is ordered today. Personal review of the ekg ordered 10/07/21 shows SR, RBBB  Personal review of the device interrogation today. Results in [place]: No results found for requested labs within last 365 days.    Lipid Panel     Component Value Date/Time   CHOL 169 02/10/2019 1429   TRIG 80 02/10/2019 1429   HDL 81 02/10/2019 1429   CHOLHDL 2.1 02/10/2019 1429   LDLCALC 73 02/10/2019 1429     Wt Readings from Last 3 Encounters:  12/30/21 172 lb (78 kg)  10/07/21 175 lb (79.4 kg)  01/29/21 168 lb (76.2 kg)      Other studies Reviewed: Additional studies/ records that were reviewed today include: TTE 03/14/2019 Review of the above records today demonstrates:   1. Left ventricular ejection fraction, by visual  estimation, is 55 to 60%. There is no increased left ventricular wall thickness.  2. Left ventricular diastolic parameters are consistent with Grade I diastolic dysfunction (impaired relaxation).  3. Mild to moderately dilated left ventricular internal cavity size.  4. Global right ventricle has normal systolc function.The right ventricular size is normal. no increase in right ventricular wall thickness.  5. Moderate mitral valve prolapse.  6. Moderate thickening of the mitral valve leaflet(s).  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  8. Normal pulmonary artery systolic pressure.  9. The inferior vena cava is normal in size with greater than 50% respiratory variability, suggesting right atrial pressure of 3 mmHg. 10. A prior study was performed on 02/10/2019. 11. No significant change from prior study. 12. Prior echo: LVEF 55-60%, moderate MR with MVP.   ASSESSMENT AND PLAN:  1.  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  Device function appropriately.  No changes.  2.  Hypertension: Well-controlled  3.  Hyperlipidemia: Continue Crestor  4.  Mitral regurgitation: Stable on most recent echo.  Post annuloplasty ring.  Plan per primary cardiology.  5.  Postoperative atrial fibrillation.  No further episodes.  No anticoagulation or antiarrhythmics.   Current medicines are reviewed at length with the patient today.   The patient does not have concerns regarding his medicines.  The following changes were made today: None  Labs/ tests ordered today include:  No orders of the defined types were placed in this encounter.    Disposition:   FU with Vincie Linn 12 months  Signed, Nylah Butkus Meredith Leeds, MD  12/30/2021 3:18 PM     [place] [address] [phone] (office) [phone] (fax)

## 2021-12-30 NOTE — Patient Instructions (Signed)
Medication Instructions:  Your physician recommends that you continue on your current medications as directed. Please refer to the Current Medication list given to you today.  *If you need a refill on your cardiac medications before your next appointment, please call your pharmacy*   Lab Work: None ordered   Testing/Procedures: None ordered   Follow-Up: At [facility], you and your health needs are our priority.  As part of our continuing mission to provide you with exceptional heart care, we have created designated Provider Care Teams.  These Care Teams include your primary Cardiologist (physician) and Advanced Practice Providers (APPs -  Physician Assistants and Nurse Practitioners) who all work together to provide you with the care you need, when you need it.  Remote monitoring is used to monitor your Pacemaker or ICD from home. This monitoring reduces the number of office visits required to check your device to one time per year. It allows [place] to keep an eye on the functioning of your device to ensure it is working properly. You are scheduled for a device check from home on 03/18/2022. You may send your transmission at any time that day. If you have a wireless device, the transmission will be sent automatically. After your physician reviews your transmission, you will receive a postcard with your next transmission date.  Your next appointment:   1 year(s)  The format for your next appointment:   In Person  Provider:   Allegra Lai, MD    Thank you for choosing [facility]!!   Trinidad Curet, RN [phone]    Other Instructions  Important Information About Sugar

## 2021-12-31 NOTE — Progress Notes (Signed)
Remote pacemaker transmission.   

## 2022-02-14 ENCOUNTER — Other Ambulatory Visit (HOSPITAL_BASED_OUTPATIENT_CLINIC_OR_DEPARTMENT_OTHER): Payer: Self-pay

## 2022-02-14 MED ORDER — AREXVY 120 MCG/0.5ML IM SUSR
INTRAMUSCULAR | 0 refills | Status: DC
  Filled 2022-02-14: qty 0.5, 1d supply, fill #0

## 2022-03-18 ENCOUNTER — Ambulatory Visit (INDEPENDENT_AMBULATORY_CARE_PROVIDER_SITE_OTHER): Payer: Medicare Other

## 2022-03-18 ENCOUNTER — Telehealth: Payer: Self-pay

## 2022-03-18 DIAGNOSIS — I495 Sick sinus syndrome: Secondary | ICD-10-CM | POA: Diagnosis not present

## 2022-03-18 NOTE — Telephone Encounter (Signed)
I told the patient to come by the office to get help with his phone app.

## 2022-03-19 LAB — CUP PACEART REMOTE DEVICE CHECK
Battery Remaining Longevity: 119 mo
Battery Voltage: 3 V
Brady Statistic AP VP Percent: 0.02 %
Brady Statistic AP VS Percent: 26.87 %
Brady Statistic AS VP Percent: 0.03 %
Brady Statistic AS VS Percent: 73.09 %
Brady Statistic RA Percent Paced: 27.53 %
Brady Statistic RV Percent Paced: 0.05 %
Date Time Interrogation Session: 20240110112031
Implantable Lead Connection Status: 753985
Implantable Lead Connection Status: 753985
Implantable Lead Implant Date: 20190805
Implantable Lead Implant Date: 20190805
Implantable Lead Location: 753859
Implantable Lead Location: 753860
Implantable Lead Model: 5076
Implantable Lead Model: 5076
Implantable Pulse Generator Implant Date: 20190805
Lead Channel Impedance Value: 285 Ohm
Lead Channel Impedance Value: 323 Ohm
Lead Channel Impedance Value: 380 Ohm
Lead Channel Impedance Value: 418 Ohm
Lead Channel Pacing Threshold Amplitude: 0.75 V
Lead Channel Pacing Threshold Amplitude: 0.75 V
Lead Channel Pacing Threshold Pulse Width: 0.4 ms
Lead Channel Pacing Threshold Pulse Width: 0.4 ms
Lead Channel Sensing Intrinsic Amplitude: 3.875 mV
Lead Channel Sensing Intrinsic Amplitude: 3.875 mV
Lead Channel Sensing Intrinsic Amplitude: 4.125 mV
Lead Channel Sensing Intrinsic Amplitude: 4.125 mV
Lead Channel Setting Pacing Amplitude: 2 V
Lead Channel Setting Pacing Amplitude: 2.5 V
Lead Channel Setting Pacing Pulse Width: 0.4 ms
Lead Channel Setting Sensing Sensitivity: 1.2 mV
Zone Setting Status: 755011
Zone Setting Status: 755011

## 2022-03-25 DIAGNOSIS — H6121 Impacted cerumen, right ear: Secondary | ICD-10-CM | POA: Diagnosis not present

## 2022-04-03 ENCOUNTER — Encounter: Payer: Self-pay | Admitting: Gastroenterology

## 2022-04-11 NOTE — Progress Notes (Signed)
Remote pacemaker transmission.   

## 2022-05-06 DIAGNOSIS — H903 Sensorineural hearing loss, bilateral: Secondary | ICD-10-CM | POA: Diagnosis not present

## 2022-05-07 ENCOUNTER — Other Ambulatory Visit (HOSPITAL_COMMUNITY): Payer: Self-pay | Admitting: *Deleted

## 2022-05-08 ENCOUNTER — Encounter (HOSPITAL_COMMUNITY)
Admission: RE | Admit: 2022-05-08 | Discharge: 2022-05-08 | Disposition: A | Discharge status: Home / Self Care | Payer: Medicare Other | Source: Ambulatory Visit | Attending: Internal Medicine | Admitting: Internal Medicine

## 2022-05-08 DIAGNOSIS — I2510 Atherosclerotic heart disease of native coronary artery without angina pectoris: Secondary | ICD-10-CM | POA: Insufficient documentation

## 2022-05-08 DIAGNOSIS — E785 Hyperlipidemia, unspecified: Secondary | ICD-10-CM | POA: Diagnosis not present

## 2022-05-08 MED ORDER — INCLISIRAN SODIUM 284 MG/1.5ML ~~LOC~~ SOSY
284.0000 mg | PREFILLED_SYRINGE | Freq: Once | SUBCUTANEOUS | Status: AC
  Administered 2022-05-08: 284 mg via SUBCUTANEOUS

## 2022-05-08 MED ORDER — INCLISIRAN SODIUM 284 MG/1.5ML ~~LOC~~ SOSY
PREFILLED_SYRINGE | SUBCUTANEOUS | Status: AC
Start: 2022-05-08 — End: 2022-05-08
  Filled 2022-05-08: qty 1.5

## 2022-06-02 ENCOUNTER — Other Ambulatory Visit (HOSPITAL_BASED_OUTPATIENT_CLINIC_OR_DEPARTMENT_OTHER): Payer: Self-pay

## 2022-06-03 ENCOUNTER — Other Ambulatory Visit (HOSPITAL_BASED_OUTPATIENT_CLINIC_OR_DEPARTMENT_OTHER): Payer: Self-pay

## 2022-06-03 DIAGNOSIS — Z23 Encounter for immunization: Secondary | ICD-10-CM | POA: Diagnosis not present

## 2022-06-03 MED ORDER — COMIRNATY 30 MCG/0.3ML IM SUSY
PREFILLED_SYRINGE | INTRAMUSCULAR | 0 refills | Status: DC
  Filled 2022-06-03: qty 0.3, 1d supply, fill #0

## 2022-06-05 ENCOUNTER — Other Ambulatory Visit (HOSPITAL_BASED_OUTPATIENT_CLINIC_OR_DEPARTMENT_OTHER): Payer: Self-pay

## 2022-06-10 ENCOUNTER — Telehealth: Payer: Self-pay | Admitting: Cardiology

## 2022-06-10 NOTE — Telephone Encounter (Signed)
Pt states that yesterday he became dizzy and fell to the ground. Pt says that he did not pass out but is concerned. He would like for his monitor to be looked at to see if anything was going on. Pt would also like a callback from nurse as well. Please advise

## 2022-06-10 NOTE — Telephone Encounter (Signed)
  Per Dr. Johney Frame, Sounds like he may have had orthostatic presyncopal event given his symptoms. Does not look like he is on any blood pressure medications. Agree with PCP follow-up. Ensure he stays hydrated as well.   Called patient back with Dr. Jacolyn Reedy recommendations. Patient verbalized understanding and would like to schedule the next available visit with Dr. Johney Frame. Made patient an appointment in April.

## 2022-06-10 NOTE — Telephone Encounter (Signed)
Pt reported playing golf, suddenly felt dizzy and had near syncopal event. Patient reports he was able to lay on the ground and put feet above head. Lasted about 2 minutes in duration. Denies chest pain or shortness of breath. Reports after episode he felt fine and was not overheated. Reports compliance with medications including no missed doses.   Remote transmission reviewed. Normal device function. Presenting rhythm ~ SR 65 bpm. No events logged. Advised I will forward back to general cardiology and someone will follow up. Appreciative of call.   Patient of Dr. Johney Frame.

## 2022-06-10 NOTE — Telephone Encounter (Signed)
Called patient to let him know his message would be sent to both Dr. Curt Bears and Dr. Johney Frame to see if they have any advisement. Encouraged patient to call his PCP to rule out other causes of his symptoms that he had yesterday.

## 2022-06-16 DIAGNOSIS — H6121 Impacted cerumen, right ear: Secondary | ICD-10-CM | POA: Diagnosis not present

## 2022-06-16 DIAGNOSIS — H903 Sensorineural hearing loss, bilateral: Secondary | ICD-10-CM | POA: Diagnosis not present

## 2022-06-17 ENCOUNTER — Ambulatory Visit (INDEPENDENT_AMBULATORY_CARE_PROVIDER_SITE_OTHER): Payer: Medicare Other

## 2022-06-17 ENCOUNTER — Other Ambulatory Visit (HOSPITAL_BASED_OUTPATIENT_CLINIC_OR_DEPARTMENT_OTHER): Payer: Self-pay

## 2022-06-17 DIAGNOSIS — I495 Sick sinus syndrome: Secondary | ICD-10-CM | POA: Diagnosis not present

## 2022-06-17 DIAGNOSIS — Z23 Encounter for immunization: Secondary | ICD-10-CM | POA: Diagnosis not present

## 2022-06-17 MED ORDER — PREVNAR 20 0.5 ML IM SUSY
0.5000 mL | PREFILLED_SYRINGE | INTRAMUSCULAR | 0 refills | Status: DC
  Filled 2022-06-17: qty 0.5, 1d supply, fill #0

## 2022-06-18 LAB — CUP PACEART REMOTE DEVICE CHECK
Battery Remaining Longevity: 118 mo
Battery Voltage: 3 V
Brady Statistic AP VP Percent: 0.01 %
Brady Statistic AP VS Percent: 33.38 %
Brady Statistic AS VP Percent: 0.03 %
Brady Statistic AS VS Percent: 66.58 %
Brady Statistic RA Percent Paced: 34.1 %
Brady Statistic RV Percent Paced: 0.04 %
Date Time Interrogation Session: 20240408214026
Implantable Lead Connection Status: 753985
Implantable Lead Connection Status: 753985
Implantable Lead Implant Date: 20190805
Implantable Lead Implant Date: 20190805
Implantable Lead Location: 753859
Implantable Lead Location: 753860
Implantable Lead Model: 5076
Implantable Lead Model: 5076
Implantable Pulse Generator Implant Date: 20190805
Lead Channel Impedance Value: 304 Ohm
Lead Channel Impedance Value: 323 Ohm
Lead Channel Impedance Value: 418 Ohm
Lead Channel Impedance Value: 418 Ohm
Lead Channel Pacing Threshold Amplitude: 0.75 V
Lead Channel Pacing Threshold Amplitude: 0.75 V
Lead Channel Pacing Threshold Pulse Width: 0.4 ms
Lead Channel Pacing Threshold Pulse Width: 0.4 ms
Lead Channel Sensing Intrinsic Amplitude: 4 mV
Lead Channel Sensing Intrinsic Amplitude: 4 mV
Lead Channel Sensing Intrinsic Amplitude: 4.25 mV
Lead Channel Sensing Intrinsic Amplitude: 4.25 mV
Lead Channel Setting Pacing Amplitude: 2 V
Lead Channel Setting Pacing Amplitude: 2.5 V
Lead Channel Setting Pacing Pulse Width: 0.4 ms
Lead Channel Setting Sensing Sensitivity: 1.2 mV
Zone Setting Status: 755011
Zone Setting Status: 755011

## 2022-06-24 NOTE — Progress Notes (Deleted)
Cardiology Office Note:    Date:  06/24/2022   ID:  Alexander [name], DOB 1940/07/03, MRN [id number]  PCP:  Rodrigo Ran, MD  Cardiologist:  Tobias Alexander, MD    Referring MD: Rodrigo Ran, MD   No chief complaint on file.   History of Present Illness:    Alexander [name] is a 82 y.o. male with a history of mitral valve prolapse, remote history of syncope status post permanent pacemaker for sinus node arrest, hypertension, HLD, RBBB coming in today for routine follow-up visit. He is a retired Investment banker, operational.  Per review of the record, the patient underwent mitral valve repair via right minithoracotomy 06/14/2019 by Dr.Owen. He developed post-op atrial fibrillation and was started on amiodarone then converted to normal sinus rhythm. Pre-op cath revealed no evidence of CAD. Normal right and left heart pressures. He had been doing great early post surgery, walked several miles a day and played golf almost on a daily basis. His Coumadin was discontinued after 3 months, was on aspirin only.  TTE 04/30/2020 shows normal mean gradient of 2 mmHg, there is no mitral regurgitation.  The EF is 50 to 55%.  Was last seen in clinic on 09/2021 where he was doing well from a CV standpoint.  Today, ***  Past Medical History:  Diagnosis Date   Heart murmur    Hyperlipidemia    Hypertension    Mitral regurgitation    MVP (mitral valve prolapse)    Presence of permanent cardiac pacemaker    Snoring 09/07/2019   Syncope     Past Surgical History:  Procedure Laterality Date   BUBBLE STUDY  05/23/2019   Procedure: BUBBLE STUDY;  Surgeon: Lars Masson, MD;  Location: [facility] ENDOSCOPY;  Service: Cardiovascular;;   HERNIA REPAIR     MITRAL VALVE REPAIR Right 06/14/2019   Procedure: MINIMALLY INVASIVE MITRAL VALVE REPAIR (MVR)<TEE;  Surgeon: Purcell Nails, MD;  Location: [facility] OR;  Service: Open Heart Surgery;  Laterality: Right;   PACEMAKER IMPLANT N/A 10/12/2017   Procedure: PACEMAKER  IMPLANT;  Surgeon: Regan Lemming, MD;  Location: [facility] INVASIVE CV LAB;  Service: Cardiovascular;  Laterality: N/A;   RIGHT/LEFT HEART CATH AND CORONARY ANGIOGRAPHY N/A 06/06/2019   Procedure: RIGHT/LEFT HEART CATH AND CORONARY ANGIOGRAPHY;  Surgeon: Kathleene Hazel, MD;  Location: [facility] INVASIVE CV LAB;  Service: Cardiovascular;  Laterality: N/A;   TEE WITHOUT CARDIOVERSION N/A 05/23/2019   Procedure: TRANSESOPHAGEAL ECHOCARDIOGRAM (TEE);  Surgeon: Lars Masson, MD;  Location: [facility] ENDOSCOPY;  Service: Cardiovascular;  Laterality: N/A;   TEE WITHOUT CARDIOVERSION N/A 06/14/2019   Procedure: TRANSESOPHAGEAL ECHOCARDIOGRAM (TEE);  Surgeon: Purcell Nails, MD;  Location: [facility] OR;  Service: Open Heart Surgery;  Laterality: N/A;   TONSILECTOMY, ADENOIDECTOMY, BILATERAL MYRINGOTOMY AND TUBES     as child   TONSILLECTOMY     TOTAL KNEE ARTHROPLASTY Left    TOTAL KNEE ARTHROPLASTY Left 02/23/2019   Procedure: TOTAL KNEE ARTHROPLASTY;  Surgeon: Ollen Gross, MD;  Location: [facility] ORS;  Service: Orthopedics;  Laterality: Left;     Current Medications: No outpatient medications have been marked as taking for the 06/26/22 encounter (Appointment) with Meriam Sprague, MD.     Allergies:   Patient has no known allergies.   Social History   Socioeconomic History   Marital status: Married    Spouse name: Not on file   Number of children: Not on file   Years of education: Not on file   Highest  education level: Not on file  Occupational History   Not on file  Tobacco Use   Smoking status: Never   Smokeless tobacco: Never  Vaping Use   Vaping Use: Never used  Substance and Sexual Activity   Alcohol use: Yes    Alcohol/week: 5.0 standard drinks of alcohol    Types: 5 Glasses of wine per week   Drug use: No   Sexual activity: Not on file  Other Topics Concern   Not on file  Social History Narrative   Not on file   Social Determinants of Health   Financial Resource  Strain: Not on file  Food Insecurity: Not on file  Transportation Needs: Not on file  Physical Activity: Not on file  Stress: Not on file  Social Connections: Not on file     Family History: The patient's family history includes Alzheimer's disease in his mother; Hypertension in his father.  ROS:   Please see the history of present illness.    Review of Systems  Constitutional: Negative for chills and fever.  HENT:  Negative for congestion and sore throat.   Eyes:  Negative for double vision and pain.  Cardiovascular:  Negative for chest pain, claudication, cyanosis, dyspnea on exertion, irregular heartbeat, leg swelling, near-syncope, orthopnea, palpitations, paroxysmal nocturnal dyspnea and syncope.  Respiratory:  Negative for snoring and wheezing.   Skin:  Negative for dry skin and rash.  Musculoskeletal:  Negative for falls and myalgias.  Gastrointestinal:  Negative for abdominal pain and diarrhea.  Genitourinary:  Negative for dysuria and frequency.  Neurological:  Negative for dizziness and seizures.  Psychiatric/Behavioral:  The patient does not have insomnia.     All other systems reviewed and negative.   EKGs/Labs/Other Studies Reviewed:    The following studies were reviewed today: Echo 04/30/20  1. The mitral valve has been repaired/replaced. No evidence of mitral  valve regurgitation. No evidence of mitral stenosis. The mean mitral valve  gradient is 2.0 mmHg with average heart rate of 68 bpm. There is a 40 mm prosthetic annuloplasty ring present in the mitral position. Procedure Date: 06/14/2019.   2. Left ventricular ejection fraction, by estimation, is 50 to 55%. The  left ventricle has low normal function. The left ventricle has no regional  wall motion abnormalities. Left ventricular diastolic function could not  be evaluated.   3. Right ventricular systolic function is normal. The right ventricular  size is normal. There is normal pulmonary artery systolic  pressure. The  estimated right ventricular systolic pressure is 22.2 mmHg.   4. The aortic valve is tricuspid. Aortic valve regurgitation is not visualized. No aortic stenosis is present.   5. The inferior vena cava is normal in size with greater than 50%  respiratory variability, suggesting right atrial pressure of 3 mmHg.  Comparison(s): No significant change from prior study.   CTA Chest 06/13/19 IMPRESSION: VASCULAR 1. Fairly mild atherosclerotic plaque without evidence of significant stenosis, occlusion, dissection, aneurysm or other acute arterial abnormality. Aortic Atherosclerosis (ICD10-170.0) 2. Mild left ventricular dilatation history of mitral regurgitation. 3. Bilateral accessory renal arteries. 4. 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 5. Mild ectasia and tortuosity of the bilateral iliac arteries.  NON VASCULAR 1. 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 The duodenum does not cross the midline and the entirety of the colon lies in the left hemiabdomen including the cecum and appendix. 2. Small 4 mm pulmonary nodule in the right lower lobe is new compared to relatively  recent prior imaging from January. This almost certainly represents a small focus of active inflammation/infection. No further follow-up required unless the patient is at high risk for pulmonary malignancy. 3. Stable 5 mm left lower lobe benign pulmonary nodule. 4. Small fat containing umbilical hernia.        CARDIOTHORACIC SURGERY OPERATIVE NOTE   Date of Procedure:                06/14/2019   Preoperative Diagnosis:      Severe Mitral Regurgitation Minimally-Invasive Mitral Valve Repair             Complex valvuloplasty including artificial Gore-tex neochord placement x12             Sorin Memo 4D Ring Annuloplasty (size 40mm, catalog # [id number], serial # [id number])           Surgeon:        Salvatore Decent. Cornelius Moras, MD Operative Findings: Barlow's type myxomatous degenerative  disease Bileaflet prolapse with multiple elongated chordae tendinae Type II dysfunction with severe mitral regurgitation Normal left ventricular systolic function No residual mitral regurgitation after successful valve repair   Cardiac catheterization 06/06/2019 1. No angiographic evidence of CAD 2. Normal right and left sided pressures   Continue with planning for mitral valve surgery.    TTE: 04/30/2020   1. The mitral valve has been repaired/replaced. No evidence of mitral  valve regurgitation. No evidence of mitral stenosis. The mean mitral valve  gradient is 2.0 mmHg with average heart rate of 68 bpm. There is a 40 mm  prosthetic annuloplasty ring  present in the mitral position. Procedure Date: 06/14/2019.   2. Left ventricular ejection fraction, by estimation, is 50 to 55%. The  left ventricle has low normal function. The left ventricle has no regional  wall motion abnormalities. Left ventricular diastolic function could not  be evaluated.   3. Right ventricular systolic function is normal. The right ventricular  size is normal. There is normal pulmonary artery systolic pressure. The  estimated right ventricular systolic pressure is 22.2 mmHg.   4. The aortic valve is tricuspid. Aortic valve regurgitation is not  visualized. No aortic stenosis is present.   5. The inferior vena cava is normal in size with greater than 50%  respiratory variability, suggesting right atrial pressure of 3 mmHg.  EKG:   01/29/21: EKG was not ordered today  Recent Labs: No results found for requested labs within last 365 days.   Recent Lipid Panel    Component Value Date/Time   CHOL 169 02/10/2019 1429   TRIG 80 02/10/2019 1429   HDL 81 02/10/2019 1429   CHOLHDL 2.1 02/10/2019 1429   LDLCALC 73 02/10/2019 1429    CHA2DS2-VASc Score =   [ ] .  Therefore, the patient's annual risk of stroke is   %.        Physical Exam:    VS:  There were no vitals taken for this visit.    Wt Readings  from Last 3 Encounters:  12/30/21 172 lb (78 kg)  10/07/21 175 lb (79.4 kg)  01/29/21 168 lb (76.2 kg)     GEN:  Well nourished, well developed in no acute distress HEENT: Normal NECK: No JVD; No carotid bruits CARDIAC: RRR, no murmurs RESPIRATORY:  Clear to auscultation without rales, wheezing or rhonchi  ABDOMEN: Soft, non-tender, non-distended MUSCULOSKELETAL:  No edema; No deformity  SKIN: Warm and dry NEUROLOGIC:  Alert and oriented x 3 PSYCHIATRIC:  Normal affect  ASSESSMENT:    No diagnosis found.   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 Cornelius Moras. Doing very well with no exercise limitations. His most recent echocardiogram on 04/30/2020 shows normal mean gradient of 2 mmHg, there is no mitral regurgitation.  The EF is 50 to 55%. -Repeat TTE for monitoring  #Post-op Afib: Remains in NSR off amiodarone. Not on AC. No palpitations  #Sinus Arrest s/p PPM placement: -Follow-up with EP as scheduled  #HLD: Calcium score was 94 which was 27th percentile but cardiac cath showed minimal CAD. 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 On repatha and zetia. LDL controlled. -Continue repatha per PCP -Continue zetia  daily -Lipids per PCP  Medication Adjustments/Labs and Tests Ordered: Current medicines are reviewed at length with the patient today.  Concerns regarding medicines are outlined above.  No orders of the defined types were placed in this encounter.  No orders of the defined types were placed in this encounter.  Signed, Meriam Sprague, MD  06/24/2022 3:29 PM    [facility]

## 2022-06-26 ENCOUNTER — Ambulatory Visit: Payer: Medicare Other | Admitting: Cardiology

## 2022-07-21 ENCOUNTER — Telehealth: Payer: Self-pay | Admitting: Cardiology

## 2022-07-21 NOTE — Telephone Encounter (Signed)
Pt walked into the clinic wanting to make an appt with Dr. Mayford Knife, who follows his OSA.  He states his snoring has gotten much worse.   Pt last saw Dr. Mayford Knife 2 years ago for sleep.   Pt is aware that I will route this message to Dr. Norris Cross RN and sleep study coordinator to call him back to arrange overdue follow-up appt with them.  He is aware that he will hear from them shortly to get this appt.   Pt verbalized understanding and agrees with this plan.

## 2022-07-21 NOTE — Telephone Encounter (Signed)
Pt came in to make a sleep study. Please contact to advise on his next steps to achieve his goal of making an appt.

## 2022-07-21 NOTE — Telephone Encounter (Signed)
Called patient to discuss appointment as a new patient has he has not been seen since 2021. Patient states he is having problems with his sleep apnea again, requests appt w/ Dr. Mayford Knife. Scheduled patient for 09/04/22.

## 2022-07-25 NOTE — Progress Notes (Signed)
Remote pacemaker transmission.   

## 2022-07-30 ENCOUNTER — Telehealth: Payer: Self-pay | Admitting: *Deleted

## 2022-07-30 ENCOUNTER — Ambulatory Visit (HOSPITAL_COMMUNITY): Payer: Medicare Other | Attending: Cardiology

## 2022-07-30 ENCOUNTER — Encounter (HOSPITAL_COMMUNITY): Payer: Self-pay

## 2022-07-30 DIAGNOSIS — Z9889 Other specified postprocedural states: Secondary | ICD-10-CM | POA: Diagnosis not present

## 2022-07-30 DIAGNOSIS — I77819 Aortic ectasia, unspecified site: Secondary | ICD-10-CM

## 2022-07-30 DIAGNOSIS — I341 Nonrheumatic mitral (valve) prolapse: Secondary | ICD-10-CM

## 2022-07-30 LAB — ECHOCARDIOGRAM COMPLETE
Area-P 1/2: 4.31 cm2
S' Lateral: 3.3 cm

## 2022-07-30 NOTE — Telephone Encounter (Signed)
-----   Message from Meriam Sprague, MD sent at 07/30/2022  2:22 PM EDT ----- His echo shows normal pumping function. The mitral valve repair looks excellent with trivial leak. The aorta is mildly dilated at 43mm. Will continue with annual echoes for monitoring.

## 2022-07-30 NOTE — Telephone Encounter (Signed)
The patient has been notified of the result and verbalized understanding.  All questions (if any) were answered.  Pt aware that we will order for him to get another echo done in one year for surveillance of his valves and aorta.  He is aware that he will get a call back soon from our echo scheduler, to arrange that appt.  Pt verbalized understanding and agrees with this plan.

## 2022-08-08 DIAGNOSIS — H903 Sensorineural hearing loss, bilateral: Secondary | ICD-10-CM | POA: Diagnosis not present

## 2022-08-08 DIAGNOSIS — H6123 Impacted cerumen, bilateral: Secondary | ICD-10-CM | POA: Diagnosis not present

## 2022-08-11 NOTE — Progress Notes (Unsigned)
Cardiology Office Note:    Date:  08/11/2022   ID:  Alexander [name], DOB 1940/10/04, MRN [id number]  PCP:  Rodrigo Ran, MD  Cardiologist:  Tobias Alexander, MD    Referring MD: Rodrigo Ran, MD   No chief complaint on file.   History of Present Illness:    Alexander [name] is a 82 y.o. male with a history of mitral valve prolapse, remote history of syncope status post permanent pacemaker for sinus node arrest, hypertension, HLD, RBBB coming in today for routine follow-up visit. He is a retired Investment banker, operational.  Per review of the record, the patient underwent mitral valve repair via right minithoracotomy 06/14/2019 by Dr.Owen. He developed post-op atrial fibrillation and was started on amiodarone then converted to normal sinus rhythm. Pre-op cath revealed no evidence of CAD. Normal right and left heart pressures. He had been doing great early post surgery, walked several miles a day and played golf almost on a daily basis. His Coumadin was discontinued after 3 months, was on aspirin only.  His recent echocardiogram on 04/30/2020 shows normal mean gradient of 2 mmHg, there is no mitral regurgitation.  The EF is 50 to 55%.  Was last seen in clinic on 09/2021 where he was doing well from a CV standpoint. TTE 07/2022 with LVEF 60-65%, mild RV dysfunction, s/p MVR with mean gradient 2.42mmHg, trivial MR, ascending aorta 43mm.  Today, ***  Past Medical History:  Diagnosis Date   Heart murmur    Hyperlipidemia    Hypertension    Mitral regurgitation    MVP (mitral valve prolapse)    Presence of permanent cardiac pacemaker    Snoring 09/07/2019   Syncope     Past Surgical History:  Procedure Laterality Date   BUBBLE STUDY  05/23/2019   Procedure: BUBBLE STUDY;  Surgeon: Lars Masson, MD;  Location: [facility] ENDOSCOPY;  Service: Cardiovascular;;   HERNIA REPAIR     MITRAL VALVE REPAIR Right 06/14/2019   Procedure: MINIMALLY INVASIVE MITRAL VALVE REPAIR (MVR)<TEE;  Surgeon:  Purcell Nails, MD;  Location: [facility] OR;  Service: Open Heart Surgery;  Laterality: Right;   PACEMAKER IMPLANT N/A 10/12/2017   Procedure: PACEMAKER IMPLANT;  Surgeon: Regan Lemming, MD;  Location: [facility] INVASIVE CV LAB;  Service: Cardiovascular;  Laterality: N/A;   RIGHT/LEFT HEART CATH AND CORONARY ANGIOGRAPHY N/A 06/06/2019   Procedure: RIGHT/LEFT HEART CATH AND CORONARY ANGIOGRAPHY;  Surgeon: Kathleene Hazel, MD;  Location: [facility] INVASIVE CV LAB;  Service: Cardiovascular;  Laterality: N/A;   TEE WITHOUT CARDIOVERSION N/A 05/23/2019   Procedure: TRANSESOPHAGEAL ECHOCARDIOGRAM (TEE);  Surgeon: Lars Masson, MD;  Location: [facility] ENDOSCOPY;  Service: Cardiovascular;  Laterality: N/A;   TEE WITHOUT CARDIOVERSION N/A 06/14/2019   Procedure: TRANSESOPHAGEAL ECHOCARDIOGRAM (TEE);  Surgeon: Purcell Nails, MD;  Location: [facility] OR;  Service: Open Heart Surgery;  Laterality: N/A;   TONSILECTOMY, ADENOIDECTOMY, BILATERAL MYRINGOTOMY AND TUBES     as child   TONSILLECTOMY     TOTAL KNEE ARTHROPLASTY Left    TOTAL KNEE ARTHROPLASTY Left 02/23/2019   Procedure: TOTAL KNEE ARTHROPLASTY;  Surgeon: Ollen Gross, MD;  Location: [facility] ORS;  Service: Orthopedics;  Laterality: Left;     Current Medications: No outpatient medications have been marked as taking for the 08/21/22 encounter (Appointment) with Meriam Sprague, MD.     Allergies:   Patient has no known allergies.   Social History   Socioeconomic History   Marital status: Married    Spouse name:  Not on file   Number of children: Not on file   Years of education: Not on file   Highest education level: Not on file  Occupational History   Not on file  Tobacco Use   Smoking status: Never   Smokeless tobacco: Never  Vaping Use   Vaping Use: Never used  Substance and Sexual Activity   Alcohol use: Yes    Alcohol/week: 5.0 standard drinks of alcohol    Types: 5 Glasses of wine per week   Drug use: No   Sexual activity: Not on  file  Other Topics Concern   Not on file  Social History Narrative   Not on file   Social Determinants of Health   Financial Resource Strain: Not on file  Food Insecurity: Not on file  Transportation Needs: Not on file  Physical Activity: Not on file  Stress: Not on file  Social Connections: Not on file     Family History: The patient's family history includes Alzheimer's disease in his mother; Hypertension in his father.  ROS:   Please see the history of present illness.    Review of Systems  Constitutional: Negative for chills and fever.  HENT:  Negative for congestion and sore throat.   Eyes:  Negative for double vision and pain.  Cardiovascular:  Negative for chest pain, claudication, cyanosis, dyspnea on exertion, irregular heartbeat, leg swelling, near-syncope, orthopnea, palpitations, paroxysmal nocturnal dyspnea and syncope.  Respiratory:  Negative for snoring and wheezing.   Skin:  Negative for dry skin and rash.  Musculoskeletal:  Negative for falls and myalgias.  Gastrointestinal:  Negative for abdominal pain and diarrhea.  Genitourinary:  Negative for dysuria and frequency.  Neurological:  Negative for dizziness and seizures.  Psychiatric/Behavioral:  The patient does not have insomnia.     All other systems reviewed and negative.   EKGs/Labs/Other Studies Reviewed:    The following studies were reviewed today: Echo 04/30/20  1. The mitral valve has been repaired/replaced. No evidence of mitral  valve regurgitation. No evidence of mitral stenosis. The mean mitral valve  gradient is 2.0 mmHg with average heart rate of 68 bpm. There is a 40 mm prosthetic annuloplasty ring present in the mitral position. Procedure Date: 06/14/2019.   2. Left ventricular ejection fraction, by estimation, is 50 to 55%. The  left ventricle has low normal function. The left ventricle has no regional  wall motion abnormalities. Left ventricular diastolic function could not  be  evaluated.   3. Right ventricular systolic function is normal. The right ventricular  size is normal. There is normal pulmonary artery systolic pressure. The  estimated right ventricular systolic pressure is 22.2 mmHg.   4. The aortic valve is tricuspid. Aortic valve regurgitation is not visualized. No aortic stenosis is present.   5. The inferior vena cava is normal in size with greater than 50%  respiratory variability, suggesting right atrial pressure of 3 mmHg.  Comparison(s): No significant change from prior study.   CTA Chest 06/13/19 IMPRESSION: VASCULAR 1. Fairly mild atherosclerotic plaque without evidence of significant stenosis, occlusion, dissection, aneurysm or other acute arterial abnormality. Aortic Atherosclerosis (ICD10-170.0) 2. Mild left ventricular dilatation history of mitral regurgitation. 3. Bilateral accessory renal arteries. 4. 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 5. Mild ectasia and tortuosity of the bilateral iliac arteries.  NON VASCULAR 1. 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 The duodenum does not cross the midline and the entirety of the colon lies in the left  hemiabdomen including the cecum and appendix. 2. Small 4 mm pulmonary nodule in the right lower lobe is new compared to relatively recent prior imaging from January. This almost certainly represents a small focus of active inflammation/infection. No further follow-up required unless the patient is at high risk for pulmonary malignancy. 3. Stable 5 mm left lower lobe benign pulmonary nodule. 4. Small fat containing umbilical hernia.        CARDIOTHORACIC SURGERY OPERATIVE NOTE   Date of Procedure:                06/14/2019   Preoperative Diagnosis:      Severe Mitral Regurgitation Minimally-Invasive Mitral Valve Repair             Complex valvuloplasty including artificial Gore-tex neochord placement x12             Sorin Memo 4D Ring Annuloplasty (size  40mm, catalog # [id number], serial # [id number])           Surgeon:        Salvatore Decent. Cornelius Moras, MD Operative Findings: Barlow's type myxomatous degenerative disease Bileaflet prolapse with multiple elongated chordae tendinae Type II dysfunction with severe mitral regurgitation Normal left ventricular systolic function No residual mitral regurgitation after successful valve repair   Cardiac catheterization 06/06/2019 1. No angiographic evidence of CAD 2. Normal right and left sided pressures   Continue with planning for mitral valve surgery.    TTE: 04/30/2020   1. The mitral valve has been repaired/replaced. No evidence of mitral  valve regurgitation. No evidence of mitral stenosis. The mean mitral valve  gradient is 2.0 mmHg with average heart rate of 68 bpm. There is a 40 mm  prosthetic annuloplasty ring  present in the mitral position. Procedure Date: 06/14/2019.   2. Left ventricular ejection fraction, by estimation, is 50 to 55%. The  left ventricle has low normal function. The left ventricle has no regional  wall motion abnormalities. Left ventricular diastolic function could not  be evaluated.   3. Right ventricular systolic function is normal. The right ventricular  size is normal. There is normal pulmonary artery systolic pressure. The  estimated right ventricular systolic pressure is 22.2 mmHg.   4. The aortic valve is tricuspid. Aortic valve regurgitation is not  visualized. No aortic stenosis is present.   5. The inferior vena cava is normal in size with greater than 50%  respiratory variability, suggesting right atrial pressure of 3 mmHg.  EKG:   01/29/21: EKG was not ordered today  Recent Labs: No results found for requested labs within last 365 days.   Recent Lipid Panel    Component Value Date/Time   CHOL 169 02/10/2019 1429   TRIG 80 02/10/2019 1429   HDL 81 02/10/2019 1429   CHOLHDL 2.1 02/10/2019 1429   LDLCALC 73 02/10/2019 1429    CHA2DS2-VASc Score =   [ ] .   Therefore, the patient's annual risk of stroke is   %.        Physical Exam:    VS:  There were no vitals taken for this visit.    Wt Readings from Last 3 Encounters:  12/30/21 172 lb (78 kg)  10/07/21 175 lb (79.4 kg)  01/29/21 168 lb (76.2 kg)     GEN:  Well nourished, well developed in no acute distress HEENT: Normal NECK: No JVD; No carotid bruits CARDIAC: RRR, no murmurs RESPIRATORY:  Clear to auscultation without rales, wheezing or rhonchi  ABDOMEN: Soft, non-tender, non-distended MUSCULOSKELETAL:  No edema; No deformity  SKIN: Warm and dry NEUROLOGIC:  Alert and oriented x 3 PSYCHIATRIC:  Normal affect   ASSESSMENT:    No diagnosis found.   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 Cornelius Moras. Doing very well with no exercise limitations. His most recent echocardiogram on 07/2022 shows normal mean gradient of 2 mmHg, trivial MR.  -Continue serial echo monitoring -IE ppx  #Post-op Afib: Remains in NSR off amiodarone. Not on AC. No palpitations  #Sinus Arrest s/p PPM placement: -Follow-up with EP as scheduled  #HLD: Calcium score was 94 which was 27th percentile but cardiac cath showed minimal CAD. 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 On repatha and zetia. LDL controlled. -Continue repatha per PCP -Continue zetia 10mg  daily -Lipids per PCP  Medication Adjustments/Labs and Tests Ordered: Current medicines are reviewed at length with the patient today.  Concerns regarding medicines are outlined above.  No orders of the defined types were placed in this encounter.  No orders of the defined types were placed in this encounter.  Signed, Meriam Sprague, MD  08/11/2022 6:26 PM     [facility]

## 2022-08-12 DIAGNOSIS — E785 Hyperlipidemia, unspecified: Secondary | ICD-10-CM | POA: Diagnosis not present

## 2022-08-12 DIAGNOSIS — I700 Atherosclerosis of aorta: Secondary | ICD-10-CM | POA: Diagnosis not present

## 2022-08-12 DIAGNOSIS — Z125 Encounter for screening for malignant neoplasm of prostate: Secondary | ICD-10-CM | POA: Diagnosis not present

## 2022-08-12 DIAGNOSIS — Z79899 Other long term (current) drug therapy: Secondary | ICD-10-CM | POA: Diagnosis not present

## 2022-08-12 DIAGNOSIS — M8580 Other specified disorders of bone density and structure, unspecified site: Secondary | ICD-10-CM | POA: Diagnosis not present

## 2022-08-12 LAB — COMPLETE METABOLIC PANEL WITH GFR: EGFR: 71.4

## 2022-08-19 DIAGNOSIS — E785 Hyperlipidemia, unspecified: Secondary | ICD-10-CM | POA: Diagnosis not present

## 2022-08-19 DIAGNOSIS — R972 Elevated prostate specific antigen [PSA]: Secondary | ICD-10-CM | POA: Diagnosis not present

## 2022-08-19 DIAGNOSIS — Z0000 Encounter for general adult medical examination without abnormal findings: Secondary | ICD-10-CM | POA: Diagnosis not present

## 2022-08-19 DIAGNOSIS — R413 Other amnesia: Secondary | ICD-10-CM | POA: Diagnosis not present

## 2022-08-19 DIAGNOSIS — I10 Essential (primary) hypertension: Secondary | ICD-10-CM | POA: Diagnosis not present

## 2022-08-19 DIAGNOSIS — R918 Other nonspecific abnormal finding of lung field: Secondary | ICD-10-CM | POA: Diagnosis not present

## 2022-08-19 DIAGNOSIS — I2510 Atherosclerotic heart disease of native coronary artery without angina pectoris: Secondary | ICD-10-CM | POA: Diagnosis not present

## 2022-08-19 DIAGNOSIS — I77810 Thoracic aortic ectasia: Secondary | ICD-10-CM | POA: Diagnosis not present

## 2022-08-19 DIAGNOSIS — I700 Atherosclerosis of aorta: Secondary | ICD-10-CM | POA: Diagnosis not present

## 2022-08-19 DIAGNOSIS — Z79899 Other long term (current) drug therapy: Secondary | ICD-10-CM | POA: Diagnosis not present

## 2022-08-19 DIAGNOSIS — Z950 Presence of cardiac pacemaker: Secondary | ICD-10-CM | POA: Diagnosis not present

## 2022-08-19 DIAGNOSIS — M1990 Unspecified osteoarthritis, unspecified site: Secondary | ICD-10-CM | POA: Diagnosis not present

## 2022-08-21 ENCOUNTER — Encounter: Payer: Self-pay | Admitting: Cardiology

## 2022-08-21 ENCOUNTER — Ambulatory Visit: Payer: Medicare Other | Attending: Cardiology | Admitting: Cardiology

## 2022-08-21 VITALS — BP 122/74 | HR 72 | Ht 70.0 in | Wt 171.0 lb

## 2022-08-21 DIAGNOSIS — I480 Paroxysmal atrial fibrillation: Secondary | ICD-10-CM

## 2022-08-21 DIAGNOSIS — I340 Nonrheumatic mitral (valve) insufficiency: Secondary | ICD-10-CM

## 2022-08-21 DIAGNOSIS — I10 Essential (primary) hypertension: Secondary | ICD-10-CM | POA: Diagnosis not present

## 2022-08-21 DIAGNOSIS — I341 Nonrheumatic mitral (valve) prolapse: Secondary | ICD-10-CM | POA: Diagnosis not present

## 2022-08-21 DIAGNOSIS — I495 Sick sinus syndrome: Secondary | ICD-10-CM | POA: Diagnosis not present

## 2022-08-21 DIAGNOSIS — I77819 Aortic ectasia, unspecified site: Secondary | ICD-10-CM | POA: Diagnosis not present

## 2022-08-21 DIAGNOSIS — Z9889 Other specified postprocedural states: Secondary | ICD-10-CM | POA: Diagnosis not present

## 2022-08-21 NOTE — Patient Instructions (Signed)
Medication Instructions:  Your physician recommends that you continue on your current medications as directed. Please refer to the Current Medication list given to you today.  *If you need a refill on your cardiac medications before your next appointment, please call your pharmacy*   Follow-Up: As scheduled.

## 2022-08-25 DIAGNOSIS — R82998 Other abnormal findings in urine: Secondary | ICD-10-CM | POA: Diagnosis not present

## 2022-08-25 DIAGNOSIS — I10 Essential (primary) hypertension: Secondary | ICD-10-CM | POA: Diagnosis not present

## 2022-08-26 ENCOUNTER — Ambulatory Visit: Payer: Medicare Other | Admitting: Cardiology

## 2022-08-29 DIAGNOSIS — Z1211 Encounter for screening for malignant neoplasm of colon: Secondary | ICD-10-CM | POA: Diagnosis not present

## 2022-09-04 ENCOUNTER — Ambulatory Visit: Payer: Medicare Other | Admitting: Cardiology

## 2022-09-04 ENCOUNTER — Telehealth: Payer: Self-pay | Admitting: Pharmacist

## 2022-09-04 ENCOUNTER — Encounter: Payer: Self-pay | Admitting: Cardiology

## 2022-09-04 ENCOUNTER — Ambulatory Visit: Payer: Medicare Other | Attending: Cardiology | Admitting: Cardiology

## 2022-09-04 VITALS — BP 112/70 | HR 64 | Ht 70.0 in | Wt 172.4 lb

## 2022-09-04 DIAGNOSIS — I480 Paroxysmal atrial fibrillation: Secondary | ICD-10-CM | POA: Insufficient documentation

## 2022-09-04 DIAGNOSIS — G4733 Obstructive sleep apnea (adult) (pediatric): Secondary | ICD-10-CM | POA: Diagnosis not present

## 2022-09-04 NOTE — Progress Notes (Signed)
Cardiology Office Note:    Date:  09/04/2022   ID:  Alexander [name] 01/13/1941, MRN [id number]  PCP:  Rodrigo Ran, MD  Cardiologist:  Tobias Alexander, MD    Referring MD: Rodrigo Ran, MD   Chief Complaint  Patient presents with   Sleep Apnea    History of Present Illness:    Alexander [name] is a 82 y.o. male with a hx of HLD, HTN, MVP with MR and PPM who was referred for sleep study due to snoring by Dr. Delton See.  Due to being he underwent PSG showing very mild OSA with an AHI of 6.78/hr and O2 sats as low as 84%.  His OSA was moderate during REM sleep at 15.4/hr and 10.6/hr during supine sleep.  Since his only symptom was snoring he was referred to ENT.  He was seen by his normal ENT but only had his hearing addressed and not his issues with snoring.  I have not seen him since 2021 and he is now here for follow-up.  His wife is with him today and says that his snoring is very sever and he wakes up gasping for breath and keeps his wife up all night and now she does not sleep in the same room with him and is very frustrated.  His wife says that he definitely stops breathing in his sleep and is also waking up feeling tired and sleeping longer that he used to and is having memory issues.  He can be in bed 9+ hours and his wife will have to go in and get him up.  Past Medical History:  Diagnosis Date   Heart murmur    Hyperlipidemia    Hypertension    Mitral regurgitation    MVP (mitral valve prolapse)    OSA (obstructive sleep apnea)    AHI of 6.78/hr and O2 sats as low as 84%.  His OSA was moderate during REM sleep at 15.4/hr and 10.6/hr during supine sleep   Presence of permanent cardiac pacemaker    Snoring 09/07/2019   Syncope     Past Surgical History:  Procedure Laterality Date   BUBBLE STUDY  05/23/2019   Procedure: BUBBLE STUDY;  Surgeon: Lars Masson, MD;  Location: [facility] ENDOSCOPY;  Service: Cardiovascular;;   HERNIA REPAIR     MITRAL VALVE REPAIR  Right 06/14/2019   Procedure: MINIMALLY INVASIVE MITRAL VALVE REPAIR (MVR)<TEE;  Surgeon: Purcell Nails, MD;  Location: [facility] OR;  Service: Open Heart Surgery;  Laterality: Right;   PACEMAKER IMPLANT N/A 10/12/2017   Procedure: PACEMAKER IMPLANT;  Surgeon: Regan Lemming, MD;  Location: [facility] INVASIVE CV LAB;  Service: Cardiovascular;  Laterality: N/A;   RIGHT/LEFT HEART CATH AND CORONARY ANGIOGRAPHY N/A 06/06/2019   Procedure: RIGHT/LEFT HEART CATH AND CORONARY ANGIOGRAPHY;  Surgeon: Kathleene Hazel, MD;  Location: [facility] INVASIVE CV LAB;  Service: Cardiovascular;  Laterality: N/A;   TEE WITHOUT CARDIOVERSION N/A 05/23/2019   Procedure: TRANSESOPHAGEAL ECHOCARDIOGRAM (TEE);  Surgeon: Lars Masson, MD;  Location: [facility] ENDOSCOPY;  Service: Cardiovascular;  Laterality: N/A;   TEE WITHOUT CARDIOVERSION N/A 06/14/2019   Procedure: TRANSESOPHAGEAL ECHOCARDIOGRAM (TEE);  Surgeon: Purcell Nails, MD;  Location: [facility] OR;  Service: Open Heart Surgery;  Laterality: N/A;   TONSILECTOMY, ADENOIDECTOMY, BILATERAL MYRINGOTOMY AND TUBES     as child   TONSILLECTOMY     TOTAL KNEE ARTHROPLASTY Left    TOTAL KNEE ARTHROPLASTY Left 02/23/2019   Procedure: TOTAL KNEE ARTHROPLASTY;  Surgeon: Ollen Gross,  MD;  Location: [facility] ORS;  Service: Orthopedics;  Laterality: Left;     Current Medications: No outpatient medications have been marked as taking for the 09/04/22 encounter (Office Visit) with Quintella Reichert, MD.     Allergies:   Patient has no known allergies.   Social History   Socioeconomic History   Marital status: Married    Spouse name: Not on file   Number of children: Not on file   Years of education: Not on file   Highest education level: Not on file  Occupational History   Not on file  Tobacco Use   Smoking status: Never   Smokeless tobacco: Never  Vaping Use   Vaping Use: Never used  Substance and Sexual Activity   Alcohol use: Yes    Alcohol/week: 5.0 standard drinks of  alcohol    Types: 5 Glasses of wine per week   Drug use: No   Sexual activity: Not on file  Other Topics Concern   Not on file  Social History Narrative   Not on file   Social Determinants of Health   Financial Resource Strain: Not on file  Food Insecurity: Not on file  Transportation Needs: Not on file  Physical Activity: Not on file  Stress: Not on file  Social Connections: Not on file     Family History: The patient's family history includes Alzheimer's disease in his mother; Hypertension in his father.  ROS:   Please see the history of present illness.    ROS  All other systems reviewed and negative.   EKGs/Labs/Other Studies Reviewed:    The following studies were reviewed today: Sleep study  EKG Interpretation  Date/Time:  Thursday September 04 2022 10:22:02 EDT Ventricular Rate:  64 PR Interval:  198 QRS Duration: 132 QT Interval:  398 QTC Calculation: 410 R Axis:   90 Text Interpretation: Atrial-paced rhythm Right bundle branch block When compared with ECG of 15-Jun-2019 06:50, Right bundle branch block has replaced Non-specific intra-ventricular conduction block Confirmed by Armanda Magic ([id number]) on 09/04/2022 10:35:36 AM   Recent Labs: No results found for requested labs within last 365 days.   Recent Lipid Panel    Component Value Date/Time   CHOL 169 02/10/2019 1429   TRIG 80 02/10/2019 1429   HDL 81 02/10/2019 1429   CHOLHDL 2.1 02/10/2019 1429   LDLCALC 73 02/10/2019 1429    Physical Exam:    VS:  Ht 5\' 10"  (1.778 m)   BMI 24.54 kg/m     Wt Readings from Last 3 Encounters:  08/21/22 171 lb (77.6 kg)  12/30/21 172 lb (78 kg)  10/07/21 175 lb (79.4 kg)     GEN: Well nourished, well developed in no acute distress HEENT: Normal NECK: No JVD; No carotid bruits LYMPHATICS: No lymphadenopathy CARDIAC:RRR, no murmurs, rubs, gallops RESPIRATORY:  Clear to auscultation without rales, wheezing or rhonchi  ABDOMEN: Soft, non-tender,  non-distended MUSCULOSKELETAL:  No edema; No deformity  SKIN: Warm and dry NEUROLOGIC:  Alert and oriented x 3 PSYCHIATRIC:  Normal affect  ASSESSMENT:    1. PAF (paroxysmal atrial fibrillation) (HCC)   2. OSA (obstructive sleep apnea)    PLAN:    In order of problems listed above:  1.  OSA -Sleep study showed mild OSA with AHI 6.8/hr but moderate during REM sleep with AHI at 15/hr and during supine sleep 10/hr -since his only symptom is snoring I would like to refer him to ENT to make sure there are  no surgical causes for his sleep apnea >> he was seen by his usual ENT but did not have his snoring addressed and only had his hearing addressed -his wife is with him today and states that he snores bad at night and wakes up gasping for breath at times, wakes up feeling tired and has memory issues and would like to try the CPAP -I will start an ResMed auto CPAP from 4 to 12cm H2O with heated humidity and nasal pillow mask with chin strap -I will see him back in 6 weeks after he gets his device  2.  HTN -BP controlled -continue low Na diet   Medication Adjustments/Labs and Tests Ordered: Current medicines are reviewed at length with the patient today.  Concerns regarding medicines are outlined above.  Orders Placed This Encounter  Procedures   EKG 12-Lead    No orders of the defined types were placed in this encounter.   Signed, Armanda Magic, MD  09/04/2022 10:26 AM    [facility]

## 2022-09-04 NOTE — Telephone Encounter (Signed)
Received a message about patient's Repatha from Pt wife stating he isnt on it and Dr. Mayford Knife said he should be. After further review, it appears pt is on Leqvio rx by Dr. Waynard Edwards.  I reviewed this with wife and the 4 injection dates that he has already received. Advised to continue with Leqvio. Still using [facility] for injections. I did advise to let them know if insurance has changed. Med list updated with correct medications. Pt wife appreciative of the call.

## 2022-09-04 NOTE — Patient Instructions (Signed)
Medication Instructions:  Your physician recommends that you continue on your current medications as directed. Please refer to the Current Medication list given to you today.  *If you need a refill on your cardiac medications before your next appointment, please call your pharmacy*   Lab Work: None.  If you have labs (blood work) drawn today and your tests are completely normal, you will receive your results only by: MyChart Message (if you have MyChart) OR A paper copy in the mail If you have any lab test that is abnormal or we need to change your treatment, we will call you to review the results.   Testing/Procedures: None.   Follow-Up:  We recommend signing up for the patient portal called "MyChart".  Sign up information is provided on this After Visit Summary.  MyChart is used to connect with patients for Virtual Visits (Telemedicine).  Patients are able to view lab/test results, encounter notes, upcoming appointments, etc.  Non-urgent messages can be sent to your provider as well.   To learn more about what you can do with MyChart, go to [email or web address].    Your next appointment will be approximately 6 weeks after delivery of your cpap machine and it will be with:     Provider:    Dr. Armanda Magic, MD  A script has been sent to your DME company for a new cpap machine. Once it is delivered someone will call you to schedule a follow up visit.

## 2022-09-09 ENCOUNTER — Telehealth: Payer: Self-pay | Admitting: *Deleted

## 2022-09-09 DIAGNOSIS — G4733 Obstructive sleep apnea (adult) (pediatric): Secondary | ICD-10-CM

## 2022-09-09 DIAGNOSIS — I10 Essential (primary) hypertension: Secondary | ICD-10-CM

## 2022-09-09 NOTE — Telephone Encounter (Signed)
Upon patient request DME selection is [facility], [facility]. Patient understands he will be contacted by [facility] to set up his cpap. Patient understands to call if [facility] does not contact him with new setup in a timely manner. Patient understands they will be called once confirmation has been received from [facility] that they have received their new machine to schedule 10 week follow up appointment.   [facility] notified of new cpap order  Please add to airview Patient was grateful for the call and thanked me.

## 2022-09-09 NOTE — Telephone Encounter (Signed)
-----   Message from Quintella Reichert, MD sent at 09/04/2022 10:47 AM EDT ----- ResMed auto CPAP from 4 to 12cm H2O with heated humidity and nasal pillow mask with chin strap And followup with me 6 weeks after he gets his device

## 2022-09-16 ENCOUNTER — Ambulatory Visit (INDEPENDENT_AMBULATORY_CARE_PROVIDER_SITE_OTHER): Payer: Medicare Other

## 2022-09-16 DIAGNOSIS — I495 Sick sinus syndrome: Secondary | ICD-10-CM | POA: Diagnosis not present

## 2022-09-17 ENCOUNTER — Telehealth: Payer: Self-pay | Admitting: *Deleted

## 2022-09-17 DIAGNOSIS — G4733 Obstructive sleep apnea (adult) (pediatric): Secondary | ICD-10-CM

## 2022-09-17 DIAGNOSIS — I10 Essential (primary) hypertension: Secondary | ICD-10-CM

## 2022-09-17 DIAGNOSIS — R0683 Snoring: Secondary | ICD-10-CM

## 2022-09-17 LAB — CUP PACEART REMOTE DEVICE CHECK
Battery Remaining Longevity: 115 mo
Battery Voltage: 3 V
Brady Statistic AP VP Percent: 0.01 %
Brady Statistic AP VS Percent: 36.64 %
Brady Statistic AS VP Percent: 0.03 %
Brady Statistic AS VS Percent: 63.32 %
Brady Statistic RA Percent Paced: 37.25 %
Brady Statistic RV Percent Paced: 0.04 %
Date Time Interrogation Session: 20240707211426
Implantable Lead Connection Status: 753985
Implantable Lead Connection Status: 753985
Implantable Lead Implant Date: 20190805
Implantable Lead Implant Date: 20190805
Implantable Lead Location: 753859
Implantable Lead Location: 753860
Implantable Lead Model: 5076
Implantable Lead Model: 5076
Implantable Pulse Generator Implant Date: 20190805
Lead Channel Impedance Value: 285 Ohm
Lead Channel Impedance Value: 323 Ohm
Lead Channel Impedance Value: 399 Ohm
Lead Channel Impedance Value: 418 Ohm
Lead Channel Pacing Threshold Amplitude: 0.75 V
Lead Channel Pacing Threshold Amplitude: 0.75 V
Lead Channel Pacing Threshold Pulse Width: 0.4 ms
Lead Channel Pacing Threshold Pulse Width: 0.4 ms
Lead Channel Sensing Intrinsic Amplitude: 3.75 mV
Lead Channel Sensing Intrinsic Amplitude: 3.75 mV
Lead Channel Sensing Intrinsic Amplitude: 4.375 mV
Lead Channel Sensing Intrinsic Amplitude: 4.375 mV
Lead Channel Setting Pacing Amplitude: 2 V
Lead Channel Setting Pacing Amplitude: 2.5 V
Lead Channel Setting Pacing Pulse Width: 0.4 ms
Lead Channel Setting Sensing Sensitivity: 1.2 mV
Zone Setting Status: 755011
Zone Setting Status: 755011

## 2022-09-17 NOTE — Telephone Encounter (Signed)
Per [facility] a unable to Process request Notification came today because per Medicare guidelines his 2021 sleep study has expired. Once the patient has a new study, office notes prior to the sleep study, then they will be able to further qualify the patient for their cpap device.

## 2022-10-06 ENCOUNTER — Telehealth: Payer: Self-pay | Admitting: *Deleted

## 2022-10-06 ENCOUNTER — Telehealth: Payer: Self-pay | Admitting: Cardiology

## 2022-10-06 MED ORDER — METOPROLOL SUCCINATE ER 50 MG PO TB24: 50.0000 mg | ORAL_TABLET | Freq: Every day | ORAL | 3 refills | Status: DC

## 2022-10-06 NOTE — Telephone Encounter (Signed)
Pt c/o medication issue:  1. Name of Medication:   Did not recall  2. How are you currently taking this medication (dosage and times per day)?   3. Are you having a reaction (difficulty breathing--STAT)?   4. What is your medication issue?   Patient stated he is being put on new medication and wants Dr. Devin Going advice before starting the medication.

## 2022-10-06 NOTE — Telephone Encounter (Signed)
-----   Message from [facility] sent at 09/26/2022 10:51 AM EDT ----- Abnormal device interrogation reviewed.  Lead parameters and battery status stable.  VT episode. Start toprol xl 50 mg

## 2022-10-06 NOTE — Telephone Encounter (Signed)
Alexander [name], Alexander "Rod" - 10/06/2022  8:59 AM Meriam Sprague, MD  Sent: Mon October 06, 2022  3:00 PM  To: Loa Socks, LPN         Message  Agree with starting the metoprolol as he had a 27 beat run of VT on his monitor. If this ends up being too much (HR drop, BP drops), we can decrease to 25mg  daily.    Thank you for checking!     Pt made aware of message above per Dr. Shari Prows.  Advised him that he should take toprol xl 50 mg po daily at bedtime, as advised by Dr. Shari Prows and Dr. Elberta Fortis.  Informed the pt that per Dr. Shari Prows, if his BPs/HRs start dropping, he can always decrease the dose to 25 mg po daily at bedtime.    Pt verbalized understanding and agrees with this plan.

## 2022-10-06 NOTE — Telephone Encounter (Signed)
Dr. Shari Prows, pt is calling to get your opinion about starting new medication Toprol XL 50 mg po daily.   Pt was recommended to start this by Dr. Elberta Fortis today for abnormal device interrogation.  (See interrogation result note from today about this as well as mychart message/call from Dr. Elberta Fortis RN).  He just wants to make sure you agree with this recommendation before starting the medication.  He is very hesitant in starting, due to extensive cardiac history as well as having lower BPs/HRs in the past.  Also he was advised by his wife to double check with you.  Pt education provided about Toprol XL and indications for use.   Informed the pt that I would route this message to you to get your input on this, and I will follow-up with him accordingly thereafter.   Pt agrees to this plan.

## 2022-10-06 NOTE — Telephone Encounter (Signed)
Pt agreeable. Sending Rx to [facility] per pt request.

## 2022-10-07 ENCOUNTER — Other Ambulatory Visit (HOSPITAL_COMMUNITY): Payer: Medicare Other

## 2022-10-09 ENCOUNTER — Telehealth: Payer: Self-pay | Admitting: Cardiology

## 2022-10-09 NOTE — Telephone Encounter (Signed)
Patient is asking that you give him a call. Please advise  

## 2022-10-09 NOTE — Progress Notes (Signed)
Remote pacemaker transmission.   

## 2022-10-09 NOTE — Telephone Encounter (Signed)
Pt just wanted to let us know he started the medication last night. He was updating [place] as requested. Aware to let us know if problems/SE occur after starting. Patient verbalized understanding and agreeable to plan.

## 2022-10-10 ENCOUNTER — Telehealth: Payer: Self-pay

## 2022-10-10 NOTE — Telephone Encounter (Signed)
Returned call from patient's spouse Bonita Quin [facility]) regarding sleep study scheduled for 11/05/22 at 8 pm. Gave location as well as contact info for tech who will give all prep instructions Apolinar Junes, [phone]). Answered questions related to process for insurance approval for cpap as well as DME selection and delivery.

## 2022-10-16 DIAGNOSIS — H6121 Impacted cerumen, right ear: Secondary | ICD-10-CM | POA: Diagnosis not present

## 2022-10-16 DIAGNOSIS — H903 Sensorineural hearing loss, bilateral: Secondary | ICD-10-CM | POA: Diagnosis not present

## 2022-11-05 ENCOUNTER — Ambulatory Visit (HOSPITAL_BASED_OUTPATIENT_CLINIC_OR_DEPARTMENT_OTHER): Payer: Medicare Other | Attending: Cardiology | Admitting: Cardiology

## 2022-11-05 ENCOUNTER — Other Ambulatory Visit (HOSPITAL_COMMUNITY): Payer: Self-pay | Admitting: *Deleted

## 2022-11-05 VITALS — Ht 70.0 in | Wt 168.0 lb

## 2022-11-05 DIAGNOSIS — G4733 Obstructive sleep apnea (adult) (pediatric): Secondary | ICD-10-CM | POA: Insufficient documentation

## 2022-11-05 DIAGNOSIS — R0683 Snoring: Secondary | ICD-10-CM | POA: Diagnosis not present

## 2022-11-05 DIAGNOSIS — I10 Essential (primary) hypertension: Secondary | ICD-10-CM | POA: Diagnosis not present

## 2022-11-06 ENCOUNTER — Ambulatory Visit (HOSPITAL_COMMUNITY)
Admission: RE | Admit: 2022-11-06 | Discharge: 2022-11-06 | Disposition: A | Discharge status: Home / Self Care | Payer: Medicare Other | Source: Ambulatory Visit | Attending: Internal Medicine | Admitting: Internal Medicine

## 2022-11-06 DIAGNOSIS — E785 Hyperlipidemia, unspecified: Secondary | ICD-10-CM | POA: Insufficient documentation

## 2022-11-06 DIAGNOSIS — I2510 Atherosclerotic heart disease of native coronary artery without angina pectoris: Secondary | ICD-10-CM | POA: Insufficient documentation

## 2022-11-06 MED ORDER — INCLISIRAN SODIUM 284 MG/1.5ML ~~LOC~~ SOSY
PREFILLED_SYRINGE | SUBCUTANEOUS | Status: AC
  Administered 2022-11-06: 284 mg via SUBCUTANEOUS
  Filled 2022-11-06: qty 1.5

## 2022-11-06 MED ORDER — INCLISIRAN SODIUM 284 MG/1.5ML ~~LOC~~ SOSY: 284.0000 mg | PREFILLED_SYRINGE | Freq: Once | SUBCUTANEOUS | Status: AC

## 2022-11-10 NOTE — Procedures (Signed)
   Patient Name: Alexander [name], Alexander [name] Date:11/05/2022 Gender: Male D.O.B: Mar 12, 1940 Age (years): 69 Referring Provider: Armanda Magic MD, ABSM Height (inches): 70 Interpreting Physician: Armanda Magic MD, ABSM Weight (lbs): 167 RPSGT: Lowry Ram BMI: 24 MRN: [id number] Neck Size: 15.00  CLINICAL INFORMATION Sleep Study Type: NPSG  Indication for sleep study: Hypertension, Re-Evaluation, Snoring  Epworth Sleepiness Score: 2  Most recent polysomnogram dated 09/07/2019 revealed an AHI of 6.8/h and RDI of 12.0/h.  SLEEP STUDY TECHNIQUE As per the AASM Manual for the Scoring of Sleep and Associated Events v2.3 (April 2016) with a hypopnea requiring 4% desaturations.  The channels recorded and monitored were frontal, central and occipital EEG, electrooculogram (EOG), submentalis EMG (chin), nasal and oral airflow, thoracic and abdominal wall motion, anterior tibialis EMG, snore microphone, electrocardiogram, and pulse oximetry.  MEDICATIONS Medications self-administered by patient taken the night of the study : AMBIEN  SLEEP ARCHITECTURE The study was initiated at 10:37:35 PM and ended at 5:14:36 AM.  Sleep onset time was 1.9 minutes and the sleep efficiency was 90.0%. The total sleep time was 357.5 minutes.  Stage REM latency was 259.5 minutes.  The patient spent 7.3% of the night in stage N1 sleep, 84.9% in stage N2 sleep, 0.0% in stage N3 and 7.8% in REM.  Alpha intrusion was absent.  Supine sleep was 39.08%.  RESPIRATORY PARAMETERS The overall apnea/hypopnea index (AHI) was 2.3 per hour. There were 1 total apneas, including 1 obstructive, 0 central and 0 mixed apneas. There were 13 hypopneas and 7 RERAs.  The AHI during Stage REM sleep was 0.0 per hour.  AHI while supine was 4.7 per hour.  The mean oxygen saturation was 92.5%. The minimum SpO2 during sleep was 87.0%.  moderate snoring was noted during this study.  CARDIAC DATA The 2 lead EKG demonstrated  NSR. The mean heart rate was 60.8 beats per minute. Other EKG findings include: PVCs.  LEG MOVEMENT DATA The total PLMS were 0 with a resulting PLMS index of 0.0. Associated arousal with leg movement index was 6.7 .  IMPRESSIONS - No significant obstructive sleep apnea occurred during this study (AHI = 2.3/h). - Mild oxygen desaturation was noted during this study (Min O2 = 87.0%). - The patient snored with moderate snoring volume. - EKG findings include PVCs. - Clinically significant periodic limb movements did not occur during sleep. Associated arousals were significant.  DIAGNOSIS - Normal Study  RECOMMENDATIONS - Avoid alcohol, sedatives and other CNS depressants that may worsen sleep apnea and disrupt normal sleep architecture. - Sleep hygiene should be reviewed to assess factors that may improve sleep quality. - Weight management and regular exercise should be initiated or continued if appropriate.  [Electronically signed] 11/10/2022 08:47 PM  Armanda Magic MD, ABSM Diplomate, American Board of Sleep Medicine

## 2022-11-11 ENCOUNTER — Telehealth: Payer: Self-pay

## 2022-11-11 NOTE — Addendum Note (Signed)
Addended by: Brunetta Genera on: 11/11/2022 02:51 PM   Modules accepted: Orders

## 2022-11-11 NOTE — Telephone Encounter (Signed)
Notified patient of sleep study results and recommendations. All questions were answered and patient verbalized understanding.

## 2022-11-11 NOTE — Progress Notes (Signed)
Spoke with patient and wife, per DPR. Patient will come into office tomorrow 11/12/22 to pick up [facility] Sleep Test.

## 2022-11-11 NOTE — Telephone Encounter (Signed)
-----   Message from Armanda Magic sent at 11/10/2022  8:48 PM EDT ----- Please let patient know that sleep study showed no significant sleep apnea.

## 2022-11-13 DIAGNOSIS — R972 Elevated prostate specific antigen [PSA]: Secondary | ICD-10-CM | POA: Diagnosis not present

## 2022-11-14 ENCOUNTER — Encounter (HOSPITAL_COMMUNITY): Payer: Medicare Other

## 2022-11-17 ENCOUNTER — Other Ambulatory Visit (HOSPITAL_BASED_OUTPATIENT_CLINIC_OR_DEPARTMENT_OTHER): Payer: Self-pay

## 2022-12-01 DIAGNOSIS — E785 Hyperlipidemia, unspecified: Secondary | ICD-10-CM | POA: Diagnosis not present

## 2022-12-01 DIAGNOSIS — I480 Paroxysmal atrial fibrillation: Secondary | ICD-10-CM | POA: Diagnosis not present

## 2022-12-01 DIAGNOSIS — Z950 Presence of cardiac pacemaker: Secondary | ICD-10-CM | POA: Diagnosis not present

## 2022-12-01 DIAGNOSIS — R413 Other amnesia: Secondary | ICD-10-CM | POA: Diagnosis not present

## 2022-12-01 DIAGNOSIS — I341 Nonrheumatic mitral (valve) prolapse: Secondary | ICD-10-CM | POA: Diagnosis not present

## 2022-12-01 DIAGNOSIS — R0683 Snoring: Secondary | ICD-10-CM | POA: Diagnosis not present

## 2022-12-01 DIAGNOSIS — I10 Essential (primary) hypertension: Secondary | ICD-10-CM | POA: Diagnosis not present

## 2022-12-01 DIAGNOSIS — Z79899 Other long term (current) drug therapy: Secondary | ICD-10-CM | POA: Diagnosis not present

## 2022-12-03 ENCOUNTER — Ambulatory Visit (INDEPENDENT_AMBULATORY_CARE_PROVIDER_SITE_OTHER): Payer: Self-pay | Admitting: Diagnostic Neuroimaging

## 2022-12-03 ENCOUNTER — Encounter: Payer: Self-pay | Admitting: Diagnostic Neuroimaging

## 2022-12-03 VITALS — BP 137/82 | HR 63 | Ht 69.0 in | Wt 170.0 lb

## 2022-12-03 DIAGNOSIS — R413 Other amnesia: Secondary | ICD-10-CM | POA: Diagnosis not present

## 2022-12-03 NOTE — Progress Notes (Signed)
[facility]  PATIENT: Alexander [name] DOB: January 27, 1941  REFERRING CLINICIAN: Rodrigo Ran, MD HISTORY FROM: patient and wife REASON FOR VISIT: new consult   HISTORICAL  CHIEF COMPLAINT:  Chief Complaint  Patient presents with   New Patient (Initial Visit)    Patient in room #7 with his wife. Patient states he here to follow and discuss his memory issues.    HISTORY OF PRESENT ILLNESS:   82 year old male, retired Investment banker, operational, here for evaluation of memory loss.  Past 6 to 12 months patient has had onset of mild short-term memory loss, having difficulty remembering names, appointments and conversations.   Had an incident for his recent primary care office visit where he showed up at the wrong time.  This is unusual for him.  Otherwise he is able to maintain most of his own ADLs.  He is still driving, playing golf on a regular basis, able to be on his own.  However wife is noted that he is starting to become a little bit less reliable when she asks him to do tasks.  They are trying to use reminders, smart phone alarms and other compensation techniques.  At PCP visit on 12/01/2022, concern for possible mild dementia was raised due to family history in his mother.  He was started on galantamine and had phosphorylated tau testing done but not resulted yet.    REVIEW OF SYSTEMS: Full 14 system review of systems performed and negative with exception of: as per HPI.  ALLERGIES: No Known Allergies  HOME MEDICATIONS: Outpatient Medications Prior to Visit  Medication Sig Dispense Refill   aspirin EC 81 MG tablet Take 1 tablet (81 mg total) by mouth daily. Swallow whole. 90 tablet 3   COVID-19 mRNA vaccine 2023-2024 (COMIRNATY) syringe Inject into the muscle. 0.3 mL 0   ezetimibe (ZETIA) 10 MG tablet Take 10 mg by mouth daily.     galantamine (RAZADYNE ER) 8 MG 24 hr capsule Take 8 mg by mouth every morning.     inclisiran (LEQVIO) 284 MG/1.5ML SOSY  injection Inject 1.69ml every 6 months 1.5 mL 0   influenza vac split quadrivalent PF (FLUARIX) 0.5 ML injection Inject into the muscle. 0.5 mL 0   influenza vaccine adjuvanted (FLUAD) 0.5 ML injection Inject into the muscle. 0.5 mL 0   influenza vaccine adjuvanted (FLUAD) 0.5 ML injection Inject into the muscle. 0.5 mL 0   metoprolol succinate (TOPROL-XL) 50 MG 24 hr tablet Take 1 tablet (50 mg total) by mouth at bedtime. Take with or immediately following a meal. 30 tablet 3   pneumococcal 20-valent conjugate vaccine (PREVNAR 20) 0.5 ML injection Inject 0.5 mLs into the muscle. 0.5 mL 0   RSV vaccine recomb adjuvanted (AREXVY) 120 MCG/0.5ML injection Inject into the muscle. 0.5 mL 0   Tdap (BOOSTRIX) 5-2.5-18.5 LF-MCG/0.5 injection Inject into the muscle. 0.5 mL 0   zolpidem (AMBIEN) 10 MG tablet Take 5-10 mg by mouth at bedtime as needed.      No facility-administered medications prior to visit.    PAST MEDICAL HISTORY: Past Medical History:  Diagnosis Date   Heart murmur    Hyperlipidemia    Hypertension    Mitral regurgitation    MVP (mitral valve prolapse)    OSA (obstructive sleep apnea)    AHI of 6.78/hr and O2 sats as low as 84%.  His OSA was moderate during REM sleep at 15.4/hr and 10.6/hr during supine sleep   Presence of permanent cardiac pacemaker  Snoring 09/07/2019   Syncope     PAST SURGICAL HISTORY: Past Surgical History:  Procedure Laterality Date   BUBBLE STUDY  05/23/2019   Procedure: BUBBLE STUDY;  Surgeon: Lars Masson, MD;  Location: [facility] ENDOSCOPY;  Service: Cardiovascular;;   HERNIA REPAIR     MITRAL VALVE REPAIR Right 06/14/2019   Procedure: MINIMALLY INVASIVE MITRAL VALVE REPAIR (MVR)<TEE;  Surgeon: Purcell Nails, MD;  Location: [facility] OR;  Service: Open Heart Surgery;  Laterality: Right;   PACEMAKER IMPLANT N/A 10/12/2017   Procedure: PACEMAKER IMPLANT;  Surgeon: Regan Lemming, MD;  Location: [facility] INVASIVE CV LAB;  Service: Cardiovascular;   Laterality: N/A;   RIGHT/LEFT HEART CATH AND CORONARY ANGIOGRAPHY N/A 06/06/2019   Procedure: RIGHT/LEFT HEART CATH AND CORONARY ANGIOGRAPHY;  Surgeon: Kathleene Hazel, MD;  Location: [facility] INVASIVE CV LAB;  Service: Cardiovascular;  Laterality: N/A;   TEE WITHOUT CARDIOVERSION N/A 05/23/2019   Procedure: TRANSESOPHAGEAL ECHOCARDIOGRAM (TEE);  Surgeon: Lars Masson, MD;  Location: [facility] ENDOSCOPY;  Service: Cardiovascular;  Laterality: N/A;   TEE WITHOUT CARDIOVERSION N/A 06/14/2019   Procedure: TRANSESOPHAGEAL ECHOCARDIOGRAM (TEE);  Surgeon: Purcell Nails, MD;  Location: [facility] OR;  Service: Open Heart Surgery;  Laterality: N/A;   TONSILECTOMY, ADENOIDECTOMY, BILATERAL MYRINGOTOMY AND TUBES     as child   TONSILLECTOMY     TOTAL KNEE ARTHROPLASTY Left    TOTAL KNEE ARTHROPLASTY Left 02/23/2019   Procedure: TOTAL KNEE ARTHROPLASTY;  Surgeon: Ollen Gross, MD;  Location: [facility] ORS;  Service: Orthopedics;  Laterality: Left;     FAMILY HISTORY: Family History  Problem Relation Age of Onset   Alzheimer's disease Mother    Hypertension Father     SOCIAL HISTORY: Social History   Socioeconomic History   Marital status: Married    Spouse name: Not on file   Number of children: Not on file   Years of education: Not on file   Highest education level: Not on file  Occupational History   Not on file  Tobacco Use   Smoking status: Never   Smokeless tobacco: Never  Vaping Use   Vaping status: Never Used  Substance and Sexual Activity   Alcohol use: Yes    Alcohol/week: 5.0 standard drinks of alcohol    Types: 5 Glasses of wine per week   Drug use: No   Sexual activity: Not on file  Other Topics Concern   Not on file  Social History Narrative   Not on file   Social Determinants of Health   Financial Resource Strain: Not on file  Food Insecurity: Low Risk  (10/16/2022)   Received from [facility]   Hunger Vital Sign    Worried About Running Out of Food in the Last  Year: Never true    Ran Out of Food in the Last Year: Never true  Transportation Needs: Not on file (10/16/2022)  Physical Activity: Not on file  Stress: Not on file  Social Connections: Not on file  Intimate Partner Violence: Not on file     PHYSICAL EXAM  GENERAL EXAM/CONSTITUTIONAL: Vitals:  Vitals:   12/03/22 1619  BP: 137/82  Pulse: 63  Weight: 170 lb (77.1 kg)  Height: 5\' 9"  (1.753 m)   Body mass index is 25.1 kg/m. Wt Readings from Last 3 Encounters:  12/03/22 170 lb (77.1 kg)  11/05/22 168 lb (76.2 kg)  09/04/22 172 lb 6.4 oz (78.2 kg)   Patient is in no distress; well developed, nourished and groomed; neck  is supple  CARDIOVASCULAR: Examination of carotid arteries is normal; no carotid bruits Regular rate and rhythm, no murmurs Examination of peripheral vascular system by observation and palpation is normal  EYES: Ophthalmoscopic exam of optic discs and posterior segments is normal; no papilledema or hemorrhages No results found.  MUSCULOSKELETAL: Gait, strength, tone, movements noted in Neurologic exam below  NEUROLOGIC: MENTAL STATUS:     12/03/2022    4:24 PM  MMSE - Mini Mental State Exam  Orientation to time 5  Orientation to Place 5  Registration 3  Attention/ Calculation 5  Recall 1  Language- name 2 objects 2  Language- repeat 1  Language- follow 3 step command 3  Language- read & follow direction 0  Write a sentence 1  Copy design 1  Total score 27   awake, alert, oriented to person, place and time recent and remote memory intact normal attention and concentration language fluent, comprehension intact, naming intact fund of knowledge appropriate  CRANIAL NERVE:  2nd - no papilledema on fundoscopic exam 2nd, 3rd, 4th, 6th - pupils equal and reactive to light, visual fields full to confrontation, extraocular muscles intact, no nystagmus 5th - facial sensation symmetric 7th - facial strength symmetric 8th - hearing intact 9th -  palate elevates symmetrically, uvula midline 11th - shoulder shrug symmetric 12th - tongue protrusion midline  MOTOR:  normal bulk and tone, full strength in the BUE, BLE  SENSORY:  normal and symmetric to light touch, temperature, vibration  COORDINATION:  finger-nose-finger, fine finger movements normal  REFLEXES:  deep tendon reflexes present and symmetric  GAIT/STATION:  narrow based gait     DIAGNOSTIC DATA (LABS, IMAGING, TESTING) - I reviewed patient records, labs, notes, testing and imaging myself where available.  Lab Results  Component Value Date   WBC 6.9 06/19/2019   HGB 10.9 (L) 06/19/2019   HCT 32.2 (L) 06/19/2019   MCV 93.3 06/19/2019   PLT 154 06/19/2019      Component Value Date/Time   NA 140 06/19/2019 0332   NA 141 05/19/2019 1354   K 3.8 06/19/2019 0332   CL 102 06/19/2019 0332   CO2 29 06/19/2019 0332   GLUCOSE 107 (H) 06/19/2019 0332   BUN 11 06/19/2019 0332   BUN 20 05/19/2019 1354   CREATININE 0.77 06/19/2019 0332   CALCIUM 8.3 (L) 06/19/2019 0332   PROT 6.3 (L) 06/10/2019 1225   PROT 6.2 02/10/2019 1429   ALBUMIN 3.9 06/10/2019 1225   ALBUMIN 4.5 02/10/2019 1429   AST 26 06/10/2019 1225   ALT 18 06/10/2019 1225   ALKPHOS 67 06/10/2019 1225   BILITOT 0.4 06/10/2019 1225   BILITOT 0.5 02/10/2019 1429   GFRNONAA >60 06/19/2019 0332   GFRAA >60 06/19/2019 0332   Lab Results  Component Value Date   CHOL 169 02/10/2019   HDL 81 02/10/2019   LDLCALC 73 02/10/2019   TRIG 80 02/10/2019   CHOLHDL 2.1 02/10/2019   Lab Results  Component Value Date   HGBA1C 5.3 06/10/2019   No results found for: "VITAMINB12" Lab Results  Component Value Date   TSH 1.310 02/10/2019       ASSESSMENT AND PLAN  82 y.o. year old male here with mild progressive short-term memory loss, recall difficulty, in last 6 to 12 months, with MMSE 27 out of 30.  However cognitive changes have somewhat rapidly changed according to wife and other family  members, especially compared to his prior high functioning level.   Dx:  1.  Memory loss      PLAN:  MEMORY LOSS (possible MCI vs mild dementia vs other secondary causes) - check MRI and lab testing - continue galantamine; consider memantine in future; consider lequembi or kisunla - safety / supervision issues reviewed - daily physical activity / exercise (at least 15-30 minutes) - eat more plants / vegetables - increase social activities, brain stimulation, games, puzzles, hobbies, crafts, arts, music - aim for at least 7-8 hours sleep per night (or more) - avoid smoking and alcohol - caregiver resources provided - caution with medications, finances, driving  Orders Placed This Encounter  Procedures   MR BRAIN W WO CONTRAST   ATN PROFILE   Vitamin B12   TSH Rfx on Abnormal to Free T4   Return for pending test results.   I spent 60 minutes of face-to-face and non-face-to-face time with patient.  This included previsit chart review, lab review, study review, order entry, electronic health record documentation, patient education.     Suanne Marker, MD 12/03/2022, 5:32 PM Certified in Neurology, Neurophysiology and Neuroimaging  [facility] [address] [place], [place] [address] [phone]

## 2022-12-03 NOTE — Patient Instructions (Signed)
  MEMORY LOSS (possible MCI vs mild dementia) - check MRI and lab testing - continue galantamine - safety / supervision issues reviewed - daily physical activity / exercise (at least 15-30 minutes) - eat more plants / vegetables - increase social activities, brain stimulation, games, puzzles, hobbies, crafts, arts, music - aim for at least 7-8 hours sleep per night (or more) - avoid smoking and alcohol - caregiver resources provided - caution with medications, finances, driving

## 2022-12-04 ENCOUNTER — Telehealth: Payer: Self-pay | Admitting: Diagnostic Neuroimaging

## 2022-12-04 NOTE — Telephone Encounter (Signed)
medicare/BCBS sup NPR sent to [facility] because he has a pacemaker. [phone]

## 2022-12-11 ENCOUNTER — Other Ambulatory Visit (HOSPITAL_BASED_OUTPATIENT_CLINIC_OR_DEPARTMENT_OTHER): Payer: Self-pay

## 2022-12-16 ENCOUNTER — Ambulatory Visit: Payer: Medicare Other

## 2022-12-16 ENCOUNTER — Ambulatory Visit (HOSPITAL_COMMUNITY)
Admission: RE | Admit: 2022-12-16 | Discharge: 2022-12-16 | Disposition: A | Discharge status: Home / Self Care | Payer: Medicare Other | Source: Ambulatory Visit | Attending: Diagnostic Neuroimaging | Admitting: Diagnostic Neuroimaging

## 2022-12-16 DIAGNOSIS — I495 Sick sinus syndrome: Secondary | ICD-10-CM

## 2022-12-16 DIAGNOSIS — E236 Other disorders of pituitary gland: Secondary | ICD-10-CM | POA: Diagnosis not present

## 2022-12-16 DIAGNOSIS — R413 Other amnesia: Secondary | ICD-10-CM | POA: Diagnosis not present

## 2022-12-16 DIAGNOSIS — I6782 Cerebral ischemia: Secondary | ICD-10-CM | POA: Diagnosis not present

## 2022-12-16 DIAGNOSIS — G319 Degenerative disease of nervous system, unspecified: Secondary | ICD-10-CM | POA: Diagnosis not present

## 2022-12-16 MED ORDER — GADOBUTROL 1 MMOL/ML IV SOLN
8.0000 mL | Freq: Once | INTRAVENOUS | Status: AC | PRN
  Administered 2022-12-16: 8 mL via INTRAVENOUS

## 2022-12-17 ENCOUNTER — Telehealth: Payer: Self-pay | Admitting: Diagnostic Neuroimaging

## 2022-12-17 LAB — CUP PACEART REMOTE DEVICE CHECK
Battery Remaining Longevity: 110 mo
Battery Voltage: 2.99 V
Brady Statistic AP VP Percent: 0.02 %
Brady Statistic AP VS Percent: 75.17 %
Brady Statistic AS VP Percent: 0.02 %
Brady Statistic AS VS Percent: 24.79 %
Brady Statistic RA Percent Paced: 75.56 %
Brady Statistic RV Percent Paced: 0.04 %
Date Time Interrogation Session: 20241007211655
Implantable Lead Connection Status: 753985
Implantable Lead Connection Status: 753985
Implantable Lead Implant Date: 20190805
Implantable Lead Implant Date: 20190805
Implantable Lead Location: 753859
Implantable Lead Location: 753860
Implantable Lead Model: 5076
Implantable Lead Model: 5076
Implantable Pulse Generator Implant Date: 20190805
Lead Channel Impedance Value: 285 Ohm
Lead Channel Impedance Value: 342 Ohm
Lead Channel Impedance Value: 418 Ohm
Lead Channel Impedance Value: 437 Ohm
Lead Channel Pacing Threshold Amplitude: 0.625 V
Lead Channel Pacing Threshold Amplitude: 0.75 V
Lead Channel Pacing Threshold Pulse Width: 0.4 ms
Lead Channel Pacing Threshold Pulse Width: 0.4 ms
Lead Channel Sensing Intrinsic Amplitude: 3.5 mV
Lead Channel Sensing Intrinsic Amplitude: 3.5 mV
Lead Channel Sensing Intrinsic Amplitude: 3.875 mV
Lead Channel Sensing Intrinsic Amplitude: 3.875 mV
Lead Channel Setting Pacing Amplitude: 2 V
Lead Channel Setting Pacing Amplitude: 2.5 V
Lead Channel Setting Pacing Pulse Width: 0.4 ms
Lead Channel Setting Sensing Sensitivity: 1.2 mV
Zone Setting Status: 755011
Zone Setting Status: 755011

## 2022-12-17 NOTE — Telephone Encounter (Signed)
Called the patient's wife back. She is really anxious and worried because they received a call from Dr Perini's office indicating that he had a elevated PTAU. I have asked the medical records personnel to retrieve the lab work that was completed through PCP office.  Pt's wife has states there has been a drastic decline in his actions and is concerned about getting things addressed and getting him properly treated.  She is asking that Dr Marjory Lies review the MRI images asap along with the labs and determine what is next steps for the pt. He currently is prescribed Galantamine 8 mg ER and takes that once a day. She wasn't sure if this is a medication that can be increased and she is questioning about whether he is a candidate for Leqembi. Advised that there is a lot of factors that are looked at when considering the infusion. Informed her that I would being this to Dr [facility] attention. She stated once more that she feels this is a urgent matter and I advised I would make sure to pass that along as well.

## 2022-12-17 NOTE — Telephone Encounter (Signed)
Pt's wife has called asking that there be priority placed on Dr Marjory Lies getting hold to the results of the MRI from yesterday.  Wife states she has noticed rapid progression re: pt's dementia.  She is wanting to discuss a PTAU test and also wants to know if pt's diagnosis is in the early enough states that he would be able to get the infusion or transfusion to help, she does not recall the name of it, only that it begins with an A, she believes 1 name for it may be LEQEMBI.  Wife is asking to be called at [phone]

## 2022-12-18 ENCOUNTER — Encounter: Payer: Self-pay | Admitting: Neurology

## 2022-12-18 NOTE — Telephone Encounter (Signed)
Sent a mychart message to the family to make them aware we have not yet received the report from Dr [employer] office and the radiologist has not read the official report from the MRI.

## 2022-12-23 NOTE — Telephone Encounter (Signed)
Called Dr Pernini's CMA, Lawson Fiscal and left a detailed message requesting lab results to be forwarded to our office. Provided the fax number to me directly as well as instructed them to call if any questions or concerns.

## 2022-12-31 NOTE — Telephone Encounter (Signed)
Received a call from the radiologist stating the impression 1 was a call report. Will pass along to Dr. Marjory Lies. The wife has called wanting to discuss with MD the results and moving forward with IV medication if thinks that is necessary. Will route to Dr Marjory Lies to review.  IMPRESSION: 1. 4 mm lesion within the posterior aspect of the pituitary gland, as described. This may reflect a cyst or pituitary microadenoma. No further imaging evaluation or imaging follow-up is necessary. Correlate with history of pituitary hypersecretion. This follows ACR consensus guidelines: Management of Incidental Pituitary Findings on CT, MRI and F18-FDG PET: A White Paper of the ACR Incidental Findings Committee. J Am Coll Radiol 2018; 15: 966-72. 2. Mild chronic small ischemic changes within the cerebral white matter. 3. Mild generalized cerebral atrophy.

## 2023-01-01 NOTE — Telephone Encounter (Signed)
I called patient.  No answer.  Will try later.  MRI shows some mild nonspecific changes.  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  Single right frontal chronic cerebral microhemorrhage.  Also of minimal chronic small vessel ischemic disease changes.  Patient was noted to have elevated [id number] testing via PCP.  Recommend to follow-up with ATN testing panel and possible amyloid PET scan, as well as ApoE testing. Also could consider neuropsychology testing. Then may consider amyloid targeting antibody therapy.   Suanne Marker, MD 01/01/2023, 4:39 PM Certified in Neurology, Neurophysiology and Neuroimaging  [facility] [address] [place], [place] [address] [phone]

## 2023-01-01 NOTE — Progress Notes (Signed)
Remote pacemaker transmission.   

## 2023-01-13 ENCOUNTER — Ambulatory Visit: Payer: Medicare Other | Attending: Cardiology | Admitting: Cardiology

## 2023-01-13 ENCOUNTER — Encounter: Payer: Self-pay | Admitting: Cardiology

## 2023-01-13 VITALS — BP 100/64 | HR 63 | Ht 69.0 in | Wt 173.8 lb

## 2023-01-13 DIAGNOSIS — I495 Sick sinus syndrome: Secondary | ICD-10-CM

## 2023-01-13 DIAGNOSIS — I10 Essential (primary) hypertension: Secondary | ICD-10-CM

## 2023-01-13 LAB — CUP PACEART INCLINIC DEVICE CHECK
Date Time Interrogation Session: 20241105141656
Implantable Lead Connection Status: 753985
Implantable Lead Connection Status: 753985
Implantable Lead Implant Date: 20190805
Implantable Lead Implant Date: 20190805
Implantable Lead Location: 753859
Implantable Lead Location: 753860
Implantable Lead Model: 5076
Implantable Lead Model: 5076
Implantable Pulse Generator Implant Date: 20190805

## 2023-01-13 NOTE — Patient Instructions (Signed)
Medication Instructions:  Your physician recommends that you continue on your current medications as directed. Please refer to the Current Medication list given to you today. *If you need a refill on your cardiac medications before your next appointment, please call your pharmacy*  Follow-Up: At [facility], you and your health needs are our priority.  As part of our continuing mission to provide you with exceptional heart care, we have created designated Provider Care Teams.  These Care Teams include your primary Cardiologist (physician) and Advanced Practice Providers (APPs -  Physician Assistants and Nurse Practitioners) who all work together to provide you with the care you need, when you need it.  We recommend signing up for the patient portal called "MyChart".  Sign up information is provided on this After Visit Summary.  MyChart is used to connect with patients for Virtual Visits (Telemedicine).  Patients are able to view lab/test results, encounter notes, upcoming appointments, etc.  Non-urgent messages can be sent to your provider as well.   To learn more about what you can do with MyChart, go to [email or web address].    Your next appointment:   1 year(s)  Provider:   Allegra Lai, MD

## 2023-01-13 NOTE — Progress Notes (Signed)
  Electrophysiology Office Note:   Date:  01/13/2023  ID:  Alexander [name], Alexander [name] [birth year], MRN [id number]  Primary Cardiologist: Tobias Alexander, MD Electrophysiologist: Regan Lemming, MD      History of Present Illness:   Alexander [name] is a 82 y.o. male with h/o pretension, hyperlipidemia, mitral regurgitation post annuloplasty ring, syncope post pacemaker seen today for routine electrophysiology followup.   Since last being seen in our clinic the patient reports doing well.  He is able to do all of his daily activities.  He recently went on a hiking trip in [place] and had no issue up and down the mountains.  No acute complaints..  he denies chest pain, palpitations, dyspnea, PND, orthopnea, nausea, vomiting, dizziness, syncope, edema, weight gain, or early satiety.   Review of systems complete and found to be negative unless listed in HPI.      EP Information / Studies Reviewed:    EKG is not ordered today. EKG from 09/04/22 reviewed which showed atrial paced, RBBB      PPM Interrogation-  reviewed in detail today,  See PACEART report.  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  Risk Assessment/Calculations:              Physical Exam:   VS:  BP 100/64 (BP Location: Left Arm, Patient Position: Sitting, Cuff Size: Normal)   Pulse 63   Ht 5\' 9"  (1.753 m)   Wt 173 lb 12.8 oz (78.8 kg)   SpO2 96%   BMI 25.67 kg/m    Wt Readings from Last 3 Encounters:  01/13/23 173 lb 12.8 oz (78.8 kg)  12/03/22 170 lb (77.1 kg)  11/05/22 168 lb (76.2 kg)     GEN: Well nourished, well developed in no acute distress NECK: No JVD; No carotid bruits CARDIAC: Regular rate and rhythm, no murmurs, rubs, gallops RESPIRATORY:  Clear to auscultation without rales, wheezing or rhonchi  ABDOMEN: Soft, non-tender, non-distended EXTREMITIES:  No edema; No deformity   ASSESSMENT AND PLAN:    SND s/p Medtronic PPM  Normal PPM  function See Pace Art report No changes today  2.  Hypertension: Well-controlled  3.  Hyperlipidemia: Continue Crestor  4.  Mitral regurgitation: Stable on most recent echo.  Plan per primary cardiology.  5.  Postoperative atrial fibrillation: No further episodes.  Disposition:   Follow up with Dr. Elberta Fortis in 12 months  Signed, Gina Costilla Jorja Loa, MD

## 2023-01-14 ENCOUNTER — Other Ambulatory Visit: Payer: Self-pay | Admitting: Diagnostic Neuroimaging

## 2023-01-14 ENCOUNTER — Other Ambulatory Visit (INDEPENDENT_AMBULATORY_CARE_PROVIDER_SITE_OTHER): Payer: Self-pay

## 2023-01-14 DIAGNOSIS — Z0289 Encounter for other administrative examinations: Secondary | ICD-10-CM

## 2023-01-14 DIAGNOSIS — R413 Other amnesia: Secondary | ICD-10-CM | POA: Diagnosis not present

## 2023-01-14 NOTE — Progress Notes (Signed)
Orders Placed This Encounter  Procedures   NM PET Brain Amyloid   APOE Alzheimer's Risk    Suanne Marker, MD 01/14/2023, 2:40 PM Certified in Neurology, Neurophysiology and Neuroimaging  [facility] [address] [place], [place] [address] [phone]

## 2023-01-14 NOTE — Telephone Encounter (Signed)
The patient's wife called and asked for a call back to discuss getting the patient started on Lequembi ASAP because he is declining fast. She said he had labs done with Dr. Waynard Edwards. I let her know what Baird Lyons said she would call her back after lunch. She was frustrated that our office is so hard to get on the phone. Please call her at   [phone]

## 2023-01-14 NOTE — Telephone Encounter (Signed)
Called the patient's wife back. She states that she is ready to consider moving forward with the leqembi for the patient. Advised there appears to be a few things that we would still need to complete before we can move forward.  He needs the full ATN profile, because the one completed by PCP only looked at the P Tau. Advised that the patient will need to come in for the blood work and sign papers to get him started.  Advised there may be more testing needed to get auth for the medication but will have him complete the paper work so that we have it ready to go

## 2023-01-15 ENCOUNTER — Telehealth: Payer: Self-pay | Admitting: Diagnostic Neuroimaging

## 2023-01-15 NOTE — Telephone Encounter (Signed)
medicare/BCBS sup NPR sent to [place] [place] [phone] 

## 2023-01-16 DIAGNOSIS — H903 Sensorineural hearing loss, bilateral: Secondary | ICD-10-CM | POA: Diagnosis not present

## 2023-01-16 DIAGNOSIS — H6123 Impacted cerumen, bilateral: Secondary | ICD-10-CM | POA: Diagnosis not present

## 2023-01-28 LAB — APOE ALZHEIMER'S RISK

## 2023-01-28 NOTE — Telephone Encounter (Signed)
Called the wife to advise that the paper work still needs to be signed by the pt. There was no answer. Paperwork is at the front waiting signature. LVM advising this.

## 2023-01-30 ENCOUNTER — Encounter (HOSPITAL_COMMUNITY)
Admission: RE | Admit: 2023-01-30 | Discharge: 2023-01-30 | Disposition: A | Discharge status: Home / Self Care | Payer: Medicare Other | Source: Ambulatory Visit | Attending: Diagnostic Neuroimaging | Admitting: Diagnostic Neuroimaging

## 2023-01-30 DIAGNOSIS — R413 Other amnesia: Secondary | ICD-10-CM | POA: Insufficient documentation

## 2023-02-02 ENCOUNTER — Encounter (HOSPITAL_COMMUNITY)
Admission: RE | Admit: 2023-02-02 | Discharge: 2023-02-02 | Disposition: A | Discharge status: Home / Self Care | Payer: Medicare Other | Source: Ambulatory Visit | Attending: Diagnostic Neuroimaging | Admitting: Diagnostic Neuroimaging

## 2023-02-02 DIAGNOSIS — I680 Cerebral amyloid angiopathy: Secondary | ICD-10-CM | POA: Diagnosis not present

## 2023-02-02 DIAGNOSIS — R413 Other amnesia: Secondary | ICD-10-CM | POA: Diagnosis not present

## 2023-02-02 MED ORDER — FLORBETAPIR F 18 500-1900 MBQ/ML IV SOLN
8.4070 | Freq: Once | INTRAVENOUS | Status: AC
Start: 2023-02-02 — End: 2023-02-02
  Administered 2023-02-02: 8.407 via INTRAVENOUS

## 2023-02-09 ENCOUNTER — Telehealth: Payer: Self-pay | Admitting: Diagnostic Neuroimaging

## 2023-02-09 ENCOUNTER — Other Ambulatory Visit (INDEPENDENT_AMBULATORY_CARE_PROVIDER_SITE_OTHER): Payer: Self-pay

## 2023-02-09 DIAGNOSIS — R413 Other amnesia: Secondary | ICD-10-CM | POA: Diagnosis not present

## 2023-02-09 DIAGNOSIS — Z0289 Encounter for other administrative examinations: Secondary | ICD-10-CM

## 2023-02-09 NOTE — Telephone Encounter (Signed)
Called the patient back and I was able to catch him before he got to far away. He is coming back in to have more lab work

## 2023-02-09 NOTE — Addendum Note (Signed)
Addended by: Marlana Latus C on: 02/09/2023 11:10 AM   Modules accepted: Orders

## 2023-02-09 NOTE — Telephone Encounter (Signed)
Once he completes this lab work and the [employer]. I will then be able to turn everything in to Dr Marjory Lies for him to decide about moving forward with the infusion.

## 2023-02-09 NOTE — Addendum Note (Signed)
Addended by: Judi Cong on: 02/09/2023 11:13 AM   Modules accepted: Orders

## 2023-02-09 NOTE — Telephone Encounter (Signed)
Pt stopped in and wants to be seen and this week and start any treatments ASAP. He said he saw his PET scan results and they are positive for Alzheimers and wants to get started immediately on treatment.  Says his wife is friends with Baird Lyons and would like them to call her so they can get this going.

## 2023-02-10 LAB — VITAMIN B12: Vitamin B-12: 523 pg/mL (ref 232–1245)

## 2023-02-10 LAB — TSH RFX ON ABNORMAL TO FREE T4: TSH: 1.73 u[IU]/mL (ref 0.450–4.500)

## 2023-02-12 LAB — ATN PROFILE
A -- Beta-amyloid 42/40 Ratio: 0.106 (ref >0.102)
Beta-amyloid 40: 181.72 pg/mL
Beta-amyloid 42: 19.3 pg/mL
N -- NfL, Plasma: 2.76 pg/mL (ref 0.00–11.55)
T -- p-tau181: 1.01 pg/mL — ABNORMAL HIGH (ref 0.00–0.97)

## 2023-02-12 NOTE — Telephone Encounter (Signed)
Made f/u appointment with Dr. Marjory Lies 02/16/2023

## 2023-02-12 NOTE — Telephone Encounter (Addendum)
Spoke to pt concerned about labwork results that were done 02/09/2023 Requesting a sooner appointment. Forward message to Dr Marjory Lies

## 2023-02-16 ENCOUNTER — Ambulatory Visit (INDEPENDENT_AMBULATORY_CARE_PROVIDER_SITE_OTHER): Payer: Medicare Other | Admitting: Cardiovascular Disease

## 2023-02-16 ENCOUNTER — Encounter: Payer: Self-pay | Admitting: Diagnostic Neuroimaging

## 2023-02-16 ENCOUNTER — Ambulatory Visit (INDEPENDENT_AMBULATORY_CARE_PROVIDER_SITE_OTHER): Payer: Medicare Other | Admitting: Diagnostic Neuroimaging

## 2023-02-16 ENCOUNTER — Encounter (HOSPITAL_BASED_OUTPATIENT_CLINIC_OR_DEPARTMENT_OTHER): Payer: Self-pay | Admitting: Cardiovascular Disease

## 2023-02-16 VITALS — BP 111/65 | HR 68 | Ht 69.0 in | Wt 175.0 lb

## 2023-02-16 VITALS — BP 124/72 | HR 74 | Ht 69.0 in | Wt 175.4 lb

## 2023-02-16 DIAGNOSIS — E7849 Other hyperlipidemia: Secondary | ICD-10-CM

## 2023-02-16 DIAGNOSIS — I10 Essential (primary) hypertension: Secondary | ICD-10-CM | POA: Diagnosis not present

## 2023-02-16 DIAGNOSIS — R0683 Snoring: Secondary | ICD-10-CM

## 2023-02-16 DIAGNOSIS — R400 Somnolence: Secondary | ICD-10-CM

## 2023-02-16 DIAGNOSIS — G4733 Obstructive sleep apnea (adult) (pediatric): Secondary | ICD-10-CM | POA: Diagnosis not present

## 2023-02-16 DIAGNOSIS — G309 Alzheimer's disease, unspecified: Secondary | ICD-10-CM | POA: Diagnosis not present

## 2023-02-16 DIAGNOSIS — I455 Other specified heart block: Secondary | ICD-10-CM | POA: Diagnosis not present

## 2023-02-16 DIAGNOSIS — I7121 Aneurysm of the ascending aorta, without rupture: Secondary | ICD-10-CM | POA: Diagnosis not present

## 2023-02-16 DIAGNOSIS — G3184 Mild cognitive impairment, so stated: Secondary | ICD-10-CM

## 2023-02-16 DIAGNOSIS — I341 Nonrheumatic mitral (valve) prolapse: Secondary | ICD-10-CM

## 2023-02-16 DIAGNOSIS — I340 Nonrheumatic mitral (valve) insufficiency: Secondary | ICD-10-CM

## 2023-02-16 NOTE — Progress Notes (Signed)
[facility]  PATIENT: Alexander [name] DOB: Jun 24, 1940  REFERRING CLINICIAN: Rodrigo Ran, MD HISTORY FROM: patient and wife REASON FOR VISIT: FOLLOW UP   HISTORICAL  CHIEF COMPLAINT:  Chief Complaint  Patient presents with   Follow-up    Pt in 7 with wife  Pt wants to discuss pet scan and treatment for Alzheimers Pt wants laquembi      HISTORY OF PRESENT ILLNESS:   UPDATE (02/16/2023, VRP): Since last visit, doing about the same.  Continues with some mild forgetfulness issues.  Wife notes more problems.  He is able to maintain most of his ADLs.  However she cannot rely on him to follow-up on to do list tasks like previously.  Testing has been completed.  Here to discuss amyloid targeting antibiotic therapy.  PRIOR HPI: 82 year old male, retired Investment banker, operational, here for evaluation of memory loss.  Past 6 to 12 months patient has had onset of mild short-term memory loss, having difficulty remembering names, appointments and conversations.   Had an incident for his recent primary care office visit where he showed up at the wrong time.  This is unusual for him.  Otherwise he is able to maintain most of his own ADLs.  He is still driving, playing golf on a regular basis, able to be on his own.  However wife is noted that he is starting to become a little bit less reliable when she asks him to do tasks.  They are trying to use reminders, smart phone alarms and other compensation techniques.  At PCP visit on 12/01/2022, concern for possible mild dementia was raised due to family history in his mother.  He was started on galantamine and had phosphorylated tau testing done but not resulted yet.    REVIEW OF SYSTEMS: Full 14 system review of systems performed and negative with exception of: as per HPI.  ALLERGIES: No Known Allergies  HOME MEDICATIONS: Outpatient Medications Prior to Visit  Medication Sig Dispense Refill   ezetimibe (ZETIA) 10 MG tablet Take  10 mg by mouth daily.     galantamine (RAZADYNE ER) 8 MG 24 hr capsule Take 8 mg by mouth every morning.     inclisiran (LEQVIO) 284 MG/1.5ML SOSY injection Inject 1.70ml every 6 months 1.5 mL 0   Lecanemab-irmb (LEQEMBI IV) Inject into the vein every 14 (fourteen) days.     metoprolol succinate (TOPROL-XL) 50 MG 24 hr tablet Take 1 tablet (50 mg total) by mouth at bedtime. Take with or immediately following a meal. 30 tablet 3   pneumococcal 20-valent conjugate vaccine (PREVNAR 20) 0.5 ML injection Inject 0.5 mLs into the muscle. 0.5 mL 0   zolpidem (AMBIEN) 10 MG tablet Take 5-10 mg by mouth as needed.     COVID-19 mRNA vaccine 2023-2024 (COMIRNATY) syringe Inject into the muscle. 0.3 mL 0   influenza vac split quadrivalent PF (FLUARIX) 0.5 ML injection Inject into the muscle. 0.5 mL 0   influenza vaccine adjuvanted (FLUAD) 0.5 ML injection Inject into the muscle. 0.5 mL 0   influenza vaccine adjuvanted (FLUAD) 0.5 ML injection Inject into the muscle. 0.5 mL 0   RSV vaccine recomb adjuvanted (AREXVY) 120 MCG/0.5ML injection Inject into the muscle. 0.5 mL 0   Tdap (BOOSTRIX) 5-2.5-18.5 LF-MCG/0.5 injection Inject into the muscle. 0.5 mL 0   No facility-administered medications prior to visit.    PAST MEDICAL HISTORY: Past Medical History:  Diagnosis Date   Heart murmur    Hyperlipidemia    Hypertension  Mitral regurgitation    MVP (mitral valve prolapse)    OSA (obstructive sleep apnea)    AHI of 6.78/hr and O2 sats as low as 84%.  His OSA was moderate during REM sleep at 15.4/hr and 10.6/hr during supine sleep   Presence of permanent cardiac pacemaker    Snoring 09/07/2019   Syncope     PAST SURGICAL HISTORY: Past Surgical History:  Procedure Laterality Date   BUBBLE STUDY  05/23/2019   Procedure: BUBBLE STUDY;  Surgeon: Lars Masson, MD;  Location: [facility] ENDOSCOPY;  Service: Cardiovascular;;   HERNIA REPAIR     MITRAL VALVE REPAIR Right 06/14/2019   Procedure: MINIMALLY  INVASIVE MITRAL VALVE REPAIR (MVR)<TEE;  Surgeon: Purcell Nails, MD;  Location: [facility] OR;  Service: Open Heart Surgery;  Laterality: Right;   PACEMAKER IMPLANT N/A 10/12/2017   Procedure: PACEMAKER IMPLANT;  Surgeon: Regan Lemming, MD;  Location: [facility] INVASIVE CV LAB;  Service: Cardiovascular;  Laterality: N/A;   RIGHT/LEFT HEART CATH AND CORONARY ANGIOGRAPHY N/A 06/06/2019   Procedure: RIGHT/LEFT HEART CATH AND CORONARY ANGIOGRAPHY;  Surgeon: Kathleene Hazel, MD;  Location: [facility] INVASIVE CV LAB;  Service: Cardiovascular;  Laterality: N/A;   TEE WITHOUT CARDIOVERSION N/A 05/23/2019   Procedure: TRANSESOPHAGEAL ECHOCARDIOGRAM (TEE);  Surgeon: Lars Masson, MD;  Location: [facility] ENDOSCOPY;  Service: Cardiovascular;  Laterality: N/A;   TEE WITHOUT CARDIOVERSION N/A 06/14/2019   Procedure: TRANSESOPHAGEAL ECHOCARDIOGRAM (TEE);  Surgeon: Purcell Nails, MD;  Location[facility] OR;  Service: Open Heart Surgery;  Laterality: N/A;   TONSILECTOMY, ADENOIDECTOMY, BILATERAL MYRINGOTOMY AND TUBES     as child   TONSILLECTOMY     TOTAL KNEE ARTHROPLASTY Left    TOTAL KNEE ARTHROPLASTY Left 02/23/2019   Procedure: TOTAL KNEE ARTHROPLASTY;  Surgeon: Ollen Gross, MD;  Location: [facility] ORS;  Service: Orthopedics;  Laterality: Left;     FAMILY HISTORY: Family History  Problem Relation Age of Onset   Alzheimer's disease Mother    Hypertension Father     SOCIAL HISTORY: Social History   Socioeconomic History   Marital status: Married    Spouse name: Not on file   Number of children: Not on file   Years of education: Not on file   Highest education level: Not on file  Occupational History   Not on file  Tobacco Use   Smoking status: Never   Smokeless tobacco: Never  Vaping Use   Vaping status: Never Used  Substance and Sexual Activity   Alcohol use: Yes    Alcohol/week: 5.0 standard drinks of alcohol    Types: 5 Glasses of wine per week   Drug use: No   Sexual activity: Not on file   Other Topics Concern   Not on file  Social History Narrative   Pt lives with wife    Retired    Chief Executive Officer Determinants of Corporate investment banker Strain: Not on file  Food Insecurity: Low Risk  (01/16/2023)   Received from [facility]   Hunger Vital Sign    Worried About Running Out of Food in the Last Year: Never true    Ran Out of Food in the Last Year: Never true  Transportation Needs: No Transportation Needs (01/16/2023)   Received from [facility]    In the past 12 months, has lack of reliable transportation kept you from medical appointments, meetings, work or from getting things needed for daily living? : No  Physical Activity: Not on file  Stress: Not on file  Social Connections: Not on file  Intimate Partner Violence: Not on file     PHYSICAL EXAM  GENERAL EXAM/CONSTITUTIONAL: Vitals:  Vitals:   02/16/23 1505  BP: 111/65  Pulse: 68  Weight: 175 lb (79.4 kg)  Height: 5\' 9"  (1.753 m)   Body mass index is 25.84 kg/m. Wt Readings from Last 3 Encounters:  02/16/23 175 lb (79.4 kg)  02/16/23 175 lb 6.4 oz (79.6 kg)  01/13/23 173 lb 12.8 oz (78.8 kg)   Patient is in no distress; well developed, nourished and groomed; neck is supple  CARDIOVASCULAR: Examination of carotid arteries is normal; no carotid bruits Regular rate and rhythm, no murmurs Examination of peripheral vascular system by observation and palpation is normal  EYES: Ophthalmoscopic exam of optic discs and posterior segments is normal; no papilledema or hemorrhages No results found.  MUSCULOSKELETAL: Gait, strength, tone, movements noted in Neurologic exam below  NEUROLOGIC: MENTAL STATUS:     12/03/2022    4:24 PM  MMSE - Mini Mental State Exam  Orientation to time 5  Orientation to Place 5  Registration 3  Attention/ Calculation 5  Recall 1  Language- name 2 objects 2  Language- repeat 1  Language- follow 3 step command 3  Language- read & follow direction 0   Write a sentence 1  Copy design 1  Total score 27   awake, alert, oriented to person, place and time recent and remote memory intact normal attention and concentration language fluent, comprehension intact, naming intact fund of knowledge appropriate  CRANIAL NERVE:  2nd - no papilledema on fundoscopic exam 2nd, 3rd, 4th, 6th - pupils equal and reactive to light, visual fields full to confrontation, extraocular muscles intact, no nystagmus 5th - facial sensation symmetric 7th - facial strength symmetric 8th - hearing intact 9th - palate elevates symmetrically, uvula midline 11th - shoulder shrug symmetric 12th - tongue protrusion midline  MOTOR:  normal bulk and tone, full strength in the BUE, BLE  SENSORY:  normal and symmetric to light touch, temperature, vibration  COORDINATION:  finger-nose-finger, fine finger movements normal  REFLEXES:  deep tendon reflexes present and symmetric  GAIT/STATION:  narrow based gait     DIAGNOSTIC DATA (LABS, IMAGING, TESTING) - I reviewed patient records, labs, notes, testing and imaging myself where available.  Lab Results  Component Value Date   WBC 6.9 06/19/2019   HGB 10.9 (L) 06/19/2019   HCT 32.2 (L) 06/19/2019   MCV 93.3 06/19/2019   PLT 154 06/19/2019      Component Value Date/Time   NA 140 06/19/2019 0332   NA 141 05/19/2019 1354   K 3.8 06/19/2019 0332   CL 102 06/19/2019 0332   CO2 29 06/19/2019 0332   GLUCOSE 107 (H) 06/19/2019 0332   BUN 11 06/19/2019 0332   BUN 20 05/19/2019 1354   CREATININE 0.77 06/19/2019 0332   CALCIUM 8.3 (L) 06/19/2019 0332   PROT 6.3 (L) 06/10/2019 1225   PROT 6.2 02/10/2019 1429   ALBUMIN 3.9 06/10/2019 1225   ALBUMIN 4.5 02/10/2019 1429   AST 26 06/10/2019 1225   ALT 18 06/10/2019 1225   ALKPHOS 67 06/10/2019 1225   BILITOT 0.4 06/10/2019 1225   BILITOT 0.5 02/10/2019 1429   GFRNONAA >60 06/19/2019 0332   GFRAA >60 06/19/2019 0332   Lab Results  Component Value Date    CHOL 169 02/10/2019   HDL 81 02/10/2019   LDLCALC 73 02/10/2019   TRIG 80 02/10/2019  CHOLHDL 2.1 02/10/2019   Lab Results  Component Value Date   HGBA1C 5.3 06/10/2019   Lab Results  Component Value Date   VITAMINB12 523 02/09/2023   Lab Results  Component Value Date   TSH 1.730 02/09/2023   02/09/23     Component Ref Range & Units 7 d ago  A -- Beta-amyloid 42/40 Ratio >0.102 0.106  Beta-amyloid 42 pg/mL 19.30  Beta-amyloid 40 pg/mL 181.72  T -- p-tau181 0.00 - 0.97 pg/mL 1.01 High   N -- NfL, Plasma 0.00 - 11.55 pg/mL 2.76  ATN SUMMARY Comment  Comment:                        A- T+ N- A high pTau181 concentration was observed. A normal beta-amyloid 42/40 ratio and normal concentration of NfL were observed at this time. These results are not consistent with the presence of Alzheimer's- related pathology, but may indicate pathology of another condition. Additional assessments may be necessary. These tests are intended to be used only in the context of clinical care.   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  12/16/22 MRI brain [I reviewed images myself and agree with interpretation. Total of 3 chronic cerebral microhemorrhages. -VRP]  - 2 mm focus of susceptibility-weighted signal loss within the anterior right frontal lobe, likely reflecting a chronic microhemorrhage. Additional punctate chronic microhemorrhages along the medial aspect of the left caudate nucleus and within the right cerebellar hemisphere. 1. 4 mm lesion within the posterior aspect of the pituitary gland, as described. This may reflect a cyst or pituitary microadenoma. No further imaging evaluation or imaging follow-up is necessary. Correlate with history of pituitary  hypersecretion. This follows ACR consensus guidelines: Management of Incidental Pituitary Findings on CT, MRI and F18-FDG PET: A White Paper of the ACR Incidental Findings Committee. J Am Coll Radiol 2018; 15: 966-72. 2. Mild chronic small ischemic changes within the cerebral white matter. 3. Mild generalized cerebral atrophy.    ASSESSMENT AND PLAN  82 y.o. year old male here with mild progressive short-term memory loss, recall difficulty, in last 6 to 12 months, with MMSE 27 out of 30.  However cognitive changes have somewhat rapidly changed according to wife and other family members, especially compared to his prior high functioning level.  Dx:  1. 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)     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) - consider lequembi or kisunla; extensive discussion regarding potential efficacy versus side effects including small risk for amyloid related imaging abnormalities (ARIA) H and E types; they will review information and let me know later this week - continue galantamine; consider memantine in future - safety / supervision issues reviewed - daily physical activity / exercise (at least 15-30 minutes) - eat more plants / vegetables - increase social activities, brain stimulation, games, puzzles, hobbies, crafts, arts, music - aim for at least 7-8 hours sleep per night (or more) - avoid smoking and alcohol - caregiver resources provided - caution with medications, finances, driving  Return for pending if symptoms worsen or fail to improve.   I spent 45 minutes of face-to-face and non-face-to-face time with patient.  This included previsit chart review, lab review, study  review, order entry, electronic health record documentation, patient education.     Suanne Marker, MD 02/16/2023, 3:18 PM Certified in Neurology, Neurophysiology and Neuroimaging  [facility] [address], [address] [place], [place] [address] [phone]

## 2023-02-16 NOTE — Patient Instructions (Signed)
  MILD MEMORY LOSS (suspect MCI vs mild dementia due alzheimer's pathology) - consider lequembi or kisunla; extensive discussion regarding potential efficacy versus side effects including risks for amyloid related imaging abnormalities (ARIA) H and E types; they will review information and let me know later this week - continue galantamine; consider memantine in future - safety / supervision issues reviewed - daily physical activity / exercise (at least 15-30 minutes) - eat more plants / vegetables - increase social activities, brain stimulation, games, puzzles, hobbies, crafts, arts, music - aim for at least 7-8 hours sleep per night (or more) - avoid smoking and alcohol - caregiver resources provided - caution with medications, finances, driving

## 2023-02-16 NOTE — Progress Notes (Signed)
Cardiology Office Note:  .   Date:  02/16/2023  ID:  Alexander [name], DOB August 23, 1940, MRN [id number] PCP: Alexander Ran, MD  [facility] Providers Cardiologist:  Alexander Si, MD Electrophysiologist:  Alexander Jorja Loa, MD  Sleep Medicine:  Alexander Magic, MD    History of Present Illness: .    Alexander [name] is a 82 y.o. 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  He was previously a patient of Dr. Shari [name].  He underwent mitral valve repair 06/2019 by Dr. Freida [name].  He developed postoperative atrial fibrillation was started on amiodarone.  Prior to the procedure he had a catheter revealed no evidence of CAD.  He had a calcium score of 94 which was 27th percentile.  He was treated with 3 months of anticoagulation and has not had known recurrence.  Echo 04/2019 revealed mean gradient of 2 mmHg across the mitral valve and no mitral regurgitation.  LVEF was 50-55%.  Repeat echo 07/2022 revealed LVEF 60-65% with mild RV dysfunction and mean gradient was 2 mmHg.  His ascending aorta was 4.3 cm.  He was last seen in clinic 08/2022 and was doing well.  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  He was started on CPAP.  He last saw Dr. Elberta [name] 01/2023 and his device was stable.  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 He reports feeling well overall, with no complaints of chest pain, pressure, or swelling in the legs or feet. He maintains an active lifestyle, regularly using a treadmill and lifting weights without experiencing fatigue or shortness of breath.  He has a history of mitral valve repair and has been on a regimen of metoprolol and Leqvio, a twice-yearly cholesterol injection. However, there is uncertainty about the  continuation of the Leqvio injections, which needs to be clarified.  These have been prescribed by Dr. Waynard [name].  He is unsure when his cholesterol levels were last checked.   He has a pacemaker due to a history of sinus arrest, which is followed by Dr. Elberta [name]. His most recent check 01/2023 was stable.   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 He is currently on galantamine and is awaiting approval for a new therapy, Lequimbi.  His heart health appears stable, with no new symptoms or complaints. He is due for an echocardiogram in May to monitor the slight dilation of the aorta observed in the previous scan. The patient has stopped taking aspirin due to concern for cerebral bleeding.     ROS:  As per HPI  Studies Reviewed: .       Echo 07/2022:  1. Left ventricular ejection fraction, by estimation, is 60 to 65%. The  left ventricle has normal function. The left ventricle has no regional  wall motion abnormalities. There is mild left ventricular hypertrophy.  Left ventricular diastolic parameters  are indeterminate. The average left ventricular global longitudinal strain  is -19.2 %.   2. Right ventricular systolic function is mildly reduced. The right  ventricular size is normal.   3. The mitral valve has been repaired/replaced. Trivial mitral valve  regurgitation. No evidence of mitral stenosis. The mean mitral valve  gradient is 2.0 mmHg with average heart rate of 60 bpm. There is a  prosthetic annuloplasty ring present in the  mitral position.   4. The aortic valve is tricuspid. Aortic valve regurgitation is not  visualized. No aortic stenosis is present.   5. Aortic dilatation noted. There is dilatation of the ascending aorta,  measuring 43 mm.   Risk Assessment/Calculations:             Physical Exam:   VS:  BP 124/72   Pulse 74   Ht 5\' 9"  (1.753 m)   Wt 175 lb 6.4 oz (79.6 kg)   SpO2 98%   BMI 25.90 kg/m  , BMI Body  mass index is 25.9 kg/m. GENERAL:  Well appearing HEENT: Pupils equal round and reactive, fundi not visualized, oral mucosa unremarkable NECK:  No jugular venous distention, waveform within normal limits, carotid upstroke brisk and symmetric, no bruits, no thyromegaly LUNGS:  Clear to auscultation bilaterally HEART:  RRR.  PMI not displaced or sustained,S1 and S2 within normal limits, no S3, no S4, no clicks, no rubs, no murmurs ABD:  Flat, positive bowel sounds normal in frequency in pitch, no bruits, no rebound, no guarding, no midline pulsatile mass, no hepatomegaly, no splenomegaly EXT:  2 plus pulses throughout, no edema, no cyanosis no clubbing SKIN:  No rashes no nodules NEURO:  Cranial nerves II through XII grossly intact, motor grossly intact throughout PSYCH:  Cognitively intact, oriented to person place and time   ASSESSMENT AND PLAN: .    # Sinus arrest s/p [employer] PPM:  Stable with regular remote monitoring by Dr. Nyra [name]. No reported symptoms of fatigue or shortness of breath with exercise. -Continue current management and remote monitoring.  # Mitral Valve Repair Stable with no reported symptoms. Last echocardiogram in June showed good valve function mean gradient 3 mmHg. -Schedule repeat echocardiogram in May 2025 to monitor for stability.   # Ascending aorta aneurysm:  Mildly dilated.  4.3 cm on echo 07/2022.  Repeat 07/2023.  BP well-controlled.  # Hyperlipidemia Unclear status of cholesterol management with Leqvio (inclisiran) injections. Last cholesterol check in May 2023. -Request recent cholesterol levels from Alexander [name] office. -Clarify status and plan for Leqvio injections  # New Diagnosis of Alzheimer's Disease Recently diagnosed and managed by Dr. Marchelle [name]. Plan to start galantamine and awaiting insurance approval for Leqvio. -Continue management with neurologist.  General Health Maintenance Regular exercise with no reported symptoms of fatigue or  shortness of breath. -Encourage continued regular exercise.      Signed, Alexander Si, MD  With mitral valve prolapse, sinus arrest status post pacemaker, syncope, hypertension, hyperlipidemia, and right bundle branch block

## 2023-02-16 NOTE — Patient Instructions (Signed)
Medication Instructions:  Your physician recommends that you continue on your current medications as directed. Please refer to the Current Medication list given to you today.   *If you need a refill on your cardiac medications before your next appointment, please call your pharmacy*  Lab Work: NONE  Testing/Procedures: HOME [facility] SLEEP STUDY THE OFFICE WILL CALL YOU WITH CODE   Your physician has requested that you have an echocardiogram. Echocardiography is a painless test that uses sound waves to create images of your heart. It provides your doctor with information about the size and shape of your heart and how well your heart's chambers and valves are working. This procedure takes approximately one hour. There are no restrictions for this procedure. Please do NOT wear cologne, perfume, aftershave, or lotions (deodorant is allowed). Please arrive 15 minutes prior to your appointment time.  Please note: We ask at that you not bring children with you during ultrasound (echo/ vascular) testing. Due to room size and safety concerns, children are not allowed in the ultrasound rooms during exams. Our front office staff cannot provide observation of children in our lobby area while testing is being conducted. An adult accompanying a patient to their appointment will only be allowed in the ultrasound room at the discretion of the ultrasound technician under special circumstances. We apologize for any inconvenience. TO BE DONE IN MAY   Follow-Up: At [facility], you and your health needs are our priority.  As part of our continuing mission to provide you with exceptional heart care, we have created designated Provider Care Teams.  These Care Teams include your primary Cardiologist (physician) and Advanced Practice Providers (APPs -  Physician Assistants and Nurse Practitioners) who all work together to provide you with the care you need, when you need it.  We recommend signing up for the  patient portal called "MyChart".  Sign up information is provided on this After Visit Summary.  MyChart is used to connect with patients for Virtual Visits (Telemedicine).  Patients are able to view lab/test results, encounter notes, upcoming appointments, etc.  Non-urgent messages can be sent to your provider as well.   To learn more about what you can do with MyChart, go to [email or web address].    Your next appointment:   12 month(s)  Provider:   Chilton Si, MD or Gillian Shields, NP    Other Instructions WatchPAT?  Is a FDA cleared portable home sleep study test that uses a watch and 3 points of contact to monitor 7 different channels, including your heart rate, oxygen saturations, body position, snoring, and chest motion.  The study is easy to use from the comfort of your own home and accurately detect sleep apnea.  Before bed, you attach the chest sensor, attached the sleep apnea bracelet to your nondominant hand, and attach the finger probe.  After the study, the raw data is downloaded from the watch and scored for apnea events.   For more information: [email or web address]  Patient Testing Instructions:  Do not put battery into the device until bedtime when you are ready to begin the test. Please call the support number if you need assistance after following the instructions below: 24 hour support line- [phone] or [facility] support at [phone] (option 2)  Download the IntelWatchPAT One" app through the google play store or App Store  Be sure to turn on or enable access to bluetooth in settlings on your smartphone/ device  Make sure no other bluetooth devices are on and within  the vicinity of your smartphone/ device and WatchPAT watch during testing.  Make sure to leave your smart phone/ device plugged in and charging all night.  When ready for bed:  Follow the instructions step by step in the WatchPAT One App to activate the testing device. For  additional instructions, including video instruction, visit the WatchPAT One video on Youtube. You can search for WatchPat One within Youtube (video is 4 minutes and 18 seconds) or enter: https://youtube/watch?[email or web address]_vbiwxE Please note: You will be prompted to enter a Pin to connect via bluetooth when starting the test. The PIN will be assigned to you when you receive the test.  The device is disposable, but it recommended that you retain the device until you receive a call letting you know the study has been received and the results have been interpreted.  We will let you know if the study did not transmit to [place] properly after the test is completed. You do not need to call us to confirm the receipt of the test.  Please complete the test within 48 hours of receiving PIN.   Frequently Asked Questions:  What is Watch Dennie Bible one?  A single use fully disposable home sleep apnea testing device and will not need to be returned after completion.  What are the requirements to use WatchPAT one?  The be able to have a successful watchpat one sleep study, you should have your Watch pat one device, your smart phone, watch pat one app, your PIN number and Internet access What type of phone do I need?  You should have a smart phone that uses Android 5.1 and above or any Iphone with IOS 10 and above How can I download the WatchPAT one app?  Based on your device type search for WatchPAT one app either in google play for android devices or APP store for Iphone's Where will I get my PIN for the study?  Your PIN will be provided by your physician's office. It is used for authentication and if you lose/forget your PIN, please reach out to your providers office.  I do not have Internet at home. Can I do WatchPAT one study?  WatchPAT One needs Internet connection throughout the night to be able to transmit the sleep data. You can use your home/local internet or your cellular's data package. However, it is always  recommended to use home/local Internet. It is estimated that between 20MB-30MB will be used with each study.However, the application will be looking for space in the phone to start the study.  What happens if I lose internet or bluetooth connection?  During the internet disconnection, your phone will not be able to transmit the sleep data. All the data, will be stored in your phone. As soon as the internet connection is back on, the phone will being sending the sleep data. During the bluetooth disconnection, WatchPAT one will not be able to to send the sleep data to your phone. Data will be kept in the [facility] one until two devices have bluetooth connection back on. As soon as the connection is back on, WatchPAT one will send the sleep data to the phone.  How long do I need to wear the WatchPAT one?  After you start the study, you should wear the device at least 6 hours.  How far should I keep my phone from the device?  During the night, your phone should be within 15 feet.  What happens if I leave the room for restroom or other  reasons?  Leaving the room for any reason will not cause any problem. As soon as your get back to the room, both devices will reconnect and will continue to send the sleep data. Can I use my phone during the sleep study?  Yes, you can use your phone as usual during the study. But it is recommended to put your watchpat one on when you are ready to go to bed.  How will I get my study results?  A soon as you completed your study, your sleep data will be sent to the provider. They will then share the results with you when they are ready.

## 2023-02-23 ENCOUNTER — Telehealth: Payer: Self-pay | Admitting: Diagnostic Neuroimaging

## 2023-02-23 NOTE — Telephone Encounter (Signed)
Called the wife back and advised that we will move forward with Blanchie Dessert since that is what they would like to do. Order has been signed and turned the orders into [facility].

## 2023-02-23 NOTE — Telephone Encounter (Signed)
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 The medication he is referring to is the once a month.He would like to speak to you to see when can he can start because he is anxious and wants to start ASAP. Please call his wife as listed.

## 2023-03-06 NOTE — Telephone Encounter (Signed)
Pt's wife checking status on Intrafusion order. Sent message to [employer]

## 2023-03-10 ENCOUNTER — Other Ambulatory Visit: Payer: Self-pay | Admitting: Cardiology

## 2023-03-12 DIAGNOSIS — G3184 Mild cognitive impairment, so stated: Secondary | ICD-10-CM | POA: Diagnosis not present

## 2023-03-12 DIAGNOSIS — G309 Alzheimer's disease, unspecified: Secondary | ICD-10-CM | POA: Diagnosis not present

## 2023-03-17 ENCOUNTER — Ambulatory Visit (INDEPENDENT_AMBULATORY_CARE_PROVIDER_SITE_OTHER): Payer: Medicare Other

## 2023-03-17 ENCOUNTER — Other Ambulatory Visit (HOSPITAL_BASED_OUTPATIENT_CLINIC_OR_DEPARTMENT_OTHER): Payer: Self-pay

## 2023-03-17 DIAGNOSIS — I495 Sick sinus syndrome: Secondary | ICD-10-CM

## 2023-03-17 DIAGNOSIS — Z23 Encounter for immunization: Secondary | ICD-10-CM | POA: Diagnosis not present

## 2023-03-17 MED ORDER — COMIRNATY 30 MCG/0.3ML IM SUSY
PREFILLED_SYRINGE | INTRAMUSCULAR | 0 refills | Status: DC
  Filled 2023-03-17: qty 0.3, 1d supply, fill #0

## 2023-03-18 LAB — CUP PACEART REMOTE DEVICE CHECK
Battery Remaining Longevity: 107 mo
Battery Voltage: 2.99 V
Brady Statistic AP VP Percent: 0.02 %
Brady Statistic AP VS Percent: 75.55 %
Brady Statistic AS VP Percent: 0.02 %
Brady Statistic AS VS Percent: 24.41 %
Brady Statistic RA Percent Paced: 75.74 %
Brady Statistic RV Percent Paced: 0.04 %
Date Time Interrogation Session: 20250106225710
Implantable Lead Connection Status: 753985
Implantable Lead Connection Status: 753985
Implantable Lead Implant Date: 20190805
Implantable Lead Implant Date: 20190805
Implantable Lead Location: 753859
Implantable Lead Location: 753860
Implantable Lead Model: 5076
Implantable Lead Model: 5076
Implantable Pulse Generator Implant Date: 20190805
Lead Channel Impedance Value: 304 Ohm
Lead Channel Impedance Value: 342 Ohm
Lead Channel Impedance Value: 361 Ohm
Lead Channel Impedance Value: 418 Ohm
Lead Channel Pacing Threshold Amplitude: 0.625 V
Lead Channel Pacing Threshold Amplitude: 0.875 V
Lead Channel Pacing Threshold Pulse Width: 0.4 ms
Lead Channel Pacing Threshold Pulse Width: 0.4 ms
Lead Channel Sensing Intrinsic Amplitude: 4 mV
Lead Channel Sensing Intrinsic Amplitude: 4 mV
Lead Channel Sensing Intrinsic Amplitude: 4.625 mV
Lead Channel Sensing Intrinsic Amplitude: 4.625 mV
Lead Channel Setting Pacing Amplitude: 2 V
Lead Channel Setting Pacing Amplitude: 2.5 V
Lead Channel Setting Pacing Pulse Width: 0.4 ms
Lead Channel Setting Sensing Sensitivity: 1.2 mV
Zone Setting Status: 755011
Zone Setting Status: 755011

## 2023-03-23 DIAGNOSIS — I4510 Unspecified right bundle-branch block: Secondary | ICD-10-CM | POA: Diagnosis not present

## 2023-03-23 DIAGNOSIS — I2510 Atherosclerotic heart disease of native coronary artery without angina pectoris: Secondary | ICD-10-CM | POA: Diagnosis not present

## 2023-03-23 DIAGNOSIS — I480 Paroxysmal atrial fibrillation: Secondary | ICD-10-CM | POA: Diagnosis not present

## 2023-03-23 DIAGNOSIS — R918 Other nonspecific abnormal finding of lung field: Secondary | ICD-10-CM | POA: Diagnosis not present

## 2023-03-23 DIAGNOSIS — E785 Hyperlipidemia, unspecified: Secondary | ICD-10-CM | POA: Diagnosis not present

## 2023-03-23 DIAGNOSIS — I700 Atherosclerosis of aorta: Secondary | ICD-10-CM | POA: Diagnosis not present

## 2023-03-23 DIAGNOSIS — R972 Elevated prostate specific antigen [PSA]: Secondary | ICD-10-CM | POA: Diagnosis not present

## 2023-03-23 DIAGNOSIS — Z950 Presence of cardiac pacemaker: Secondary | ICD-10-CM | POA: Diagnosis not present

## 2023-03-23 DIAGNOSIS — R0683 Snoring: Secondary | ICD-10-CM | POA: Diagnosis not present

## 2023-03-23 DIAGNOSIS — I10 Essential (primary) hypertension: Secondary | ICD-10-CM | POA: Diagnosis not present

## 2023-03-23 DIAGNOSIS — M1990 Unspecified osteoarthritis, unspecified site: Secondary | ICD-10-CM | POA: Diagnosis not present

## 2023-03-23 DIAGNOSIS — F0390 Unspecified dementia without behavioral disturbance: Secondary | ICD-10-CM | POA: Diagnosis not present

## 2023-04-07 NOTE — Telephone Encounter (Addendum)
Spoke  to pt  made appointment for 04/08/2023  at 915 with Shanda Bumps ,NP to do [facility]  test for laquembi Pt expressed understanding and thanked me for calling

## 2023-04-07 NOTE — Progress Notes (Unsigned)
[facility]  PATIENT: Alexander [name] DOB: [birth year]  REFERRING CLINICIAN: Rodrigo Ran, MD HISTORY FROM: patient and wife REASON FOR VISIT: FOLLOW UP   HISTORICAL  CHIEF COMPLAINT:  No chief complaint on file.   HISTORY OF PRESENT ILLNESS:   Update 04/08/2023 JM: Patient returns for follow-up visit to repeat memory testing.  Patient came to office shortly after prior visit requesting to start donanemab monthly infusions. Prior MMSE 27/30.      UPDATE (02/16/2023, VRP): Since last visit, doing about the same.  Continues with some mild forgetfulness issues.  Wife notes more problems.  He is able to maintain most of his ADLs.  However she cannot rely on him to follow-up on to do list tasks like previously.  Testing has been completed.  Here to discuss amyloid targeting antibiotic therapy.  PRIOR HPI: 83 year old male, retired Investment banker, operational, here for evaluation of memory loss.  Past 6 to 12 months patient has had onset of mild short-term memory loss, having difficulty remembering names, appointments and conversations.   Had an incident for his recent primary care office visit where he showed up at the wrong time.  This is unusual for him.  Otherwise he is able to maintain most of his own ADLs.  He is still driving, playing golf on a regular basis, able to be on his own.  However wife is noted that he is starting to become a little bit less reliable when she asks him to do tasks.  They are trying to use reminders, smart phone alarms and other compensation techniques.  At PCP visit on 12/01/2022, concern for possible mild dementia was raised due to family history in his mother.  He was started on galantamine and had phosphorylated tau testing done but not resulted yet.    REVIEW OF SYSTEMS: Full 14 system review of systems performed and negative with exception of: as per HPI.  ALLERGIES: No Known Allergies  HOME MEDICATIONS: Outpatient Medications Prior  to Visit  Medication Sig Dispense Refill   COVID-19 mRNA vaccine, Pfizer, (COMIRNATY) syringe Inject into the muscle. 0.3 mL 0   ezetimibe (ZETIA) 10 MG tablet Take 10 mg by mouth daily.     galantamine (RAZADYNE ER) 8 MG 24 hr capsule Take 8 mg by mouth every morning.     inclisiran (LEQVIO) 284 MG/1.5ML SOSY injection Inject 1.37ml every 6 months 1.5 mL 0   metoprolol succinate (TOPROL-XL) 50 MG 24 hr tablet TAKE ONE TABLET AT BEDTIME WITH OR IMMEDIATELY FOLLOWING A MEAL 90 tablet 3   pneumococcal 20-valent conjugate vaccine (PREVNAR 20) 0.5 ML injection Inject 0.5 mLs into the muscle. 0.5 mL 0   zolpidem (AMBIEN) 10 MG tablet Take 5-10 mg by mouth as needed.     No facility-administered medications prior to visit.    PAST MEDICAL HISTORY: Past Medical History:  Diagnosis Date   Heart murmur    Hyperlipidemia    Hypertension    Mitral regurgitation    MVP (mitral valve prolapse)    OSA (obstructive sleep apnea)    AHI of 6.78/hr and O2 sats as low as 84%.  His OSA was moderate during REM sleep at 15.4/hr and 10.6/hr during supine sleep   Presence of permanent cardiac pacemaker    Snoring 09/07/2019   Syncope     PAST SURGICAL HISTORY: Past Surgical History:  Procedure Laterality Date   BUBBLE STUDY  05/23/2019   Procedure: BUBBLE STUDY;  Surgeon: Lars Masson, MD;  Location: [facility]  ENDOSCOPY;  Service: Cardiovascular;;   HERNIA REPAIR     MITRAL VALVE REPAIR Right 06/14/2019   Procedure: MINIMALLY INVASIVE MITRAL VALVE REPAIR (MVR)<TEE;  Surgeon: Purcell Nails, MD;  Location: [facility] OR;  Service: Open Heart Surgery;  Laterality: Right;   PACEMAKER IMPLANT N/A 10/12/2017   Procedure: PACEMAKER IMPLANT;  Surgeon: Regan Lemming, MD;  Location: [facility] INVASIVE CV LAB;  Service: Cardiovascular;  Laterality: N/A;   RIGHT/LEFT HEART CATH AND CORONARY ANGIOGRAPHY N/A 06/06/2019   Procedure: RIGHT/LEFT HEART CATH AND CORONARY ANGIOGRAPHY;  Surgeon: Kathleene Hazel, MD;  Location:  [facility] INVASIVE CV LAB;  Service: Cardiovascular;  Laterality: N/A;   TEE WITHOUT CARDIOVERSION N/A 05/23/2019   Procedure: TRANSESOPHAGEAL ECHOCARDIOGRAM (TEE);  Surgeon: Lars Masson, MD;  Location: [facility] ENDOSCOPY;  Service: Cardiovascular;  Laterality: N/A;   TEE WITHOUT CARDIOVERSION N/A 06/14/2019   Procedure: TRANSESOPHAGEAL ECHOCARDIOGRAM (TEE);  Surgeon: Purcell Nails, MD;  Location: [facility] OR;  Service: Open Heart Surgery;  Laterality: N/A;   TONSILECTOMY, ADENOIDECTOMY, BILATERAL MYRINGOTOMY AND TUBES     as child   TONSILLECTOMY     TOTAL KNEE ARTHROPLASTY Left    TOTAL KNEE ARTHROPLASTY Left 02/23/2019   Procedure: TOTAL KNEE ARTHROPLASTY;  Surgeon: Ollen Gross, MD;  Location: [facility] ORS;  Service: Orthopedics;  Laterality: Left;     FAMILY HISTORY: Family History  Problem Relation Age of Onset   Alzheimer's disease Mother    Hypertension Father     SOCIAL HISTORY: Social History   Socioeconomic History   Marital status: Married    Spouse name: Not on file   Number of children: Not on file   Years of education: Not on file   Highest education level: Not on file  Occupational History   Not on file  Tobacco Use   Smoking status: Never   Smokeless tobacco: Never  Vaping Use   Vaping status: Never Used  Substance and Sexual Activity   Alcohol use: Yes    Alcohol/week: 5.0 standard drinks of alcohol    Types: 5 Glasses of wine per week   Drug use: No   Sexual activity: Not on file  Other Topics Concern   Not on file  Social History Narrative   Pt lives with wife    Retired    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 : No  Physical Activity: Not on file  Stress: Not on file  Social Connections: Not on file  Intimate Partner Violence: Not on file     PHYSICAL EXAM  GENERAL EXAM/CONSTITUTIONAL: Vitals:  There were no vitals filed for this visit.  There is no height or weight on file to calculate BMI. Wt Readings from Last 3 Encounters:  02/16/23 175 lb (79.4 kg)  02/16/23 175 lb 6.4 oz (79.6 kg)  01/13/23 173 lb 12.8 oz (78.8 kg)   Patient is in no distress; well developed, nourished and groomed; neck is supple  CARDIOVASCULAR: Examination of carotid arteries is normal; no carotid bruits Regular rate and rhythm, no murmurs Examination of peripheral vascular system by observation and palpation is normal  EYES: Ophthalmoscopic exam of optic discs and posterior segments is normal; no papilledema or hemorrhages No results found.  MUSCULOSKELETAL: Gait, strength, tone, movements noted in Neurologic exam below  NEUROLOGIC: MENTAL STATUS:     12/03/2022    4:24 PM  MMSE - Mini Mental State Exam  Orientation to time 5  Orientation to Place 5  Registration 3  Attention/ Calculation 5  Recall 1  Language- name 2 objects 2  Language- repeat 1  Language- follow 3 step command 3  Language- read & follow direction 0  Write a sentence 1  Copy design 1  Total score 27   awake, alert, oriented to person, place and time recent and remote memory intact normal attention and concentration language fluent, comprehension intact, naming intact fund of knowledge appropriate  CRANIAL NERVE:  2nd - no papilledema on fundoscopic exam 2nd, 3rd, 4th, 6th - pupils equal and reactive to light, visual fields full to confrontation, extraocular muscles intact, no nystagmus 5th - facial sensation symmetric 7th - facial strength symmetric 8th - hearing  intact 9th - palate elevates symmetrically, uvula midline 11th - shoulder shrug symmetric 12th - tongue protrusion midline  MOTOR:  normal bulk and tone, full strength in the BUE, BLE  SENSORY:  normal and symmetric to light touch, temperature, vibration  COORDINATION:  finger-nose-finger, fine finger movements normal  REFLEXES:  deep tendon reflexes present and symmetric  GAIT/STATION:  narrow based gait     DIAGNOSTIC DATA (LABS, IMAGING, TESTING) - I reviewed patient records, labs, notes, testing and imaging myself where available.  Lab Results  Component Value Date   WBC 6.9 06/19/2019   HGB 10.9 (L) 06/19/2019   HCT 32.2 (L) 06/19/2019   MCV 93.3 06/19/2019   PLT 154 06/19/2019      Component Value Date/Time   NA 140 06/19/2019 0332   NA 141 05/19/2019 1354   K 3.8 06/19/2019 0332   CL 102 06/19/2019 0332   CO2 29 06/19/2019 0332   GLUCOSE 107 (H) 06/19/2019 0332   BUN 11 06/19/2019 0332   BUN 20 05/19/2019 1354   CREATININE 0.77 06/19/2019 0332   CALCIUM 8.3 (L) 06/19/2019 0332   PROT 6.3 (L) 06/10/2019 1225   PROT 6.2 02/10/2019 1429   ALBUMIN 3.9 06/10/2019 1225   ALBUMIN 4.5 02/10/2019 1429   AST 26 06/10/2019 1225   ALT 18 06/10/2019 1225   ALKPHOS 67 06/10/2019 1225   BILITOT 0.4 06/10/2019 1225   BILITOT 0.5 02/10/2019 1429   GFRNONAA >60 06/19/2019 0332   GFRAA >60 06/19/2019 0332   Lab Results  Component Value Date   CHOL 169 02/10/2019   HDL 81 02/10/2019   LDLCALC 73 02/10/2019   TRIG 80 02/10/2019   CHOLHDL 2.1 02/10/2019   Lab Results  Component Value Date   HGBA1C 5.3 06/10/2019   Lab Results  Component Value Date   VITAMINB12 523 02/09/2023   Lab Results  Component Value Date   TSH 1.730 02/09/2023   02/09/23     Component Ref Range & Units 7 d ago  A -- Beta-amyloid 42/40 Ratio >0.102 0.106  Beta-amyloid 42 pg/mL 19.30  Beta-amyloid 40 pg/mL 181.72  T -- p-tau181 0.00 - 0.97 pg/mL 1.01 High   N -- NfL,  Plasma 0.00 - 11.55 pg/mL 2.76  ATN SUMMARY Comment  Comment:                        A- T+ N- A high pTau181 concentration was observed. A normal beta-amyloid 42/40 ratio and normal concentration of NfL were observed at this time. These results are not consistent with the presence of Alzheimer's- related pathology, but may indicate pathology of another condition. Additional assessments may be necessary. These tests are intended to be used only in the context of clinical care.   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  12/16/22 MRI brain [I reviewed images myself and agree with interpretation. Total of 3 chronic cerebral microhemorrhages. -VRP]  - 2 mm focus of susceptibility-weighted signal loss within the anterior right frontal lobe, likely reflecting a chronic microhemorrhage. Additional punctate chronic microhemorrhages along the medial aspect of the left caudate nucleus and within the right cerebellar hemisphere. 1. 4 mm lesion within the posterior aspect of the pituitary gland, as described. This may reflect a cyst or pituitary microadenoma. No further imaging evaluation or imaging follow-up is necessary. Correlate with history of pituitary hypersecretion. This follows ACR consensus guidelines: Management of Incidental Pituitary Findings on CT, MRI and F18-FDG PET: A White Paper of the ACR Incidental Findings Committee. J Am Coll Radiol 2018; 15: 966-72. 2. Mild chronic small ischemic changes within the cerebral white matter. 3. Mild generalized cerebral atrophy.    ASSESSMENT AND PLAN  83 y.o. year old male here with mild progressive short-term memory loss, recall difficulty, in last 6 to 12 months, with MMSE 27 out of 30.  However cognitive changes have  somewhat rapidly changed according to wife and other family members, especially compared to his prior high functioning level.  Dx:  No diagnosis found.   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  This included previsit chart review, lab review, study review, order entry, electronic health record documentation, patient education and discussion regarding above diagnoses and treatment plan and answered all other questions to patient's satisfaction  Ihor Austin, [facility]  [facility] [address] [place], [place] [address]  Phone [phone] Fax [phone] Note: This document was prepared with digital dictation and possible smart phrase technology. Any transcriptional errors that result from this process are unintentional.

## 2023-04-08 ENCOUNTER — Encounter: Payer: Self-pay | Admitting: Adult Health

## 2023-04-08 ENCOUNTER — Telehealth: Payer: Self-pay | Admitting: Neurology

## 2023-04-08 ENCOUNTER — Ambulatory Visit (INDEPENDENT_AMBULATORY_CARE_PROVIDER_SITE_OTHER): Payer: Medicare Other | Admitting: Adult Health

## 2023-04-08 VITALS — BP 124/78 | HR 61 | Ht 70.0 in | Wt 176.0 lb

## 2023-04-08 DIAGNOSIS — G3184 Mild cognitive impairment, so stated: Secondary | ICD-10-CM | POA: Diagnosis not present

## 2023-04-08 DIAGNOSIS — G309 Alzheimer's disease, unspecified: Secondary | ICD-10-CM | POA: Diagnosis not present

## 2023-04-08 NOTE — Patient Instructions (Addendum)
Your Plan:  Will plan on pursuing donanemab but this will require insurance approval, our infusion suite will keep you updated  We will need to get routine MRI imaging after your second, third, fourth and seventh infusions     Follow-up in 6 months or call earlier if needed     Thank you for coming to see [place] at [facility]. I hope we have been able to provide you high quality care today.  You may receive a patient satisfaction survey over the next few weeks. We would appreciate your feedback and comments so that we may continue to improve ourselves and the health of our patients.

## 2023-04-08 NOTE — Telephone Encounter (Signed)
Provided the updated notes and memory score to the infusion suite for them to restart the process for the patient.

## 2023-04-13 ENCOUNTER — Other Ambulatory Visit: Payer: Self-pay | Admitting: Neurology

## 2023-04-13 DIAGNOSIS — G3184 Mild cognitive impairment, so stated: Secondary | ICD-10-CM

## 2023-04-13 DIAGNOSIS — R413 Other amnesia: Secondary | ICD-10-CM

## 2023-04-14 ENCOUNTER — Telehealth: Payer: Self-pay | Admitting: Diagnostic Neuroimaging

## 2023-04-14 NOTE — Telephone Encounter (Signed)
No auth required, sent the order to [facility] to schedule, due to pacemaker he can't go to GI. [phone]

## 2023-04-20 DIAGNOSIS — H6121 Impacted cerumen, right ear: Secondary | ICD-10-CM | POA: Diagnosis not present

## 2023-04-20 DIAGNOSIS — H903 Sensorineural hearing loss, bilateral: Secondary | ICD-10-CM | POA: Diagnosis not present

## 2023-04-21 ENCOUNTER — Telehealth: Payer: Self-pay | Admitting: Diagnostic Neuroimaging

## 2023-04-21 DIAGNOSIS — Z006 Encounter for examination for normal comparison and control in clinical research program: Secondary | ICD-10-CM | POA: Diagnosis not present

## 2023-04-21 DIAGNOSIS — G3184 Mild cognitive impairment, so stated: Secondary | ICD-10-CM | POA: Diagnosis not present

## 2023-04-21 DIAGNOSIS — R413 Other amnesia: Secondary | ICD-10-CM

## 2023-04-21 DIAGNOSIS — G309 Alzheimer's disease, unspecified: Secondary | ICD-10-CM

## 2023-04-21 NOTE — Telephone Encounter (Signed)
Pt wife would like to know if his schedule for infusion is a rigid every 4 weeks? Has a trip planned out of country that falls on an infusion week, will gladly cancel, would just like to know how to proceed.

## 2023-04-21 NOTE — Telephone Encounter (Signed)
MRI order sent to [facility] to schedule. [phone]

## 2023-04-21 NOTE — Telephone Encounter (Signed)
Per infusion suite they try to stay as close to every 4 weeks as they can depending on scheduling allowing. Pt is scheduled 3/12 for the next infusion.  MRI's take place before the 2nd, 3rd 4th and 7th infusion.  They schedule the next infusion when they are present for their current infusion and as long as MRI is completed and read prior to the next scheduled infusion.

## 2023-04-21 NOTE — Addendum Note (Signed)
Addended by: Judi Cong on: 04/21/2023 12:24 PM   Modules accepted: Orders

## 2023-04-24 ENCOUNTER — Telehealth: Payer: Self-pay

## 2023-04-24 DIAGNOSIS — G4733 Obstructive sleep apnea (adult) (pediatric): Secondary | ICD-10-CM

## 2023-04-24 NOTE — Telephone Encounter (Signed)
**Note De-Identified Resean Brander Obfuscation** Ordering provider: Dr Duke Salvia Associated diagnoses: OSA-G47.33, Snoring-R06.33, and Somnolence-R40.0 WatchPAT PA obtained on 04/24/2023 by Emerson Barretto, Lorelle Formosa, LPN. Authorization: Per the Medicare Part A and B webisite: Medicare does not require prior authorization for CPT Code [address]. Patient notified of PIN (1234) on 04/24/2023 [place]ad [place] Notification Method: phone (I s/w/advised the pts wife and DPR, Bonita Quin).  Phone note routed to covering staff for follow-up.

## 2023-04-28 NOTE — Addendum Note (Signed)
Addended by: Geralyn Flash D on: 04/28/2023 03:08 PM   Modules accepted: Orders

## 2023-04-28 NOTE — Progress Notes (Signed)
 Remote pacemaker transmission.

## 2023-05-07 ENCOUNTER — Ambulatory Visit (HOSPITAL_COMMUNITY)
Admission: RE | Admit: 2023-05-07 | Discharge: 2023-05-07 | Disposition: A | Discharge status: Home / Self Care | Payer: Medicare Other | Source: Ambulatory Visit | Attending: Diagnostic Neuroimaging | Admitting: Diagnostic Neuroimaging

## 2023-05-07 DIAGNOSIS — G309 Alzheimer's disease, unspecified: Secondary | ICD-10-CM | POA: Insufficient documentation

## 2023-05-07 DIAGNOSIS — J329 Chronic sinusitis, unspecified: Secondary | ICD-10-CM | POA: Diagnosis not present

## 2023-05-07 DIAGNOSIS — R413 Other amnesia: Secondary | ICD-10-CM | POA: Diagnosis not present

## 2023-05-07 DIAGNOSIS — G3184 Mild cognitive impairment, so stated: Secondary | ICD-10-CM

## 2023-05-08 ENCOUNTER — Encounter (HOSPITAL_COMMUNITY): Payer: Self-pay | Admitting: Orthopedic Surgery

## 2023-05-11 ENCOUNTER — Encounter (HOSPITAL_COMMUNITY): Payer: Medicare Other

## 2023-05-14 ENCOUNTER — Other Ambulatory Visit (HOSPITAL_COMMUNITY): Payer: Self-pay | Admitting: *Deleted

## 2023-05-15 ENCOUNTER — Encounter (HOSPITAL_COMMUNITY)
Admission: RE | Admit: 2023-05-15 | Discharge: 2023-05-15 | Disposition: A | Discharge status: Home / Self Care | Payer: Medicare Other | Source: Ambulatory Visit | Attending: Internal Medicine | Admitting: Internal Medicine

## 2023-05-15 DIAGNOSIS — E785 Hyperlipidemia, unspecified: Secondary | ICD-10-CM | POA: Insufficient documentation

## 2023-05-15 DIAGNOSIS — I2510 Atherosclerotic heart disease of native coronary artery without angina pectoris: Secondary | ICD-10-CM | POA: Diagnosis not present

## 2023-05-15 MED ORDER — INCLISIRAN SODIUM 284 MG/1.5ML ~~LOC~~ SOSY
PREFILLED_SYRINGE | SUBCUTANEOUS | Status: AC
  Filled 2023-05-15: qty 1.5

## 2023-05-15 MED ORDER — INCLISIRAN SODIUM 284 MG/1.5ML ~~LOC~~ SOSY
284.0000 mg | PREFILLED_SYRINGE | Freq: Once | SUBCUTANEOUS | Status: AC
  Administered 2023-05-15: 284 mg via SUBCUTANEOUS

## 2023-05-18 NOTE — Telephone Encounter (Signed)
 Unreturned charge posted to patient's account.

## 2023-05-18 NOTE — Telephone Encounter (Signed)
 Itamar not completed- message sent 3/1- routing to billing at this time

## 2023-05-20 ENCOUNTER — Other Ambulatory Visit: Payer: Self-pay | Admitting: Neurology

## 2023-05-20 DIAGNOSIS — G3184 Mild cognitive impairment, so stated: Secondary | ICD-10-CM | POA: Diagnosis not present

## 2023-05-20 DIAGNOSIS — Z006 Encounter for examination for normal comparison and control in clinical research program: Secondary | ICD-10-CM | POA: Diagnosis not present

## 2023-05-20 DIAGNOSIS — R413 Other amnesia: Secondary | ICD-10-CM

## 2023-05-20 NOTE — Progress Notes (Signed)
 Results are good, no major findings.  3 stable chronic cerebral microhemorrhages, similar to prior study from October 2024.  Continue current treatment plan with donanemab. -VRP

## 2023-05-27 ENCOUNTER — Ambulatory Visit (HOSPITAL_COMMUNITY): Payer: Medicare Other

## 2023-06-01 NOTE — Telephone Encounter (Signed)
 I asked the MRI schedulers at [facility] to move up his MRI so that is done before April 8 for his infusion.

## 2023-06-04 DIAGNOSIS — G3184 Mild cognitive impairment, so stated: Secondary | ICD-10-CM | POA: Diagnosis not present

## 2023-06-04 DIAGNOSIS — G309 Alzheimer's disease, unspecified: Secondary | ICD-10-CM | POA: Diagnosis not present

## 2023-06-09 ENCOUNTER — Telehealth: Payer: Self-pay | Admitting: Cardiology

## 2023-06-09 ENCOUNTER — Ambulatory Visit (HOSPITAL_COMMUNITY)
Admission: RE | Admit: 2023-06-09 | Discharge: 2023-06-09 | Disposition: A | Discharge status: Home / Self Care | Source: Ambulatory Visit | Attending: Diagnostic Neuroimaging | Admitting: Diagnostic Neuroimaging

## 2023-06-09 DIAGNOSIS — G3184 Mild cognitive impairment, so stated: Secondary | ICD-10-CM

## 2023-06-09 DIAGNOSIS — G309 Alzheimer's disease, unspecified: Secondary | ICD-10-CM | POA: Diagnosis not present

## 2023-06-09 DIAGNOSIS — R413 Other amnesia: Secondary | ICD-10-CM

## 2023-06-09 DIAGNOSIS — G319 Degenerative disease of nervous system, unspecified: Secondary | ICD-10-CM | POA: Diagnosis not present

## 2023-06-09 DIAGNOSIS — G936 Cerebral edema: Secondary | ICD-10-CM | POA: Diagnosis not present

## 2023-06-09 NOTE — Telephone Encounter (Signed)
 Pt came in asking that we call wife to give her outstanding balance bill amount. Called wife, wife is wanting to know why she owes money, gave her billing number to better assist her.

## 2023-06-11 ENCOUNTER — Telehealth: Payer: Self-pay | Admitting: Adult Health

## 2023-06-11 DIAGNOSIS — G309 Alzheimer's disease, unspecified: Secondary | ICD-10-CM

## 2023-06-11 NOTE — Telephone Encounter (Signed)
 Pt completed MRI Brain 06/09/23. Results still pending.

## 2023-06-11 NOTE — Telephone Encounter (Signed)
 I called pt. Relayed results still pending but as soon as they come back, we will call him. He verbalized understanding.

## 2023-06-11 NOTE — Telephone Encounter (Signed)
 Pt has concerns re: MRI, he'd like a call with results from Dr Marjory Lies as soon as he is able.

## 2023-06-12 ENCOUNTER — Telehealth: Payer: Self-pay | Admitting: Cardiology

## 2023-06-12 ENCOUNTER — Other Ambulatory Visit (HOSPITAL_COMMUNITY): Payer: Medicare Other

## 2023-06-12 DIAGNOSIS — I10 Essential (primary) hypertension: Secondary | ICD-10-CM

## 2023-06-12 DIAGNOSIS — R0683 Snoring: Secondary | ICD-10-CM

## 2023-06-12 DIAGNOSIS — G4733 Obstructive sleep apnea (adult) (pediatric): Secondary | ICD-10-CM

## 2023-06-12 DIAGNOSIS — R400 Somnolence: Secondary | ICD-10-CM

## 2023-06-12 DIAGNOSIS — I480 Paroxysmal atrial fibrillation: Secondary | ICD-10-CM

## 2023-06-12 NOTE — Telephone Encounter (Signed)
**Note De-Identified Siomara Burkel Obfuscation** Ordering provider: Dr Duke Salvia Associated diagnoses: Snoring-R06.83 and Somnolence-R40.0  WatchPAT PA obtained on 06/12/2023 by Dorthy Hustead, Lorelle Formosa, LPN. Authorization: I called the pts wife and DPR, LInda back but got no answer so I called the pts cell phone and was able to s/w him. I gave him the [facility] One-HST Device Pin # of "1234" and advised him that he can proceed with his home sleep test as a PA was not required for this test per Medicare. He verbalized understanding and stated that he plans to do his home sleep test next week.  Patient notified of PIN (1234) on 06/12/2023 Frederich Montilla Notification Method: phone.  Phone note routed to covering staff for follow-up.

## 2023-06-12 NOTE — Telephone Encounter (Signed)
 Wife Bonita Quin) wants a call back to confirm the activation code/password for patient to do his sleep study tonight.

## 2023-06-16 ENCOUNTER — Ambulatory Visit: Payer: Medicare Other

## 2023-06-16 NOTE — Telephone Encounter (Signed)
 Spoke with wife regarding sleep study  His phone is having issues and unable to download APP  Will change the Itamar to in lab sleep sutdy

## 2023-06-16 NOTE — Telephone Encounter (Signed)
 Pt has called to inquire about MRI results before his infusion, he was reminded of response from [place], [place]

## 2023-06-16 NOTE — Telephone Encounter (Signed)
 Wife is calling back about the patient sleep study. Please advise

## 2023-06-17 LAB — CUP PACEART REMOTE DEVICE CHECK
Battery Remaining Longevity: 103 mo
Battery Voltage: 2.98 V
Brady Statistic AP VP Percent: 0.03 %
Brady Statistic AP VS Percent: 87.24 %
Brady Statistic AS VP Percent: 0.01 %
Brady Statistic AS VS Percent: 12.72 %
Brady Statistic RA Percent Paced: 87.34 %
Brady Statistic RV Percent Paced: 0.04 %
Date Time Interrogation Session: 20250406212106
Implantable Lead Connection Status: 753985
Implantable Lead Connection Status: 753985
Implantable Lead Implant Date: 20190805
Implantable Lead Implant Date: 20190805
Implantable Lead Location: 753859
Implantable Lead Location: 753860
Implantable Lead Model: 5076
Implantable Lead Model: 5076
Implantable Pulse Generator Implant Date: 20190805
Lead Channel Impedance Value: 304 Ohm
Lead Channel Impedance Value: 323 Ohm
Lead Channel Impedance Value: 418 Ohm
Lead Channel Impedance Value: 437 Ohm
Lead Channel Pacing Threshold Amplitude: 0.75 V
Lead Channel Pacing Threshold Amplitude: 0.75 V
Lead Channel Pacing Threshold Pulse Width: 0.4 ms
Lead Channel Pacing Threshold Pulse Width: 0.4 ms
Lead Channel Sensing Intrinsic Amplitude: 3.75 mV
Lead Channel Sensing Intrinsic Amplitude: 3.75 mV
Lead Channel Sensing Intrinsic Amplitude: 4.125 mV
Lead Channel Sensing Intrinsic Amplitude: 4.125 mV
Lead Channel Setting Pacing Amplitude: 2 V
Lead Channel Setting Pacing Amplitude: 2.5 V
Lead Channel Setting Pacing Pulse Width: 0.4 ms
Lead Channel Setting Sensing Sensitivity: 1.2 mV
Zone Setting Status: 755011
Zone Setting Status: 755011

## 2023-06-17 NOTE — Telephone Encounter (Signed)
 Results are posted and Dr Marjory Lies has been made aware. Per Dr Marjory Lies, pt should hold off on infusion at this time and he will call and speak with wife and patient. Intrafusion made aware of this information as well.

## 2023-06-17 NOTE — Telephone Encounter (Signed)
 Pt actually was scheduled for kisunla infusion today and the MRI report was still not read by the radiologist therefore the infusion will be postponed until the patient can have the report

## 2023-06-17 NOTE — Telephone Encounter (Signed)
 I called patient to review results. I also called wife to review results.  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  Therefore recommend to suspend Kisunla treatment and repeat monthly MRI brain scans. If ARIA-e resolves and ARIA-h stabilizes, and clinical symptoms resolved, then may consider to cautiously resume Kisunla in future.   Suanne Marker, MD 06/17/2023, 2:59 PM Certified in Neurology, Neurophysiology and Neuroimaging  [facility] [address] [place], [place] [address] [phone]

## 2023-06-17 NOTE — Addendum Note (Signed)
 Addended by: Joycelyn Schmid R on: 06/17/2023 02:59 PM   Modules accepted: Orders

## 2023-06-17 NOTE — Telephone Encounter (Signed)
 Pt said can call wife's phone now, she has her phone.

## 2023-06-23 NOTE — Telephone Encounter (Signed)
 Pt's wife has called to report pt has his infusion every 4 weeks, he could not have the last scheduled one due to recent MRI showing a small brain bleed. MRI is scheduled for 4-29 at [facility], she is asking for a call to discuss if pt will be allowed to keep upcoming infusion appointment which is scheduled for 5-07, please call.

## 2023-06-23 NOTE — Telephone Encounter (Signed)
 Spoke w/Pt spouse regarding infusion continuing. Informed spouse that Dr. Salli Crawley will have to review the MRI before the infusions can continue. Assured spouse that intrafusion is aware the infusion will be on hold until the MRI is read and that if Dr. Salli Crawley thinks it is ok for the infusions to continue intrafusion will be able to get Pt rescheduled. Informed spouse that as soon as Dr. Salli Crawley reviews the MRI results we will reach out to them. Spouse stated understanding.  Spouse stated she is very thankful for Dr. Salli Crawley, Camilo Cella, and Sena and the staff because everyone has been so caring and attentive to the Pt and his issues and she is extremely grateful. Thanked spouse for her kind words.

## 2023-06-30 ENCOUNTER — Ambulatory Visit (HOSPITAL_COMMUNITY)

## 2023-07-01 NOTE — CV Procedure (Signed)
  Device system confirmed to be MRI conditional, with implant date > 6 weeks ago, and no evidence of abandoned or epicardial leads in review of most recent CXR  Device last cleared by EP Provider: Valeri Gate 07/01/23  Clearance is good through for 1 year as long as parameters remain stable at time of check. If pt undergoes a cardiac device procedure during that time, they should be re-cleared.   Tachy-therapies to be programmed off if applicable with device back to pre-MRI settings after completion of exam.  [employer] - Programming recommendation received through [employer] App/Tablet  Arlys Lamer, RT  07/01/2023 2:05 PM

## 2023-07-02 ENCOUNTER — Telehealth (HOSPITAL_BASED_OUTPATIENT_CLINIC_OR_DEPARTMENT_OTHER): Payer: Self-pay | Admitting: *Deleted

## 2023-07-02 NOTE — Telephone Encounter (Addendum)
 Wife called to follow up on in lab sleep study Will forward to sleep pool to follow up

## 2023-07-07 ENCOUNTER — Ambulatory Visit (HOSPITAL_COMMUNITY)
Admission: RE | Admit: 2023-07-07 | Discharge: 2023-07-07 | Disposition: A | Discharge status: Home / Self Care | Source: Ambulatory Visit | Attending: Diagnostic Neuroimaging | Admitting: Diagnostic Neuroimaging

## 2023-07-07 ENCOUNTER — Telehealth: Payer: Self-pay | Admitting: Neurology

## 2023-07-07 DIAGNOSIS — G309 Alzheimer's disease, unspecified: Secondary | ICD-10-CM | POA: Diagnosis not present

## 2023-07-07 DIAGNOSIS — G319 Degenerative disease of nervous system, unspecified: Secondary | ICD-10-CM | POA: Diagnosis not present

## 2023-07-07 DIAGNOSIS — G3184 Mild cognitive impairment, so stated: Secondary | ICD-10-CM | POA: Diagnosis not present

## 2023-07-07 DIAGNOSIS — R413 Other amnesia: Secondary | ICD-10-CM

## 2023-07-07 NOTE — Telephone Encounter (Signed)
 Pt came in today at the front desk wanting to know about the MRI results and his upcoming infusion. Advised Dr Salli Crawley has not yet reviewed the information but I would make him aware and he would advise of the results. Pt requested Dr Salli Crawley to call him or his wife. I advised that either myself or possibly Dr Salli Crawley will be in touch once he has a chance to review the information.    "IMPRESSION: 1. Interval resolution of left frontal ARIA-E. 2. Unchanged ARIA-H. 3. No acute or progressive finding."

## 2023-07-08 NOTE — Telephone Encounter (Signed)
 ARIA-H finding is unchanged. ARIA-E is improved. Continue to hold Kisunla and repeat MRI in 1 month. -VRP

## 2023-07-08 NOTE — Telephone Encounter (Signed)
 Spoke with Jerrell Mora LPN regar//ding patient and his sleep study Patient had normal in lab sleep study 10/2022 Discussed with Dr Theodis Fiscal and ok to cancel orders    Advised wife who is very concerned about patients nighttime breathing  She questioned if could be coming from him memory issues  Advised wife to reach out to neurologist and discuss with them and if another sleep study would be necessary. If so they will need to order

## 2023-07-09 ENCOUNTER — Other Ambulatory Visit: Payer: Self-pay | Admitting: Neurology

## 2023-07-09 NOTE — Telephone Encounter (Signed)
 Called the pt and wife back and discussed the results. Advised MRI should be done with one month and read prior to scheduling next infusion. She is concerned about the imaging in [place] not getting him in in timely manner. Advised that I would add the instructions to the order for them to be made aware when scheduling.

## 2023-07-09 NOTE — Addendum Note (Signed)
 Addended by: Elton Ham on: 07/09/2023 09:08 AM   Modules accepted: Orders

## 2023-07-17 ENCOUNTER — Ambulatory Visit (HOSPITAL_BASED_OUTPATIENT_CLINIC_OR_DEPARTMENT_OTHER)

## 2023-07-17 DIAGNOSIS — I7121 Aneurysm of the ascending aorta, without rupture: Secondary | ICD-10-CM

## 2023-07-17 DIAGNOSIS — I10 Essential (primary) hypertension: Secondary | ICD-10-CM | POA: Diagnosis not present

## 2023-07-17 LAB — ECHOCARDIOGRAM COMPLETE
AR max vel: 3.33 cm2
AV Area VTI: 3.29 cm2
AV Area mean vel: 2.88 cm2
AV Mean grad: 2 mmHg
AV Peak grad: 3.2 mmHg
Ao pk vel: 0.89 m/s
Area-P 1/2: 3.53 cm2
MV VTI: 1.79 cm2
S' Lateral: 2.93 cm

## 2023-07-18 ENCOUNTER — Encounter (HOSPITAL_BASED_OUTPATIENT_CLINIC_OR_DEPARTMENT_OTHER): Payer: Self-pay | Admitting: Cardiovascular Disease

## 2023-07-20 ENCOUNTER — Other Ambulatory Visit (HOSPITAL_BASED_OUTPATIENT_CLINIC_OR_DEPARTMENT_OTHER): Payer: Medicare Other

## 2023-07-20 DIAGNOSIS — H6123 Impacted cerumen, bilateral: Secondary | ICD-10-CM | POA: Diagnosis not present

## 2023-07-24 ENCOUNTER — Ambulatory Visit (HOSPITAL_COMMUNITY)

## 2023-07-28 ENCOUNTER — Ambulatory Visit (HOSPITAL_COMMUNITY)
Admission: RE | Admit: 2023-07-28 | Discharge: 2023-07-28 | Disposition: A | Discharge status: Home / Self Care | Source: Ambulatory Visit | Attending: Diagnostic Neuroimaging | Admitting: Diagnostic Neuroimaging

## 2023-07-28 ENCOUNTER — Telehealth: Payer: Self-pay | Admitting: Neurology

## 2023-07-28 DIAGNOSIS — G309 Alzheimer's disease, unspecified: Secondary | ICD-10-CM | POA: Diagnosis not present

## 2023-07-28 DIAGNOSIS — I63432 Cerebral infarction due to embolism of left posterior cerebral artery: Secondary | ICD-10-CM

## 2023-07-28 DIAGNOSIS — G3184 Mild cognitive impairment, so stated: Secondary | ICD-10-CM | POA: Insufficient documentation

## 2023-07-28 DIAGNOSIS — G319 Degenerative disease of nervous system, unspecified: Secondary | ICD-10-CM | POA: Diagnosis not present

## 2023-07-28 DIAGNOSIS — I639 Cerebral infarction, unspecified: Secondary | ICD-10-CM | POA: Diagnosis not present

## 2023-07-28 DIAGNOSIS — R413 Other amnesia: Secondary | ICD-10-CM | POA: Diagnosis not present

## 2023-07-28 NOTE — Telephone Encounter (Signed)
 Received a call report on the patient's MRI. Will send the information to Dr Salli Crawley.  IMPRESSION: 1. 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 2. Otherwise stable non-contrast MRI appearance of the brain as compared to 07/07/2023. 3. ARIA-E: None (previous resolved). 4. Unchanged ARIA-H.

## 2023-07-28 NOTE — Telephone Encounter (Signed)
 I called patient (wife answered; patient playing golf). Patient has been feeling well. 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   Recommend: - start aspirin  81mg  daily - will ask cardiology to screen for atrial fibrillation (cardiac monitor) - check CTA head / neck  Orders Placed This Encounter  Procedures   CT ANGIO HEAD W OR WO CONTRAST   CT ANGIO NECK W OR WO CONTRAST    Omega Bible, MD 07/28/2023, 4:53 PM Certified in Neurology, Neurophysiology and Neuroimaging  [facility] [address], [address] [place], [place] [address] [phone]

## 2023-07-28 NOTE — CV Procedure (Signed)
  Device system confirmed to be MRI conditional, with implant date > 6 weeks ago, and no evidence of abandoned or epicardial leads in review of most recent CXR  Device last cleared by EP Provider: Valeri Gate, PA on 07/23/23  Clearance is good through for 1 year as long as parameters remain stable at time of check. If pt undergoes a cardiac device procedure during that time, they should be re-cleared.   Tachy-therapies to be programmed off if applicable with device back to pre-MRI settings after completion of exam.  [employer] - Programming recommendation received through [employer] App/Tablet  Jackelyn Marvel, RT  07/28/2023 4:58 PM

## 2023-07-29 ENCOUNTER — Other Ambulatory Visit (HOSPITAL_COMMUNITY): Payer: Medicare Other

## 2023-07-29 ENCOUNTER — Telehealth (HOSPITAL_BASED_OUTPATIENT_CLINIC_OR_DEPARTMENT_OTHER): Payer: Self-pay | Admitting: *Deleted

## 2023-07-29 DIAGNOSIS — I639 Cerebral infarction, unspecified: Secondary | ICD-10-CM

## 2023-07-29 NOTE — Telephone Encounter (Signed)
 Dr Theodis Fiscal reviewed device, secure chat below   Sorry I just looked at his device interrogation. External monitor not needed   Cancelled event monitor   Advised wife and patient   Have reached out to device clinic to see if they can call patient tomorrow to assist in getting updating transmission from 4/6 to today

## 2023-07-29 NOTE — Telephone Encounter (Signed)
 Pt requesting a c/b. Please advise

## 2023-07-29 NOTE — Telephone Encounter (Signed)
 Attempted to call the patient on the primary number, which is his wife. I advised Mrs. Erlandson that we are needing the patient to send us  a manuel transmission to re-evaluate his a-fib burden since 06/14/22 (ok per DPR to speak with her.)  She advised she is not with the patient right now and unsure if he knows how to send a manual transmission on his device.  The patient is currently on the golf course and she is at a book club.  Mrs. Darroch is requesting a call back in the AM at [phone].  I inquired if she will be with the patient in the morning and she thought she would be unless he goes to play golf.   I advised we will call her number in the AM to attempt a manual transmission with the patient.  She voices understanding and is agreeable.

## 2023-07-29 NOTE — Telephone Encounter (Signed)
 Contacted by Eldonna Greenspan, RN to assess if the patient's PPM could track his a-fib. 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  Since 03/16/23, he has had < 0.1% AT/AT burden:

## 2023-07-29 NOTE — Telephone Encounter (Signed)
 [name], MD  Omega Bible, MD; Lei Pump, MD; Zigmund Hills, LPN Eldonna Greenspan can we get a 30 day monitor on him to start with ?  Follow up after.  Thanks, Tiffany       Previous Messages  Attached Notes  Telephone Encounter by Omega Bible, MD at 07/28/2023  4:37 PM  Author: Omega Bible, MD Service: Neurology Author Type: Physician  Filed: 07/28/2023  5:01 PM Encounter Date: 07/28/2023 Note Type: Telephone Encounter  Status: Signed Editor: Omega Bible, MD (Physician)  I called patient (wife answered; patient playing golf). Patient has been feeling well. 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    Recommend: - start aspirin  81mg  daily - will ask cardiology to screen for atrial fibrillation (cardiac monitor) - check CTA head / neck      Orders Placed This Encounter  Procedures   CT ANGIO HEAD W OR WO CONTRAST   CT ANGIO NECK W OR WO CONTRAST      Omega Bible, MD 07/28/2023, 4:53 PM Certified in Neurology, Neurophysiology and Neuroimaging   [facility] [address] [place], [place] [address] [phone]      Order placed and will send message to scheduling to arrange placement

## 2023-07-30 NOTE — Telephone Encounter (Signed)
 Spoke with patients wife multiple times for an extended period. Transmission received. No AF per report. Presenting APVS. No concerns at this time. Sent to requesting party.

## 2023-07-30 NOTE — Telephone Encounter (Signed)
 Alexander Closs, RN to Me  [name], MD     07/30/23  1:47 PM Transmission received 5/22. No signs of AF.  Has been forwarded Dr Alexander [name] and advised wife

## 2023-08-05 ENCOUNTER — Telehealth: Payer: Self-pay | Admitting: Diagnostic Neuroimaging

## 2023-08-05 NOTE — Telephone Encounter (Signed)
 Pt's wife called wanting to know if the RN can ask the provider if it would be ok and beneficial for the pt to start taking Creatine to help with the Alzheimer's. If it is ok she would like to know how many mg and where she can get it.

## 2023-08-06 NOTE — Telephone Encounter (Signed)
 Spoke w/Pt wife regarding question of Creatine. Discussed that our providers have stated there is not enough evidence to show Creatine being effective for those w/ALZ and they do not recommend Patients taking. Pt wife voiced understanding and much gratitude for the call and for Dr. Salli Crawley. Wife asked for her gratitude to be passed on to Dr. Salli Crawley - assured Pt wife will pass on the information.

## 2023-08-12 ENCOUNTER — Ambulatory Visit: Payer: Self-pay | Admitting: Diagnostic Neuroimaging

## 2023-08-12 ENCOUNTER — Ambulatory Visit
Admission: RE | Admit: 2023-08-12 | Discharge: 2023-08-12 | Disposition: A | Discharge status: Home / Self Care | Source: Ambulatory Visit | Attending: Diagnostic Neuroimaging | Admitting: Diagnostic Neuroimaging

## 2023-08-12 DIAGNOSIS — I63432 Cerebral infarction due to embolism of left posterior cerebral artery: Secondary | ICD-10-CM

## 2023-08-12 DIAGNOSIS — I672 Cerebral atherosclerosis: Secondary | ICD-10-CM | POA: Diagnosis not present

## 2023-08-12 MED ORDER — IOPAMIDOL (ISOVUE-370) INJECTION 76%
75.0000 mL | Freq: Once | INTRAVENOUS | Status: AC | PRN
  Administered 2023-08-12: 75 mL via INTRAVENOUS

## 2023-09-07 ENCOUNTER — Telehealth (HOSPITAL_BASED_OUTPATIENT_CLINIC_OR_DEPARTMENT_OTHER): Payer: Self-pay | Admitting: Cardiovascular Disease

## 2023-09-07 ENCOUNTER — Telehealth: Payer: Self-pay | Admitting: Diagnostic Neuroimaging

## 2023-09-07 ENCOUNTER — Ambulatory Visit: Payer: Self-pay | Admitting: Surgery

## 2023-09-07 ENCOUNTER — Telehealth: Payer: Self-pay | Admitting: *Deleted

## 2023-09-07 ENCOUNTER — Telehealth: Payer: Self-pay | Admitting: Adult Health

## 2023-09-07 DIAGNOSIS — Z9889 Other specified postprocedural states: Secondary | ICD-10-CM | POA: Diagnosis not present

## 2023-09-07 DIAGNOSIS — K4090 Unilateral inguinal hernia, without obstruction or gangrene, not specified as recurrent: Secondary | ICD-10-CM | POA: Diagnosis not present

## 2023-09-07 DIAGNOSIS — R413 Other amnesia: Secondary | ICD-10-CM | POA: Diagnosis not present

## 2023-09-07 DIAGNOSIS — Z950 Presence of cardiac pacemaker: Secondary | ICD-10-CM | POA: Diagnosis not present

## 2023-09-07 DIAGNOSIS — Z8719 Personal history of other diseases of the digestive system: Secondary | ICD-10-CM | POA: Diagnosis not present

## 2023-09-07 NOTE — Telephone Encounter (Signed)
  Patient Consent for Virtual Visit       Alexander [name] has provided verbal consent on 09/07/2023 for a virtual visit (video or telephone).   CONSENT FOR VIRTUAL VISIT FOR:  Alexander [name]  By participating in this virtual visit I agree to the following:  I hereby voluntarily request, consent and authorize [facility] and its employed or contracted physicians, physician assistants, nurse practitioners or other licensed health care professionals (the Practitioner), to provide me with telemedicine health care services (the "Services) as deemed necessary by the treating Practitioner. I acknowledge and consent to receive the Services by the Practitioner via telemedicine. I understand that the telemedicine visit will involve communicating with the Practitioner through live audiovisual communication technology and the disclosure of certain medical information by electronic transmission. I acknowledge that I have been given the opportunity to request an in-person assessment or other available alternative prior to the telemedicine visit and am voluntarily participating in the telemedicine visit.  I understand that I have the right to withhold or withdraw my consent to the use of telemedicine in the course of my care at any time, without affecting my right to future care or treatment, and that the Practitioner or I may terminate the telemedicine visit at any time. I understand that I have the right to inspect all information obtained and/or recorded in the course of the telemedicine visit and may receive copies of available information for a reasonable fee.  I understand that some of the potential risks of receiving the Services via telemedicine include:  Delay or interruption in medical evaluation due to technological equipment failure or disruption; Information transmitted may not be sufficient (e.g. poor resolution of images) to allow for appropriate medical decision making by the  Practitioner; and/or  In rare instances, security protocols could fail, causing a breach of personal health information.  Furthermore, I acknowledge that it is my responsibility to provide information about my medical history, conditions and care that is complete and accurate to the best of my ability. I acknowledge that Practitioner's advice, recommendations, and/or decision may be based on factors not within their control, such as incomplete or inaccurate data provided by me or distortions of diagnostic images or specimens that may result from electronic transmissions. I understand that the practice of medicine is not an exact science and that Practitioner makes no warranties or guarantees regarding treatment outcomes. I acknowledge that a copy of this consent can be made available to me via my patient portal [facility] MyChart), or I can request a printed copy by calling the office of  [facility].    I understand that my insurance will be billed for this visit.   I have read or had this consent read to me. I understand the contents of this consent, which adequately explains the benefits and risks of the Services being provided via telemedicine.  I have been provided ample opportunity to ask questions regarding this consent and the Services and have had my questions answered to my satisfaction. I give my informed consent for the services to be provided through the use of telemedicine in my medical care

## 2023-09-07 NOTE — Telephone Encounter (Addendum)
 Pt came into office to discuss, I am deferring to Dr Vear. Previous note was sent directly to NP who is out. Pt would like the call to go to his wife Rock [phone].  I verbally spoke with Dr Vear, He reviewed the CT and MRI and recommended he stay off Kisunla for now due to the changes with MRI to be safe.  If pt does not want to do that, he recommends pt get second opinion from [facility].   I called wife and informed her of the above recommendations from Dr Vear. They are addiment about pt receiving another MRI and concerned that he restarts Kisunla ASAP.   Wife req I also discuss with Dr Margaret. I informed her he is out of the office due to a personal matter and I cannot guarantee when I will have a response. I will send to Dr Margaret as RICK.

## 2023-09-07 NOTE — Telephone Encounter (Signed)
 Lvm to call back for appointment

## 2023-09-07 NOTE — Telephone Encounter (Signed)
 Follow Up:      Patient's wife is calling to check on the status of patient's clearance. She says the patient. needs the clearance today.

## 2023-09-07 NOTE — Telephone Encounter (Signed)
 error

## 2023-09-07 NOTE — Telephone Encounter (Signed)
 Pt is concerned about the time that has gone by since last infusion, he reports there is bleeding.  Pt is requesting a MRI, pt asking that it be Harlene, NP that calls him.

## 2023-09-07 NOTE — Telephone Encounter (Signed)
 Contacted Dr. Sheldon Surgeon's office and confirmed no procedure has been set up yet for a cardiac clearance and spoke with patient wife who verbalized understanding and will contact Dr Sheldon office back in a  few days.

## 2023-09-07 NOTE — Telephone Encounter (Signed)
   Name: Alexander [name]  DOB: 1940/09/20  MRN: [id number]  Primary Cardiologist: None   Preoperative team, please contact this patient and set up a phone call appointment for further preoperative risk assessment. Please obtain consent and complete medication review. Thank you for your help.  I confirm that guidance regarding antiplatelet and oral anticoagulation therapy has been completed and, if necessary, noted below  None  I also confirmed the patient resides in the state of [place] . As per [facility] [employer] telemedicine laws, the patient must reside in the state in which the provider is licensed.   Wyn Raddle, Jackee Shove, NP 09/07/2023, 2:35 PM [facility]

## 2023-09-07 NOTE — Telephone Encounter (Signed)
   Pre-operative Risk Assessment    Patient Name: Alexander [name]  DOB: 1941-02-02 MRN: [id number]   Date of last office visit: 02/16/23 DR. Kanosh Date of next office visit: NONE   Request for Surgical Clearance    Procedure:  LAPAROSCOPIC AND POSSIBLE RIGHT INGUINAL HERNIA REPAIRS  Date of Surgery:  Clearance 09/18/23 URGENT                               Surgeon:  DR. ELSPETH GROSS Surgeon's Group or Practice Name:  [facility] Phone number:  [phone] Fax number:  [phone] RUSSELL CHRISTINE, CMA   Type of Clearance Requested:   - Medical ; NONE INDICATED TO BE HELD   Type of Anesthesia:  General    Additional requests/questions:    [name]   09/07/2023, 2:26 PM

## 2023-09-15 ENCOUNTER — Ambulatory Visit: Attending: Cardiology | Admitting: Nurse Practitioner

## 2023-09-15 ENCOUNTER — Encounter: Payer: Self-pay | Admitting: Cardiology

## 2023-09-15 ENCOUNTER — Ambulatory Visit: Payer: Medicare Other

## 2023-09-15 DIAGNOSIS — I495 Sick sinus syndrome: Secondary | ICD-10-CM | POA: Diagnosis not present

## 2023-09-15 DIAGNOSIS — Z01810 Encounter for preprocedural cardiovascular examination: Secondary | ICD-10-CM | POA: Diagnosis not present

## 2023-09-15 NOTE — Progress Notes (Signed)
 Virtual Visit via Telephone Note   Because of Alexander [name] co-morbid illnesses, he is at least at moderate risk for complications without adequate follow up.  This format is felt to be most appropriate for this patient at this time.  Due to technical limitations with video connection (technology), today's appointment will be conducted as an audio only telehealth visit, and Alexander [name] verbally agreed to proceed in this manner.   All issues noted in this document were discussed and addressed.  No physical exam could be performed with this format.  Evaluation Performed:  Preoperative cardiovascular risk assessment _____________   Date:  09/15/2023   Patient ID:  Alexander [name], DOB Aug 03, 1940, MRN [id number] Patient Location:  Home Provider location:   Office  Primary Care Provider:  [name], MD Primary Cardiologist:  None  Chief Complaint / Patient Profile   83 y.o. y/o male with a h/o nonobstructive CAD, sinus arrest s/p PPM, mitral valve prolapse s/p mitral valve repair, mild dilation of ascending aorta, hypertension, hyperlipidemia, and RBBB who is pending LAPAROSCOPIC AND POSSIBLE RIGHT INGUINAL HERNIA REPAIRS on 09/18/2023 with Dr. [name] of [facility] surgery and presents today for telephonic preoperative cardiovascular risk assessment.  History of Present Illness    Alexander [name] is a 83 y.o. male who presents via audio/video conferencing for a telehealth visit today.  Pt was last seen in cardiology clinic on 02/16/2023 by Dr. Raford. At that time Alexander [name] was doing well.  The patient is now pending procedure as outlined above. Since his last visit, he has done well from a cardiac standpoint.  He exercises 4 days a week.  He denies chest pain, palpitations, dyspnea, pnd, orthopnea, n, v, dizziness, syncope, edema, weight gain, or early satiety. All other systems reviewed and are otherwise negative except as noted  above.   Past Medical History    Past Medical History:  Diagnosis Date   Heart murmur    Hyperlipidemia    Hypertension    Mitral regurgitation    MVP (mitral valve prolapse)    OSA (obstructive sleep apnea)    AHI of 6.78/hr and O2 sats as low as 84%.  His OSA was moderate during REM sleep at 15.4/hr and 10.6/hr during supine sleep   Presence of permanent cardiac pacemaker    Snoring 09/07/2019   Syncope    Past Surgical History:  Procedure Laterality Date   BUBBLE STUDY  05/23/2019   Procedure: BUBBLE STUDY;  Surgeon: [name], MD;  [facility] ENDOSCOPY;  Service: Cardiovascular;;   HERNIA REPAIR     MITRAL VALVE REPAIR Right 06/14/2019   Procedure: MINIMALLY INVASIVE MITRAL VALVE REPAIR (MVR)<TEE;  Surgeon: [name], MD;  Location: [facility] OR;  Service: Open Heart Surgery;  Laterality: Right;   PACEMAKER IMPLANT N/A 10/12/2017   Procedure: PACEMAKER IMPLANT;  Surgeon: [name], MD;  Location: [facility] INVASIVE CV LAB;  Service: Cardiovascular;  Laterality: N/A;   RIGHT/LEFT HEART CATH AND CORONARY ANGIOGRAPHY N/A 06/06/2019   Procedure: RIGHT/LEFT HEART CATH AND CORONARY ANGIOGRAPHY;  Surgeon: [name], MD;  Location: [facility] INVASIVE CV LAB;  Service: Cardiovascular;  Laterality: N/A;   TEE WITHOUT CARDIOVERSION N/A 05/23/2019   Procedure: TRANSESOPHAGEAL ECHOCARDIOGRAM (TEE);  Surgeon: [name], MD;  Location: [facility] ENDOSCOPY;  Service: Cardiovascular;  Laterality: N/A;   TEE WITHOUT CARDIOVERSION N/A 06/14/2019   Procedure: TRANSESOPHAGEAL ECHOCARDIOGRAM (TEE);  Surgeon: [name], MD;  Location: [facility] OR;  Service:  Open Heart Surgery;  Laterality: N/A;   TONSILECTOMY, ADENOIDECTOMY, BILATERAL MYRINGOTOMY AND TUBES     as child   TONSILLECTOMY     TOTAL KNEE ARTHROPLASTY Left    TOTAL KNEE ARTHROPLASTY Left 02/23/2019   Procedure: TOTAL KNEE ARTHROPLASTY;  Surgeon: [name], MD;  Location: [facility] ORS;  Service: Orthopedics;  Laterality:  Left;     Allergies  No Known Allergies  Home Medications    Prior to Admission medications   Medication Sig Start Date End Date Taking? Authorizing Provider  aspirin  EC 81 MG tablet Take 81 mg by mouth daily. Swallow whole.    [provider]  ezetimibe (ZETIA) 10 MG tablet Take 10 mg by mouth daily. 07/21/19   [provider]  galantamine (RAZADYNE ER) 16 MG 24 hr capsule Take 16 mg by mouth daily with breakfast. 08/10/23   [provider]  inclisiran (LEQVIO ) 284 MG/1.5ML SOSY injection Inject 1.5ml every 6 months 09/04/22   [name], MD  metoprolol  succinate (TOPROL -XL) 50 MG 24 hr tablet TAKE ONE TABLET AT BEDTIME WITH OR IMMEDIATELY FOLLOWING A MEAL 03/10/23   Raford Riggs, MD  zolpidem  (AMBIEN ) 10 MG tablet Take 5-10 mg by mouth at bedtime as needed for sleep. 11/12/19   [provider]    Physical Exam    Vital Signs:  [name] does not have vital signs available for review today.  Given telephonic nature of communication, physical exam is limited. AAOx3. NAD. Normal affect.  Speech and respirations are unlabored.  Accessory Clinical Findings    None  Assessment & Plan    1.  Preoperative Cardiovascular Risk Assessment:  According to the Revised Cardiac Risk Index (RCRI), his Perioperative Risk of Major Cardiac Event is (%): 0.4. His Functional Capacity in METs is: 6.36 according to the [employer] Activity Status Index (DASI). Therefore, based on ACC/AHA guidelines, patient would be at acceptable risk for the planned procedure without further cardiovascular testing.  Of note, patient states that he prefers to avoid general anesthesia and would prefer spinal anesthesia for his upcoming surgery.  I encouraged him to discuss this with his surgeon.  The patient was advised that if he develops new symptoms prior to surgery to contact our office to arrange for a follow-up visit, and he verbalized understanding.  He may hold  Aspirin  for 5-7 days prior to surgery with a plan to resume it as soon as felt to be feasible from a surgical standpoint in the post-operative period.  A copy of this note will be routed to requesting surgeon.  Time:   Today, I have spent 6 minutes with the patient with telehealth technology discussing medical history, symptoms, and management plan.     Damien JAYSON Braver, NP  09/15/2023, 2:58 PM

## 2023-09-15 NOTE — Patient Instructions (Signed)
 SURGICAL WAITING ROOM VISITATION Patients having surgery or a procedure may have no more than 2 support people in the waiting area - these visitors may rotate in the visitor waiting room.   Due to an increase in RSV and influenza rates and associated hospitalizations, children ages 53 and under may not visit patients in [facility] hospitals. If the patient needs to stay at the hospital during part of their recovery, the visitor guidelines for inpatient rooms apply.  PRE-OP  VISITATION  Pre-op  nurse will coordinate an appropriate time for 1 support person to accompany the patient in pre-op .  This support person may not rotate.  This visitor will be contacted when the time is appropriate for the visitor to come back in the pre-op  area.  Please refer to the [facility] website for the visitor guidelines for Inpatients (after your surgery is over and you are in a regular room).  You are not required to quarantine at this time prior to your surgery. However, you must do this: Hand Hygiene often Do NOT share personal items Notify your provider if you are in close contact with someone who has COVID or you develop fever 100.4 or greater, new onset of sneezing, cough, sore throat, shortness of breath or body aches.  If you test positive for Covid or have been in contact with anyone that has tested positive in the last 10 days please notify you surgeon.    Your procedure is scheduled on:  09/18/23  Report to [facility] Main Entrance: [place] entrance where the [employer] is available.   Report to admitting at: 5:15 AM  Call this number if you have any questions or problems the morning of surgery [phone]  FOLLOW ANY ADDITIONAL PRE OP INSTRUCTIONS YOU RECEIVED FROM YOUR SURGEON'S OFFICE!!!  Do not eat food or drink fluids after Midnight the night prior to your surgery/procedure.   Oral Hygiene is also important to reduce your risk of infection.        Remember - BRUSH YOUR TEETH  THE MORNING OF SURGERY WITH YOUR REGULAR TOOTHPASTE  Do NOT smoke after Midnight the night before surgery.  STOP TAKING all Vitamins, Herbs and supplements 1 week before your surgery.   Take ONLY these medicines the morning of surgery with A SIP OF WATER : metoprolol ,galantamine.  If You have been diagnosed with Sleep Apnea - Bring CPAP mask and tubing day of surgery. We will provide you with a CPAP machine on the day of your surgery.                   You may not have any metal on your body including hair pins, jewelry, and body piercing  Do not wear lotions, powders, perfumes / cologne, or deodorant  Men may shave face and neck.  Contacts, Hearing Aids, dentures or bridgework may not be worn into surgery. DENTURES WILL BE REMOVED PRIOR TO SURGERY PLEASE DO NOT APPLY Poly grip OR ADHESIVES!!!  You may bring a small overnight bag with you on the day of surgery, only pack items that are not valuable. [facility] IS NOT RESPONSIBLE   FOR VALUABLES THAT ARE LOST OR STOLEN.   Patients discharged on the day of surgery will not be allowed to drive home.  Someone NEEDS to stay with you for the first 24 hours after anesthesia.  Do not bring your home medications to the hospital. The Pharmacy will dispense medications listed on your medication list to you during your admission in the  Hospital.  Special Instructions: Bring a copy of your healthcare power of attorney and living will documents the day of surgery, if you wish to have them scanned into your [facility] Records- EPIC  Please read over the following fact sheets you were given: IF YOU HAVE QUESTIONS ABOUT YOUR PRE-OP  INSTRUCTIONS, PLEASE CALL [phone]   [facility] - Preparing for Surgery Before surgery, you can play an important role.  Because skin is not sterile, your skin needs to be as free of germs as possible.  You can reduce the number of germs on your skin by washing with CHG (chlorahexidine gluconate) soap before  surgery.  CHG is an antiseptic cleaner which kills germs and bonds with the skin to continue killing germs even after washing. Please DO NOT use if you have an allergy to CHG or antibacterial soaps.  If your skin becomes reddened/irritated stop using the CHG and inform your nurse when you arrive at Short Stay. Do not shave (including legs and underarms) for at least 48 hours prior to the first CHG shower.  You may shave your face/neck.  Please follow these instructions carefully:  1.  Shower with CHG Soap the night before surgery and the  morning of surgery.  2.  If you choose to wash your hair, wash your hair first as usual with your normal  shampoo.  3.  After you shampoo, rinse your hair and body thoroughly to remove the shampoo.                             4.  Use CHG as you would any other liquid soap.  You can apply chg directly to the skin and wash.  Gently with a scrungie or clean washcloth.  5.  Apply the CHG Soap to your body ONLY FROM THE NECK DOWN.   Do not use on face/ open                           Wound or open sores. Avoid contact with eyes, ears mouth and genitals (private parts).                       Wash face,  Genitals (private parts) with your normal soap.             6.  Wash thoroughly, paying special attention to the area where your  surgery  will be performed.  7.  Thoroughly rinse your body with warm water  from the neck down.  8.  DO NOT shower/wash with your normal soap after using and rinsing off the CHG Soap.            9.  Pat yourself dry with a clean towel.            10.  Wear clean pajamas.            11.  Place clean sheets on your bed the night of your first shower and do not  sleep with pets.  ON THE DAY OF SURGERY : Do not apply any lotions/deodorants the morning of surgery.  Please wear clean clothes to the hospital/surgery center.     FAILURE TO FOLLOW THESE INSTRUCTIONS MAY RESULT IN THE CANCELLATION OF YOUR SURGERY  PATIENT  SIGNATURE_________________________________  NURSE SIGNATURE__________________________________  ________________________________________________________________________

## 2023-09-15 NOTE — Telephone Encounter (Signed)
 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 Patient has a f/u visit on 7/10. Based on recent conversation with patient and wife, looks like they are wanting to restart Kis[place]la. This will be further discussed at upcoming visit but if they are adamant on restarting and verbalize risks of restarting, would you agree to restart or maybe transition to [facility] or would restarting therapy at any point be highly discouraged?

## 2023-09-15 NOTE — Progress Notes (Signed)
 PERIOPERATIVE PRESCRIPTION FOR IMPLANTED CARDIAC DEVICE PROGRAMMING  Patient Information: Name:  Alexander [name]  DOB:  [birth year]  MRN:  [id number]    Planned Procedure:  Repair Hernia Inguinal Laparoscopic. Left.  Surgeon:  Dr. Elspeth Schultze  Date of Procedure:  09/18/23  Cautery will be used.  Position during surgery:    Please send documentation back to:  Darryle Law (Fax # [phone])  Device Information:  Clinic EP Physician:  Dr. Soyla Norton   Device Type:  Pacemaker Manufacturer and Phone #:  Medtronic: [id number] Pacemaker Dependent?:  No. Date of Last Device Check:  09/14/2023  Normal Device Function?:  Yes.    Electrophysiologist's Recommendations:  Have magnet available. Provide continuous ECG monitoring when magnet is used or reprogramming is to be performed.  Procedure may interfere with device function.  Magnet should be placed over device during procedure.  Per Device Clinic Standing Orders, Almarie ONEIDA Shutter, RN  9:02 AM 09/15/2023

## 2023-09-16 ENCOUNTER — Encounter (HOSPITAL_COMMUNITY): Payer: Self-pay

## 2023-09-16 ENCOUNTER — Encounter (HOSPITAL_COMMUNITY)
Admission: RE | Admit: 2023-09-16 | Discharge: 2023-09-16 | Disposition: A | Discharge status: Home / Self Care | Source: Ambulatory Visit | Attending: Surgery | Admitting: Surgery

## 2023-09-16 ENCOUNTER — Other Ambulatory Visit: Payer: Self-pay

## 2023-09-16 VITALS — BP 114/73 | HR 63 | Temp 98.1°F | Ht 70.0 in | Wt 167.0 lb

## 2023-09-16 DIAGNOSIS — K4090 Unilateral inguinal hernia, without obstruction or gangrene, not specified as recurrent: Secondary | ICD-10-CM | POA: Insufficient documentation

## 2023-09-16 DIAGNOSIS — I341 Nonrheumatic mitral (valve) prolapse: Secondary | ICD-10-CM | POA: Insufficient documentation

## 2023-09-16 DIAGNOSIS — I10 Essential (primary) hypertension: Secondary | ICD-10-CM | POA: Insufficient documentation

## 2023-09-16 DIAGNOSIS — G309 Alzheimer's disease, unspecified: Secondary | ICD-10-CM | POA: Diagnosis not present

## 2023-09-16 DIAGNOSIS — Z950 Presence of cardiac pacemaker: Secondary | ICD-10-CM | POA: Insufficient documentation

## 2023-09-16 DIAGNOSIS — F0280 Dementia in other diseases classified elsewhere without behavioral disturbance: Secondary | ICD-10-CM | POA: Insufficient documentation

## 2023-09-16 DIAGNOSIS — I2510 Atherosclerotic heart disease of native coronary artery without angina pectoris: Secondary | ICD-10-CM | POA: Insufficient documentation

## 2023-09-16 DIAGNOSIS — G4733 Obstructive sleep apnea (adult) (pediatric): Secondary | ICD-10-CM | POA: Diagnosis not present

## 2023-09-16 DIAGNOSIS — Z01818 Encounter for other preprocedural examination: Secondary | ICD-10-CM | POA: Diagnosis not present

## 2023-09-16 HISTORY — DX: Unspecified osteoarthritis, unspecified site: M19.90

## 2023-09-16 HISTORY — DX: Unspecified asthma, uncomplicated: J45.909

## 2023-09-16 LAB — BASIC METABOLIC PANEL WITH GFR
Anion gap: 8 (ref 5–15)
BUN: 23 mg/dL (ref 8–23)
CO2: 26 mmol/L (ref 22–32)
Calcium: 8.9 mg/dL (ref 8.9–10.3)
Chloride: 104 mmol/L (ref 98–111)
Creatinine, Ser: 1.25 mg/dL — ABNORMAL HIGH (ref 0.61–1.24)
GFR, Estimated: 57 mL/min — ABNORMAL LOW (ref >60)
Glucose, Bld: 113 mg/dL — ABNORMAL HIGH (ref 70–99)
Potassium: 3.8 mmol/L (ref 3.5–5.1)
Sodium: 138 mmol/L (ref 135–145)

## 2023-09-16 LAB — CBC
HCT: 43.1 % (ref 39.0–52.0)
Hemoglobin: 14.4 g/dL (ref 13.0–17.0)
MCH: 31.1 pg (ref 26.0–34.0)
MCHC: 33.4 g/dL (ref 30.0–36.0)
MCV: 93.1 fL (ref 80.0–100.0)
Platelets: 196 K/uL (ref 150–400)
RBC: 4.63 MIL/uL (ref 4.22–5.81)
RDW: 12.9 % (ref 11.5–15.5)
WBC: 7.6 K/uL (ref 4.0–10.5)
nRBC: 0 % (ref 0.0–0.2)

## 2023-09-16 NOTE — Progress Notes (Unsigned)
 [facility]  PATIENT: Alexander [name] DOB: 21-Aug-1940  REFERRING CLINICIAN: [name], MD HISTORY FROM: patient and wife REASON FOR VISIT: FOLLOW UP   HISTORICAL  CHIEF COMPLAINT:  No chief complaint on file.   HISTORY OF PRESENT ILLNESS:   Update 09/17/2023 JM: Patient returns for 88-month follow-up visit accompanied by his wife.  He was started on [place] shortly after prior visit and on repeat testing in April, this showed left frontal ARIA-E and ARIA-H and infusion was placed on hold, repeat imaging showed resolution of left frontal ARIA-E and unchanged ARIA-H. 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 Infusions remained on hold, placed on aspirin  and pursued further stroke workup including CTA head/neck which was largely unremarkable. Wife called office 2 weeks ago requesting repeat MRI and consider restarting infusions      Update 04/08/2023 JM: Patient returns for follow-up visit to repeat memory testing.  Patient came to office shortly after prior visit requesting to start donanemab monthly infusions.  Insurance required repeat memory testing as initial MMSE 27/30 which is considered normal.  MMSE today 23/30. Wife notes some gradual progression.  Patient is very interested in pursuing donanemab as this is administered monthly which would be better for his lifestyle.    UPDATE (02/16/2023, VRP): Since last visit, doing about the same.  Continues with some mild forgetfulness issues.  Wife notes more problems.  He is able to maintain most of his ADLs.  However she cannot rely on him to follow-up on to do list tasks like previously.  Testing has been completed.  Here to discuss amyloid targeting antibiotic therapy.  PRIOR HPI: 83 year old male, retired Investment banker, operational, here for evaluation of memory loss.  Past 6 to 12 months patient has had onset of mild short-term memory loss,  having difficulty remembering names, appointments and conversations.   Had an incident for his recent primary care office visit where he showed up at the wrong time.  This is unusual for him.  Otherwise he is able to maintain most of his own ADLs.  He is still driving, playing golf on a regular basis, able to be on his own.  However wife is noted that he is starting to become a little bit less reliable when she asks him to do tasks.  They are trying to use reminders, smart phone alarms and other compensation techniques.  At PCP visit on 12/01/2022, concern for possible mild dementia was raised due to family history in his mother.  He was started on galantamine and had phosphorylated tau testing done but not resulted yet.    REVIEW OF SYSTEMS: Full 14 system review of systems performed and negative with exception of: as per HPI.  ALLERGIES: No Known Allergies  HOME MEDICATIONS: Outpatient Medications Prior to Visit  Medication Sig Dispense Refill   aspirin  EC 81 MG tablet Take 81 mg by mouth daily. Swallow whole.     ezetimibe (ZETIA) 10 MG tablet Take 10 mg by mouth daily.     galantamine (RAZADYNE ER) 16 MG 24 hr capsule Take 16 mg by mouth daily with breakfast.     inclisiran (LEQVIO ) 284 MG/1.5ML SOSY injection Inject 1.5ml every 6 months 1.5 mL 0   metoprolol  succinate (TOPROL -XL) 50 MG 24 hr tablet TAKE ONE TABLET AT BEDTIME WITH OR IMMEDIATELY FOLLOWING A MEAL 90 tablet 3   zolpidem  (AMBIEN ) 10 MG tablet Take 5-10 mg by mouth at bedtime as needed for  sleep.     No facility-administered medications prior to visit.    PAST MEDICAL HISTORY: Past Medical History:  Diagnosis Date   Arthritis    Asthma    Heart murmur    Hyperlipidemia    Hypertension    Mitral regurgitation    MVP (mitral valve prolapse)    OSA (obstructive sleep apnea)    AHI of 6.78/hr and O2 sats as low as 84%.  His OSA was moderate during REM sleep at 15.4/hr and 10.6/hr during supine sleep   Presence of  permanent cardiac pacemaker    Snoring 09/07/2019   Syncope     PAST SURGICAL HISTORY: Past Surgical History:  Procedure Laterality Date   BUBBLE STUDY  05/23/2019   Procedure: BUBBLE STUDY;  Surgeon: [name], MD;  Location: [facility] ENDOSCOPY;  Service: Cardiovascular;;   CATARACT EXTRACTION, BILATERAL Bilateral    HERNIA REPAIR     MITRAL VALVE REPAIR Right 06/14/2019   Procedure: MINIMALLY INVASIVE MITRAL VALVE REPAIR (MVR)<TEE;  Surgeon: [name], MD;  Location: [facility] OR;  Service: Open Heart Surgery;  Laterality: Right;   PACEMAKER IMPLANT N/A 10/12/2017   Procedure: PACEMAKER IMPLANT;  Surgeon: [name], MD;  Location: [facility] INVASIVE CV LAB;  Service: Cardiovascular;  Laterality: N/A;   RIGHT/LEFT HEART CATH AND CORONARY ANGIOGRAPHY N/A 06/06/2019   Procedure: RIGHT/LEFT HEART CATH AND CORONARY ANGIOGRAPHY;  Surgeon: [name], MD;  Location: [facility] INVASIVE CV LAB;  Service: Cardiovascular;  Laterality: N/A;   TEE WITHOUT CARDIOVERSION N/A 05/23/2019   Procedure: TRANSESOPHAGEAL ECHOCARDIOGRAM (TEE);  Surgeon: [name], MD;  Location: [facility] ENDOSCOPY;  Service: Cardiovascular;  Laterality: N/A;   TEE WITHOUT CARDIOVERSION N/A 06/14/2019   Procedure: TRANSESOPHAGEAL ECHOCARDIOGRAM (TEE);  Surgeon: [name], MD;  Location: [facility] OR;  Service: Open Heart Surgery;  Laterality: N/A;   TONSILECTOMY, ADENOIDECTOMY, BILATERAL MYRINGOTOMY AND TUBES     as child   TONSILLECTOMY     TOTAL KNEE ARTHROPLASTY Left    TOTAL KNEE ARTHROPLASTY Left 02/23/2019   Procedure: TOTAL KNEE ARTHROPLASTY;  Surgeon: [name], MD;  Location: [facility] ORS;  Service: Orthopedics;  Laterality: Left;     FAMILY HISTORY: Family History  Problem Relation Age of Onset   Alzheimer's disease Mother    Hypertension Father     SOCIAL HISTORY: Social History   Socioeconomic History   Marital status: Married    Spouse name: Not on file   Number of children: Not  on file   Years of education: Not on file   Highest education level: Not on file  Occupational History   Not on file  Tobacco Use   Smoking status: Never   Smokeless tobacco: Never  Vaping Use   Vaping status: Never Used  Substance and Sexual Activity   Alcohol  use: Not Currently    Alcohol /week: 5.0 standard drinks of alcohol     Types: 5 Glasses of wine per week   Drug use: No   Sexual activity: Not on file  Other Topics Concern   Not on file  Social History Narrative   Pt lives with wife    Retired    Social Drivers of Corporate investment banker Strain: Not on file  Food Insecurity: Low Risk  (04/20/2023)   Received from [facility]   Hunger Vital Sign    Within the past 12 months, you worried that your food would run out before you got money to buy more: Never true  Within the past 12 months, the food you bought just didn't last and you didn't have money to get more. : Never true  Transportation Needs: No Transportation Needs (04/20/2023)   Received from [facility]    In the past 12 months, has lack of reliable transportation kept you from medical appointments, meetings, work or from getting things needed for daily living? : No  Physical Activity: Not on file  Stress: Not on file  Social Connections: Not on file  Intimate Partner Violence: Not on file     PHYSICAL EXAM  GENERAL EXAM/CONSTITUTIONAL: Vitals:  There were no vitals filed for this visit.   There is no height or weight on file to calculate BMI. Wt Readings from Last 3 Encounters:  09/16/23 167 lb (75.8 kg)  04/08/23 176 lb (79.8 kg)  02/16/23 175 lb (79.4 kg)   Patient is in no distress; very pleasant elderly Caucasian male, well developed, nourished and groomed; neck is supple  CARDIOVASCULAR: Examination of carotid arteries is normal; no carotid bruits Regular rate and rhythm, no murmurs Examination of peripheral vascular system by observation and palpation is  normal  EYES: Ophthalmoscopic exam of optic discs and posterior segments is normal; no papilledema or hemorrhages  MUSCULOSKELETAL: Gait, strength, tone, movements noted in Neurologic exam below  NEUROLOGIC: MENTAL STATUS:     04/08/2023   10:10 AM 12/03/2022    4:24 PM  MMSE - Mini Mental State Exam  Orientation to time 2 5  Orientation to Place 4 5  Registration 3 3  Attention/ Calculation 4 5  Recall 2 1  Language- name 2 objects 2 2  Language- repeat 1 1  Language- follow 3 step command 3 3  Language- read & follow direction 1 0  Write a sentence 1 1  Copy design 0 1  Total score 23 27   awake, alert, oriented to person, disoriented to year, season and date recent memory impaired and remote memory intact normal attention and concentration language fluent, comprehension intact, naming intact fund of knowledge appropriate  CRANIAL NERVE:  2nd - no papilledema on fundoscopic exam 2nd, 3rd, 4th, 6th - pupils equal and reactive to light, visual fields full to confrontation, extraocular muscles intact, no nystagmus 5th - facial sensation symmetric 7th - facial strength symmetric 8th - hearing intact 9th - palate elevates symmetrically, uvula midline 11th - shoulder shrug symmetric 12th - tongue protrusion midline  MOTOR:  normal bulk and tone, full strength in the BUE, BLE  SENSORY:  normal and symmetric to light touch, temperature, vibration  COORDINATION:  finger-nose-finger, fine finger movements normal  REFLEXES:  deep tendon reflexes present and symmetric  GAIT/STATION:  narrow based gait     DIAGNOSTIC DATA (LABS, IMAGING, TESTING) - I reviewed patient records, labs, notes, testing and imaging myself where available.  Lab Results  Component Value Date   WBC 7.6 09/16/2023   HGB 14.4 09/16/2023   HCT 43.1 09/16/2023   MCV 93.1 09/16/2023   PLT 196 09/16/2023      Component Value Date/Time   NA 138 09/16/2023 0943   NA 141 05/19/2019 1354    K 3.8 09/16/2023 0943   CL 104 09/16/2023 0943   CO2 26 09/16/2023 0943   GLUCOSE 113 (H) 09/16/2023 0943   BUN 23 09/16/2023 0943   BUN 20 05/19/2019 1354   CREATININE 1.25 (H) 09/16/2023 0943   CALCIUM  8.9 09/16/2023 0943   PROT 6.3 (L) 06/10/2019 1225   PROT 6.2  02/10/2019 1429   ALBUMIN  3.9 06/10/2019 1225   ALBUMIN  4.5 02/10/2019 1429   AST 26 06/10/2019 1225   ALT 18 06/10/2019 1225   ALKPHOS 67 06/10/2019 1225   BILITOT 0.4 06/10/2019 1225   BILITOT 0.5 02/10/2019 1429   GFRNONAA 57 (L) 09/16/2023 0943   GFRAA >60 06/19/2019 0332   Lab Results  Component Value Date   CHOL 169 02/10/2019   HDL 81 02/10/2019   LDLCALC 73 02/10/2019   TRIG 80 02/10/2019   CHOLHDL 2.1 02/10/2019   Lab Results  Component Value Date   HGBA1C 5.3 06/10/2019   Lab Results  Component Value Date   VITAMINB12 523 02/09/2023   Lab Results  Component Value Date   TSH 1.730 02/09/2023   02/09/23     Component Ref Range & Units 7 d ago  A -- Beta-amyloid 42/40 Ratio >0.102 0.106  Beta-amyloid 42 pg/mL 19.30  Beta-amyloid 40 pg/mL 181.72  T -- p-tau181 0.00 - 0.97 pg/mL 1.01 High   N -- NfL, Plasma 0.00 - 11.55 pg/mL 2.76  ATN SUMMARY Comment  Comment:                        A- T+ N- A high pTau181 concentration was observed. A normal beta-amyloid 42/40 ratio and normal concentration of NfL were observed at this time. These results are not consistent with the presence of Alzheimer's- related pathology, but may indicate pathology of another condition. Additional assessments may be necessary. These tests are intended to be used only in the context of clinical care.   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  12/16/22 MRI brain [I reviewed images myself and  agree with interpretation. Total of 3 chronic cerebral microhemorrhages. -VRP]  - 2 mm focus of susceptibility-weighted signal loss within the anterior right frontal lobe, likely reflecting a chronic microhemorrhage. Additional punctate chronic microhemorrhages along the medial aspect of the left caudate nucleus and within the right cerebellar hemisphere. 1. 4 mm lesion within the posterior aspect of the pituitary gland, as described. This may reflect a cyst or pituitary microadenoma. No further imaging evaluation or imaging follow-up is necessary. Correlate with history of pituitary hypersecretion. This follows ACR consensus guidelines: Management of Incidental Pituitary Findings on CT, MRI and F18-FDG PET: A White Paper of the ACR Incidental Findings Committee. J Am Coll Radiol 2018; 15: 966-72. 2. Mild chronic small ischemic changes within the cerebral white matter. 3. Mild generalized cerebral atrophy.    ASSESSMENT AND PLAN  83 y.o. year old male here with mild progressive short-term memory loss, recall difficulty, in last 6 to 12 months, with MMSE 27 out of 30 initially but today MMSE 23/30.  However cognitive changes have somewhat rapidly changed according to wife and other family members, especially compared to his prior high functioning level.  Dx:  No diagnosis found.    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) -Repeat MMSE today is within mild cognitive impairment -Patient interested in pursuing donanemab as this only requires monthly infusions -discussion regarding potential efficacy versus side effects including small risk for amyloid related imaging abnormalities (ARIA) H and E types, patient verbalized understanding -if donanemab approved by insurance, will require repeat MRI brain imaging prior  to the second, third, fourth and seventh infusions -MRI brain 12/2022 follow-up 3 chronic cerebral microhemorrhages --PET scan  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   Harlene Bogaert, AGNP-BC  [facility] [address] [place], [place] [address]  Phone [phone] Fax [phone] Note: This document was prepared with digital dictation and possible smart phrase technology. Any transcriptional errors that result from this process are unintentional.

## 2023-09-16 NOTE — Progress Notes (Addendum)
 For Anesthesia: PCP - [name], MD  Cardiologist - NONE Clearance: Daneen Damien BROCKS, NP : 09/15/23 Bowel Prep reminder:  Chest x-ray - CT angio: 09/05/23 EKG - 09/16/23 Stress Test -  ECHO - 07/17/23 Cardiac Cath -  Pacemaker/ICD device last checked: 09/14/23 Pacemaker orders received: Yes Device Rep notified: Yes  Spinal Cord Stimulator: N/A  Sleep Study - Yes CPAP - NO  Fasting Blood Sugar - N/A Checks Blood Sugar _____ times a day Date and result of last Hgb A1c-  Last dose of GLP1 agonist- N/A GLP1 instructions:   Last dose of SGLT-2 inhibitors- N/A SGLT-2 instructions:   Blood Thinner Instructions: Aspirin  Instructions: can be hold 5-7 days as per cardiologist. Last Dose:  Activity level: Can go up a flight of stairs and activities of daily living without stopping and without chest pain and/or shortness of breath   Able to exercise without chest pain and/or shortness of breath  Anesthesia review: Pt. Has a special request for anesthesia,he's concern about general anesthesia an his alzheimer diagnose.Harlene ORN PA talk to the pt. About it.  Hx: HTN.Nonobstructive CAD,Mitral valve repair,Murmur,Pacemaker,OSA(NO CPAP).  Patient denies shortness of breath, fever, cough and chest pain at PAT appointment   Patient verbalized understanding of instructions that were reviewed over the telephone.

## 2023-09-17 ENCOUNTER — Telehealth: Payer: Self-pay | Admitting: Surgery

## 2023-09-17 ENCOUNTER — Ambulatory Visit: Payer: Medicare Other | Admitting: Adult Health

## 2023-09-17 ENCOUNTER — Encounter: Payer: Self-pay | Admitting: Adult Health

## 2023-09-17 VITALS — BP 105/68 | HR 67 | Wt 170.0 lb

## 2023-09-17 DIAGNOSIS — G3184 Mild cognitive impairment, so stated: Secondary | ICD-10-CM | POA: Diagnosis not present

## 2023-09-17 DIAGNOSIS — G309 Alzheimer's disease, unspecified: Secondary | ICD-10-CM | POA: Diagnosis not present

## 2023-09-17 DIAGNOSIS — I63432 Cerebral infarction due to embolism of left posterior cerebral artery: Secondary | ICD-10-CM

## 2023-09-17 DIAGNOSIS — Z79899 Other long term (current) drug therapy: Secondary | ICD-10-CM | POA: Diagnosis not present

## 2023-09-17 LAB — CUP PACEART REMOTE DEVICE CHECK
Battery Remaining Longevity: 99 mo
Battery Voltage: 2.98 V
Brady Statistic AP VP Percent: 0.03 %
Brady Statistic AP VS Percent: 88.54 %
Brady Statistic AS VP Percent: 0 %
Brady Statistic AS VS Percent: 11.43 %
Brady Statistic RA Percent Paced: 88.72 %
Brady Statistic RV Percent Paced: 0.04 %
Date Time Interrogation Session: 20250707220219
Implantable Lead Connection Status: 753985
Implantable Lead Connection Status: 753985
Implantable Lead Implant Date: 20190805
Implantable Lead Implant Date: 20190805
Implantable Lead Location: 753859
Implantable Lead Location: 753860
Implantable Lead Model: 5076
Implantable Lead Model: 5076
Implantable Pulse Generator Implant Date: 20190805
Lead Channel Impedance Value: 285 Ohm
Lead Channel Impedance Value: 323 Ohm
Lead Channel Impedance Value: 399 Ohm
Lead Channel Impedance Value: 437 Ohm
Lead Channel Pacing Threshold Amplitude: 0.875 V
Lead Channel Pacing Threshold Amplitude: 0.875 V
Lead Channel Pacing Threshold Pulse Width: 0.4 ms
Lead Channel Pacing Threshold Pulse Width: 0.4 ms
Lead Channel Sensing Intrinsic Amplitude: 3.75 mV
Lead Channel Sensing Intrinsic Amplitude: 3.75 mV
Lead Channel Sensing Intrinsic Amplitude: 4.75 mV
Lead Channel Sensing Intrinsic Amplitude: 4.75 mV
Lead Channel Setting Pacing Amplitude: 2 V
Lead Channel Setting Pacing Amplitude: 2.5 V
Lead Channel Setting Pacing Pulse Width: 0.4 ms
Lead Channel Setting Sensing Sensitivity: 1.2 mV
Zone Setting Status: 755011
Zone Setting Status: 755011

## 2023-09-17 NOTE — Telephone Encounter (Signed)
 Able to reach the patient's wife over concerns   She noted concerns about general anesthesia with Alzheimer's.  She was also worried that he would get sedated in the holding area preoperatively.   I noted I was aware that there are some risks with general anesthesia but usually it is on the longer more complicated cases.  This is a relatively short case so I think the risks are rather minimal.  I noted I cannot do the laparoscopic hernia repair without general anesthesia.  It is pretty rare to do open repairs without that either or at least some very deep MAC sedation.  I noted we would do our best to minimize narcotics perioperatively.  I noted anesthesia usually does not give sedation or antianxiety meds in the preop short stay holding area unless patient has severe anxiety or has strong requests.  They do not do it routinely, so I expect they will honor the patient's wishes do not get preoperative sedation.  I worked to answer questions and concern of the patient's wife, Alexander [name], to her satisfaction.  She was appreciative.   I went and discussed with Dr. Kelly Mace with anesthesia.  She will relay to the anesthesia team in the morning to be sure to be able to address the patient and his wife's concerns and develop a plan to minimize risks and be safe.   Alexander KYM Schultze, MD, FACS, MASCRS Esophageal, Gastrointestinal & Colorectal Surgery Robotic and Minimally Invasive Surgery  [facility] [address] [place], [place] [address] [phone] Fax [phone] Main  CONTACT INFORMATION: Weekday (9AM-5PM): Call [facility] main office at [phone] Weeknight (5PM-9AM) or Weekend/Holiday: Check EPIC Web Links tab & use AMION (password  [email or web address]) for General Surgery [facility] coverage  Please, DO NOT use SecureChat  (it is not reliable communication to reach operating surgeons & will lead to a delay in care).   Epic staff messaging  available for outptient concerns needing 1-2 business day response.

## 2023-09-17 NOTE — Anesthesia Preprocedure Evaluation (Addendum)
 Anesthesia Evaluation  Patient identified by MRN, date of birth, ID band Patient awake    Reviewed: Allergy & Precautions, NPO status , Patient's Chart, lab work & pertinent test results  History of Anesthesia Complications Negative for: history of anesthetic complications  Airway Mallampati: III  TM Distance: >3 FB Neck ROM: Full   Comment: Previous grade II view with MAC 4, 2-handed mask with OPA Dental  (+) Dental Advisory Given   Pulmonary neg shortness of breath, asthma (does not use inhalers) , sleep apnea (does not use CPAP) , neg COPD, neg recent URI LLL pulmonary nodule   Pulmonary exam normal breath sounds clear to auscultation       Cardiovascular hypertension (metoprolol ), Pt. on home beta blockers (-) angina (-) Past MI, (-) Cardiac Stents and (-) CABG + dysrhythmias (RBBB) + pacemaker + Valvular Problems/Murmurs (s/p MV repair 06/14/2019) MVP and MR  Rhythm:Regular Rate:Normal  HLD  TTE 5/92/2025: IMPRESSIONS    1. Left ventricular ejection fraction, by estimation, is 60 to 65%. Left  ventricular ejection fraction by 3D volume is 63 %. The left ventricle has  normal function. The left ventricle has no regional wall motion  abnormalities. Left ventricular diastolic   parameters are consistent with Grade I diastolic dysfunction (impaired  relaxation).   2. Right ventricular systolic function is normal. The right ventricular  size is normal. There is normal pulmonary artery systolic pressure.   3. Right atrial size was severely dilated.   4. The mitral valve is normal in structure. Trivial mitral valve  regurgitation. No evidence of mitral stenosis. There is a prosthetic  annuloplasty ring present in the mitral position. Procedure Date:  06/14/2019.   5. The aortic valve is tricuspid. Aortic valve regurgitation is not  visualized. No aortic stenosis is present.   6. The inferior vena cava is normal in size with  greater than 50%  respiratory variability, suggesting right atrial pressure of 3 mmHg.     Neuro/Psych  PSYCHIATRIC DISORDERS (Alzheimer's)     Dementia negative neurological ROS     GI/Hepatic negative GI ROS, Neg liver ROS,,,  Endo/Other  negative endocrine ROS    Renal/GU negative Renal ROS     Musculoskeletal  (+) Arthritis ,    Abdominal   Peds  Hematology negative hematology ROS (+) Lab Results      Component                Value               Date                      WBC                      7.6                 09/16/2023                HGB                      14.4                09/16/2023                HCT                      43.1  09/16/2023                MCV                      93.1                09/16/2023                PLT                      196                 09/16/2023              Anesthesia Other Findings 83 y.o. y/o male with a h/o nonobstructive CAD, sinus arrest s/p PPM, mitral valve prolapse s/p mitral valve repair, mild dilation of ascending aorta, hypertension, hyperlipidemia, and RBBB who is pending LAPAROSCOPIC AND POSSIBLE RIGHT INGUINAL HERNIA REPAIRS on 09/18/2023   Electrophysiologist's Recommendations:    Have magnet available.  Provide continuous ECG monitoring when magnet is used or reprogramming is to be performed.   Procedure may interfere with device function.  Magnet should be placed over device during procedure.   Reproductive/Obstetrics                              Anesthesia Physical Anesthesia Plan  ASA: 3  Anesthesia Plan: General   Post-op Pain Management: Tylenol  PO (pre-op )*   Induction: Intravenous  PONV Risk Score and Plan: 2 and Ondansetron , Dexamethasone , Propofol  infusion, TIVA and Treatment may vary due to age or medical condition  Airway Management Planned: Oral ETT  Additional Equipment:   Intra-op Plan:   Post-operative Plan: Extubation in  OR  Informed Consent: I have reviewed the patients History and Physical, chart, labs and discussed the procedure including the risks, benefits and alternatives for the proposed anesthesia with the patient or authorized representative who has indicated his/her understanding and acceptance.     Dental advisory given  Plan Discussed with: CRNA and Anesthesiologist  Anesthesia Plan Comments: (Patient concerned about worsening memory issues after general anesthesia and requests spinal anesthesia. Discussed that the surgeon requires general anesthesia for this operation. Will avoid benzodiazepines and will minimize perioperative opioids and other sedating medications. Plan for TIVA with BIS monitoring.  Risks of general anesthesia discussed including, but not limited to, sore throat, hoarse voice, chipped/damaged teeth, injury to vocal cords, nausea and vomiting, allergic reactions, emergence delirium, POCD, lung infection, heart attack, stroke, and death. All questions answered. )         Anesthesia Quick Evaluation

## 2023-09-17 NOTE — Progress Notes (Addendum)
 Anesthesia Chart Review   Case: [id number] Date/Time: 09/18/23 0715   Procedure: REPAIR, HERNIA, INGUINAL, LAPAROSCOPIC (Left) - LAPAROSCOPIC LEFT AND POSSBLE RIGHT INGUINAL HERNIA REPAIRS   Anesthesia type: General   Diagnosis:      Inguinal hernia without obstruction or gangrene, recurrence not specified, unspecified laterality [K40.90]     H/O hernia repair [S01.109, Z87.19]   Pre-op  diagnosis: LEFT AND POSSIBLE RIGHT INGUINAL HERNIA REPAIRS   Location: [facility] ROOM 05 / [facility]   Surgeons: Sheldon Standing, MD       DISCUSSION:83 y.o. never smoker with h/o HTN, OSA with CPAP, nonobstructive CAD by cath, sinus arrest status post MDT pacemaker, mitral valve prolapse s/p mitral valve repair 06/2019, pt had post operative a-fib following mv repair without recurrence, RBBB, pacemaker in place due to h/o sinus arrest (device orders in 09/15/2023 progress note), left and possible right inguinal hernia repair scheduled for above procedure 09/18/2023 with Dr. Standing Sheldon.   Pt last seen by cardiology 09/15/2023. Per OV note pt exercises 4 days per week.  Denies chest pain, palpitations, shortness of breath. Per OV note, According to the Revised Cardiac Risk Index (RCRI), his Perioperative Risk of Major Cardiac Event is (%): 0.4. His Functional Capacity in METs is: 6.36 according to the Duke Activity Status Index (DASI). Therefore, based on ACC/AHA guidelines, patient would be at acceptable risk for the planned procedure without further cardiovascular testing.  Of note, patient states that he prefers to avoid general anesthesia and would prefer spinal anesthesia for his upcoming surgery.  I encouraged him to discuss this with his surgeon.  Pt with mild cognitive impairment due to Alzheimer's disease.  Followed by neurology. Receiving donanemab infusions and taking galantamine.  He has concerns regarding anesthesia and worsening memory loss/alzheimer's sx.  He would like to discuss with anesthesia DOS and mitigate  exacerbation of symptoms if possible.   VS: BP 114/73   Pulse 63   Temp 36.7 C (Oral)   Ht 5' 10 (1.778 m)   Wt 75.8 kg   SpO2 99%   BMI 23.96 kg/m   PROVIDERS: [name], MD is PCP    LABS: Labs reviewed: Acceptable for surgery. (all labs ordered are listed, but only abnormal results are displayed)  Labs Reviewed  BASIC METABOLIC PANEL WITH GFR - Abnormal; Notable for the following components:      Result Value   Glucose, Bld 113 (*)    Creatinine, Ser 1.25 (*)    GFR, Estimated 57 (*)    All other components within normal limits  CBC     IMAGES:   EKG:   CV: Echo 07/17/2023 1. Left ventricular ejection fraction, by estimation, is 60 to 65%. Left  ventricular ejection fraction by 3D volume is 63 %. The left ventricle has  normal function. The left ventricle has no regional wall motion  abnormalities. Left ventricular diastolic   parameters are consistent with Grade I diastolic dysfunction (impaired  relaxation).   2. Right ventricular systolic function is normal. The right ventricular  size is normal. There is normal pulmonary artery systolic pressure.   3. Right atrial size was severely dilated.   4. The mitral valve is normal in structure. Trivial mitral valve  regurgitation. No evidence of mitral stenosis. There is a prosthetic  annuloplasty ring present in the mitral position. Procedure Date:  06/14/2019.   5. The aortic valve is tricuspid. Aortic valve regurgitation is not  visualized. No aortic stenosis is present.   6. The inferior  vena cava is normal in size with greater than 50%  respiratory variability, suggesting right atrial pressure of 3 mmHg.  Past Medical History:  Diagnosis Date   Arthritis    Asthma    Heart murmur    Hyperlipidemia    Hypertension    Mitral regurgitation    MVP (mitral valve prolapse)    OSA (obstructive sleep apnea)    AHI of 6.78/hr and O2 sats as low as 84%.  His OSA was moderate during REM sleep at 15.4/hr and  10.6/hr during supine sleep   Presence of permanent cardiac pacemaker    Snoring 09/07/2019   Syncope     Past Surgical History:  Procedure Laterality Date   BUBBLE STUDY  05/23/2019   Procedure: BUBBLE STUDY;  Surgeon: [name], MD;  Location: [facility] ENDOSCOPY;  Service: Cardiovascular;;   CATARACT EXTRACTION, BILATERAL Bilateral    HERNIA REPAIR     MITRAL VALVE REPAIR Right 06/14/2019   Procedure: MINIMALLY INVASIVE MITRAL VALVE REPAIR (MVR)<TEE;  Surgeon: [name], MD;  Location: [facility] OR;  Service: Open Heart Surgery;  Laterality: Right;   PACEMAKER IMPLANT N/A 10/12/2017   Procedure: PACEMAKER IMPLANT;  Surgeon: [name], MD;  [facility]: [facility] INVASIVE CV LAB;  Service: Cardiovascular;  Laterality: N/A;   RIGHT/LEFT HEART CATH AND CORONARY ANGIOGRAPHY N/A 06/06/2019   Procedure: RIGHT/LEFT HEART CATH AND CORONARY ANGIOGRAPHY;  Surgeon: [name], MD;  Location: [facility] INVASIVE CV LAB;  Service: Cardiovascular;  Laterality: N/A;   TEE WITHOUT CARDIOVERSION N/A 05/23/2019   Procedure: TRANSESOPHAGEAL ECHOCARDIOGRAM (TEE);  Surgeon: [name], MD;  Location: [facility] ENDOSCOPY;  Service: Cardiovascular;  Laterality: N/A;   TEE WITHOUT CARDIOVERSION N/A 06/14/2019   Procedure: TRANSESOPHAGEAL ECHOCARDIOGRAM (TEE);  Surgeon: [name], MD;  Location: [facility] OR;  Service: Open Heart Surgery;  Laterality: N/A;   TONSILECTOMY, ADENOIDECTOMY, BILATERAL MYRINGOTOMY AND TUBES     as child   TONSILLECTOMY     TOTAL KNEE ARTHROPLASTY Left    TOTAL KNEE ARTHROPLASTY Left 02/23/2019   Procedure: TOTAL KNEE ARTHROPLASTY;  Surgeon: [name], MD;  Location[facility];  Service: Orthopedics;  Laterality: Left;     MEDICATIONS:  aspirin  EC 81 MG tablet   ezetimibe (ZETIA) 10 MG tablet   galantamine (RAZADYNE ER) 16 MG 24 hr capsule   inclisiran (LEQVIO ) 284 MG/1.5ML SOSY injection   metoprolol  succinate (TOPROL -XL) 50 MG 24 hr tablet   zolpidem   (AMBIEN ) 10 MG tablet   No current facility-administered medications for this encounter.     Harlene Hoots Ward, PA-C [facility] Pre-Surgical Testing [phone]

## 2023-09-18 ENCOUNTER — Ambulatory Visit (HOSPITAL_COMMUNITY)
Admission: RE | Admit: 2023-09-18 | Discharge: 2023-09-18 | Disposition: A | Discharge status: Home / Self Care | Attending: Surgery | Admitting: Surgery

## 2023-09-18 ENCOUNTER — Encounter (HOSPITAL_COMMUNITY): Payer: Self-pay | Admitting: Surgery

## 2023-09-18 ENCOUNTER — Ambulatory Visit (HOSPITAL_BASED_OUTPATIENT_CLINIC_OR_DEPARTMENT_OTHER): Payer: Self-pay | Admitting: Anesthesiology

## 2023-09-18 ENCOUNTER — Ambulatory Visit (HOSPITAL_COMMUNITY): Payer: Self-pay | Admitting: Medical

## 2023-09-18 ENCOUNTER — Other Ambulatory Visit: Payer: Self-pay

## 2023-09-18 ENCOUNTER — Encounter (HOSPITAL_COMMUNITY)
Admission: RE | Disposition: A | Discharge status: Home / Self Care | Payer: Self-pay | Source: Home / Self Care | Attending: Surgery

## 2023-09-18 DIAGNOSIS — I4510 Unspecified right bundle-branch block: Secondary | ICD-10-CM | POA: Diagnosis not present

## 2023-09-18 DIAGNOSIS — F0390 Unspecified dementia without behavioral disturbance: Secondary | ICD-10-CM | POA: Insufficient documentation

## 2023-09-18 DIAGNOSIS — G4733 Obstructive sleep apnea (adult) (pediatric): Secondary | ICD-10-CM | POA: Diagnosis not present

## 2023-09-18 DIAGNOSIS — K660 Peritoneal adhesions (postprocedural) (postinfection): Secondary | ICD-10-CM

## 2023-09-18 DIAGNOSIS — K4090 Unilateral inguinal hernia, without obstruction or gangrene, not specified as recurrent: Secondary | ICD-10-CM

## 2023-09-18 DIAGNOSIS — Z950 Presence of cardiac pacemaker: Secondary | ICD-10-CM | POA: Diagnosis not present

## 2023-09-18 DIAGNOSIS — I10 Essential (primary) hypertension: Secondary | ICD-10-CM | POA: Insufficient documentation

## 2023-09-18 DIAGNOSIS — I340 Nonrheumatic mitral (valve) insufficiency: Secondary | ICD-10-CM | POA: Diagnosis not present

## 2023-09-18 DIAGNOSIS — K4190 Unilateral femoral hernia, without obstruction or gangrene, not specified as recurrent: Secondary | ICD-10-CM | POA: Diagnosis not present

## 2023-09-18 DIAGNOSIS — Z7982 Long term (current) use of aspirin: Secondary | ICD-10-CM | POA: Insufficient documentation

## 2023-09-18 DIAGNOSIS — Z79899 Other long term (current) drug therapy: Secondary | ICD-10-CM | POA: Insufficient documentation

## 2023-09-18 HISTORY — DX: Dementia in other diseases classified elsewhere, unspecified severity, without behavioral disturbance, psychotic disturbance, mood disturbance, and anxiety: F02.80

## 2023-09-18 HISTORY — PX: INGUINAL HERNIA REPAIR: SHX194

## 2023-09-18 SURGERY — REPAIR, HERNIA, INGUINAL, LAPAROSCOPIC
Anesthesia: General | Site: Abdomen | Laterality: Left

## 2023-09-18 MED ORDER — ORAL CARE MOUTH RINSE: 15.0000 mL | Freq: Once | OROMUCOSAL | Status: AC

## 2023-09-18 MED ORDER — STERILE WATER FOR IRRIGATION IR SOLN
Status: DC | PRN
  Administered 2023-09-18: 1000 mL

## 2023-09-18 MED ORDER — PHENYLEPHRINE 80 MCG/ML (10ML) SYRINGE FOR IV PUSH (FOR BLOOD PRESSURE SUPPORT)
PREFILLED_SYRINGE | INTRAVENOUS | Status: AC
Start: 2023-09-18 — End: 2023-09-18
  Filled 2023-09-18: qty 10

## 2023-09-18 MED ORDER — LACTATED RINGERS IV SOLN: INTRAVENOUS | Status: DC

## 2023-09-18 MED ORDER — FENTANYL CITRATE (PF) 100 MCG/2ML IJ SOLN
INTRAMUSCULAR | Status: DC | PRN
  Administered 2023-09-18 (×2): 25 ug via INTRAVENOUS
  Administered 2023-09-18: 50 ug via INTRAVENOUS

## 2023-09-18 MED ORDER — ROCURONIUM BROMIDE 100 MG/10ML IV SOLN
INTRAVENOUS | Status: DC | PRN
  Administered 2023-09-18 (×2): 10 mg via INTRAVENOUS
  Administered 2023-09-18: 20 mg via INTRAVENOUS
  Administered 2023-09-18: 50 mg via INTRAVENOUS
  Administered 2023-09-18: 20 mg via INTRAVENOUS

## 2023-09-18 MED ORDER — BUPIVACAINE LIPOSOME 1.3 % IJ SUSP: 20.0000 mL | Freq: Once | INTRAMUSCULAR | Status: DC

## 2023-09-18 MED ORDER — SODIUM CHLORIDE 0.9 % IV SOLN: 250.0000 mL | INTRAVENOUS | Status: DC | PRN

## 2023-09-18 MED ORDER — PROPOFOL 10 MG/ML IV BOLUS
INTRAVENOUS | Status: AC
Start: 2023-09-18 — End: 2023-09-18
  Filled 2023-09-18: qty 20

## 2023-09-18 MED ORDER — CHLORHEXIDINE GLUCONATE CLOTH 2 % EX PADS: 6.0000 | MEDICATED_PAD | Freq: Once | CUTANEOUS | Status: DC

## 2023-09-18 MED ORDER — LACTATED RINGERS IR SOLN
Status: DC | PRN
  Administered 2023-09-18: 1000 mL

## 2023-09-18 MED ORDER — BUPIVACAINE LIPOSOME 1.3 % IJ SUSP
INTRAMUSCULAR | Status: AC
  Filled 2023-09-18: qty 20

## 2023-09-18 MED ORDER — ONDANSETRON HCL 4 MG/2ML IJ SOLN
INTRAMUSCULAR | Status: AC
Start: 2023-09-18 — End: 2023-09-18
  Filled 2023-09-18: qty 2

## 2023-09-18 MED ORDER — FENTANYL CITRATE (PF) 100 MCG/2ML IJ SOLN
INTRAMUSCULAR | Status: AC
  Filled 2023-09-18: qty 2

## 2023-09-18 MED ORDER — FENTANYL CITRATE PF 50 MCG/ML IJ SOSY: 25.0000 ug | PREFILLED_SYRINGE | INTRAMUSCULAR | Status: DC | PRN

## 2023-09-18 MED ORDER — LIDOCAINE HCL 2 % IJ SOLN
INTRAMUSCULAR | Status: AC
  Filled 2023-09-18: qty 20

## 2023-09-18 MED ORDER — SODIUM CHLORIDE 0.9% FLUSH: 3.0000 mL | Freq: Two times a day (BID) | INTRAVENOUS | Status: DC

## 2023-09-18 MED ORDER — LIDOCAINE HCL (CARDIAC) PF 100 MG/5ML IV SOSY
PREFILLED_SYRINGE | INTRAVENOUS | Status: DC | PRN
  Administered 2023-09-18: 100 mg via INTRAVENOUS

## 2023-09-18 MED ORDER — CEFAZOLIN SODIUM-DEXTROSE 2-4 GM/100ML-% IV SOLN
2.0000 g | INTRAVENOUS | Status: AC
  Administered 2023-09-18: 2 g via INTRAVENOUS
  Filled 2023-09-18: qty 100

## 2023-09-18 MED ORDER — PROPOFOL 1000 MG/100ML IV EMUL
INTRAVENOUS | Status: AC
  Filled 2023-09-18: qty 100

## 2023-09-18 MED ORDER — OXYCODONE HCL 5 MG PO TABS: 5.0000 mg | ORAL_TABLET | Freq: Once | ORAL | Status: DC | PRN

## 2023-09-18 MED ORDER — 0.9 % SODIUM CHLORIDE (POUR BTL) OPTIME
TOPICAL | Status: DC | PRN
  Administered 2023-09-18: 1000 mL

## 2023-09-18 MED ORDER — ACETAMINOPHEN 500 MG PO TABS
1000.0000 mg | ORAL_TABLET | ORAL | Status: AC
  Administered 2023-09-18: 1000 mg via ORAL
  Filled 2023-09-18: qty 2

## 2023-09-18 MED ORDER — PHENYLEPHRINE HCL (PRESSORS) 10 MG/ML IV SOLN
INTRAVENOUS | Status: DC | PRN
  Administered 2023-09-18: 40 ug via INTRAVENOUS

## 2023-09-18 MED ORDER — ROCURONIUM BROMIDE 10 MG/ML (PF) SYRINGE
PREFILLED_SYRINGE | INTRAVENOUS | Status: AC
  Filled 2023-09-18: qty 10

## 2023-09-18 MED ORDER — ONDANSETRON HCL 4 MG/2ML IJ SOLN
INTRAMUSCULAR | Status: DC | PRN
  Administered 2023-09-18: 4 mg via INTRAVENOUS

## 2023-09-18 MED ORDER — OXYCODONE HCL 5 MG/5ML PO SOLN: 5.0000 mg | Freq: Once | ORAL | Status: DC | PRN

## 2023-09-18 MED ORDER — PROPOFOL 10 MG/ML IV BOLUS
INTRAVENOUS | Status: DC | PRN
  Administered 2023-09-18: 95 ug/kg/min via INTRAVENOUS
  Administered 2023-09-18: 100 mg via INTRAVENOUS

## 2023-09-18 MED ORDER — SODIUM CHLORIDE 0.9% FLUSH: 3.0000 mL | INTRAVENOUS | Status: DC | PRN

## 2023-09-18 MED ORDER — DEXAMETHASONE SODIUM PHOSPHATE 10 MG/ML IJ SOLN
4.0000 mg | INTRAMUSCULAR | Status: AC
  Administered 2023-09-18: 4 mg via INTRAVENOUS

## 2023-09-18 MED ORDER — DEXAMETHASONE SODIUM PHOSPHATE 10 MG/ML IJ SOLN
INTRAMUSCULAR | Status: AC
  Filled 2023-09-18: qty 1

## 2023-09-18 MED ORDER — CHLORHEXIDINE GLUCONATE 0.12 % MT SOLN
15.0000 mL | Freq: Once | OROMUCOSAL | Status: AC
  Administered 2023-09-18: 15 mL via OROMUCOSAL

## 2023-09-18 MED ORDER — BUPIVACAINE-EPINEPHRINE (PF) 0.25% -1:200000 IJ SOLN
INTRAMUSCULAR | Status: AC
  Filled 2023-09-18: qty 60

## 2023-09-18 MED ORDER — SUGAMMADEX SODIUM 200 MG/2ML IV SOLN
INTRAVENOUS | Status: DC | PRN
  Administered 2023-09-18: 200 mg via INTRAVENOUS

## 2023-09-18 MED ORDER — LACTATED RINGERS IV SOLN: INTRAVENOUS | Status: DC | PRN

## 2023-09-18 MED ORDER — TRAMADOL HCL 50 MG PO TABS: 50.0000 mg | ORAL_TABLET | Freq: Four times a day (QID) | ORAL | 0 refills | Status: AC | PRN

## 2023-09-18 MED ORDER — AMISULPRIDE (ANTIEMETIC) 5 MG/2ML IV SOLN: 10.0000 mg | Freq: Once | INTRAVENOUS | Status: DC | PRN

## 2023-09-18 MED ORDER — BUPIVACAINE-EPINEPHRINE 0.25% -1:200000 IJ SOLN
INTRAMUSCULAR | Status: DC | PRN
  Administered 2023-09-18: 80 mL

## 2023-09-18 SURGICAL SUPPLY — 38 items
BAG COUNTER SPONGE SURGICOUNT (BAG) IMPLANT
CABLE HIGH FREQUENCY MONO STRZ (ELECTRODE) ×1 IMPLANT
CHLORAPREP W/TINT 26 (MISCELLANEOUS) ×1 IMPLANT
COVER SURGICAL LIGHT HANDLE (MISCELLANEOUS) ×1 IMPLANT
DEVICE SECURE STRAP 25 ABSORB (INSTRUMENTS) IMPLANT
DRAPE WARM FLUID 44X44 (DRAPES) ×1 IMPLANT
DRSG TEGADERM 2-3/8X2-3/4 SM (GAUZE/BANDAGES/DRESSINGS) ×2 IMPLANT
DRSG TEGADERM 4X4.75 (GAUZE/BANDAGES/DRESSINGS) ×1 IMPLANT
ELECT REM PT RETURN 15FT ADLT (MISCELLANEOUS) ×1 IMPLANT
GAUZE SPONGE 2X2 8PLY STRL LF (GAUZE/BANDAGES/DRESSINGS) ×1 IMPLANT
GLOVE ECLIPSE 8.0 STRL XLNG CF (GLOVE) ×1 IMPLANT
GLOVE INDICATOR 8.0 STRL GRN (GLOVE) ×1 IMPLANT
GOWN STRL REUS W/ TWL XL LVL3 (GOWN DISPOSABLE) ×1 IMPLANT
IRRIGATION SUCT STRKRFLW 2 WTP (MISCELLANEOUS) IMPLANT
KIT BASIN OR (CUSTOM PROCEDURE TRAY) ×1 IMPLANT
KIT TURNOVER KIT A (KITS) ×1 IMPLANT
MARKER SKIN DUAL TIP RULER LAB (MISCELLANEOUS) ×1 IMPLANT
MESH HERNIA 6X6 BARD (Mesh General) IMPLANT
NDL INSUFFLATION 14GA 120MM (NEEDLE) IMPLANT
NEEDLE INSUFFLATION 14GA 120MM (NEEDLE) IMPLANT
PAD POSITIONING PINK XL (MISCELLANEOUS) ×1 IMPLANT
SCISSORS LAP 5X35 DISP (ENDOMECHANICALS) ×1 IMPLANT
SET TUBE SMOKE EVAC HIGH FLOW (TUBING) ×1 IMPLANT
SLEEVE ADV FIXATION 12X100MM (TROCAR) IMPLANT
SLEEVE ADV FIXATION 5X100MM (TROCAR) ×2 IMPLANT
SPIKE FLUID TRANSFER (MISCELLANEOUS) ×1 IMPLANT
SUT MNCRL AB 4-0 PS2 18 (SUTURE) ×1 IMPLANT
SUT PDS AB 1 CT1 27 (SUTURE) ×2 IMPLANT
SUT STRATAFIX SPIRAL PDS3-0 (SUTURE) IMPLANT
SUT VIC AB 2-0 SH 27X BRD (SUTURE) IMPLANT
SUT VICRYL 0 UR6 27IN ABS (SUTURE) ×1 IMPLANT
SUTURE STRATFX SPIRAL 3-0 PDS+ (SUTURE) IMPLANT
SUTURE V-LC BRB 180 2/0GR6GS22 (SUTURE) IMPLANT
TOWEL OR 17X26 10 PK STRL BLUE (TOWEL DISPOSABLE) ×1 IMPLANT
TRAY FOLEY MTR SLVR 16FR STAT (SET/KITS/TRAYS/PACK) IMPLANT
TRAY LAPAROSCOPIC (CUSTOM PROCEDURE TRAY) ×1 IMPLANT
TROCAR ADV FIXATION 5X100MM (TROCAR) ×1 IMPLANT
TROCAR BALLN 12MMX100 BLUNT (TROCAR) ×1 IMPLANT

## 2023-09-18 NOTE — Interval H&P Note (Signed)
 History and Physical Interval Note:  09/18/2023 7:08 AM  Alexander [name]  has presented today for surgery, with the diagnosis of LEFT AND POSSIBLE RIGHT INGUINAL HERNIA REPAIRS.  The various methods of treatment have been discussed with the patient and family. After consideration of risks, benefits and other options for treatment, the patient has consented to  Procedure(s) with comments: REPAIR, HERNIA, INGUINAL, LAPAROSCOPIC (Left) - LAPAROSCOPIC LEFT AND POSSBLE RIGHT INGUINAL HERNIA REPAIRS as a surgical intervention.  The patient's history has been reviewed, patient examined, no change in status, stable for surgery.  I have reviewed the patient's chart and labs.  Questions were answered to the patient's satisfaction.    I have re-reviewed the the patient's records, history, medications, and allergies.  I have re-examined the patient.  I again discussed intraoperative plans and goals of post-operative recovery.  The patient agrees to proceed.  Zebulun Deman [facility]  29-Sep-1940 [id number]  Patient Care Team: [name], MD as PCP - General (Internal Medicine) Sheldon Standing, MD as Consulting Physician (General Surgery) Margaret Eduard SAUNDERS, MD as Consulting Physician (Neurology) [name], MD as Consulting Physician (Clinical Cardiac Electrophysiology) Raford Riggs, MD as Attending Physician (Cardiology) Fleeta Rothman, Jomarie SAILOR, MD as Consulting Physician (Infectious Diseases)  Patient Active Problem List   Diagnosis Date Noted   OSA (obstructive sleep apnea) 09/04/2022   Snoring 09/07/2019   S/P minimally invasive mitral valve repair 06/14/2019   Severe mitral regurgitation    OA (osteoarthritis) of knee 02/23/2019   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  His OSA was moderate during REM sleep at 15.4/hr and 10.6/hr during supine sleep   Presence of permanent cardiac pacemaker    Snoring 09/07/2019   Syncope     Past Surgical History:  Procedure Laterality Date   BUBBLE STUDY  05/23/2019   Procedure: BUBBLE STUDY;  Surgeon: [name], MD;  Location: [facility] ENDOSCOPY;  Service: Cardiovascular;;   CATARACT EXTRACTION, BILATERAL Bilateral    HERNIA REPAIR     MITRAL VALVE REPAIR Right 06/14/2019   Procedure: MINIMALLY INVASIVE MITRAL VALVE REPAIR (MVR)<TEE;  Surgeon: [name], MD;  Location: [facility] OR;  Service: Open Heart Surgery;  Laterality: Right;   PACEMAKER IMPLANT N/A 10/12/2017   Procedure: PACEMAKER IMPLANT;  Surgeon: [name], MD;  [facility]VASIVE CV LAB;  Service: Cardiovascular;  Laterality: N/A;   RIGHT/LEFT HEART CATH AND CORONARY ANGIOGRAPHY N/A 06/06/2019   Procedure: RIGHT/LEFT HEART CATH AND CORONARY ANGIOGRAPHY;  Surgeon: [name], MD;  Location: [facility]VASIVE CV LAB;  Service: Cardiovascular;  Laterality: N/A;   TEE WITHOUT CARDIOVERSION N/A 05/23/2019   Procedure: TRANSESOPHAGEAL ECHOCARDIOGRAM (TEE);  Surgeon: [name], MD;  Location: [facility] ENDOSCOPY;  Service: Cardiovascular;  Laterality: N/A;   TEE WITHOUT CARDIOVERSION N/A 06/14/2019   Procedure: TRANSESOPHAGEAL ECHOCARDIOGRAM (TEE);  Surgeon: [name], MD;  Location: [facility]  OR;  Service: Open Heart Surgery;  Laterality: N/A;   TONSILECTOMY, ADENOIDECTOMY, BILATERAL MYRINGOTOMY AND TUBES     as child   TONSILLECTOMY     TOTAL KNEE ARTHROPLASTY Left    TOTAL KNEE ARTHROPLASTY Left 02/23/2019   Procedure: TOTAL KNEE ARTHROPLASTY;  Surgeon: [name], MD;   Location: [facility];  Service: Orthopedics;  Laterality: Left;     Social History   Socioeconomic History   Marital status: Married    Spouse name: Not on file   Number of children: Not on file   Years of education: Not on file   Highest education level: Not on file  Occupational History   Not on file  Tobacco Use   Smoking status: Never    Passive exposure: Never   Smokeless tobacco: Never  Vaping Use   Vaping status: Never Used  Substance and Sexual Activity   Alcohol  use: Not Currently    Alcohol /week: 5.0 standard drinks of alcohol     Types: 5 Glasses of wine per week   Drug use: No   Sexual activity: Not on file  Other Topics Concern   Not on file  Social History Narrative   Pt lives with wife    Retired    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 : Never true  Transportation Needs: No Transportation Needs (04/20/2023)   Received from [facility]    In the past 12 months, has lack of reliable transportation kept you from medical appointments, meetings, work or from getting things needed for daily living? : No  Physical Activity: Not on file  Stress: Not on file  Social Connections: Not on file  Intimate Partner Violence: Not on file    Family History  Problem Relation Age of Onset   Alzheimer's disease Mother    Hypertension Father     Medications Prior to Admission  Medication Sig Dispense Refill Last Dose/Taking   aspirin  EC 81 MG tablet Take 81 mg by mouth daily. Swallow whole.   09/14/2023   ezetimibe (ZETIA) 10 MG tablet Take 10 mg by mouth daily.   09/17/2023 Morning   galantamine (RAZADYNE ER) 16  MG 24 hr capsule Take 16 mg by mouth daily with breakfast.   Morning   metoprolol  succinate (TOPROL -XL) 50 MG 24 hr tablet TAKE ONE TABLET AT BEDTIME WITH OR IMMEDIATELY FOLLOWING A MEAL 90 tablet 3 09/17/2023 at  9:00 PM   inclisiran (LEQVIO ) 284 MG/1.5ML SOSY injection Inject 1.5ml every 6 months 1.5 mL 0 More than a month   zolpidem  (AMBIEN ) 10 MG tablet Take 5-10 mg by mouth at bedtime as needed for sleep.   More than a month    Current Facility-Administered Medications  Medication Dose Route Frequency Provider Last Rate Last Admin   bupivacaine  liposome (EXPAREL ) 1.3 % injection 266 mg  20 mL Infiltration Once Sheldon Standing, MD       ceFAZolin  (ANCEF ) IVPB 2g/100 mL premix  2 g Intravenous On Call to OR Sheldon Standing, MD       Chlorhexidine  Gluconate Cloth 2 %  PADS 6 each  6 each Topical Once Alexanderia Gorby, Elspeth, MD       And   Chlorhexidine  Gluconate Cloth 2 % PADS 6 each  6 each Topical Once Dalissa Lovin, Elspeth, MD       dexamethasone  (DECADRON ) injection 4 mg  4 mg Intravenous On Call to OR Sheldon Elspeth, MD       lactated ringers  infusion   Intravenous Continuous Epifanio Fallow, MD 10 mL/hr at 09/18/23 0646 New Bag at 09/18/23 0646     No Known Allergies  BP 139/82   Pulse 62   Temp 97.8 F (36.6 C) (Oral)   Resp 16   Ht 5' 10 (1.778 m)   Wt 77.1 kg   SpO2 98%   BMI 24.39 kg/m   Labs: Results for orders placed or performed during the hospital encounter of 09/16/23 (from the past 48 hours)  Basic metabolic panel per protocol     Status: Abnormal   Collection Time: 09/16/23  9:43 AM  Result Value Ref Range   Sodium 138 135 - 145 mmol/L   Potassium 3.8 3.5 - 5.1 mmol/L   Chloride 104 98 - 111 mmol/L   CO2 26 22 - 32 mmol/L   Glucose, Bld 113 (H) 70 - 99 mg/dL    Comment: Glucose reference range applies only to samples taken after fasting for at least 8 hours.   BUN 23 8 - 23 mg/dL   Creatinine, Ser 8.74 (H) 0.61 - 1.24 mg/dL   Calcium  8.9 8.9 - 10.3 mg/dL   GFR, Estimated  57 (L) >60 mL/min    Comment: (NOTE) Calculated using the CKD-EPI Creatinine Equation (2021)    Anion gap 8 5 - 15    Comment: Performed at [facility], [address] [place], [place] [address]  CBC per protocol     Status: None   Collection Time: 09/16/23  9:43 AM  Result Value Ref Range   WBC 7.6 4.0 - 10.5 K/uL   RBC 4.63 4.22 - 5.81 MIL/uL   Hemoglobin 14.4 13.0 - 17.0 g/dL   HCT 56.8 60.9 - 47.9 %   MCV 93.1 80.0 - 100.0 fL   MCH 31.1 26.0 - 34.0 pg   MCHC 33.4 30.0 - 36.0 g/dL   RDW 87.0 88.4 - 84.4 %   Platelets 196 150 - 400 K/uL   nRBC 0.0 0.0 - 0.2 %    Comment: Performed at [facility], [address], [place], [place] [address]    Imaging / Studies: CUP PACEART REMOTE DEVICE CHECK Result Date: 09/17/2023 Pacemaker: Scheduled remote reviewed. Normal device function.  Presenting rhythm: AP/VS Next remote 91 days. ML, CVRS    .Elspeth KYM Sheldon, M.D., F.A.C.S. Gastrointestinal and Minimally Invasive Surgery [facility] [address] [place], [place] [address] [phone] Main / Paging  09/18/2023 7:08 AM    Elspeth JAYSON Sheldon

## 2023-09-18 NOTE — Transfer of Care (Signed)
 Immediate Anesthesia Transfer of Care Note  Patient: Alexander [name]  Procedure(s) Performed: REPAIR, HERNIA, INGUINAL, LAPAROSCOPIC (Left: Abdomen)  Patient Location: PACU  Anesthesia Type:General  Level of Consciousness: sedated and responds to stimulation  Airway & Oxygen Therapy: Patient Spontanous Breathing and Patient connected to face mask oxygen  Post-op Assessment: Report given to RN and Post -op Vital signs reviewed and stable  Post vital signs: Reviewed and stable  Last Vitals:  Vitals Value Taken Time  BP 147/74 09/18/23 09:55  Temp 36.8   Pulse 60 09/18/23 10:00  Resp 13 09/18/23 10:00  SpO2 100 % 09/18/23 10:00  Vitals shown include unfiled device data.  Last Pain:  Vitals:   09/18/23 0638  TempSrc:   PainSc: 0-No pain      Patients Stated Pain Goal: 5 (09/18/23 0630)  Complications: No notable events documented.

## 2023-09-18 NOTE — Anesthesia Postprocedure Evaluation (Signed)
 Anesthesia Post Note  Patient: Alexander [name]  Procedure(s) Performed: REPAIR, HERNIA, INGUINAL, LAPAROSCOPIC (Left: Abdomen)     Patient location during evaluation: PACU Anesthesia Type: General Level of consciousness: awake Pain management: pain level controlled Vital Signs Assessment: post-procedure vital signs reviewed and stable Respiratory status: spontaneous breathing, nonlabored ventilation and respiratory function stable Cardiovascular status: blood pressure returned to baseline and stable Postop Assessment: no apparent nausea or vomiting Anesthetic complications: no   No notable events documented.  Last Vitals:  Vitals:   09/18/23 1100 09/18/23 1115  BP: 130/76 134/77  Pulse: 64 (!) 59  Resp: 16   Temp: (!) 36.3 C   SpO2: 96% 100%    Last Pain:  Vitals:   09/18/23 1100  TempSrc:   PainSc: 0-No pain                 Delon Aisha Arch

## 2023-09-18 NOTE — Discharge Instructions (Signed)
 HERNIA REPAIR: POST OP INSTRUCTIONS  ######################################################################  EAT Gradually transition to a high fiber diet with a fiber supplement over the next few weeks after discharge.  Start with a pureed / full liquid diet (see below)  WALK Walk an hour a day.  Control your pain to do that.    CONTROL PAIN Control pain so that you can walk, sleep, tolerate sneezing/coughing, and go up/down stairs.  HAVE A BOWEL MOVEMENT DAILY Keep your bowels regular to avoid problems.  OK to try a laxative to override constipation.  OK to use an antidairrheal to slow down diarrhea.  Call if not better after 2 tries  CALL IF YOU HAVE PROBLEMS/CONCERNS Call if you are still struggling despite following these instructions. Call if you have concerns not answered by these instructions  ######################################################################    DIET: Follow a light bland diet & liquids the first 24 hours after arrival home, such as soup, liquids, starches, etc.  Be sure to drink plenty of fluids.  Quickly advance to a usual solid diet within a few days.  Avoid fast food or heavy meals as your are more likely to get nauseated or have irregular bowels.  A low-fat, high-fiber diet for the rest of your life is ideal.   Take your usually prescribed home medications unless otherwise directed.  PAIN CONTROL: Pain is best controlled by a usual combination of three different methods TOGETHER: Ice/Heat Over the counter pain medication Prescription pain medication Most patients will experience some swelling and bruising around the hernia(s) such as the bellybutton, groins, or old incisions.  Ice packs or heating pads (30-60 minutes up to 6 times a day) will help. Use ice for the first few days to help decrease swelling and bruising, then switch to heat to help relax tight/sore spots and speed recovery.  Some people prefer to use ice alone, heat alone, alternating  between ice & heat.  Experiment to what works for you.  Swelling and bruising can take several weeks to resolve.   It is helpful to take an over-the-counter pain medication regularly for the first few weeks.  Choose one of the following that works best for you: Naproxen (Aleve, etc)  Two 220mg  tabs twice a day Ibuprofen (Advil, etc) Three 200mg  tabs four times a day (every meal & bedtime) Acetaminophen  (Tylenol , etc) 325-650mg  four times a day (every meal & bedtime) A  prescription for pain medication should be given to you upon discharge.  Take your pain medication as prescribed.  If you are having problems/concerns with the prescription medicine (does not control pain, nausea, vomiting, rash, itching, etc), please call us  [phone] to see if we need to switch you to a different pain medicine that will work better for you and/or control your side effect better. If you need a refill on your pain medication, please contact your pharmacy.  They will contact our office to request authorization. Prescriptions will not be filled after 5 pm or on week-ends.  AVOID GETTING CONSTIPATED.   Between the surgery and the pain medications, it is common to experience some constipation.  Drink plenty of liquids Take a fiber supplement 2 times day (such as Metamucil, Citrucel, FiberCon, MiraLax , etc) to have a bowel movement every day. If you have not had a BM by 2 days after surgery: -drink liquids only until you have a bowel movement -take MiraLAX  2 doses every 2 hours until you have a bowel movement   Wash / shower every day.  You may  shower over the dressings as they are waterproof.    It is good for closed incisions and even open wounds to be washed every day.  Shower every day.  Short baths are fine.  Wash the incisions and wounds clean with soap & water .    You may leave closed incisions open to air if it is dry.   You may cover the incision with clean gauze & replace it after your daily shower for  comfort.  TEGADERM:  You have clear gauze band-aid dressings over your closed incision(s).  Remove the dressings 2 days after surgery = 7/13 Sunday.    ACTIVITIES as tolerated:   You may resume regular (light) daily activities beginning the next day--such as daily self-care, walking, climbing stairs--gradually increasing activities as tolerated.  Control your pain so that you can walk an hour a day.  If you can walk 30 minutes without difficulty, it is safe to try more intense activity such as jogging, treadmill, bicycling, low-impact aerobics, swimming, etc. Save the most intensive and strenuous activity for last such as sit-ups, heavy lifting, contact sports, etc  Refrain from any heavy lifting or straining until you are off narcotics for pain control.   DO NOT PUSH THROUGH PAIN.  Let pain be your guide: If it hurts to do something, don't do it.  Pain is your body warning you to avoid that activity for another week until the pain goes down. You may drive when you are no longer taking prescription pain medication, you can comfortably wear a seatbelt, and you can safely maneuver your car and apply brakes. You may have sexual intercourse when it is comfortable.   FOLLOW UP in our office Please call [facility] at [phone] to set up an appointment to see your surgeon in the office for a follow-up appointment approximately 2-3 weeks after your surgery. Make sure that you call for this appointment the day you arrive home to insure a convenient appointment time.  9.  If you have disability of FMLA / Family leave forms, please bring the forms to the office for processing.  (do not give to your surgeon).  WHEN TO CALL US  [phone]: Poor pain control Reactions / problems with new medications (rash/itching, nausea, etc)  Fever over 101.5 F (38.5 C) Inability to urinate Nausea and/or vomiting Worsening swelling or bruising Continued bleeding from incision. Increased pain, redness, or drainage  from the incision   The clinic staff is available to answer your questions during regular business hours (8:30am-5pm).  Please don't hesitate to call and ask to speak to one of our nurses for clinical concerns.   If you have a medical emergency, go to the nearest emergency room or call 911.  A surgeon from [facility] is always on call at the hospitals in [facility], [place] [address], [place], [place]  [address] ?  [address] [address], [place], [place]   [address] MAIN: [phone] ? TOLL FREE: [phone] ? FAX: [phone] [email or web address]

## 2023-09-18 NOTE — H&P (Signed)
 09/18/2023    PATIENT NAME: Alexander [name] MRN: [id number] DOB: Feb 20, 1941 PHYSICIANS:  REFERRING PHYSICIAN: Fleeta Rothman, Cornelius Nest*  CARE TEAM:  Patient Care Team: Shayne Oneil LABOR, MD as PCP - General (Internal Medicine) Raford Annabella SQUIBB, MD as Resident (Cardiovascular Disease) [name], MD as Referring Physician (Cardiac Electrophysiology)  CONSULTING PROVIDER: ELSPETH JUDAH SCHULTZE, MD  SUBJECTIVE   Chief Complaint: Hernia   Alexander [name] is a 83 y.o. male  who is seen today as an office consultation  at the request of DrRONITA Fleeta Rothman  for evaluation of inguinal hernia  History of Present Illness:  83 year old male. Retired Catering manager. History of mitral valve repair 2021. Some memory loss. Seen by neurology. 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 Discussion about therapies through his neurologist Dr. Margaret. He is followed by cardiology with a pacemaker. Supposed to be taking 81 mg aspirin  but I do not know if he is compliant on that looking at records. He says he is taking it right now. Golfs regularly. Drives. Rather physically active. Exercises in the gym for an hour several times a week. Noticed some groin swelling and inguinal hernia suspected. Surgical consultation requested.  Patient comes in today by himself. He wants me to call his wife after the visit. He notes he has had some bulging in his left groin that for the past few months. It is gotten larger. Not causing severe pain but worried about the increase in size and recalls having a right inguinal hernia repair in the past and wishes to be aggressive. Dr. Mikell did a laparoscopic TEP repair 11/02/2015 for a large right indirect inguinal hernia.   His wife is due to get hip surgery in August by Dr. Melodi, so they were hoping to get this fixed as soon as possible, so he can recover and help take care of her. He does not smoke. No  diabetes. Denies any other abdominal surgery. No problems with urination. No nocturia or difficulty starting stream. Does not recall urologist. Not on any prostate meds.  Medical History:  Past Medical History:  Diagnosis Date  Heart valve disease   Patient Active Problem List  Diagnosis  Left inguinal hernia  Hx of hernia repair  Memory loss  Cardiac pacemaker in situ   Past Surgical History:  Procedure Laterality Date  ARTHROPLASTY TOTAL KNEE  INSERT / REPLACE / REMOVE PACEMAKER  REPAIR MITRAL VALVE    No Known Allergies  Current Outpatient Medications on File Prior to Visit  Medication Sig Dispense Refill  ezetimibe (ZETIA) 10 mg tablet Take 10 mg by mouth once daily  galantamine (RAZADYNE ER) 16 MG ER capsule TAKE ONE CAPSULE BY MOUTH EVERY MORNING WITH FOOD  inclisiran (LEQVIO ) 284 mg/1.5 mL injection syringe Inject 1.5ml every 6 months  metoprolol  SUCCinate (TOPROL -XL) 50 MG XL tablet TAKE ONE TABLET AT BEDTIME WITH OR IMMEDIATELY FOLLOWING A MEAL  zolpidem  (AMBIEN ) 10 mg tablet Take 10 mg by mouth once daily as needed  acetaminophen  (TYLENOL ) 500 MG tablet Take 500 mg by mouth every 6 (six) hours (Patient not taking: Reported on 09/07/2023)  aspirin  81 MG EC tablet Take 81 mg by mouth once daily (Patient not taking: Reported on 09/07/2023)  rosuvastatin  (CRESTOR ) 5 MG tablet Take 5 mg by mouth once daily (Patient not taking: Reported on 09/07/2023)   No current facility-administered medications on file prior to visit.   History reviewed. No pertinent family history.   Social History  Tobacco Use  Smoking Status Never  Smokeless Tobacco Never    Social History   Socioeconomic History  Marital status: Married  Tobacco Use  Smoking status: Never  Smokeless tobacco: Never  Substance and Sexual Activity  Alcohol  use: Never  Drug use: Never   Social Drivers of Health   Food Insecurity: Low Risk (04/20/2023)  Received from [facility]  Hunger Vital Sign   Within the past 12 months, you worried that your food would run out before you got money to buy more: Never true  Within the past 12 months, the food you bought just didn't last and you didn't have money to get more. : Never true  Transportation Needs: No Transportation Needs (04/20/2023)  Received from [employer]  In the past 12 months, has lack of reliable transportation kept you from medical appointments, meetings, work or from getting things needed for daily living? : No  Housing Stability: Unknown (09/07/2023)  Housing Stability Vital Sign  Homeless in the Last Year: No   ############################################################  Review of Systems: A complete review of systems (ROS) was obtained from the patient.  We have reviewed this information and discussed as appropriate with the patient.  See HPI as well for other pertinent ROS.  Constitutional: No fevers, chills, sweats. Weight stable Eyes: No vision changes, No discharge HENT: No sore throats, nasal drainage Lymph: No neck swelling, No bruising easily Pulmonary: No cough, productive sputum CV: No orthopnea, PND . No exertional chest/neck/shoulder/arm pain. Patient can walk 2-3 miles without difficulty.   GI: No personal nor family history of GI/colon cancer, inflammatory bowel disease, irritable bowel syndrome, allergy such as Celiac Sprue, dietary/dairy problems, colitis, ulcers nor gastritis. No recent sick contacts/gastroenteritis. No travel outside the country. No changes in diet.  Renal: No UTIs, No hematuria Genital: No drainage, bleeding, masses Musculoskeletal: No severe joint pain. Good ROM major joints Skin: No sores or lesions Heme/Lymph: No easy bleeding. No swollen lymph nodes Neuro: No active seizures. No facial droop Psych: No hallucinations. No agitation  OBJECTIVE   Vitals:  09/07/23 1140  BP: (!) 140/82  Pulse: 70  Weight: 76.7 kg (169 lb)  Height: 177.8 cm (5' 10)    Body mass index is 24.25 kg/m.  PHYSICAL EXAM:  Constitutional: Not cachectic. Hygeine adequate. Vitals signs as above.  Eyes: No glasses. Vision adequate,Pupils reactive, normal extraocular movements. Sclera nonicteric Neuro: CN II-XII intact. No major focal sensory defects. No major motor deficits. Lymph: No head/neck/groin lymphadenopathy Psych: No severe agitation. No severe anxiety. Judgment & insight Adequate, Oriented x4, HENT: Normocephalic, Mucus membranes moist. No thrush. Hearing: adequate Neck: Supple, No tracheal deviation. No obvious thyromegaly Chest: No pain to chest wall compression. Good respiratory excursion. No audible wheezing CV: Pulses intact. regular. No major extremity edema Ext: No obvious deformity or contracture. Edema: Not present. No cyanosis Skin: No major subcutaneous nodules. Warm and dry Musculoskeletal: Severe joint rigidity not present. No obvious clubbing. No digital petechiae. Mobility: no assist device moving easily without restrictions  Abdomen: Flat Soft. Nondistended. Nontender. Hernia: Not present. Diastasis recti: Not present. No hepatomegaly. No splenomegaly.  Genital/Pelvic: Inguinal hernia: Obvious left groin bulging consistent with moderate hernia reducible. Mild impulse on the right side. Inguinal lymph nodes: without lymphadenopathy nor hidradenitis.   Rectal: (Deferred)    ###################################################################  Labs, Imaging and Diagnostic Testing:  Located in 'Care Everywhere' section of Epic EMR chart  PRIOR [facility] CLINIC NOTES:  Not applicable  SURGERY NOTES:  Not applicable  PATHOLOGY:  Not applicable  Assessment and Plan:  DIAGNOSES:  Diagnoses and all orders for this visit:  Left inguinal hernia  Hx of hernia repair  Memory loss  Cardiac pacemaker in situ    ASSESSMENT/PLAN  Elderly but very healthy active male with obvious left inguinal hernia. Questional history of  prior right inguinal hernia repair.  Think he would benefit from surgery to fix this. Reasonable for a laparoscopic approach. Sure there is no recurrence on the right side. Would be nice to find the operative report from the prior hernia repair.  The anatomy & physiology of the abdominal wall and pelvic floor was discussed. The pathophysiology of hernias in the inguinal and pelvic region was discussed. Natural history risks such as progressive enlargement, pain, incarceration, and strangulation was discussed. Contributors to complications such as smoking, obesity, diabetes, prior surgery, etc were discussed.   I feel the risks of no intervention will lead to serious problems that outweigh the operative risks; therefore, I recommended surgery to reduce and repair the hernia. I explained laparoscopic techniques with possible need for an open approach. I noted usual use of mesh to patch and/or buttress hernia repair  Risks such as bleeding, infection, abscess, need for further treatment, injury to other organs, need for repair of tissues / organs, stroke, heart attack, death, and other risks were discussed. I noted a good likelihood this will help address the problem. Goals of post-operative recovery were discussed as well. Possibility that this will not correct all symptoms was explained. I stressed the importance of low-impact activity, aggressive pain control, avoiding constipation, & not pushing through pain to minimize risk of post-operative chronic pain or injury. Possibility of reherniation was discussed. We will work to minimize complications.   An educational handout further explaining the pathology & treatment options was given as well. Questions were answered. The patient expresses understanding & wishes to proceed with surgery.  He has great performance status. I think he would tolerate surgery rather well. Double check with cardiology that okay to lock pacemaker no other concerns given his  prior cardiac surgery. Just saw Dr. Raford. Most likely just a quick note. Okay to stay on 81 mg aspirin  perioperativelyfrom my standpoint. I do not know if he has been compliant on that, but will defer to the patient and cardiology  Some mild memory loss but seems quite functional active. Try and do short surgery to minimize any concerns there  The patient's wishes, I called and discussed with his wife as well: Explained the diagnosis & lap intervention. She is really hoping this can get done as soon as possible so he can recover enough to help her with her hip surgery. She is worried about delaying his inguinal hernia after her hip surgery. I suspect most likely would be safe but reasonable to try and see if we can fit it in soon as long as cleared by cardiology which I hope will be the case. Questions answered. She was appreciative.  Elspeth KYM Schultze, MD, FACS, MASCRS Esophageal, Gastrointestinal & Colorectal Surgery Robotic and Minimally Invasive Surgery  [facility] [address] [place], [place] [address] [phone] Fax [phone] Main  CONTACT INFORMATION: Weekday (9AM-5PM): Call [facility] main office at [phone] Weeknight (5PM-9AM) or Weekend/Holiday: Check EPIC Web Links tab & use AMION (password  [email or web address]) for General Surgery [facility] coverage  Please, DO NOT use SecureChat  (it is not reliable communication to reach operating surgeons &  will lead to a delay in care).   Epic staff messaging available for outptient concerns needing 1-2 business day response.

## 2023-09-18 NOTE — Anesthesia Procedure Notes (Signed)
 Procedure Name: Intubation Date/Time: 09/18/2023 7:35 AM  Performed by: Obadiah Reyes BROCKS, CRNAPre-anesthesia Checklist: Patient identified, Emergency Drugs available, Suction available and Patient being monitored Patient Re-evaluated:Patient Re-evaluated prior to induction Oxygen Delivery Method: Circle System Utilized Preoxygenation: Pre-oxygenation with 100% oxygen Induction Type: IV induction Ventilation: Mask ventilation without difficulty Laryngoscope Size: Miller and 2 Grade View: Grade I Tube type: Oral Number of attempts: 1 Airway Equipment and Method: Stylet and Oral airway Placement Confirmation: ETT inserted through vocal cords under direct vision, positive ETCO2 and breath sounds checked- equal and bilateral Secured at: 23 cm Tube secured with: Tape Dental Injury: Teeth and Oropharynx as per pre-operative assessment

## 2023-09-18 NOTE — Op Note (Signed)
 09/18/2023  9:45 AM  PATIENT:  Alexander [name]  83 y.o. male  Patient Care Team: [name], MD as PCP - General (Internal Medicine) Sheldon Standing, MD as Consulting Physician (General Surgery) Margaret Eduard SAUNDERS, MD as Consulting Physician (Neurology) [name], MD as Consulting Physician (Clinical Cardiac Electrophysiology) Raford Riggs, MD as Attending Physician (Cardiology) Fleeta Rothman, Jomarie SAILOR, MD as Consulting Physician (Infectious Diseases)  PRE-OPERATIVE DIAGNOSIS:  LEFT AND POSSIBLE RIGHT INGUINAL HERNIA REPAIRS  POST-OPERATIVE DIAGNOSIS:   LEFT INGUINAL HERNIA RIGHT FEMORAL HERNIA  PROCEDURE:   LAPAROSCOPIC LEFT INGUINAL HERNIA REPAIR LAPAROSCOPIC RIGHT FEMORAL HERNIA REPAIR LAPAROSCOPIC LYSIS OF ADHESIONS X 45 MIN (50% CASE) TRANSVERSUS ABDOMINIS PLANE (TAP) BLOCK - BILATERAL  SURGEON:  Standing KYM Sheldon, MD  ASSISTANT:  (n/a)   ANESTHESIA:  General endotracheal intubation anesthesia (GETA) and Regional TRANSVERSUS ABDOMINIS PLANE (TAP) nerve block -BILATERAL for perioperative & postoperative pain control at the level of the transverse abdominis & preperitoneal spaces along the flank at the anterior axillary line, from subcostal ridge to iliac crest under laparoscopic guidance provided with liposomal bupivacaine  (Experel) 20mL mixed with 60 mL of bupivicaine 0.25% with epinephrine   Estimated Blood Loss (EBL):   No intake/output data recorded..   (See anesthesia record)  Delay start of Pharmacological VTE agent (>24hrs) due to concerns of significant anemia, surgical blood loss, or risk of bleeding?:  no  DRAINS: (None)  SPECIMEN:  (no specimen)  DISPOSITION OF SPECIMEN:  (not applicable)  COUNTS:  Sponge, needle, & instrument counts CORRECT at the conclusion of the case.      PLAN OF CARE: Discharge to home after PACU  PATIENT DISPOSITION:  PACU - hemodynamically stable.  INDICATION: Active male.  Distant history of repair of right  inguinal hernia.  Abdominal bulging on the left side concerning for left one hernia.  More subtle impulse on the right raising suspicions of possible recurrence versus new hernia.  I recommended laparoscopic exploration repair of hernias found.  Call discussed patient's wife.  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  The anatomy & physiology of the abdominal wall and pelvic floor was discussed.  The pathophysiology of hernias in the inguinal and pelvic region was discussed.  Natural history risks such as progressive enlargement, pain, incarceration & strangulation was discussed.   Contributors to complications such as smoking, obesity, diabetes, prior surgery, etc were discussed.    I feel the risks of no intervention will lead to serious problems that outweigh the operative risks; therefore, I recommended surgery to reduce and repair the hernia.  I explained laparoscopic techniques with possible need for an open approach.  I noted usual use of mesh to patch and/or buttress hernia repair  Risks such as bleeding, infection, abscess, need for further treatment, heart attack, death, and other risks were discussed.  I noted a good likelihood this will help address the problem.   Goals of post-operative recovery were discussed as well.  Possibility that this will not correct all symptoms was explained.  I stressed the importance of low-impact activity, aggressive pain control, avoiding constipation, & not pushing through pain to minimize risk of post-operative chronic pain or injury. Possibility of reherniation was discussed.  We will work to minimize complications.     An educational handout further explaining the pathology & treatment options was given as well.  Questions were answered.  The patient expresses understanding & wishes to proceed with surgery.  OR FINDINGS: Moderate indirect left inguinal hernia.  No direct space  femoral nor obturator hernias.  On the right side intact preperitoneal mesh with no recurrent direct/indirect inguinal hernias.  However there was evidence of a small developing femoral hernia.  No obturator hernia.  DESCRIPTION:  The patient was identified & brought into the operating room. The patient was positioned supine with arms tucked. SCDs were active during the entire case. The patient underwent general anesthesia without any difficulty.  The abdomen was prepped and draped in a sterile fashion. The patient's bladder was emptied.  A Surgical Timeout confirmed our plan.  I made a transverse incision through the inferior umbilical fold.  I made a small transverse nick through the anterior rectus fascia contralateral to the inguinal hernia side and placed a 0-vicryl stitch through the fascia.  I placed a Hasson trocar into the preperitoneal plane.  Entry was clean.  We induced carbon dioxide insufflation. Camera inspection revealed no injury.  I used a 10mm angled scope to bluntly free the peritoneum off the infraumbilical anterior abdominal wall.  I created enough of a preperitoneal pocket to place 5mm ports into the right & left mid-abdomen into this preperitoneal cavity.  This took some time to safely free peritoneum off obvious right preperitoneal mesh.  It did carry somewhat over to the midline.  I did end up directing a camera into the true peritoneal cavity.  Confirmed left indirect inguinal hernia.  No evidence of any recurrent right inguinal hernia but the mesh had creeped up anteriorly and there is evidence of an early femoral hernia on that side.  I focused attention on the LEFT pelvis since that was the dominant hernia side.   I used blunt & focused sharp dissection to free the peritoneum off the flank and down to the pubic rim.  I freed the anteriolateral bladder wall off the anteriolateral pelvic wall, sparing midline attachments.   I located a swath of peritoneum going into a hernia  fascial defect at the  internal ring consistent with  an indirect inguinal hernia..  I gradually freed the peritoneal hernia sac off safely and reduced it into the preperitoneal space.  I freed the peritoneum off the spermatic vessels & vas deferens.  I freed peritoneum off the retroperitoneum along the psoas muscle.  Inguinal canal cord lipoma was dissected away & removed.  I checked & assured hemostasis.     I turned attention on the opposite  RIGHT pelvis.  I did dissection in a similar, mirror-image fashion.  Took special care to free the peritoneum off the mesh.  Ended up focusing on creating a window between the peritoneum and the spermatic cord vessels and vas deferens to have good posterior dissection.  Confirmed a small but definite femoral hernia on that side.  No obturator hernia.  Confirmed no evidence of any recurrent inguinal hernia.  The patient had a femoral hernia.  Given the very dense adhesions of mesh the peritoneum I did have a breach in the peritoneal cavity on that side.  I closed that with absorbable 2 oh V-Loc suture in a running fashion.  I used that to incorporate the old hernia sac to help close that space as well.  I chose sheets of medium-weight polypropylene Bard Marlex 15x15cm, one for the left side.  I cut a single sigmoid-shaped slit ~6cm from a corner of each mesh.  I placed the meshes into the preperitoneal space & laid them as overlapping diamonds such that at the  inferior points, a 6x6 cm corner flap rested in the true anterolateral pelvis, covering the obturator & femoral foramina.   I allowed the bladder to return to the pubis, this helping tuck the corners of the mesh in the anteriolateral pelvis.  The medial corners overlapped each other across midline cephalad to the pubic rim.   Given the numerous hernias of moderate size, I placed another 15x15cm mesh in the center as a vertical diamond.  The lateral wings of the mesh overlap across the direct spaces and internal  rings where the dominant hernias were.  I focused the right lateral wing to have good overlap and repair of the femoral hernia as well as covering the obturator foramen.  This provided good overlap with the old Mesh as well.  Because of the central mesh placement with good overlap, I did not place any tacks.   I held the hernia sacs cephalad & evacuated carbon dioxide.  I closed the fascia with absorbable suture.  I closed the skin using 4-0 monocryl stitch.  Sterile dressings were applied.   The patient was extubated & arrived in the PACU in stable condition..  I had discussed postoperative care with the patient in the holding area.  Instructions are written in the chart.  I discussed operative findings, updated the patient's status, discussed probable steps to recovery, and gave postoperative recommendations to the patient's spouse, Deavon Podgorski.  Recommendations were made.  Questions were answered.  She expressed understanding & appreciation.   Elspeth KYM Schultze, M.D., F.A.C.S. Gastrointestinal and Minimally Invasive Surgery [facility] [address], [address] [place], [place] [address] [phone] Main / Paging  09/18/2023 9:45 AM

## 2023-09-21 ENCOUNTER — Encounter (HOSPITAL_COMMUNITY): Payer: Self-pay | Admitting: Surgery

## 2023-09-21 ENCOUNTER — Telehealth: Payer: Self-pay | Admitting: Adult Health

## 2023-09-21 NOTE — Addendum Note (Signed)
 Addendum  created 09/21/23 1101 by Peggye Delon Brunswick, MD   Intraprocedure Staff edited

## 2023-09-21 NOTE — Telephone Encounter (Signed)
 Sent to [facility] since he has a pacemaker. [phone]

## 2023-09-22 ENCOUNTER — Telehealth (HOSPITAL_COMMUNITY): Payer: Self-pay | Admitting: Pharmacy Technician

## 2023-09-22 NOTE — Telephone Encounter (Signed)
 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 Med will be covered at 100%.   Dajanee Voorheis, CPhT [facility] [phone]

## 2023-09-24 ENCOUNTER — Ambulatory Visit: Payer: Self-pay | Admitting: Cardiology

## 2023-09-24 NOTE — CV Procedure (Signed)
  Device system confirmed to be MRI conditional, with implant date > 6 weeks ago, and no evidence of abandoned or epicardial leads in review of most recent CXR  Device last cleared by EP Provider: Prentice Passey 09/24/23  Clearance is good through for 1 year as long as parameters remain stable at time of check. If pt undergoes a cardiac device procedure during that time, they should be re-cleared.   Tachy-therapies to be programmed off if applicable with device back to pre-MRI settings after completion of exam.  [employer] - Programming recommendation received through [employer] App/Tablet  Izetta CHRISTELLA Linen, RT  09/24/2023 10:43 AM

## 2023-09-29 ENCOUNTER — Ambulatory Visit (HOSPITAL_COMMUNITY)
Admission: RE | Admit: 2023-09-29 | Discharge: 2023-09-29 | Disposition: A | Discharge status: Home / Self Care | Source: Ambulatory Visit | Attending: Adult Health | Admitting: Adult Health

## 2023-09-29 DIAGNOSIS — G3184 Mild cognitive impairment, so stated: Secondary | ICD-10-CM | POA: Diagnosis not present

## 2023-09-29 DIAGNOSIS — G309 Alzheimer's disease, unspecified: Secondary | ICD-10-CM | POA: Diagnosis not present

## 2023-09-29 DIAGNOSIS — Z79899 Other long term (current) drug therapy: Secondary | ICD-10-CM | POA: Insufficient documentation

## 2023-10-05 ENCOUNTER — Telehealth: Payer: Self-pay | Admitting: Diagnostic Neuroimaging

## 2023-10-05 NOTE — Telephone Encounter (Signed)
 Pt wife called to request a callback about Pt  MRI results camp back and Pt and wife wants to know if they can get a callback to see if Pt can move forward with infusion and see when they can get appt .

## 2023-10-06 ENCOUNTER — Ambulatory Visit: Payer: Self-pay | Admitting: Adult Health

## 2023-10-06 NOTE — Telephone Encounter (Signed)
 Spoke w/Pt wife regarding MRI results. Informed Pt wife we do not have the result notes yet and Harlene has requested Dr. Margaret read the results. Wife stated her biggest concern is that they want to start the infusions back as soon as possible, if at all possible. Informed wife will send message to Dr. Margaret making him aware. Wife voiced understanding and thanks for the call back.

## 2023-10-06 NOTE — Telephone Encounter (Signed)
 Just FYI, pt stopped by 10/06/23 at 4:30 to just say he is still waiting for a call regarding results. He wants to know where he stands with his infusions.

## 2023-10-07 NOTE — Telephone Encounter (Signed)
 Discussed further with Dr. Margaret. He is requesting scheduling a virtual visit to discuss restarting infusions and further discuss risk vs benefit. Can he please be scheduled for a VV with Dr. Margaret at next available? Thank you!

## 2023-10-08 ENCOUNTER — Telehealth: Admitting: Diagnostic Neuroimaging

## 2023-10-08 ENCOUNTER — Encounter: Payer: Self-pay | Admitting: Diagnostic Neuroimaging

## 2023-10-08 DIAGNOSIS — G3184 Mild cognitive impairment, so stated: Secondary | ICD-10-CM | POA: Diagnosis not present

## 2023-10-08 DIAGNOSIS — G309 Alzheimer's disease, unspecified: Secondary | ICD-10-CM

## 2023-10-08 NOTE — Telephone Encounter (Signed)
 1 new single punctate infarct noted.  ARIA findings are stable. Hemorrhages stable. Edema is resolved.  Please setup video visit to discuss restarting kisunla. Will follow slower titration protocol if we decide to restart.     EDUARD FABIENE HANLON, MD 10/08/2023, 11:09 AM Certified in Neurology, Neurophysiology and Neuroimaging  [facility] [address], [address] [place], [place] [address] [phone]

## 2023-10-08 NOTE — Patient Instructions (Signed)
-   restart kisunla (modified gradual protocol)

## 2023-10-08 NOTE — Telephone Encounter (Signed)
 Called the wife. I was able to advise of the MRI findings and advise that we will hopefully be able to get the pt back started on Kisunla. Informed that Dr Margaret would like to complete a video visit to discuss this further and what that process will look like.  She had never done a virtual visit before so I talked her through how to complete the visit.

## 2023-10-08 NOTE — Progress Notes (Signed)
 [facility]  PATIENT: Alexander [name] DOB: [birth year]  REFERRING CLINICIAN: [name], MD HISTORY FROM: patient and wife REASON FOR VISIT: FOLLOW UP   HISTORICAL  CHIEF COMPLAINT:  Chief Complaint  Patient presents with   Memory Loss    HISTORY OF PRESENT ILLNESS:   UPDATE (10/08/23, VRP): since last visit, started donanemab georgana) n Feb 2025. Was halted in April due to ARIA h and ARIA e. Then monitored for stability, but some small infarcts (incidental). Now stable. Ready to restart.   UPDATE (02/16/2023, VRP): Since last visit, doing about the same.  Continues with some mild forgetfulness issues.  Wife notes more problems.  He is able to maintain most of his ADLs.  However she cannot rely on him to follow-up on to do list tasks like previously.  Testing has been completed.  Here to discuss amyloid targeting antibiotic therapy.  PRIOR HPI: 83 year old male, retired Investment banker, operational, here for evaluation of memory loss.  Past 6 to 12 months patient has had onset of mild short-term memory loss, having difficulty remembering names, appointments and conversations.   Had an incident for his recent primary care office visit where he showed up at the wrong time.  This is unusual for him.  Otherwise he is able to maintain most of his own ADLs.  He is still driving, playing golf on a regular basis, able to be on his own.  However wife is noted that he is starting to become a little bit less reliable when she asks him to do tasks.  They are trying to use reminders, smart phone alarms and other compensation techniques.  At PCP visit on 12/01/2022, concern for possible mild dementia was raised due to family history in his mother.  He was started on galantamine and had phosphorylated tau testing done but not resulted yet.    REVIEW OF SYSTEMS: Full 14 system review of systems performed and negative with exception of: as per HPI.  ALLERGIES: No Known  Allergies  HOME MEDICATIONS: Outpatient Medications Prior to Visit  Medication Sig Dispense Refill   aspirin  EC 81 MG tablet Take 81 mg by mouth daily. Swallow whole.     ezetimibe (ZETIA) 10 MG tablet Take 10 mg by mouth daily.     galantamine (RAZADYNE ER) 16 MG 24 hr capsule Take 16 mg by mouth daily with breakfast.     inclisiran (LEQVIO ) 284 MG/1.5ML SOSY injection Inject 1.5ml every 6 months 1.5 mL 0   metoprolol  succinate (TOPROL -XL) 50 MG 24 hr tablet TAKE ONE TABLET AT BEDTIME WITH OR IMMEDIATELY FOLLOWING A MEAL 90 tablet 3   traMADol  (ULTRAM ) 50 MG tablet Take 1-2 tablets (50-100 mg total) by mouth every 6 (six) hours as needed for moderate pain (pain score 4-6) or severe pain (pain score 7-10). 20 tablet 0   zolpidem  (AMBIEN ) 10 MG tablet Take 5-10 mg by mouth at bedtime as needed for sleep.     No facility-administered medications prior to visit.    PAST MEDICAL HISTORY: Past Medical History:  Diagnosis Date   Alzheimer disease (HCC)    Arthritis    Asthma    Heart murmur    Hyperlipidemia    Hypertension    Mitral regurgitation    MVP (mitral valve prolapse)    OSA (obstructive sleep apnea)    AHI of 6.78/hr and O2 sats as low as 84%.  His OSA was moderate during REM sleep at 15.4/hr and 10.6/hr during supine sleep  Presence of permanent cardiac pacemaker    Snoring 09/07/2019   Syncope     PAST SURGICAL HISTORY: Past Surgical History:  Procedure Laterality Date   BUBBLE STUDY  05/23/2019   Procedure: BUBBLE STUDY;  Surgeon: [name], MD;  Location[facility] ENDOSCOPY;  Service: Cardiovascular;;   CATARACT EXTRACTION, BILATERAL Bilateral    HERNIA REPAIR     INGUINAL HERNIA REPAIR Left 09/18/2023   Procedure: REPAIR, HERNIA, INGUINAL, LAPAROSCOPIC;  Surgeon: Sheldon Standing, MD;  Location: [facility] ORS;  Service: General;  Laterality: Left;  LAPAROSCOPIC LEFT INGUINAL AND RIGHT FEMORAL HERNIA REPAIRS   MITRAL VALVE REPAIR Right 06/14/2019   Procedure: MINIMALLY  INVASIVE MITRAL VALVE REPAIR (MVR)<TEE;  Surgeon: [name], MD;  Location: [facility] OR;  Service: Open Heart Surgery;  Laterality: Right;   PACEMAKER IMPLANT N/A 10/12/2017   Procedure: PACEMAKER IMPLANT;  Surgeon: [name], MD;  Location: [facility] INVASIVE CV LAB;  Service: Cardiovascular;  Laterality: N/A;   RIGHT/LEFT HEART CATH AND CORONARY ANGIOGRAPHY N/A 06/06/2019   Procedure: RIGHT/LEFT HEART CATH AND CORONARY ANGIOGRAPHY;  Surgeon: [name], MD;  Location: [facility] INVASIVE CV LAB;  Service: Cardiovascular;  Laterality: N/A;   TEE WITHOUT CARDIOVERSION N/A 05/23/2019   Procedure: TRANSESOPHAGEAL ECHOCARDIOGRAM (TEE);  Surgeon: [name], MD;  Location: [facility] ENDOSCOPY;  Service: Cardiovascular;  Laterality: N/A;   TEE WITHOUT CARDIOVERSION N/A 06/14/2019   Procedure: TRANSESOPHAGEAL ECHOCARDIOGRAM (TEE);  Surgeon: [name], MD;  Location: [facility] OR;  Service: Open Heart Surgery;  Laterality: N/A;   TONSILECTOMY, ADENOIDECTOMY, BILATERAL MYRINGOTOMY AND TUBES     as child   TONSILLECTOMY     TOTAL KNEE ARTHROPLASTY Left    TOTAL KNEE ARTHROPLASTY Left 02/23/2019   Procedure: TOTAL KNEE ARTHROPLASTY;  Surgeon: [name], MD;  Location: [facility] ORS;  Service: Orthopedics;  Laterality: Left;     FAMILY HISTORY: Family History  Problem Relation Age of Onset   Alzheimer's disease Mother    Hypertension Father     SOCIAL HISTORY: Social History   Socioeconomic History   Marital status: Married    Spouse name: Not on file   Number of children: Not on file   Years of education: Not on file   Highest education level: Not on file  Occupational History   Not on file  Tobacco Use   Smoking status: Never    Passive exposure: Never   Smokeless tobacco: Never  Vaping Use   Vaping status: Never Used  Substance and Sexual Activity   Alcohol  use: Not Currently    Alcohol /week: 5.0 standard drinks of alcohol     Types: 5 Glasses of wine per week    Drug use: No   Sexual activity: Not on file  Other Topics Concern   Not on file  Social History Narrative   Pt lives with wife    Retired    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 : Never true  Transportation Needs: No Transportation Needs (04/20/2023)   Received from [facility]    In the past 12  months, has lack of reliable transportation kept you from medical appointments, meetings, work or from getting things needed for daily living? : No  Physical Activity: Not on file  Stress: Not on file  Social Connections: Not on file  Intimate Partner Violence: Not on file     PHYSICAL EXAM  Video visit  GENERAL EXAM/CONSTITUTIONAL: Vitals:  There were no vitals filed for this visit.  There is no height or weight on file to calculate BMI. Wt Readings from Last 3 Encounters:  09/18/23 170 lb (77.1 kg)  09/17/23 170 lb (77.1 kg)  09/16/23 167 lb (75.8 kg)   Patient is in no distress; well developed, nourished and groomed; neck is supple  CARDIOVASCULAR: Examination of carotid arteries is normal; no carotid bruits Regular rate and rhythm, no murmurs Examination of peripheral vascular system by observation and palpation is normal  EYES: Ophthalmoscopic exam of optic discs and posterior segments is normal; no papilledema or hemorrhages No results found.  MUSCULOSKELETAL: Gait, strength, tone, movements noted in Neurologic exam below  NEUROLOGIC: MENTAL STATUS:     09/17/2023   10:13 AM 04/08/2023   10:10 AM 12/03/2022    4:24 PM  MMSE - Mini Mental State Exam  Orientation to time 4 2 5   Orientation to Place 4 4 5    Registration 3 3 3   Attention/ Calculation 1 4 5   Recall 1 2 1   Language- name 2 objects 2 2 2   Language- repeat 1 1 1   Language- follow 3 step command 3 3 3   Language- read & follow direction 1 1 0  Write a sentence 1 1 1   Copy design 1 0 1  Total score 22 23 27    awake, alert, oriented to person, place and time recent and remote memory intact normal attention and concentration language fluent, comprehension intact, naming intact fund of knowledge appropriate  CRANIAL NERVE:  2nd - no papilledema on fundoscopic exam 2nd, 3rd, 4th, 6th - pupils equal and reactive to light, visual fields full to confrontation, extraocular muscles intact, no nystagmus 5th - facial sensation symmetric 7th - facial strength symmetric 8th - hearing intact 9th - palate elevates symmetrically, uvula midline 11th - shoulder shrug symmetric 12th - tongue protrusion midline  MOTOR:  normal bulk and tone, full strength in the BUE, BLE  SENSORY:  normal and symmetric to light touch, temperature, vibration  COORDINATION:  finger-nose-finger, fine finger movements normal  REFLEXES:  deep tendon reflexes present and symmetric  GAIT/STATION:  narrow based gait     DIAGNOSTIC DATA (LABS, IMAGING, TESTING) - I reviewed patient records, labs, notes, testing and imaging myself where available.  Lab Results  Component Value Date   WBC 7.6 09/16/2023   HGB 14.4 09/16/2023   HCT 43.1 09/16/2023   MCV 93.1 09/16/2023   PLT 196 09/16/2023      Component Value Date/Time   NA 138 09/16/2023 0943   NA 141 05/19/2019 1354   K 3.8 09/16/2023 0943   CL 104 09/16/2023 0943   CO2 26 09/16/2023 0943   GLUCOSE 113 (H) 09/16/2023 0943   BUN 23 09/16/2023 0943   BUN 20 05/19/2019 1354   CREATININE 1.25 (H) 09/16/2023 0943   CALCIUM  8.9 09/16/2023 0943   PROT 6.3 (L) 06/10/2019 1225   PROT 6.2 02/10/2019 1429   ALBUMIN  3.9 06/10/2019 1225   ALBUMIN  4.5 02/10/2019 1429   AST 26 06/10/2019 1225    ALT 18 06/10/2019 1225   ALKPHOS 67 06/10/2019 1225  BILITOT 0.4 06/10/2019 1225   BILITOT 0.5 02/10/2019 1429   GFRNONAA 57 (L) 09/16/2023 0943   GFRAA >60 06/19/2019 0332   Lab Results  Component Value Date   CHOL 169 02/10/2019   HDL 81 02/10/2019   LDLCALC 73 02/10/2019   TRIG 80 02/10/2019   CHOLHDL 2.1 02/10/2019   Lab Results  Component Value Date   HGBA1C 5.3 06/10/2019   Lab Results  Component Value Date   VITAMINB12 523 02/09/2023   Lab Results  Component Value Date   TSH 1.730 02/09/2023   02/09/23     Component Ref Range & Units 7 d ago  A -- Beta-amyloid 42/40 Ratio >0.102 0.106  Beta-amyloid 42 pg/mL 19.30  Beta-amyloid 40 pg/mL 181.72  T -- p-tau181 0.00 - 0.97 pg/mL 1.01 High   N -- NfL, Plasma 0.00 - 11.55 pg/mL 2.76  ATN SUMMARY Comment  Comment:                        A- T+ N- A high pTau181 concentration was observed. A normal beta-amyloid 42/40 ratio and normal concentration of NfL were observed at this time. These results are not consistent with the presence of Alzheimer's- related pathology, but may indicate pathology of another condition. Additional assessments may be necessary. These tests are intended to be used only in the context of clinical care.   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  12/16/22 MRI brain [I reviewed images myself and agree with interpretation. Total of 3 chronic cerebral microhemorrhages. -VRP]  - 2 mm focus of susceptibility-weighted signal loss within the anterior right frontal lobe, likely reflecting a chronic microhemorrhage. Additional punctate chronic microhemorrhages along the medial aspect of the left caudate nucleus and within the right cerebellar hemisphere. 1. 4 mm lesion  within the posterior aspect of the pituitary gland, as described. This may reflect a cyst or pituitary microadenoma. No further imaging evaluation or imaging follow-up is necessary. Correlate with history of pituitary hypersecretion. This follows ACR consensus guidelines: Management of Incidental Pituitary Findings on CT, MRI and F18-FDG PET: A White Paper of the ACR Incidental Findings Committee. J Am Coll Radiol 2018; 15: 966-72. 2. Mild chronic small ischemic changes within the cerebral white matter. 3. Mild generalized cerebral atrophy.   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   09/29/23 MRI 1. Mild temporal lobe and hippocampal atrophy 2. Microhemorrhages are unchanged 3. No areas of edema 4. Punctate acute lacunar infarct in the right parietal cortex. The punctate occipital lobe infarcts noted on the prior study have resolved   ASSESSMENT AND PLAN  83 y.o. year old male here with mild progressive short-term memory loss, recall difficulty, in last 6 to 12 months, with MMSE 27 out of 30.  However cognitive changes have somewhat rapidly changed according to wife and other family members, especially compared to his prior high functioning level.  Dx:  1. 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   Virtual Visit via Video Note  I connected with Alexander [name] on 10/08/2023 at  2:00 PM EDT by a video enabled telemedicine application and verified that I am speaking with the correct person using two identifiers.   I discussed the limitations of evaluation and management by telemedicine and the availability of in person appointments. The patient expressed understanding and agreed to proceed.  Patient is at home and I am at the office.   I spent 25 minutes of face-to-face and non-face-to-face time with patient.  This included previsit chart review, lab review, study review, order entry, electronic health record documentation, patient education.        Alexander FABIENE HANLON, MD 10/08/2023, 2:45 PM Certified in Neurology, Neurophysiology and Neuroimaging  [facility] [address], [address] [place], [place] [address] [phone]

## 2023-10-14 ENCOUNTER — Telehealth: Payer: Self-pay | Admitting: Diagnostic Neuroimaging

## 2023-10-14 NOTE — Telephone Encounter (Signed)
 Cld Pt wife regarding her inquiry about the infusions restarting. Informed wife the [facility] had sent for insurance approval and are awaiting the response and he will start after that. Pt wife very upset and was unable to understand why the process was started over. Attempted to explain that the loading dose had changed and Pt wife has not been infused in several months so the process had to start again. Pt wife had questions for [facility], so message was sent to them making them aware of wife's questions and concerns. Holly with [facility] took over call with Pt wife to attempt to answer questions.

## 2023-10-14 NOTE — Telephone Encounter (Signed)
 Patient's wife, Tajee Savant; would like a call back to discuss when patient will start next infusion. His infusion is a week over due.

## 2023-10-21 ENCOUNTER — Telehealth: Payer: Self-pay

## 2023-10-21 DIAGNOSIS — G309 Alzheimer's disease, unspecified: Secondary | ICD-10-CM

## 2023-10-21 DIAGNOSIS — R413 Other amnesia: Secondary | ICD-10-CM

## 2023-10-21 DIAGNOSIS — H6123 Impacted cerumen, bilateral: Secondary | ICD-10-CM | POA: Diagnosis not present

## 2023-10-21 DIAGNOSIS — G3184 Mild cognitive impairment, so stated: Secondary | ICD-10-CM

## 2023-10-21 DIAGNOSIS — Z006 Encounter for examination for normal comparison and control in clinical research program: Secondary | ICD-10-CM | POA: Diagnosis not present

## 2023-10-21 DIAGNOSIS — Z79899 Other long term (current) drug therapy: Secondary | ICD-10-CM

## 2023-10-21 DIAGNOSIS — I63432 Cerebral infarction due to embolism of left posterior cerebral artery: Secondary | ICD-10-CM

## 2023-10-21 NOTE — Telephone Encounter (Signed)
 MRI ordered for Pt to continue getting infused Kinsunla. Must be completed and read by 11/18/23 per [facility].

## 2023-10-30 NOTE — CV Procedure (Signed)
  Device system confirmed to be MRI conditional, with implant date > 6 weeks ago, and no evidence of abandoned or epicardial leads in review of most recent CXR  Device last cleared by EP Provider: Prentice Passey   Clearance is good through for 1 year as long as parameters remain stable at time of check. If pt undergoes a cardiac device procedure during that time, they should be re-cleared.   Tachy-therapies to be programmed off if applicable with device back to pre-MRI settings after completion of exam.  [employer] - Programming recommendation received through [employer] App/Tablet  [employer], RT  10/30/2023 8:05 AM

## 2023-11-03 ENCOUNTER — Ambulatory Visit (HOSPITAL_COMMUNITY)
Admission: RE | Admit: 2023-11-03 | Discharge: 2023-11-03 | Disposition: A | Discharge status: Home / Self Care | Source: Ambulatory Visit | Attending: Diagnostic Neuroimaging | Admitting: Diagnostic Neuroimaging

## 2023-11-13 DIAGNOSIS — I2510 Atherosclerotic heart disease of native coronary artery without angina pectoris: Secondary | ICD-10-CM | POA: Insufficient documentation

## 2023-11-13 NOTE — CV Procedure (Signed)
  Device system confirmed to be MRI conditional, with implant date > 6 weeks ago, and no evidence of abandoned or epicardial leads in review of most recent CXR  Device last cleared by EP Provider: Suzann Riddle 11/13/23  Clearance is good through for 1 year as long as parameters remain stable at time of check. If pt undergoes a cardiac device procedure during that time, they should be re-cleared.   Tachy-therapies to be programmed off if applicable with device back to pre-MRI settings after completion of exam.  [employer] - Programming recommendation received through [employer] App/Tablet  Rocky Catalan, RT  11/13/2023 9:43 AM

## 2023-11-16 ENCOUNTER — Encounter (HOSPITAL_COMMUNITY)

## 2023-11-17 ENCOUNTER — Ambulatory Visit (HOSPITAL_COMMUNITY)
Admission: RE | Admit: 2023-11-17 | Discharge: 2023-11-17 | Disposition: A | Discharge status: Home / Self Care | Source: Ambulatory Visit | Attending: Diagnostic Neuroimaging | Admitting: Diagnostic Neuroimaging

## 2023-11-17 ENCOUNTER — Telehealth: Payer: Self-pay | Admitting: Diagnostic Neuroimaging

## 2023-11-17 DIAGNOSIS — G3184 Mild cognitive impairment, so stated: Secondary | ICD-10-CM | POA: Insufficient documentation

## 2023-11-17 DIAGNOSIS — I63432 Cerebral infarction due to embolism of left posterior cerebral artery: Secondary | ICD-10-CM | POA: Insufficient documentation

## 2023-11-17 DIAGNOSIS — G309 Alzheimer's disease, unspecified: Secondary | ICD-10-CM | POA: Insufficient documentation

## 2023-11-17 DIAGNOSIS — R9089 Other abnormal findings on diagnostic imaging of central nervous system: Secondary | ICD-10-CM | POA: Diagnosis not present

## 2023-11-17 DIAGNOSIS — R413 Other amnesia: Secondary | ICD-10-CM | POA: Insufficient documentation

## 2023-11-17 DIAGNOSIS — Z79899 Other long term (current) drug therapy: Secondary | ICD-10-CM | POA: Diagnosis present

## 2023-11-17 DIAGNOSIS — I639 Cerebral infarction, unspecified: Secondary | ICD-10-CM | POA: Diagnosis not present

## 2023-11-17 NOTE — Telephone Encounter (Signed)
 Pt came into office, states he got his MRI today at the hospital. Would like orders in to go ahead and schedule his next MRI. States at the hospital they are 4 weeks behind on scheduling he would like to schedule his MRI needed one month from now, so he is not delayed in the future. Pt states he would like a nurse to call him to explain.   [phone]

## 2023-11-17 NOTE — Telephone Encounter (Signed)
 Pt called to  requesting call back from nurse about MRI

## 2023-11-17 NOTE — Progress Notes (Signed)
 Patient was monitored by this RN during MRI scan due to presence of a pacemaker. Cardiac rhythm was continuously monitored throughout the procedure. Prior to the start of the scan, the pacemaker was placed in MRI-safe mode by the MRI technician and/or pacemaker representative. Following the completion of the scan, the device was returned to its pre-MRI settings. Neurological status and orientation post-procedure were unchanged from baseline.   Pre-procedure Heart Rate (Prior to being placed in MRI safe mode):  60  Post-procedure Heart Rate (Once pacemaker is returned to baseline mode):  60

## 2023-11-17 NOTE — Telephone Encounter (Signed)
 I technically can not place a new order until Dr Margaret  reviews the MRI from today okaying the next infusion to be given. Once he reviews the MRI result then I can go ahead and put the order in for the next MRI to be completed between 9/15-10/15 time frame (next infusion scheduled 9/15)

## 2023-11-18 ENCOUNTER — Ambulatory Visit: Payer: Self-pay | Admitting: Diagnostic Neuroimaging

## 2023-11-18 ENCOUNTER — Inpatient Hospital Stay (HOSPITAL_COMMUNITY): Admission: RE | Admit: 2023-11-18 | Source: Ambulatory Visit

## 2023-11-19 ENCOUNTER — Other Ambulatory Visit: Payer: Self-pay | Admitting: Neurology

## 2023-11-19 DIAGNOSIS — R413 Other amnesia: Secondary | ICD-10-CM

## 2023-11-19 DIAGNOSIS — I63432 Cerebral infarction due to embolism of left posterior cerebral artery: Secondary | ICD-10-CM

## 2023-11-19 DIAGNOSIS — G309 Alzheimer's disease, unspecified: Secondary | ICD-10-CM

## 2023-11-19 DIAGNOSIS — Z79899 Other long term (current) drug therapy: Secondary | ICD-10-CM

## 2023-11-20 ENCOUNTER — Other Ambulatory Visit (HOSPITAL_BASED_OUTPATIENT_CLINIC_OR_DEPARTMENT_OTHER): Payer: Self-pay

## 2023-11-20 DIAGNOSIS — Z23 Encounter for immunization: Secondary | ICD-10-CM | POA: Diagnosis not present

## 2023-11-20 MED ORDER — FLUZONE HIGH-DOSE 0.5 ML IM SUSY
0.5000 mL | PREFILLED_SYRINGE | Freq: Once | INTRAMUSCULAR | 0 refills | Status: AC
  Filled 2023-11-20: qty 0.5, 1d supply, fill #0

## 2023-11-23 DIAGNOSIS — Z006 Encounter for examination for normal comparison and control in clinical research program: Secondary | ICD-10-CM | POA: Diagnosis not present

## 2023-11-23 DIAGNOSIS — G3184 Mild cognitive impairment, so stated: Secondary | ICD-10-CM | POA: Diagnosis not present

## 2023-11-24 ENCOUNTER — Inpatient Hospital Stay (HOSPITAL_COMMUNITY): Admission: RE | Admit: 2023-11-24 | Source: Ambulatory Visit

## 2023-11-24 ENCOUNTER — Ambulatory Visit (HOSPITAL_COMMUNITY)
Admission: RE | Admit: 2023-11-24 | Discharge: 2023-11-24 | Disposition: A | Discharge status: Home / Self Care | Source: Ambulatory Visit | Attending: Internal Medicine | Admitting: Internal Medicine

## 2023-11-24 VITALS — BP 127/81 | HR 82 | Temp 97.1°F | Resp 16

## 2023-11-24 DIAGNOSIS — E7849 Other hyperlipidemia: Secondary | ICD-10-CM | POA: Insufficient documentation

## 2023-11-24 DIAGNOSIS — I2510 Atherosclerotic heart disease of native coronary artery without angina pectoris: Secondary | ICD-10-CM | POA: Insufficient documentation

## 2023-11-24 MED ORDER — INCLISIRAN SODIUM 284 MG/1.5ML ~~LOC~~ SOSY
PREFILLED_SYRINGE | SUBCUTANEOUS | Status: AC
  Filled 2023-11-24: qty 1.5

## 2023-11-24 MED ORDER — INCLISIRAN SODIUM 284 MG/1.5ML ~~LOC~~ SOSY
284.0000 mg | PREFILLED_SYRINGE | Freq: Once | SUBCUTANEOUS | Status: AC
  Administered 2023-11-24: 284 mg via SUBCUTANEOUS

## 2023-11-27 NOTE — CV Procedure (Signed)
  Device system confirmed to be MRI conditional, with implant date > 6 weeks ago, and no evidence of abandoned or epicardial leads in review of most recent CXR  Device last cleared by EP Provider: Prentice Passey 11/26/23  Clearance is good through for 1 year as long as parameters remain stable at time of check. If pt undergoes a cardiac device procedure during that time, they should be re-cleared.   Tachy-therapies to be programmed off if applicable with device back to pre-MRI settings after completion of exam.  [employer] - Programming recommendation received through [employer] App/Tablet  Rocky Catalan, RT  11/27/2023 9:37 AM

## 2023-11-30 DIAGNOSIS — Z23 Encounter for immunization: Secondary | ICD-10-CM | POA: Diagnosis not present

## 2023-12-01 ENCOUNTER — Ambulatory Visit (INDEPENDENT_AMBULATORY_CARE_PROVIDER_SITE_OTHER): Payer: Medicare Other | Admitting: Diagnostic Neuroimaging

## 2023-12-01 ENCOUNTER — Encounter: Payer: Self-pay | Admitting: Diagnostic Neuroimaging

## 2023-12-01 ENCOUNTER — Ambulatory Visit (HOSPITAL_COMMUNITY)
Admission: RE | Admit: 2023-12-01 | Discharge: 2023-12-01 | Disposition: A | Discharge status: Home / Self Care | Source: Ambulatory Visit | Attending: Diagnostic Neuroimaging | Admitting: Diagnostic Neuroimaging

## 2023-12-01 VITALS — BP 108/67 | HR 78 | Ht 70.0 in | Wt 173.0 lb

## 2023-12-01 DIAGNOSIS — I63432 Cerebral infarction due to embolism of left posterior cerebral artery: Secondary | ICD-10-CM | POA: Insufficient documentation

## 2023-12-01 DIAGNOSIS — G309 Alzheimer's disease, unspecified: Secondary | ICD-10-CM | POA: Diagnosis not present

## 2023-12-01 DIAGNOSIS — Z79899 Other long term (current) drug therapy: Secondary | ICD-10-CM | POA: Diagnosis present

## 2023-12-01 DIAGNOSIS — G3184 Mild cognitive impairment, so stated: Secondary | ICD-10-CM

## 2023-12-01 DIAGNOSIS — R413 Other amnesia: Secondary | ICD-10-CM | POA: Diagnosis present

## 2023-12-01 DIAGNOSIS — G319 Degenerative disease of nervous system, unspecified: Secondary | ICD-10-CM | POA: Diagnosis not present

## 2023-12-01 NOTE — Progress Notes (Signed)
 [facility]  PATIENT: Alexander [name] DOB: 1940-08-19  REFERRING CLINICIAN: [name], MD HISTORY FROM: patient and wife REASON FOR VISIT: FOLLOW UP   HISTORICAL  CHIEF COMPLAINT:  Chief Complaint  Patient presents with   Memory Loss    Rm 6 with spouse Pt is well and stable. Reports no new concerns or worsening symptoms since last visit.     HISTORY OF PRESENT ILLNESS:   UPDATE (12/01/23, VRP): Since last visit, doing well. Doing well on kisunla  since he restarted. Memory issues are stable.  UPDATE (10/08/23, VRP): since last visit, started donanemab (kisunla ) on Feb 2025. Was halted in April due to ARIA h and ARIA e. Then monitored for stability, but some small infarcts (incidental). Now stable. Ready to restart.   UPDATE (02/16/2023, VRP): Since last visit, doing about the same.  Continues with some mild forgetfulness issues.  Wife notes more problems.  He is able to maintain most of his ADLs.  However she cannot rely on him to follow-up on to do list tasks like previously.  Testing has been completed.  Here to discuss amyloid targeting antibiotic therapy.  PRIOR HPI: 83 year old male, retired Investment banker, operational, here for evaluation of memory loss.  Past 6 to 12 months patient has had onset of mild short-term memory loss, having difficulty remembering names, appointments and conversations.   Had an incident for his recent primary care office visit where he showed up at the wrong time.  This is unusual for him.  Otherwise he is able to maintain most of his own ADLs.  He is still driving, playing golf on a regular basis, able to be on his own.  However wife is noted that he is starting to become a little bit less reliable when she asks him to do tasks.  They are trying to use reminders, smart phone alarms and other compensation techniques.  At PCP visit on 12/01/2022, concern for possible mild dementia was raised due to family history in his mother.  He was  started on galantamine and had phosphorylated tau testing done but not resulted yet.    REVIEW OF SYSTEMS: Full 14 system review of systems performed and negative with exception of: as per HPI.  ALLERGIES: No Known Allergies  HOME MEDICATIONS: Outpatient Medications Prior to Visit  Medication Sig Dispense Refill   aspirin  EC 81 MG tablet Take 81 mg by mouth daily. Swallow whole.     ezetimibe (ZETIA) 10 MG tablet Take 10 mg by mouth daily.     galantamine (RAZADYNE ER) 16 MG 24 hr capsule Take 16 mg by mouth daily with breakfast.     inclisiran (LEQVIO ) 284 MG/1.5ML SOSY injection Inject 1.5ml every 6 months 1.5 mL 0   metoprolol  succinate (TOPROL -XL) 50 MG 24 hr tablet TAKE ONE TABLET AT BEDTIME WITH OR IMMEDIATELY FOLLOWING A MEAL 90 tablet 3   traMADol  (ULTRAM ) 50 MG tablet Take 1-2 tablets (50-100 mg total) by mouth every 6 (six) hours as needed for moderate pain (pain score 4-6) or severe pain (pain score 7-10). 20 tablet 0   zolpidem  (AMBIEN ) 10 MG tablet Take 5-10 mg by mouth at bedtime as needed for sleep.     No facility-administered medications prior to visit.    PAST MEDICAL HISTORY: Past Medical History:  Diagnosis Date   Alzheimer disease (HCC)    Arthritis    Asthma    Heart murmur    Hyperlipidemia    Hypertension    Mitral regurgitation  MVP (mitral valve prolapse)    OSA (obstructive sleep apnea)    AHI of 6.78/hr and O2 sats as low as 84%.  His OSA was moderate during REM sleep at 15.4/hr and 10.6/hr during supine sleep   Presence of permanent cardiac pacemaker    Snoring 09/07/2019   Syncope     PAST SURGICAL HISTORY: Past Surgical History:  Procedure Laterality Date   BUBBLE STUDY  05/23/2019   Procedure: BUBBLE STUDY;  Surgeon: [name], MD;  Location: [facility] ENDOSCOPY;  Service: Cardiovascular;;   CATARACT EXTRACTION, BILATERAL Bilateral    HERNIA REPAIR     INGUINAL HERNIA REPAIR Left 09/18/2023   Procedure: REPAIR, HERNIA, INGUINAL,  LAPAROSCOPIC;  Surgeon: Sheldon Standing, MD;  Location: [facility] ORS;  Service: General;  Laterality: Left;  LAPAROSCOPIC LEFT INGUINAL AND RIGHT FEMORAL HERNIA REPAIRS   MITRAL VALVE REPAIR Right 06/14/2019   Procedure: MINIMALLY INVASIVE MITRAL VALVE REPAIR (MVR)<TEE;  Surgeon: [name], MD;  Location[facility];  Service: Open Heart Surgery;  Laterality: Right;   PACEMAKER IMPLANT N/A 10/12/2017   Procedure: PACEMAKER IMPLANT;  Surgeon: [name], MD;  Location: [facility] INVASIVE CV LAB;  Service: Cardiovascular;  Laterality: N/A;   RIGHT/LEFT HEART CATH AND CORONARY ANGIOGRAPHY N/A 06/06/2019   Procedure: RIGHT/LEFT HEART CATH AND CORONARY ANGIOGRAPHY;  Surgeon: [name], MD;  Location: [facility] INVASIVE CV LAB;  Service: Cardiovascular;  Laterality: N/A;   TEE WITHOUT CARDIOVERSION N/A 05/23/2019   Procedure: TRANSESOPHAGEAL ECHOCARDIOGRAM (TEE);  Surgeon: [name], MD;  Location: [facility] ENDOSCOPY;  Service: Cardiovascular;  Laterality: N/A;   TEE WITHOUT CARDIOVERSION N/A 06/14/2019   Procedure: TRANSESOPHAGEAL ECHOCARDIOGRAM (TEE);  Surgeon: [name], MD;  Location: [facility] OR;  Service: Open Heart Surgery;  Laterality: N/A;   TONSILECTOMY, ADENOIDECTOMY, BILATERAL MYRINGOTOMY AND TUBES     as child   TONSILLECTOMY     TOTAL KNEE ARTHROPLASTY Left    TOTAL KNEE ARTHROPLASTY Left 02/23/2019   Procedure: TOTAL KNEE ARTHROPLASTY;  Surgeon: [name], MD;  Location: [facility] ORS;  Service: Orthopedics;  Laterality: Left;     FAMILY HISTORY: Family History  Problem Relation Age of Onset   Alzheimer's disease Mother    Hypertension Father     SOCIAL HISTORY: Social History   Socioeconomic History   Marital status: Married    Spouse name: Not on file   Number of children: Not on file   Years of education: Not on file   Highest education level: Not on file  Occupational History   Not on file  Tobacco Use   Smoking status: Never    Passive exposure: Never    Smokeless tobacco: Never  Vaping Use   Vaping status: Never Used  Substance and Sexual Activity   Alcohol  use: Not Currently    Alcohol /week: 5.0 standard drinks of alcohol     Types: 5 Glasses of wine per week   Drug use: No   Sexual activity: Not on file  Other Topics Concern   Not on file  Social History Narrative   Pt lives with wife    Retired    Social Drivers of Corporate investment banker Strain: Not on file  Food Insecurity: Low Risk  (04/20/2023)   Received from [facility]   Hunger Vital Sign    Within the past 12 months, you worried that your food would run out before you got money to buy more: Never true    Within the past 12 months,  the food you bought just didn't last and you didn't have money to get more. : Never true  Transportation Needs: No Transportation Needs (04/20/2023)   Received from [facility]    In the past 12 months, has lack of reliable transportation kept you from medical appointments, meetings, work or from getting things needed for daily living? : No  Physical Activity: Not on file  Stress: Not on file  Social Connections: Not on file  Intimate Partner Violence: Not on file     PHYSICAL EXAM  Video visit  GENERAL EXAM/CONSTITUTIONAL: Vitals:  Vitals:   12/01/23 1523  BP: 108/67  Pulse: 78  Weight: 173 lb (78.5 kg)  Height: 5' 10 (1.778 m)    Body mass index is 24.82 kg/m. Wt Readings from Last 3 Encounters:  12/01/23 173 lb (78.5 kg)  09/18/23 170 lb (77.1 kg)  09/17/23 170 lb (77.1 kg)   Patient is in no distress; well developed, nourished and groomed; neck is supple  CARDIOVASCULAR: Examination of carotid arteries is normal; no carotid bruits Regular rate and rhythm, no murmurs Examination of peripheral vascular system by observation and palpation is normal  EYES: Ophthalmoscopic exam of optic discs and posterior segments is normal; no papilledema or hemorrhages No results  found.  MUSCULOSKELETAL: Gait, strength, tone, movements noted in Neurologic exam below  NEUROLOGIC: MENTAL STATUS:     09/17/2023   10:13 AM 04/08/2023   10:10 AM 12/03/2022    4:24 PM  MMSE - Mini Mental State Exam  Orientation to time 4 2 5   Orientation to Place 4 4 5   Registration 3 3 3   Attention/ Calculation 1 4 5   Recall 1 2 1   Language- name 2 objects 2 2 2   Language- repeat 1 1 1   Language- follow 3 step command 3 3 3   Language- read & follow direction 1 1 0  Write a sentence 1 1 1   Copy design 1 0 1  Total score 22 23 27    awake, alert, oriented to person, place and time recent and remote memory intact normal attention and concentration language fluent, comprehension intact, naming intact fund of knowledge appropriate  CRANIAL NERVE:  2nd - no papilledema on fundoscopic exam 2nd, 3rd, 4th, 6th - pupils equal and reactive to light, visual fields full to confrontation, extraocular muscles intact, no nystagmus 5th - facial sensation symmetric 7th - facial strength symmetric 8th - hearing intact 9th - palate elevates symmetrically, uvula midline 11th - shoulder shrug symmetric 12th - tongue protrusion midline  MOTOR:  normal bulk and tone, full strength in the BUE, BLE  SENSORY:  normal and symmetric to light touch, temperature, vibration  COORDINATION:  finger-nose-finger, fine finger movements normal  REFLEXES:  deep tendon reflexes present and symmetric  GAIT/STATION:  narrow based gait     DIAGNOSTIC DATA (LABS, IMAGING, TESTING) - I reviewed patient records, labs, notes, testing and imaging myself where available.  Lab Results  Component Value Date   WBC 7.6 09/16/2023   HGB 14.4 09/16/2023   HCT 43.1 09/16/2023   MCV 93.1 09/16/2023   PLT 196 09/16/2023      Component Value Date/Time   NA 138 09/16/2023 0943   NA 141 05/19/2019 1354   K 3.8 09/16/2023 0943   CL 104 09/16/2023 0943   CO2 26 09/16/2023 0943   GLUCOSE 113 (H)  09/16/2023 0943   BUN 23 09/16/2023 0943   BUN 20 05/19/2019 1354   CREATININE 1.25 (H)  09/16/2023 0943   CALCIUM  8.9 09/16/2023 0943   PROT 6.3 (L) 06/10/2019 1225   PROT 6.2 02/10/2019 1429   ALBUMIN  3.9 06/10/2019 1225   ALBUMIN  4.5 02/10/2019 1429   AST 26 06/10/2019 1225   ALT 18 06/10/2019 1225   ALKPHOS 67 06/10/2019 1225   BILITOT 0.4 06/10/2019 1225   BILITOT 0.5 02/10/2019 1429   GFRNONAA 57 (L) 09/16/2023 0943   GFRAA >60 06/19/2019 0332   Lab Results  Component Value Date   CHOL 169 02/10/2019   HDL 81 02/10/2019   LDLCALC 73 02/10/2019   TRIG 80 02/10/2019   CHOLHDL 2.1 02/10/2019   Lab Results  Component Value Date   HGBA1C 5.3 06/10/2019   Lab Results  Component Value Date   VITAMINB12 523 02/09/2023   Lab Results  Component Value Date   TSH 1.730 02/09/2023   02/09/23     Component Ref Range & Units 7 d ago  A -- Beta-amyloid 42/40 Ratio >0.102 0.106  Beta-amyloid 42 pg/mL 19.30  Beta-amyloid 40 pg/mL 181.72  T -- p-tau181 0.00 - 0.97 pg/mL 1.01 High   N -- NfL, Plasma 0.00 - 11.55 pg/mL 2.76  ATN SUMMARY Comment  Comment:                        A- T+ N- A high pTau181 concentration was observed. A normal beta-amyloid 42/40 ratio and normal concentration of NfL were observed at this time. These results are not consistent with the presence of Alzheimer's- related pathology, but may indicate pathology of another condition. Additional assessments may be necessary. These tests are intended to be used only in the context of clinical care.   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  12/16/22 MRI brain [I reviewed images myself and agree with interpretation. Total of 3 chronic cerebral microhemorrhages. -VRP]  - 2 mm focus  of susceptibility-weighted signal loss within the anterior right frontal lobe, likely reflecting a chronic microhemorrhage. Additional punctate chronic microhemorrhages along the medial aspect of the left caudate nucleus and within the right cerebellar hemisphere. 1. 4 mm lesion within the posterior aspect of the pituitary gland, as described. This may reflect a cyst or pituitary microadenoma. No further imaging evaluation or imaging follow-up is necessary. Correlate with history of pituitary hypersecretion. This follows ACR consensus guidelines: Management of Incidental Pituitary Findings on CT, MRI and F18-FDG PET: A White Paper of the ACR Incidental Findings Committee. J Am Coll Radiol 2018; 15: 966-72. 2. Mild chronic small ischemic changes within the cerebral white matter. 3. Mild generalized cerebral atrophy.   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   09/29/23 MRI 1. Mild temporal lobe and hippocampal atrophy 2. Microhemorrhages are unchanged 3. No areas of edema 4. Punctate acute lacunar infarct in the right parietal cortex. The punctate occipital lobe infarcts noted on the prior study have resolved  11/17/23 MRI brain  1. No acute intracranial abnormality. 2. No parenchymal edema. 3. Stable chronic microhemorrhages. 4. 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 5. Similar appearance of volume loss and chronic white matter changes.   ASSESSMENT AND PLAN  83 y.o. year old male here with mild progressive short-term memory loss, recall difficulty, in last 6 to 20  months, with MMSE 27 out of 30.  However cognitive changes have somewhat rapidly changed according to wife and other family members, especially compared to his prior high functioning level.  Dx:  1. 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       EDUARD FABIENE HANLON, MD 12/01/2023, 4:56 PM Certified in Neurology, Neurophysiology and Neuroimaging  [facility] [address] [place], [place] [address] [phone]

## 2023-12-01 NOTE — Progress Notes (Signed)
 Patient was monitored by this RN during MRI scan due to the presence of a pacemaker. Cardiac rhythm was continuously monitored throughout the procedure. Prior to the start of the scan, the pacemaker was placed in MRI-safe mode by the MRI technician and/or pacemaker representative. Following completion of the scan, the device was returned to its pre-MRI settings. Neurological status and orientation post-procedure were unchanged from baseline.     Pre-procedure Heart Rate (Prior to being placed in MRI safe mode): 73  MRI Mode:  84  Post-procedure Heart Rate (Once pacemaker is returned to baseline mode): 68

## 2023-12-03 DIAGNOSIS — G3184 Mild cognitive impairment, so stated: Secondary | ICD-10-CM | POA: Diagnosis not present

## 2023-12-03 DIAGNOSIS — G309 Alzheimer's disease, unspecified: Secondary | ICD-10-CM | POA: Diagnosis not present

## 2023-12-03 DIAGNOSIS — Z1389 Encounter for screening for other disorder: Secondary | ICD-10-CM | POA: Diagnosis not present

## 2023-12-03 DIAGNOSIS — E7849 Other hyperlipidemia: Secondary | ICD-10-CM | POA: Diagnosis not present

## 2023-12-03 DIAGNOSIS — M8580 Other specified disorders of bone density and structure, unspecified site: Secondary | ICD-10-CM | POA: Diagnosis not present

## 2023-12-03 DIAGNOSIS — Z125 Encounter for screening for malignant neoplasm of prostate: Secondary | ICD-10-CM | POA: Diagnosis not present

## 2023-12-05 ENCOUNTER — Ambulatory Visit: Payer: Self-pay | Admitting: Diagnostic Neuroimaging

## 2023-12-07 NOTE — Addendum Note (Signed)
 Encounter addended by: Thayne Consuelo DEL on: 12/07/2023 9:25 AM  Actions taken: Imaging Exam ended

## 2023-12-09 DIAGNOSIS — E785 Hyperlipidemia, unspecified: Secondary | ICD-10-CM | POA: Diagnosis not present

## 2023-12-09 DIAGNOSIS — R82998 Other abnormal findings in urine: Secondary | ICD-10-CM | POA: Diagnosis not present

## 2023-12-09 DIAGNOSIS — I10 Essential (primary) hypertension: Secondary | ICD-10-CM | POA: Diagnosis not present

## 2023-12-10 DIAGNOSIS — Z950 Presence of cardiac pacemaker: Secondary | ICD-10-CM | POA: Diagnosis not present

## 2023-12-10 DIAGNOSIS — I700 Atherosclerosis of aorta: Secondary | ICD-10-CM | POA: Diagnosis not present

## 2023-12-10 DIAGNOSIS — R972 Elevated prostate specific antigen [PSA]: Secondary | ICD-10-CM | POA: Diagnosis not present

## 2023-12-10 DIAGNOSIS — I10 Essential (primary) hypertension: Secondary | ICD-10-CM | POA: Diagnosis not present

## 2023-12-10 DIAGNOSIS — G309 Alzheimer's disease, unspecified: Secondary | ICD-10-CM | POA: Diagnosis not present

## 2023-12-10 DIAGNOSIS — M8589 Other specified disorders of bone density and structure, multiple sites: Secondary | ICD-10-CM | POA: Diagnosis not present

## 2023-12-10 DIAGNOSIS — I2510 Atherosclerotic heart disease of native coronary artery without angina pectoris: Secondary | ICD-10-CM | POA: Diagnosis not present

## 2023-12-10 DIAGNOSIS — M8580 Other specified disorders of bone density and structure, unspecified site: Secondary | ICD-10-CM | POA: Diagnosis not present

## 2023-12-10 DIAGNOSIS — Z0000 Encounter for general adult medical examination without abnormal findings: Secondary | ICD-10-CM | POA: Diagnosis not present

## 2023-12-10 DIAGNOSIS — I4510 Unspecified right bundle-branch block: Secondary | ICD-10-CM | POA: Diagnosis not present

## 2023-12-10 DIAGNOSIS — I341 Nonrheumatic mitral (valve) prolapse: Secondary | ICD-10-CM | POA: Diagnosis not present

## 2023-12-10 DIAGNOSIS — F0280 Dementia in other diseases classified elsewhere without behavioral disturbance: Secondary | ICD-10-CM | POA: Diagnosis not present

## 2023-12-10 DIAGNOSIS — E785 Hyperlipidemia, unspecified: Secondary | ICD-10-CM | POA: Diagnosis not present

## 2023-12-15 ENCOUNTER — Ambulatory Visit: Payer: Medicare Other

## 2023-12-15 DIAGNOSIS — I495 Sick sinus syndrome: Secondary | ICD-10-CM

## 2023-12-17 DIAGNOSIS — L570 Actinic keratosis: Secondary | ICD-10-CM | POA: Diagnosis not present

## 2023-12-17 DIAGNOSIS — L821 Other seborrheic keratosis: Secondary | ICD-10-CM | POA: Diagnosis not present

## 2023-12-17 LAB — CUP PACEART REMOTE DEVICE CHECK
Battery Remaining Longevity: 93 mo
Battery Voltage: 2.98 V
Brady Statistic AP VP Percent: 0.03 %
Brady Statistic AP VS Percent: 77.54 %
Brady Statistic AS VP Percent: 0.01 %
Brady Statistic AS VS Percent: 22.42 %
Brady Statistic RA Percent Paced: 77.84 %
Brady Statistic RV Percent Paced: 0.04 %
Date Time Interrogation Session: 20251009124516
Implantable Lead Connection Status: 753985
Implantable Lead Connection Status: 753985
Implantable Lead Implant Date: 20190805
Implantable Lead Implant Date: 20190805
Implantable Lead Location: 753859
Implantable Lead Location: 753860
Implantable Lead Model: 5076
Implantable Lead Model: 5076
Implantable Pulse Generator Implant Date: 20190805
Lead Channel Impedance Value: 304 Ohm
Lead Channel Impedance Value: 323 Ohm
Lead Channel Impedance Value: 418 Ohm
Lead Channel Impedance Value: 437 Ohm
Lead Channel Pacing Threshold Amplitude: 0.75 V
Lead Channel Pacing Threshold Amplitude: 0.75 V
Lead Channel Pacing Threshold Pulse Width: 0.4 ms
Lead Channel Pacing Threshold Pulse Width: 0.4 ms
Lead Channel Sensing Intrinsic Amplitude: 4.375 mV
Lead Channel Sensing Intrinsic Amplitude: 4.375 mV
Lead Channel Sensing Intrinsic Amplitude: 5.125 mV
Lead Channel Sensing Intrinsic Amplitude: 5.125 mV
Lead Channel Setting Pacing Amplitude: 2 V
Lead Channel Setting Pacing Amplitude: 2.5 V
Lead Channel Setting Pacing Pulse Width: 0.4 ms
Lead Channel Setting Sensing Sensitivity: 1.2 mV
Zone Setting Status: 755011
Zone Setting Status: 755011

## 2023-12-18 DIAGNOSIS — H6121 Impacted cerumen, right ear: Secondary | ICD-10-CM | POA: Diagnosis not present

## 2023-12-18 NOTE — Progress Notes (Signed)
 Remote PPM Transmission

## 2023-12-20 ENCOUNTER — Ambulatory Visit: Payer: Self-pay | Admitting: Cardiology

## 2023-12-21 ENCOUNTER — Other Ambulatory Visit: Payer: Self-pay | Admitting: *Deleted

## 2023-12-21 DIAGNOSIS — I63432 Cerebral infarction due to embolism of left posterior cerebral artery: Secondary | ICD-10-CM

## 2023-12-21 DIAGNOSIS — G309 Alzheimer's disease, unspecified: Secondary | ICD-10-CM

## 2023-12-21 DIAGNOSIS — Z006 Encounter for examination for normal comparison and control in clinical research program: Secondary | ICD-10-CM | POA: Diagnosis not present

## 2023-12-21 DIAGNOSIS — R413 Other amnesia: Secondary | ICD-10-CM

## 2023-12-21 DIAGNOSIS — Z79899 Other long term (current) drug therapy: Secondary | ICD-10-CM

## 2023-12-21 DIAGNOSIS — G3184 Mild cognitive impairment, so stated: Secondary | ICD-10-CM | POA: Diagnosis not present

## 2023-12-21 NOTE — Progress Notes (Signed)
 Orders Placed This Encounter  Procedures   MR BRAIN WO CONTRAST   Alexander FABIENE HANLON, MD 12/21/2023, 3:57 PM Certified in Neurology, Neurophysiology and Neuroimaging  [facility] [address] [place], [place] [address] [phone]

## 2024-01-06 ENCOUNTER — Other Ambulatory Visit (HOSPITAL_BASED_OUTPATIENT_CLINIC_OR_DEPARTMENT_OTHER): Payer: Self-pay

## 2024-01-08 NOTE — CV Procedure (Signed)
  Device system confirmed to be MRI conditional, with implant date > 6 weeks ago, and no evidence of abandoned or epicardial leads in review of most recent CXR  Device last cleared by EP Provider: Suzann Riddle 01/08/24  Clearance is good through for 1 year as long as parameters remain stable at time of check. If pt undergoes a cardiac device procedure during that time, they should be re-cleared.   Tachy-therapies to be programmed off if applicable with device back to pre-MRI settings after completion of exam.  [employer] - Programming recommendation received through [employer] App/Tablet  Rocky Catalan, RT  01/08/2024 9:47 AM

## 2024-01-12 ENCOUNTER — Ambulatory Visit (HOSPITAL_COMMUNITY)
Admission: RE | Admit: 2024-01-12 | Discharge: 2024-01-12 | Disposition: A | Discharge status: Home / Self Care | Source: Ambulatory Visit | Attending: Diagnostic Neuroimaging | Admitting: Diagnostic Neuroimaging

## 2024-01-12 ENCOUNTER — Ambulatory Visit: Payer: Self-pay | Admitting: Diagnostic Neuroimaging

## 2024-01-12 DIAGNOSIS — F0280 Dementia in other diseases classified elsewhere without behavioral disturbance: Secondary | ICD-10-CM

## 2024-01-12 DIAGNOSIS — G3184 Mild cognitive impairment, so stated: Secondary | ICD-10-CM | POA: Diagnosis not present

## 2024-01-12 DIAGNOSIS — I63432 Cerebral infarction due to embolism of left posterior cerebral artery: Secondary | ICD-10-CM | POA: Insufficient documentation

## 2024-01-12 DIAGNOSIS — Z8673 Personal history of transient ischemic attack (TIA), and cerebral infarction without residual deficits: Secondary | ICD-10-CM | POA: Diagnosis not present

## 2024-01-12 DIAGNOSIS — Z79899 Other long term (current) drug therapy: Secondary | ICD-10-CM | POA: Insufficient documentation

## 2024-01-12 DIAGNOSIS — R413 Other amnesia: Secondary | ICD-10-CM | POA: Diagnosis present

## 2024-01-12 DIAGNOSIS — G309 Alzheimer's disease, unspecified: Secondary | ICD-10-CM | POA: Diagnosis not present

## 2024-01-12 NOTE — Progress Notes (Signed)
 Patient was monitored by this RN during MRI scan due to presence of a pacemaker. Cardiac rhythm was continuously monitored throughout the procedure. Prior to the start of the scan, the pacemaker was placed in MRI-safe mode by the MRI technician and/or pacemaker representative. Following the completion of the scan, the device was returned to its pre-MRI settings. Neurological status and orientation post-procedure were unchanged from baseline.   Pre-procedure Heart Rate (Prior to being placed in MRI safe mode): 62 Post-procedure Heart Rate (Once pacemaker is returned to baseline mode): 60

## 2024-01-13 NOTE — Progress Notes (Signed)
 I let Holly w/ [facility] know.

## 2024-01-13 NOTE — Telephone Encounter (Deleted)
 I let Holly w/ [facility] know.

## 2024-01-14 NOTE — Telephone Encounter (Signed)
-----   Message from Eduard JONELLE Hanlon sent at 01/12/2024  5:54 PM EST ----- MRI is stable. Ok to continue kisunla . -VRP ----- Message ----- From: Interface, Rad Results In Sent: 01/12/2024  10:38 AM EST To: Eduard JONELLE Hanlon, MD

## 2024-01-14 NOTE — Telephone Encounter (Signed)
 MRI is stable. Ok to continue kisunla . -VRP Written by Eduard JONELLE Hanlon, MD on 01/12/2024  5:54 PM EST Seen by patient Alexander [name] on 01/12/2024  8:23 PM

## 2024-01-18 DIAGNOSIS — Z006 Encounter for examination for normal comparison and control in clinical research program: Secondary | ICD-10-CM | POA: Diagnosis not present

## 2024-01-18 DIAGNOSIS — G3184 Mild cognitive impairment, so stated: Secondary | ICD-10-CM | POA: Diagnosis not present

## 2024-01-21 DIAGNOSIS — H6121 Impacted cerumen, right ear: Secondary | ICD-10-CM | POA: Diagnosis not present

## 2024-01-26 DIAGNOSIS — I10 Essential (primary) hypertension: Secondary | ICD-10-CM | POA: Diagnosis not present

## 2024-01-26 DIAGNOSIS — R0683 Snoring: Secondary | ICD-10-CM | POA: Diagnosis not present

## 2024-02-12 ENCOUNTER — Other Ambulatory Visit (HOSPITAL_BASED_OUTPATIENT_CLINIC_OR_DEPARTMENT_OTHER): Payer: Self-pay

## 2024-02-15 DIAGNOSIS — G3184 Mild cognitive impairment, so stated: Secondary | ICD-10-CM | POA: Diagnosis not present

## 2024-02-15 DIAGNOSIS — Z006 Encounter for examination for normal comparison and control in clinical research program: Secondary | ICD-10-CM | POA: Diagnosis not present

## 2024-02-22 ENCOUNTER — Ambulatory Visit: Admitting: Diagnostic Neuroimaging

## 2024-02-22 ENCOUNTER — Encounter: Payer: Self-pay | Admitting: Diagnostic Neuroimaging

## 2024-02-26 ENCOUNTER — Ambulatory Visit: Admitting: Diagnostic Neuroimaging

## 2024-02-26 ENCOUNTER — Encounter: Payer: Self-pay | Admitting: Diagnostic Neuroimaging

## 2024-02-26 VITALS — BP 106/62 | HR 63 | Ht 70.0 in | Wt 178.0 lb

## 2024-02-26 DIAGNOSIS — G309 Alzheimer's disease, unspecified: Secondary | ICD-10-CM | POA: Diagnosis not present

## 2024-02-26 DIAGNOSIS — G3184 Mild cognitive impairment, so stated: Secondary | ICD-10-CM

## 2024-02-26 MED ORDER — ASPIRIN 81 MG PO TBEC: 81.0000 mg | DELAYED_RELEASE_TABLET | Freq: Every day | ORAL | Status: AC

## 2024-02-26 NOTE — Patient Instructions (Signed)
 MCI due alzheimer's pathology - was on kisunla  (donanemab) therapy: started Feb 2025; halted after ARIA-e, ARIA-h on MRI 06/09/23; addition MRI showed stability, but interval punctate ischemic infarcts) --> now restarted on 10/21/23.  Restarted Kisunla  (at [facility] infusion center) 10/21/23 (#1) 11/23/23 (#2) 12/21/23 (#3) 01/18/24 (#4) 02/15/24 (#5)  - continue MRI monitoring (at hospital) before infusions #2, 3, 4, 7   - plan to repeat amyloid PET scan after infusion #12; then may consider to stop kisnula vs continuing until infusion #18  - caution with medications, finances, driving

## 2024-02-26 NOTE — Progress Notes (Signed)
 "  [facility]  PATIENT: Alexander [name] DOB: [birth year]  REFERRING CLINICIAN: [name], MD HISTORY FROM: patient and wife REASON FOR VISIT: FOLLOW UP   HISTORICAL  CHIEF COMPLAINT:  Chief Complaint  Patient presents with   RM 6    Here with wife for 3 month memory follow-up on Kisunla .     HISTORY OF PRESENT ILLNESS:   UPDATE (02/26/24, VRP): Since last visit, doing well. Symptoms are stable. Severity is mild. No alleviating or aggravating factors. Tolerating kisunla  infusions (has completed 5 infusions since restarting on 10/21/23). Wife still concerned about future prognosis and planning for the future.    UPDATE (12/01/23, VRP): Since last visit, doing well. Doing well on kisunla  since he restarted. Memory issues are stable.  UPDATE (10/08/23, VRP): since last visit, started donanemab (kisunla ) on Feb 2025. Was halted in April due to ARIA h and ARIA e. Then monitored for stability, but some small infarcts (incidental). Now stable. Ready to restart.   UPDATE (02/16/2023, VRP): Since last visit, doing about the same.  Continues with some mild forgetfulness issues.  Wife notes more problems.  He is able to maintain most of his ADLs.  However she cannot rely on him to follow-up on to do list tasks like previously.  Testing has been completed.  Here to discuss amyloid targeting antibiotic therapy.  PRIOR HPI: 83 year old male, retired investment banker, operational, here for evaluation of memory loss.  Past 6 to 12 months patient has had onset of mild short-term memory loss, having difficulty remembering names, appointments and conversations.   Had an incident for his recent primary care office visit where he showed up at the wrong time.  This is unusual for him.  Otherwise he is able to maintain most of his own ADLs.  He is still driving, playing golf on a regular basis, able to be on his own.  However wife is noted that he is starting to become a little bit less reliable  when she asks him to do tasks.  They are trying to use reminders, smart phone alarms and other compensation techniques.  At PCP visit on 12/01/2022, concern for possible mild dementia was raised due to family history in his mother.  He was started on galantamine and had phosphorylated tau testing done but not resulted yet.    REVIEW OF SYSTEMS: Full 14 system review of systems performed and negative with exception of: as per HPI.  ALLERGIES: No Known Allergies  HOME MEDICATIONS: Outpatient Medications Prior to Visit  Medication Sig Dispense Refill   ezetimibe (ZETIA) 10 MG tablet Take 10 mg by mouth daily.     galantamine (RAZADYNE ER) 16 MG 24 hr capsule Take 16 mg by mouth daily with breakfast.     inclisiran (LEQVIO ) 284 MG/1.5ML SOSY injection Inject 1.5ml every 6 months 1.5 mL 0   metoprolol  succinate (TOPROL -XL) 50 MG 24 hr tablet TAKE ONE TABLET AT BEDTIME WITH OR IMMEDIATELY FOLLOWING A MEAL 90 tablet 3   aspirin  EC 81 MG tablet Take 81 mg by mouth daily. Swallow whole. (Patient not taking: Reported on 02/26/2024)     traMADol  (ULTRAM ) 50 MG tablet Take 1-2 tablets (50-100 mg total) by mouth every 6 (six) hours as needed for moderate pain (pain score 4-6) or severe pain (pain score 7-10). (Patient not taking: Reported on 02/26/2024) 20 tablet 0   zolpidem  (AMBIEN ) 10 MG tablet Take 5-10 mg by mouth at bedtime as needed for sleep. (Patient not taking: Reported on 02/26/2024)  No facility-administered medications prior to visit.    PAST MEDICAL HISTORY: Past Medical History:  Diagnosis Date   Alzheimer disease (HCC)    Arthritis    Asthma    Heart murmur    Hyperlipidemia    Hypertension    Mitral regurgitation    MVP (mitral valve prolapse)    OSA (obstructive sleep apnea)    AHI of 6.78/hr and O2 sats as low as 84%.  His OSA was moderate during REM sleep at 15.4/hr and 10.6/hr during supine sleep   Presence of permanent cardiac pacemaker    Snoring 09/07/2019   Syncope      PAST SURGICAL HISTORY: Past Surgical History:  Procedure Laterality Date   BUBBLE STUDY  05/23/2019   Procedure: BUBBLE STUDY;  Surgeon: [name], MD;  Location: [facility] ENDOSCOPY;  Service: Cardiovascular;;   CATARACT EXTRACTION, BILATERAL Bilateral    HERNIA REPAIR     INGUINAL HERNIA REPAIR Left 09/18/2023   Procedure: REPAIR, HERNIA, INGUINAL, LAPAROSCOPIC;  Surgeon: Sheldon Standing, MD;  Location: [facility] ORS;  Service: General;  Laterality: Left;  LAPAROSCOPIC LEFT INGUINAL AND RIGHT FEMORAL HERNIA REPAIRS   MITRAL VALVE REPAIR Right 06/14/2019   Procedure: MINIMALLY INVASIVE MITRAL VALVE REPAIR (MVR)<TEE;  Surgeon: [name], MD;  Location: [facility] OR;  Service: Open Heart Surgery;  Laterality: Right;   PACEMAKER IMPLANT N/A 10/12/2017   Procedure: PACEMAKER IMPLANT;  Surgeon: [name], MD;  Location: [facility] INVASIVE CV LAB;  Service: Cardiovascular;  Laterality: N/A;   RIGHT/LEFT HEART CATH AND CORONARY ANGIOGRAPHY N/A 06/06/2019   Procedure: RIGHT/LEFT HEART CATH AND CORONARY ANGIOGRAPHY;  Surgeon: [name], MD;  Location: [facility] INVASIVE CV LAB;  Service: Cardiovascular;  Laterality: N/A;   TEE WITHOUT CARDIOVERSION N/A 05/23/2019   Procedure: TRANSESOPHAGEAL ECHOCARDIOGRAM (TEE);  Surgeon: [name], MD;  Location: [facility] ENDOSCOPY;  Service: Cardiovascular;  Laterality: N/A;   TEE WITHOUT CARDIOVERSION N/A 06/14/2019   Procedure: TRANSESOPHAGEAL ECHOCARDIOGRAM (TEE);  Surgeon: [name], MD;  Location: [facility] OR;  Service: Open Heart Surgery;  Laterality: N/A;   TONSILECTOMY, ADENOIDECTOMY, BILATERAL MYRINGOTOMY AND TUBES     as child   TONSILLECTOMY     TOTAL KNEE ARTHROPLASTY Left    TOTAL KNEE ARTHROPLASTY Left 02/23/2019   Procedure: TOTAL KNEE ARTHROPLASTY;  Surgeon: [name], MD;  Location: [facility] ORS;  Service: Orthopedics;  Laterality: Left;     FAMILY HISTORY: Family History  Problem Relation Age of Onset   Alzheimer's  disease Mother    Hypertension Father     SOCIAL HISTORY: Social History   Socioeconomic History   Marital status: Married    Spouse name: Not on file   Number of children: Not on file   Years of education: Not on file   Highest education level: Not on file  Occupational History   Not on file  Tobacco Use   Smoking status: Never    Passive exposure: Never   Smokeless tobacco: Never  Vaping Use   Vaping status: Never Used  Substance and Sexual Activity   Alcohol  use: Not Currently    Alcohol /week: 5.0 standard drinks of alcohol     Types: 5 Glasses of wine per week   Drug use: No   Sexual activity: Not on file  Other Topics Concern   Not on file  Social History Narrative   Pt lives with wife at home   Retired    Social Drivers of Health   Tobacco Use: Low Risk (  02/26/2024)   Patient History    Smoking Tobacco Use: Never    Smokeless Tobacco Use: Never    Passive Exposure: Never  Financial Resource Strain: Not on file  Food Insecurity: Low Risk (01/21/2024)   Received from [facility]   Epic    Within the past 12 months, the food you bought just didn't last and you didn't have money to get more. : Never true    Within the past 12 months, you worried that your food would run out before you got money to buy more: Never true  Transportation Needs: No Transportation Needs (01/21/2024)   Received from [facility]    In the past 12 months, has lack of reliable transportation kept you from medical appointments, meetings, work or from getting things needed for daily living? : No  Physical Activity: Not on file  Stress: Not on file  Social Connections: Not on file  Intimate Partner Violence: Not on file  Depression (EYV7-0): Not on file  Alcohol  Screen: Not on file  Housing: Low Risk (01/21/2024)   Received from [facility]   Epic    What is your living situation today?: I have a steady place to live    Think about the place you live. Do you have  problems with any of the following? Choose all that apply:: None/None on this list  Utilities: Low Risk (01/21/2024)   Received from [facility]   Utilities    In the past 12 months has the electric, gas, oil, or water  company threatened to shut off services in your home? : No  Health Literacy: Not on file     PHYSICAL EXAM  Video visit  GENERAL EXAM/CONSTITUTIONAL: Vitals:  Vitals:   02/26/24 0921  BP: 106/62  Pulse: 63  Weight: 178 lb (80.7 kg)  Height: 5' 10 (1.778 m)    Body mass index is 25.54 kg/m. Wt Readings from Last 3 Encounters:  02/26/24 178 lb (80.7 kg)  12/01/23 173 lb (78.5 kg)  09/18/23 170 lb (77.1 kg)   Patient is in no distress; well developed, nourished and groomed; neck is supple  CARDIOVASCULAR: Examination of carotid arteries is normal; no carotid bruits Regular rate and rhythm, no murmurs Examination of peripheral vascular system by observation and palpation is normal  EYES: Ophthalmoscopic exam of optic discs and posterior segments is normal; no papilledema or hemorrhages No results found.  MUSCULOSKELETAL: Gait, strength, tone, movements noted in Neurologic exam below  NEUROLOGIC: MENTAL STATUS:     02/26/2024    9:28 AM 09/17/2023   10:13 AM 04/08/2023   10:10 AM  MMSE - Mini Mental State Exam  Orientation to time 4 4 2   Orientation to Place 4 4 4   Registration 3 3 3   Attention/ Calculation 4 1 4   Recall 0 1 2  Language- name 2 objects 2 2 2   Language- repeat 1 1 1   Language- follow 3 step command 2 3 3   Language- read & follow direction 1 1 1   Write a sentence 1 1 1   Copy design 1 1 0  Total score 23 22 23    awake, alert, oriented to person, place and time recent and remote memory intact normal attention and concentration language fluent, comprehension intact, naming intact fund of knowledge appropriate  CRANIAL NERVE:  2nd - no papilledema on fundoscopic exam 2nd, 3rd, 4th, 6th - pupils equal and reactive to light,  visual fields full to confrontation, extraocular muscles intact, no nystagmus  5th - facial sensation symmetric 7th - facial strength symmetric 8th - hearing intact 9th - palate elevates symmetrically, uvula midline 11th - shoulder shrug symmetric 12th - tongue protrusion midline  MOTOR:  normal bulk and tone, full strength in the BUE, BLE  SENSORY:  normal and symmetric to light touch, temperature, vibration  COORDINATION:  finger-nose-finger, fine finger movements normal  REFLEXES:  deep tendon reflexes present and symmetric  GAIT/STATION:  narrow based gait     DIAGNOSTIC DATA (LABS, IMAGING, TESTING) - I reviewed patient records, labs, notes, testing and imaging myself where available.  Lab Results  Component Value Date   WBC 7.6 09/16/2023   HGB 14.4 09/16/2023   HCT 43.1 09/16/2023   MCV 93.1 09/16/2023   PLT 196 09/16/2023      Component Value Date/Time   NA 138 09/16/2023 0943   NA 141 05/19/2019 1354   K 3.8 09/16/2023 0943   CL 104 09/16/2023 0943   CO2 26 09/16/2023 0943   GLUCOSE 113 (H) 09/16/2023 0943   BUN 23 09/16/2023 0943   BUN 20 05/19/2019 1354   CREATININE 1.25 (H) 09/16/2023 0943   CALCIUM  8.9 09/16/2023 0943   PROT 6.3 (L) 06/10/2019 1225   PROT 6.2 02/10/2019 1429   ALBUMIN  3.9 06/10/2019 1225   ALBUMIN  4.5 02/10/2019 1429   AST 26 06/10/2019 1225   ALT 18 06/10/2019 1225   ALKPHOS 67 06/10/2019 1225   BILITOT 0.4 06/10/2019 1225   BILITOT 0.5 02/10/2019 1429   GFRNONAA 57 (L) 09/16/2023 0943   GFRAA >60 06/19/2019 0332   Lab Results  Component Value Date   CHOL 169 02/10/2019   HDL 81 02/10/2019   LDLCALC 73 02/10/2019   TRIG 80 02/10/2019   CHOLHDL 2.1 02/10/2019   Lab Results  Component Value Date   HGBA1C 5.3 06/10/2019   Lab Results  Component Value Date   VITAMINB12 523 02/09/2023   Lab Results  Component Value Date   TSH 1.730 02/09/2023   02/09/23     Component Ref Range & Units 7 d ago  A --  Beta-amyloid 42/40 Ratio >0.102 0.106  Beta-amyloid 42 pg/mL 19.30  Beta-amyloid 40 pg/mL 181.72  T -- p-tau181 0.00 - 0.97 pg/mL 1.01 High   N -- NfL, Plasma 0.00 - 11.55 pg/mL 2.76  ATN SUMMARY Comment  Comment:                        A- T+ N- A high pTau181 concentration was observed. A normal beta-amyloid 42/40 ratio and normal concentration of NfL were observed at this time. These results are not consistent with the presence of Alzheimer's- related pathology, but may indicate pathology of another condition. Additional assessments may be necessary. These tests are intended to be used only in the context of clinical care.   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  12/16/22 MRI brain [I reviewed images myself and agree with interpretation. Total of 3 chronic cerebral microhemorrhages. -VRP]  - 2 mm focus of susceptibility-weighted signal loss within the anterior right frontal lobe, likely reflecting a chronic microhemorrhage. Additional punctate chronic microhemorrhages along the medial aspect of the left caudate nucleus and within the right cerebellar hemisphere. 1. 4 mm lesion within the posterior aspect of  the pituitary gland, as described. This may reflect a cyst or pituitary microadenoma. No further imaging evaluation or imaging follow-up is necessary. Correlate with history of pituitary hypersecretion. This follows ACR consensus guidelines: Management of Incidental Pituitary Findings on CT, MRI and F18-FDG PET: A White Paper of the ACR Incidental Findings Committee. J Am Coll Radiol 2018; 15: 966-72. 2. Mild chronic small ischemic changes within the cerebral white matter. 3. Mild generalized cerebral atrophy.   06/09/23 MRI Interval left frontal  ARIA-E and ARIA-H (superficial siderosis).   09/29/23 MRI (before infusion #1) 1. Mild temporal lobe and hippocampal atrophy 2. Microhemorrhages are unchanged 3. No areas of edema 4. Punctate acute lacunar infarct in the right parietal cortex. The punctate occipital lobe infarcts noted on the prior study have resolved  11/17/23 MRI brain (before infusion #2) 1. No acute intracranial abnormality. 2. No parenchymal edema. 3. Stable chronic microhemorrhages. 4. 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 5. Similar appearance of volume loss and chronic white matter changes.  12/01/23 MRI brain (before infusion #3) - Bilateral mesial temporal/hippocampal atrophy, right worse than left. - There are few small chronic microhemorrhages which are unchanged. - No edema.  01/12/24 MRI brain (before infusion #4) Small microhemorrhages in the right frontal lobe and right cerebellum are unchanged. No new microhemorrhages.   ASSESSMENT AND PLAN  83 y.o. year old male here with mild progressive short-term memory loss, recall difficulty, in last 6 to 12 months, with MMSE 27 out of 30.  However cognitive changes have somewhat rapidly changed according to wife and other family members, especially compared to his prior high functioning level.    Dx:  1. 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  Restarted Kisunla  (at [facility] infusion center) 10/21/23 (#1) 11/23/23 (#2) 12/21/23 (#3) 01/18/24 (#4) 02/15/24 (#5)  - modified gradual protocol: 1 vial (350 mg) for the first infusion, two vials (700 mg) for the second infusion, three vials (1,050 mg) for the third infusion, and four vials (1400 mg) per infusion thereafter.  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       EDUARD FABIENE HANLON, MD 02/26/2024, 10:35 AM Certified in Neurology, Neurophysiology and Neuroimaging  [facility] [address], [address] [place], [place] [address] [phone]  "

## 2024-02-26 NOTE — Progress Notes (Signed)
 Alexander [name]

## 2024-03-15 ENCOUNTER — Ambulatory Visit: Payer: Medicare Other

## 2024-03-15 DIAGNOSIS — I495 Sick sinus syndrome: Secondary | ICD-10-CM | POA: Diagnosis not present

## 2024-03-16 ENCOUNTER — Telehealth: Payer: Self-pay | Admitting: *Deleted

## 2024-03-16 DIAGNOSIS — G3184 Mild cognitive impairment, so stated: Secondary | ICD-10-CM

## 2024-03-16 NOTE — Telephone Encounter (Signed)
 Orders Placed This Encounter  Procedures   MR BRAIN WO CONTRAST   Alexander FABIENE HANLON, MD 03/16/2024, 10:42 AM Certified in Neurology, Neurophysiology and Neuroimaging  [facility] [address] [place], [place] [address] [phone]

## 2024-03-16 NOTE — Telephone Encounter (Signed)
 Kim with intrafusion noted and will call pt to come in 04-14-2024.

## 2024-03-16 NOTE — Telephone Encounter (Signed)
 I have Alexander [name] 2041-02-05 in for Kinsunla 6th dose 03-16-2024.SABRA.he will need an MRI done and read before 04/13/24.

## 2024-03-16 NOTE — Telephone Encounter (Signed)
 [place] messaged me that the next available MRI for a pacemaker patient is on 2/4 so they can schedule him for that day and mark it to be read stat and he will have to reschedule his infusion.

## 2024-03-17 ENCOUNTER — Ambulatory Visit: Payer: Self-pay | Admitting: Cardiology

## 2024-03-17 LAB — CUP PACEART REMOTE DEVICE CHECK
Battery Remaining Longevity: 87 mo
Battery Voltage: 2.98 V
Brady Statistic AP VP Percent: 0.03 %
Brady Statistic AP VS Percent: 91.86 %
Brady Statistic AS VP Percent: 0 %
Brady Statistic AS VS Percent: 8.1 %
Brady Statistic RA Percent Paced: 92.34 %
Brady Statistic RV Percent Paced: 0.04 %
Date Time Interrogation Session: 20260105221712
Implantable Lead Connection Status: 753985
Implantable Lead Connection Status: 753985
Implantable Lead Implant Date: 20190805
Implantable Lead Implant Date: 20190805
Implantable Lead Location: 753859
Implantable Lead Location: 753860
Implantable Lead Model: 5076
Implantable Lead Model: 5076
Implantable Pulse Generator Implant Date: 20190805
Lead Channel Impedance Value: 304 Ohm
Lead Channel Impedance Value: 323 Ohm
Lead Channel Impedance Value: 399 Ohm
Lead Channel Impedance Value: 418 Ohm
Lead Channel Pacing Threshold Amplitude: 0.625 V
Lead Channel Pacing Threshold Amplitude: 0.75 V
Lead Channel Pacing Threshold Pulse Width: 0.4 ms
Lead Channel Pacing Threshold Pulse Width: 0.4 ms
Lead Channel Sensing Intrinsic Amplitude: 3.25 mV
Lead Channel Sensing Intrinsic Amplitude: 3.25 mV
Lead Channel Sensing Intrinsic Amplitude: 4.75 mV
Lead Channel Sensing Intrinsic Amplitude: 4.75 mV
Lead Channel Setting Pacing Amplitude: 2 V
Lead Channel Setting Pacing Amplitude: 2.5 V
Lead Channel Setting Pacing Pulse Width: 0.4 ms
Lead Channel Setting Sensing Sensitivity: 1.2 mV
Zone Setting Status: 755011
Zone Setting Status: 755011

## 2024-03-21 ENCOUNTER — Other Ambulatory Visit: Payer: Self-pay | Admitting: Cardiovascular Disease

## 2024-03-21 NOTE — Progress Notes (Signed)
 Remote PPM Transmission

## 2024-04-07 NOTE — CV Procedure (Signed)
" °  Device system confirmed to be MRI conditional, with implant date > 6 weeks ago, and no evidence of abandoned or epicardial leads in review of most recent CXR  Device last cleared by EP Provider: Cleared 01/2024  Clearance is good through for 1 year as long as parameters remain stable at time of check. If pt undergoes a cardiac device procedure during that time, they should be re-cleared.   Tachy-therapies to be programmed off if applicable with device back to pre-MRI settings after completion of exam.  [employer] - Programming recommendation received through [employer] App/Tablet  Rocky Catalan, RT  04/07/2024 10:00 AM     "

## 2024-04-13 ENCOUNTER — Ambulatory Visit: Payer: Self-pay | Admitting: Diagnostic Neuroimaging

## 2024-04-13 ENCOUNTER — Ambulatory Visit (HOSPITAL_COMMUNITY)
Admission: RE | Admit: 2024-04-13 | Discharge: 2024-04-13 | Disposition: A | Source: Ambulatory Visit | Attending: Diagnostic Neuroimaging | Admitting: Diagnostic Neuroimaging

## 2024-04-13 ENCOUNTER — Telehealth: Payer: Self-pay | Admitting: Diagnostic Neuroimaging

## 2024-04-13 DIAGNOSIS — G3184 Mild cognitive impairment, so stated: Secondary | ICD-10-CM

## 2024-04-13 NOTE — Telephone Encounter (Signed)
 This pt is set for an infusion tomorrow and just had his MRI and would like someone here to call over there to make sure it is read STAT today by a radiologist so that he can have the infusion tomorrow. TY

## 2024-04-14 NOTE — Telephone Encounter (Signed)
 I called the patient to inform him that his MRI looks stable/ good and that he is able to get his infusion that is scheduled today.

## 2024-05-17 ENCOUNTER — Encounter (HOSPITAL_COMMUNITY)

## 2024-05-24 ENCOUNTER — Inpatient Hospital Stay (HOSPITAL_COMMUNITY): Admission: RE | Admit: 2024-05-24 | Source: Ambulatory Visit

## 2024-05-30 ENCOUNTER — Ambulatory Visit: Admitting: Diagnostic Neuroimaging
